# Patient Record
Sex: Male | Born: 1965
Health system: Southern US, Community
[De-identification: ages and names within clinical notes are randomized; demographics above are authoritative.]

## PROBLEM LIST (undated history)

## (undated) DIAGNOSIS — IMO0001 Reserved for inherently not codable concepts without codable children: Secondary | ICD-10-CM

## (undated) DIAGNOSIS — I739 Peripheral vascular disease, unspecified: Secondary | ICD-10-CM

## (undated) DIAGNOSIS — M1990 Unspecified osteoarthritis, unspecified site: Secondary | ICD-10-CM

## (undated) DIAGNOSIS — K219 Gastro-esophageal reflux disease without esophagitis: Secondary | ICD-10-CM

## (undated) DIAGNOSIS — I10 Essential (primary) hypertension: Secondary | ICD-10-CM

## (undated) DIAGNOSIS — T82898A Other specified complication of vascular prosthetic devices, implants and grafts, initial encounter: Secondary | ICD-10-CM

## (undated) DIAGNOSIS — T7840XA Allergy, unspecified, initial encounter: Secondary | ICD-10-CM

## (undated) DIAGNOSIS — K226 Gastro-esophageal laceration-hemorrhage syndrome: Secondary | ICD-10-CM

## (undated) DIAGNOSIS — F1910 Other psychoactive substance abuse, uncomplicated: Secondary | ICD-10-CM

## (undated) DIAGNOSIS — S82202A Unspecified fracture of shaft of left tibia, initial encounter for closed fracture: Secondary | ICD-10-CM

## (undated) DIAGNOSIS — S81839A Puncture wound without foreign body, unspecified lower leg, initial encounter: Secondary | ICD-10-CM

## (undated) DIAGNOSIS — K222 Esophageal obstruction: Secondary | ICD-10-CM

## (undated) DIAGNOSIS — W3400XA Accidental discharge from unspecified firearms or gun, initial encounter: Secondary | ICD-10-CM

## (undated) DIAGNOSIS — J45909 Unspecified asthma, uncomplicated: Secondary | ICD-10-CM

## (undated) HISTORY — PX: FEMUR FRACTURE SURGERY: SHX633

## (undated) HISTORY — DX: Other psychoactive substance abuse, uncomplicated: F19.10

## (undated) HISTORY — DX: Unspecified fracture of shaft of left tibia, initial encounter for closed fracture: S82.202A

## (undated) HISTORY — DX: Allergy, unspecified, initial encounter: T78.40XA

## (undated) HISTORY — DX: Other specified complication of vascular prosthetic devices, implants and grafts, initial encounter: T82.898A

## (undated) HISTORY — DX: Esophageal obstruction: K22.2

## (undated) HISTORY — PX: MYRINGOTOMY: SUR874

## (undated) HISTORY — PX: APPENDECTOMY: SHX54

## (undated) HISTORY — PX: UPPER GASTROINTESTINAL ENDOSCOPY: SHX188

## (undated) HISTORY — PX: TONSILLECTOMY: SUR1361

## (undated) HISTORY — DX: Puncture wound without foreign body, unspecified lower leg, initial encounter: S81.839A

## (undated) HISTORY — DX: Accidental discharge from unspecified firearms or gun, initial encounter: W34.00XA

---
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
## 1998-09-01 ENCOUNTER — Emergency Department (HOSPITAL_COMMUNITY): Admission: EM | Admit: 1998-09-01 | Discharge: 1998-09-01 | Payer: Self-pay | Admitting: Emergency Medicine

## 1998-09-02 ENCOUNTER — Encounter: Payer: Self-pay | Admitting: Emergency Medicine

## 2001-02-07 ENCOUNTER — Emergency Department (HOSPITAL_COMMUNITY): Admission: EM | Admit: 2001-02-07 | Discharge: 2001-02-07 | Payer: Self-pay

## 2001-06-07 ENCOUNTER — Emergency Department (HOSPITAL_COMMUNITY): Admission: EM | Admit: 2001-06-07 | Discharge: 2001-06-07 | Payer: Self-pay | Admitting: Emergency Medicine

## 2001-06-07 ENCOUNTER — Encounter: Payer: Self-pay | Admitting: Emergency Medicine

## 2001-06-23 ENCOUNTER — Emergency Department (HOSPITAL_COMMUNITY): Admission: EM | Admit: 2001-06-23 | Discharge: 2001-06-23 | Payer: Self-pay | Admitting: Emergency Medicine

## 2001-06-23 ENCOUNTER — Encounter: Payer: Self-pay | Admitting: Emergency Medicine

## 2002-05-30 ENCOUNTER — Encounter: Payer: Self-pay | Admitting: Emergency Medicine

## 2002-05-30 ENCOUNTER — Emergency Department (HOSPITAL_COMMUNITY): Admission: EM | Admit: 2002-05-30 | Discharge: 2002-05-30 | Payer: Self-pay | Admitting: Emergency Medicine

## 2003-04-12 ENCOUNTER — Inpatient Hospital Stay (HOSPITAL_COMMUNITY): Admission: EM | Admit: 2003-04-12 | Discharge: 2003-04-14 | Payer: Self-pay | Admitting: Emergency Medicine

## 2003-04-15 ENCOUNTER — Emergency Department (HOSPITAL_COMMUNITY): Admission: EM | Admit: 2003-04-15 | Discharge: 2003-04-15 | Payer: Self-pay | Admitting: Emergency Medicine

## 2008-02-20 ENCOUNTER — Emergency Department (HOSPITAL_COMMUNITY): Admission: EM | Admit: 2008-02-20 | Discharge: 2008-02-20 | Payer: Self-pay | Admitting: Emergency Medicine

## 2009-03-15 ENCOUNTER — Emergency Department (HOSPITAL_COMMUNITY): Admission: EM | Admit: 2009-03-15 | Discharge: 2009-03-15 | Payer: Self-pay | Admitting: Emergency Medicine

## 2010-06-14 ENCOUNTER — Emergency Department (HOSPITAL_COMMUNITY)
Admission: EM | Admit: 2010-06-14 | Discharge: 2010-06-14 | Disposition: A | Discharge status: Home / Self Care | Payer: Self-pay | Attending: Emergency Medicine | Admitting: Emergency Medicine

## 2010-06-14 ENCOUNTER — Emergency Department (HOSPITAL_COMMUNITY): Payer: Self-pay

## 2010-06-14 DIAGNOSIS — Y92009 Unspecified place in unspecified non-institutional (private) residence as the place of occurrence of the external cause: Secondary | ICD-10-CM | POA: Insufficient documentation

## 2010-06-14 DIAGNOSIS — X500XXA Overexertion from strenuous movement or load, initial encounter: Secondary | ICD-10-CM | POA: Insufficient documentation

## 2010-06-14 DIAGNOSIS — M25569 Pain in unspecified knee: Secondary | ICD-10-CM | POA: Insufficient documentation

## 2010-06-14 DIAGNOSIS — S8990XA Unspecified injury of unspecified lower leg, initial encounter: Secondary | ICD-10-CM | POA: Insufficient documentation

## 2010-06-14 DIAGNOSIS — S99929A Unspecified injury of unspecified foot, initial encounter: Secondary | ICD-10-CM | POA: Insufficient documentation

## 2010-08-14 LAB — COMPREHENSIVE METABOLIC PANEL
ALT: 22 U/L (ref 0–53)
AST: 22 U/L (ref 0–37)
Albumin: 3.8 g/dL (ref 3.5–5.2)
Alkaline Phosphatase: 77 U/L (ref 39–117)
BUN: 6 mg/dL (ref 6–23)
CO2: 21 mEq/L (ref 19–32)
Calcium: 9.3 mg/dL (ref 8.4–10.5)
Chloride: 110 mEq/L (ref 96–112)
Creatinine, Ser: 0.91 mg/dL (ref 0.4–1.5)
GFR calc Af Amer: >60 mL/min (ref >60)
GFR calc non Af Amer: >60 mL/min (ref >60)
Glucose, Bld: 104 mg/dL — ABNORMAL HIGH (ref 70–99)
Potassium: 3.9 mEq/L (ref 3.5–5.1)
Sodium: 139 mEq/L (ref 135–145)
Total Bilirubin: 0.3 mg/dL (ref 0.3–1.2)
Total Protein: 7 g/dL (ref 6.0–8.3)

## 2010-08-14 LAB — CBC
HCT: 46.8 % (ref 39.0–52.0)
Hemoglobin: 16.3 g/dL (ref 13.0–17.0)
MCHC: 34.8 g/dL (ref 30.0–36.0)
MCV: 90.5 fL (ref 78.0–100.0)
Platelets: 321 10*3/uL (ref 150–400)
RBC: 5.18 MIL/uL (ref 4.22–5.81)
RDW: 13.5 % (ref 11.5–15.5)
WBC: 11.6 10*3/uL — ABNORMAL HIGH (ref 4.0–10.5)

## 2010-08-14 LAB — DIFFERENTIAL
Basophils Absolute: 0.1 10*3/uL (ref 0.0–0.1)
Basophils Relative: 1 % (ref 0–1)
Eosinophils Absolute: 0.9 10*3/uL — ABNORMAL HIGH (ref 0.0–0.7)
Eosinophils Relative: 8 % — ABNORMAL HIGH (ref 0–5)
Lymphocytes Relative: 30 % (ref 12–46)
Lymphs Abs: 3.4 10*3/uL (ref 0.7–4.0)
Monocytes Absolute: 1.1 10*3/uL — ABNORMAL HIGH (ref 0.1–1.0)
Monocytes Relative: 9 % (ref 3–12)
Neutro Abs: 6.1 10*3/uL (ref 1.7–7.7)
Neutrophils Relative %: 53 % (ref 43–77)

## 2010-08-14 LAB — LIPASE, BLOOD: Lipase: 18 U/L (ref 11–59)

## 2010-09-09 ENCOUNTER — Emergency Department (HOSPITAL_COMMUNITY)
Admission: EM | Admit: 2010-09-09 | Discharge: 2010-09-09 | Disposition: A | Discharge status: Home / Self Care | Payer: Self-pay | Attending: Emergency Medicine | Admitting: Emergency Medicine

## 2010-09-09 DIAGNOSIS — X500XXA Overexertion from strenuous movement or load, initial encounter: Secondary | ICD-10-CM | POA: Insufficient documentation

## 2010-09-09 DIAGNOSIS — M25469 Effusion, unspecified knee: Secondary | ICD-10-CM | POA: Insufficient documentation

## 2010-09-09 DIAGNOSIS — M25569 Pain in unspecified knee: Secondary | ICD-10-CM | POA: Insufficient documentation

## 2010-09-09 DIAGNOSIS — I10 Essential (primary) hypertension: Secondary | ICD-10-CM | POA: Insufficient documentation

## 2010-09-27 NOTE — H&P (Signed)
NAME:  WILBERN, PENNYPACKER                           ACCOUNT NO.:  [id number]   MEDICAL RECORD NO.:  [id number]                   PATIENT TYPE:  EMS   LOCATION:  [facility]                                 FACILITY:  [facility]   PHYSICIAN:  Sherin Quarry, MD                   DATE OF BIRTH:  08/17/1965   DATE OF ADMISSION:  04/12/2003  DATE OF DISCHARGE:                                HISTORY & PHYSICAL   HISTORY OF PRESENT ILLNESS:  Vincent Clarke is a 45 year old man who reports  that he has recently been under a lot of stress due to his marital situation  and also many bills.  At about 6 p.m. the evening prior to the morning of  admission, the patient became angry and took an overdose of his wife's  Dilantin as well as a half a bottle of Tylenol PM.  It is not clear how much  Dilantin he took.  A Dilantin level was obtained and was negligible, but a  Tylenol level was measured 10 hours after the time that he theoretically  took the overdose and was 126.  If this was in fact 10 hours after the  ingestion, this level is considered toxic and fulfills the criteria for  initiating Mucomyst therapy, therefore, the patient was given his first dose  of Mucomyst in the emergency room.  He is admitted to receive the subsequent  17 doses.   PAST MEDICAL HISTORY:   MEDICINES:  None.   ILLNESSES:  None.   OPERATIONS:  The patient had a fracture left leg as a teenager and was  treated with traction for six weeks.   FAMILY HISTORY:  The patient's father died of COPD in Sep 24, 2000.  The mother has  a history of rheumatoid arthritis.  He has a sister who is in good health.   SOCIAL HISTORY:  He smokes one and a half packs of cigarettes per day.  He  drinks two to three beers some weekends.  He denies alcohol abuse.   REVIEW OF SYSTEMS:  HEAD:  He denies headache or dizziness.  EYES:  He  denies visual blurring or diplopia.  EARS, NOSE AND THROAT:  Denies earache,  sinus pain or sore throat.  CHEST:  Denies  coughing, wheezing or chronic  constipation.  CARDIOVASCULAR:  Denies orthopnea, PND or ankle edema.  GI:  He has occasional complaints of indigestion.  He denies vomiting, melena or  hematochezia.  GU:  He denies dysuria or urinary frequency, hesitancy or  nocturia.  RHEUMATOLOGIC:  He denies back pain or joint pain.  HEMATOLOGIC:  He denies easy bleeding or bruising.  NEUROLOGIC:  He denies history of  seizure or stroke.   PHYSICAL EXAMINATION:  GENERAL:  On physical exam, he is alert and  cooperative and is in no acute distress.  HEENT:  Exam is within normal limits.  CHEST:  The chest  is clear to auscultation.  BACK:  Examination of the back reveals no CVA or point tenderness.  CARDIOVASCULAR:  Exam reveals normal S1 and S2 without rubs, murmurs or  gallops.  ABDOMEN:  The abdomen is benign, minimal bowel sounds, without masses,  tenderness or organomegaly.  NEUROLOGIC/EXTREMITIES:  Neurologic testing and examination of extremities  are normal.   IMPRESSION:  1. Overdose of Tylenol requiring treatment.  2. Presumed depression.  3. History of left leg fracture.   PLAN:  The patient will be given 140 mg/kg of N-acetyl cysteine, or  Mucomyst, x1.  He will then be given 17 doses of 70 mg/kg of Mucomyst every  4 hours.  Psychiatric consult will be needed from Dr. Antonietta Breach to  evaluate suicide risk.                                                Sherin Quarry, MD    SY/MEDQ  D:  04/12/2003  T:  04/12/2003  Job:  [id number]

## 2010-09-27 NOTE — Discharge Summary (Signed)
NAME:  Vincent Clarke, Vincent Clarke                           ACCOUNT NO.:  [id number]   MEDICAL RECORD NO.:  [id number]                   PATIENT TYPE:  INP   LOCATION:  5734                                 FACILITY:  [facility]   PHYSICIAN:  Hollice Espy, M.D.            DATE OF BIRTH:  12-May-1966   DATE OF ADMISSION:  04/12/2003  DATE OF DISCHARGE:  04/14/2003                                 DISCHARGE SUMMARY   HOSPITAL SUMMARY:  This is a 45 year old white male with no past medical  history who presents to the emergency room after he tried to as a suicide  attempt take his wife's Dilantin and a bottle of Tylenol.  Dilantin level  was negligible, but his Tylenol level was 126 which was considered toxic.  The exact date of his consumption was not known, but after discussion with  poison control, the level could be considered toxic. He was given his first  dose of Mucomyst and it was felt that he would need to be on the round-the-  clock Mucomyst for a total of 17 doses every four hours.  The patient  initially had some earlier abdominal pain, but that resolved and through his  hospital course he continued to receive Mucomyst doses.  He had a sitter  waiting with him and the patient remained stable.  However, after discussion  with poison control, they were able to stop his  Mucomyst early.  In waiting  for psychiatry clearance, the patient decided that he could not wait any  longer and decided to leave against medical advice or actually against what  was allowed.  The patient's wife was with him and did not encourage him to  stay and he waited until nurses were not looking and soon the patient was  gone from the room.  At this point, nursing call me and informed me at  approximately 3 p.m. on the date of April 14, 2003.  The patient was not  stable from a medical standpoint given that he could be at risk for harming  himself and the police department was notified.  At that time since the  patient  had not been voluntarily committed by psychiatry, emergency  treatment papers were signed and notarized, then the police department was  notified for patient to be picked up.   ADDENDUM:  The police were able to pick up this patient and since given that  he was medically cleared, I think they initially brought him to the  emergency room.  He was then transferred to a mental health facility for  further psychiatric therapy.                                                Hollice Espy, M.D.    SKK/MEDQ  D:  04/30/2003  T:  05/01/2003  Job:  [id number]

## 2010-10-21 ENCOUNTER — Emergency Department (HOSPITAL_COMMUNITY): Payer: Self-pay

## 2010-10-21 ENCOUNTER — Emergency Department (HOSPITAL_COMMUNITY)
Admission: EM | Admit: 2010-10-21 | Discharge: 2010-10-21 | Disposition: A | Discharge status: Home / Self Care | Payer: Self-pay | Attending: Emergency Medicine | Admitting: Emergency Medicine

## 2010-10-21 DIAGNOSIS — M545 Low back pain, unspecified: Secondary | ICD-10-CM | POA: Insufficient documentation

## 2010-10-21 DIAGNOSIS — R209 Unspecified disturbances of skin sensation: Secondary | ICD-10-CM | POA: Insufficient documentation

## 2010-10-21 DIAGNOSIS — R109 Unspecified abdominal pain: Secondary | ICD-10-CM | POA: Insufficient documentation

## 2010-10-21 DIAGNOSIS — M79609 Pain in unspecified limb: Secondary | ICD-10-CM | POA: Insufficient documentation

## 2010-10-21 DIAGNOSIS — I10 Essential (primary) hypertension: Secondary | ICD-10-CM | POA: Insufficient documentation

## 2010-10-21 LAB — URINALYSIS, ROUTINE W REFLEX MICROSCOPIC
Bilirubin Urine: NEGATIVE
Glucose, UA: NEGATIVE mg/dL
Hgb urine dipstick: NEGATIVE
Ketones, ur: NEGATIVE mg/dL
Leukocytes, UA: NEGATIVE
Nitrite: NEGATIVE
Protein, ur: NEGATIVE mg/dL
Specific Gravity, Urine: 1.009 (ref 1.005–1.030)
Urobilinogen, UA: 0.2 mg/dL (ref 0.0–1.0)
pH: 7.5 (ref 5.0–8.0)

## 2010-12-15 ENCOUNTER — Emergency Department (HOSPITAL_COMMUNITY)
Admission: EM | Admit: 2010-12-15 | Discharge: 2010-12-15 | Disposition: A | Discharge status: Home / Self Care | Payer: Self-pay | Attending: Emergency Medicine | Admitting: Emergency Medicine

## 2010-12-15 ENCOUNTER — Emergency Department (HOSPITAL_COMMUNITY): Payer: Self-pay

## 2010-12-15 DIAGNOSIS — R071 Chest pain on breathing: Secondary | ICD-10-CM | POA: Insufficient documentation

## 2010-12-15 DIAGNOSIS — M545 Low back pain, unspecified: Secondary | ICD-10-CM | POA: Insufficient documentation

## 2010-12-15 DIAGNOSIS — R05 Cough: Secondary | ICD-10-CM | POA: Insufficient documentation

## 2010-12-15 DIAGNOSIS — R059 Cough, unspecified: Secondary | ICD-10-CM | POA: Insufficient documentation

## 2010-12-15 DIAGNOSIS — R51 Headache: Secondary | ICD-10-CM | POA: Insufficient documentation

## 2010-12-15 DIAGNOSIS — R062 Wheezing: Secondary | ICD-10-CM | POA: Insufficient documentation

## 2010-12-15 DIAGNOSIS — J9801 Acute bronchospasm: Secondary | ICD-10-CM | POA: Insufficient documentation

## 2010-12-15 DIAGNOSIS — J40 Bronchitis, not specified as acute or chronic: Secondary | ICD-10-CM | POA: Insufficient documentation

## 2010-12-15 DIAGNOSIS — I10 Essential (primary) hypertension: Secondary | ICD-10-CM | POA: Insufficient documentation

## 2011-01-27 ENCOUNTER — Emergency Department (HOSPITAL_COMMUNITY)
Admission: EM | Admit: 2011-01-27 | Discharge: 2011-01-27 | Disposition: A | Discharge status: Home / Self Care | Payer: Self-pay | Attending: Emergency Medicine | Admitting: Emergency Medicine

## 2011-01-27 DIAGNOSIS — M549 Dorsalgia, unspecified: Secondary | ICD-10-CM | POA: Insufficient documentation

## 2011-01-27 DIAGNOSIS — M5430 Sciatica, unspecified side: Secondary | ICD-10-CM | POA: Insufficient documentation

## 2011-01-27 DIAGNOSIS — I10 Essential (primary) hypertension: Secondary | ICD-10-CM | POA: Insufficient documentation

## 2011-02-10 LAB — DIFFERENTIAL
Basophils Absolute: 0.2 — ABNORMAL HIGH
Basophils Relative: 1
Eosinophils Absolute: 0.6
Eosinophils Relative: 3
Lymphocytes Relative: 19
Lymphs Abs: 3.7
Monocytes Absolute: 1.6 — ABNORMAL HIGH
Monocytes Relative: 8
Neutro Abs: 13.5 — ABNORMAL HIGH
Neutrophils Relative %: 69

## 2011-02-10 LAB — CBC
HCT: 49.2
Hemoglobin: 17
MCHC: 34.5
MCV: 89.3
Platelets: 347
RBC: 5.51
RDW: 13.2
WBC: 19.6 — ABNORMAL HIGH

## 2011-02-10 LAB — D-DIMER, QUANTITATIVE: D-Dimer, Quant: 0.29

## 2011-02-10 LAB — POCT I-STAT, CHEM 8
BUN: 3 — ABNORMAL LOW
Calcium, Ion: 1.14
Chloride: 109
Creatinine, Ser: 1
Glucose, Bld: 85
HCT: 50
Hemoglobin: 17
Potassium: 3.6
Sodium: 139
TCO2: 22

## 2011-02-10 LAB — POCT CARDIAC MARKERS
CKMB, poc: <1 — ABNORMAL LOW
CKMB, poc: <1 — ABNORMAL LOW
Myoglobin, poc: 56.2
Myoglobin, poc: 61
Troponin i, poc: <0.05
Troponin i, poc: <0.05

## 2011-03-02 ENCOUNTER — Emergency Department (HOSPITAL_COMMUNITY)
Admission: EM | Admit: 2011-03-02 | Discharge: 2011-03-02 | Disposition: A | Discharge status: Home / Self Care | Payer: Self-pay | Attending: Emergency Medicine | Admitting: Emergency Medicine

## 2011-03-02 ENCOUNTER — Emergency Department (HOSPITAL_COMMUNITY): Payer: Self-pay

## 2011-03-02 DIAGNOSIS — M25569 Pain in unspecified knee: Secondary | ICD-10-CM | POA: Insufficient documentation

## 2011-03-02 DIAGNOSIS — I10 Essential (primary) hypertension: Secondary | ICD-10-CM | POA: Insufficient documentation

## 2011-03-02 DIAGNOSIS — M25469 Effusion, unspecified knee: Secondary | ICD-10-CM | POA: Insufficient documentation

## 2011-03-08 ENCOUNTER — Emergency Department (HOSPITAL_COMMUNITY): Payer: Self-pay

## 2011-03-08 ENCOUNTER — Emergency Department (HOSPITAL_COMMUNITY)
Admission: EM | Admit: 2011-03-08 | Discharge: 2011-03-08 | Disposition: A | Discharge status: Home / Self Care | Payer: Self-pay | Attending: Emergency Medicine | Admitting: Emergency Medicine

## 2011-03-08 DIAGNOSIS — I10 Essential (primary) hypertension: Secondary | ICD-10-CM | POA: Insufficient documentation

## 2011-03-08 DIAGNOSIS — S0003XA Contusion of scalp, initial encounter: Secondary | ICD-10-CM | POA: Insufficient documentation

## 2011-03-08 DIAGNOSIS — S0280XA Fracture of other specified skull and facial bones, unspecified side, initial encounter for closed fracture: Secondary | ICD-10-CM | POA: Insufficient documentation

## 2011-03-08 DIAGNOSIS — M25469 Effusion, unspecified knee: Secondary | ICD-10-CM | POA: Insufficient documentation

## 2011-03-08 DIAGNOSIS — IMO0002 Reserved for concepts with insufficient information to code with codable children: Secondary | ICD-10-CM | POA: Insufficient documentation

## 2011-03-08 DIAGNOSIS — S02401A Maxillary fracture, unspecified, initial encounter for closed fracture: Secondary | ICD-10-CM | POA: Insufficient documentation

## 2011-03-08 DIAGNOSIS — S02400A Malar fracture unspecified, initial encounter for closed fracture: Secondary | ICD-10-CM | POA: Insufficient documentation

## 2011-03-08 DIAGNOSIS — Y92009 Unspecified place in unspecified non-institutional (private) residence as the place of occurrence of the external cause: Secondary | ICD-10-CM | POA: Insufficient documentation

## 2011-03-08 DIAGNOSIS — S1093XA Contusion of unspecified part of neck, initial encounter: Secondary | ICD-10-CM | POA: Insufficient documentation

## 2011-03-08 LAB — DIFFERENTIAL
Basophils Absolute: 0 10*3/uL (ref 0.0–0.1)
Basophils Relative: 0 % (ref 0–1)
Eosinophils Absolute: 0 10*3/uL (ref 0.0–0.7)
Eosinophils Relative: 0 % (ref 0–5)
Lymphocytes Relative: 16 % (ref 12–46)
Lymphs Abs: 3.4 10*3/uL (ref 0.7–4.0)
Monocytes Absolute: 2.3 10*3/uL — ABNORMAL HIGH (ref 0.1–1.0)
Monocytes Relative: 11 % (ref 3–12)
Neutro Abs: 15.6 10*3/uL — ABNORMAL HIGH (ref 1.7–7.7)
Neutrophils Relative %: 73 % (ref 43–77)

## 2011-03-08 LAB — CBC
HCT: 40.8 % (ref 39.0–52.0)
Hemoglobin: 14.8 g/dL (ref 13.0–17.0)
MCH: 31.5 pg (ref 26.0–34.0)
MCHC: 36.3 g/dL — ABNORMAL HIGH (ref 30.0–36.0)
MCV: 86.8 fL (ref 78.0–100.0)
Platelets: 353 10*3/uL (ref 150–400)
RBC: 4.7 MIL/uL (ref 4.22–5.81)
RDW: 13.7 % (ref 11.5–15.5)
WBC: 21.5 10*3/uL — ABNORMAL HIGH (ref 4.0–10.5)

## 2011-03-08 LAB — POCT I-STAT, CHEM 8
BUN: 5 mg/dL — ABNORMAL LOW (ref 6–23)
Calcium, Ion: 1.1 mmol/L — ABNORMAL LOW (ref 1.12–1.32)
Chloride: 105 mEq/L (ref 96–112)
Creatinine, Ser: 0.8 mg/dL (ref 0.50–1.35)
Glucose, Bld: 102 mg/dL — ABNORMAL HIGH (ref 70–99)
HCT: 45 % (ref 39.0–52.0)
Hemoglobin: 15.3 g/dL (ref 13.0–17.0)
Potassium: 3 mEq/L — ABNORMAL LOW (ref 3.5–5.1)
Sodium: 142 mEq/L (ref 135–145)
TCO2: 21 mmol/L (ref 0–100)

## 2011-03-08 LAB — RAPID URINE DRUG SCREEN, HOSP PERFORMED
Amphetamines: NOT DETECTED
Barbiturates: NOT DETECTED
Benzodiazepines: NOT DETECTED
Cocaine: POSITIVE — AB
Opiates: POSITIVE — AB
Tetrahydrocannabinol: POSITIVE — AB

## 2011-03-08 MED ORDER — IOHEXOL 300 MG/ML  SOLN: 100.0000 mL | Freq: Once | INTRAMUSCULAR | Status: DC | PRN

## 2011-03-09 LAB — SAMPLE TO BLOOD BANK
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
## 2011-07-17 ENCOUNTER — Emergency Department (HOSPITAL_COMMUNITY)
Admission: EM | Admit: 2011-07-17 | Discharge: 2011-07-17 | Disposition: A | Discharge status: Home Health | Payer: Self-pay | Attending: Emergency Medicine | Admitting: Emergency Medicine

## 2011-07-17 ENCOUNTER — Emergency Department (HOSPITAL_COMMUNITY): Payer: Self-pay

## 2011-07-17 ENCOUNTER — Encounter (HOSPITAL_COMMUNITY): Payer: Self-pay

## 2011-07-17 DIAGNOSIS — Z79899 Other long term (current) drug therapy: Secondary | ICD-10-CM | POA: Insufficient documentation

## 2011-07-17 DIAGNOSIS — S199XXA Unspecified injury of neck, initial encounter: Secondary | ICD-10-CM | POA: Insufficient documentation

## 2011-07-17 DIAGNOSIS — H5710 Ocular pain, unspecified eye: Secondary | ICD-10-CM | POA: Insufficient documentation

## 2011-07-17 DIAGNOSIS — H538 Other visual disturbances: Secondary | ICD-10-CM | POA: Insufficient documentation

## 2011-07-17 DIAGNOSIS — IMO0002 Reserved for concepts with insufficient information to code with codable children: Secondary | ICD-10-CM | POA: Insufficient documentation

## 2011-07-17 DIAGNOSIS — I10 Essential (primary) hypertension: Secondary | ICD-10-CM | POA: Insufficient documentation

## 2011-07-17 DIAGNOSIS — S0510XA Contusion of eyeball and orbital tissues, unspecified eye, initial encounter: Secondary | ICD-10-CM | POA: Insufficient documentation

## 2011-07-17 DIAGNOSIS — S0993XA Unspecified injury of face, initial encounter: Secondary | ICD-10-CM | POA: Insufficient documentation

## 2011-07-17 HISTORY — DX: Essential (primary) hypertension: I10

## 2011-07-17 MED ORDER — OXYCODONE-ACETAMINOPHEN 5-325 MG PO TABS: 1.0000 | ORAL_TABLET | Freq: Three times a day (TID) | ORAL | Status: AC | PRN

## 2011-07-17 MED ORDER — OXYCODONE-ACETAMINOPHEN 5-325 MG PO TABS
1.0000 | ORAL_TABLET | Freq: Once | ORAL | Status: AC
  Administered 2011-07-17: 1 via ORAL
  Filled 2011-07-17: qty 1

## 2011-07-17 NOTE — ED Notes (Signed)
Pt states that he was helping his friend hang some shutters and it ended up slipping and hitting him in the right side of his head. No loc but pt states that he has a headache and his vision in the right eye feels blurry. Pt states that when walking backwards he also tripped and feels like he also injured his back. Alert and oriented. Aleve pta with no relief.
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
## 2011-09-08 ENCOUNTER — Emergency Department (HOSPITAL_COMMUNITY)
Admission: EM | Admit: 2011-09-08 | Discharge: 2011-09-08 | Disposition: A | Discharge status: Home / Self Care | Payer: Self-pay | Attending: Emergency Medicine | Admitting: Emergency Medicine

## 2011-09-08 ENCOUNTER — Emergency Department (HOSPITAL_COMMUNITY): Payer: Self-pay

## 2011-09-08 ENCOUNTER — Encounter (HOSPITAL_COMMUNITY): Payer: Self-pay | Admitting: Emergency Medicine

## 2011-09-08 DIAGNOSIS — W2209XA Striking against other stationary object, initial encounter: Secondary | ICD-10-CM | POA: Insufficient documentation

## 2011-09-08 DIAGNOSIS — T148XXA Other injury of unspecified body region, initial encounter: Secondary | ICD-10-CM | POA: Insufficient documentation

## 2011-09-08 DIAGNOSIS — M7989 Other specified soft tissue disorders: Secondary | ICD-10-CM | POA: Insufficient documentation

## 2011-09-08 DIAGNOSIS — Z79899 Other long term (current) drug therapy: Secondary | ICD-10-CM | POA: Insufficient documentation

## 2011-09-08 DIAGNOSIS — IMO0001 Reserved for inherently not codable concepts without codable children: Secondary | ICD-10-CM | POA: Insufficient documentation

## 2011-09-08 DIAGNOSIS — M549 Dorsalgia, unspecified: Secondary | ICD-10-CM | POA: Insufficient documentation

## 2011-09-08 DIAGNOSIS — M79609 Pain in unspecified limb: Secondary | ICD-10-CM | POA: Insufficient documentation

## 2011-09-08 DIAGNOSIS — I10 Essential (primary) hypertension: Secondary | ICD-10-CM | POA: Insufficient documentation

## 2011-09-08 DIAGNOSIS — R079 Chest pain, unspecified: Secondary | ICD-10-CM | POA: Insufficient documentation

## 2011-09-08 DIAGNOSIS — S9030XA Contusion of unspecified foot, initial encounter: Secondary | ICD-10-CM | POA: Insufficient documentation

## 2011-09-08 LAB — DIFFERENTIAL
Basophils Absolute: 0.1 10*3/uL (ref 0.0–0.1)
Basophils Relative: 0 % (ref 0–1)
Eosinophils Absolute: 0.2 10*3/uL (ref 0.0–0.7)
Eosinophils Relative: 2 % (ref 0–5)
Lymphocytes Relative: 27 % (ref 12–46)
Lymphs Abs: 3.1 10*3/uL (ref 0.7–4.0)
Monocytes Absolute: 0.7 10*3/uL (ref 0.1–1.0)
Monocytes Relative: 6 % (ref 3–12)
Neutro Abs: 7.4 10*3/uL (ref 1.7–7.7)
Neutrophils Relative %: 64 % (ref 43–77)

## 2011-09-08 LAB — CBC
HCT: 47.7 % (ref 39.0–52.0)
Hemoglobin: 16.7 g/dL (ref 13.0–17.0)
MCH: 30.3 pg (ref 26.0–34.0)
MCHC: 35 g/dL (ref 30.0–36.0)
MCV: 86.6 fL (ref 78.0–100.0)
Platelets: 397 10*3/uL (ref 150–400)
RBC: 5.51 MIL/uL (ref 4.22–5.81)
RDW: 13 % (ref 11.5–15.5)
WBC: 11.5 10*3/uL — ABNORMAL HIGH (ref 4.0–10.5)

## 2011-09-08 LAB — TROPONIN I: Troponin I: <0.3 ng/mL (ref <0.30)

## 2011-09-08 MED ORDER — IBUPROFEN 800 MG PO TABS
800.0000 mg | ORAL_TABLET | Freq: Once | ORAL | Status: AC
  Administered 2011-09-08: 800 mg via ORAL
  Filled 2011-09-08: qty 1

## 2011-09-08 MED ORDER — TRAMADOL HCL 50 MG PO TABS
50.0000 mg | ORAL_TABLET | Freq: Once | ORAL | Status: AC
  Administered 2011-09-08: 50 mg via ORAL
  Filled 2011-09-08: qty 1

## 2011-09-08 MED ORDER — METHOCARBAMOL 500 MG PO TABS: 500.0000 mg | ORAL_TABLET | Freq: Two times a day (BID) | ORAL | Status: AC

## 2011-09-08 MED ORDER — METHOCARBAMOL 500 MG PO TABS
1000.0000 mg | ORAL_TABLET | Freq: Once | ORAL | Status: AC
  Administered 2011-09-08: 1000 mg via ORAL
  Filled 2011-09-08: qty 2

## 2011-09-08 MED ORDER — TRAMADOL HCL 50 MG PO TABS: 50.0000 mg | ORAL_TABLET | Freq: Four times a day (QID) | ORAL | Status: AC | PRN

## 2011-09-08 MED ORDER — IBUPROFEN 600 MG PO TABS: 600.0000 mg | ORAL_TABLET | Freq: Four times a day (QID) | ORAL | Status: AC | PRN

## 2011-09-08 NOTE — Discharge Instructions (Signed)
Muscle Strain A muscle strain (pulled muscle) happens when a muscle is over-stretched. Recovery usually takes 5 to 6 weeks.  HOME CARE   Put ice on the injured area.   Put ice in a plastic bag.   Place a towel between your skin and the bag.   Leave the ice on for 15 to 20 minutes at a time, every hour for the first 2 days.   Do not use the muscle for several days or until your doctor says you can. Do not use the muscle if you have pain.   Wrap the injured area with an elastic bandage for comfort. Do not put it on too tightly.   Only take medicine as told by your doctor.   Warm up before exercise. This helps prevent muscle strains.  GET HELP RIGHT AWAY IF:  There is increased pain or puffiness (swelling) in the affected area. MAKE SURE YOU:   Understand these instructions.   Will watch your condition.   Will get help right away if you are not doing well or get worse.  Document Released: 02/05/2008 Document Revised: 04/17/2011 Document Reviewed: 02/05/2008 [facility] Patient Information 2012 [place], [place].  Foot Contusion A contusion is a deep bruise. Bruises happen when an injury causes bleeding under the skin. Signs of bruising include pain, puffiness (swelling), and discolored skin. The bruise may turn blue, purple, or yellow. A bruised foot may be painful to walk on. Puffiness may make it hard to move the foot. HOME CARE  Put ice on the injured area.   Put ice in a plastic bag.   Place a towel between your skin and the bag.   Leave the ice on for 15 to 20 minutes, 3 to 4 times a day.   Use an elastic bandage to keep the puffiness down.   Keep the foot raised (elevated) above the level of the heart.   Avoid standing or walking on the foot.   Only take medicine as told by your doctor.   Eat healthy.   See your doctor for a follow-up visit as told.  GET HELP RIGHT AWAY IF:   The pain and puffiness get worse.   You have a headache, muscle ache, or you feel  dizzy and ill.   The pain is not controlled with medicine.   You have a temperature by mouth above 102 F (38.9 C), not controlled by medicine.   The foot feels warm and it is painful to move your toes.   The bruise is not getting better.   There is yellowish white fluid (pus) coming from the wound.   You lose feeling (numbness) in the foot.   The foot feels cold.   There are new problems.  MAKE SURE YOU:   Understand these instructions.   Will watch this condition.   Will get help right away if you or your child is not doing well or gets worse.  Document Released: 02/05/2008 Document Revised: 04/17/2011 Document Reviewed: 04/01/2011 [facility] Patient Information 2012 [place], [place].

## 2011-09-08 NOTE — ED Notes (Signed)
Kicked door  Last sat night and hurt rt foot and  Has a cough x 3-4 days  Makes his chest hurt to cough

## 2011-09-08 NOTE — ED Notes (Signed)
Patient transported to X-ray.   EE

## 2011-09-08 NOTE — ED Notes (Signed)
Patient presents with right foot pain after an injury today. No major swelling and redness noted, patient ambulatory in department without difficulty, no distress noted.

## 2011-09-08 NOTE — ED Provider Notes (Signed)
History     CSN: [id number]  Arrival date & time 09/08/11  1156   First MD Initiated Contact with Patient 09/08/11 1356      Chief Complaint  Patient presents with  . Foot Pain    (Consider location/radiation/quality/duration/timing/severity/associated sxs/prior treatment) Patient is a 46 y.o. male presenting with lower extremity pain.  Foot Pain Associated symptoms include chest pain. Pertinent negatives include no abdominal pain, no headaches and no shortness of breath.   Pt with R chest and R back pain worse with movement, deep breathing and reproduced completely with palpation. Pain has been present for several days and admits to previous strenuous lifting. No fever chills, SOB, weakness. No N/V. Mild cough without sputum production  Pt also with R foot pain after kicking door. Contusion and swelling present Past Medical History  Diagnosis Date  . Back pain   . Hypertension     History reviewed. No pertinent past surgical history.  No family history on file.  History  Substance Use Topics  . Smoking status: Current Everyday Smoker -- 0.5 packs/day  . Smokeless tobacco: Not on file  . Alcohol Use: No      Review of Systems  Constitutional: Negative for fever and chills.  HENT: Negative for neck pain.   Respiratory: Negative for shortness of breath and wheezing.   Cardiovascular: Positive for chest pain. Negative for palpitations and leg swelling.  Gastrointestinal: Negative for nausea, vomiting and abdominal pain.  Musculoskeletal: Positive for myalgias and back pain.  Skin: Negative for pallor, rash and wound.  Neurological: Negative for dizziness, weakness, numbness and headaches.    Allergies  Review of patient's allergies indicates no known allergies.  Home Medications   Current Outpatient Rx  Name Route Sig Dispense Refill  . ALBUTEROL SULFATE HFA 108 (90 BASE) MCG/ACT IN AERS Inhalation Inhale 2 puffs into the lungs every 6 (six) hours as needed.  Shortness of breath    . LISINOPRIL 20 MG PO TABS Oral Take 20 mg by mouth daily.    Marland Kitchen OMEPRAZOLE MAGNESIUM 20 MG PO TBEC Oral Take 20 mg by mouth daily.    . IBUPROFEN 600 MG PO TABS Oral Take 1 tablet (600 mg total) by mouth every 6 (six) hours as needed for pain. 30 tablet 0  . METHOCARBAMOL 500 MG PO TABS Oral Take 1 tablet (500 mg total) by mouth 2 (two) times daily. 20 tablet 0  . TRAMADOL HCL 50 MG PO TABS Oral Take 1 tablet (50 mg total) by mouth every 6 (six) hours as needed for pain. 15 tablet 0    BP 130/86  Pulse 98  Temp(Src) 98.6 F (37 C) (Oral)  Resp 16  SpO2 98%  Physical Exam  Nursing note and vitals reviewed. Constitutional: He is oriented to person, place, and time. He appears well-developed and well-nourished. No distress.  HENT:  Head: Normocephalic and atraumatic.  Mouth/Throat: Oropharynx is clear and moist.  Eyes: EOM are normal. Pupils are equal, round, and reactive to light.  Neck: Normal range of motion. Neck supple.  Cardiovascular: Normal rate and regular rhythm.  Exam reveals no gallop and no friction rub.   No murmur heard. Pulmonary/Chest: Effort normal and breath sounds normal. No respiratory distress. He has no wheezes. He has no rales. He exhibits tenderness (Mild R pectoralis TTP).  Abdominal: Soft. Bowel sounds are normal. There is no tenderness. There is no rebound and no guarding.  Musculoskeletal: Normal range of motion. He exhibits tenderness (Pain reproduced completely  with palpation of R rhomboid muscles). He exhibits no edema.       Obvious R great toe and 2nd toe contusion. No evidence of deformity   Neurological: He is alert and oriented to person, place, and time.       5/5 motor, sensation intact  Skin: Skin is warm and dry. No rash noted. No erythema.  Psychiatric: He has a normal mood and affect. His behavior is normal.    ED Course  Procedures (including critical care time)  Labs Reviewed  CBC - Abnormal; Notable for the  following:    WBC 11.5 (*)    All other components within normal limits  DIFFERENTIAL  TROPONIN I   Dg Chest 2 View  09/08/2011  *RADIOLOGY REPORT*  Clinical Data: Chest pain.  CHEST - 2 VIEW  Comparison: 03/08/2011  Findings: Heart and mediastinal contours are within normal limits. No focal opacities or effusions.  No acute bony abnormality.  IMPRESSION: No active cardiopulmonary disease.  Original Report Authenticated By: Cyndie Chime, M.D.   Dg Foot Complete Right  09/08/2011  *RADIOLOGY REPORT*  Clinical Data: Pain at top of foot at the second toe metatarsal region  RIGHT FOOT COMPLETE - 3+ VIEW  Comparison: None  Findings: Joint spaces preserved. Osseous mineralization grossly normal for technique. No definite fracture, dislocation, or bone destruction.  IMPRESSION: No acute osseous abnormalities.  Original Report Authenticated By: Lollie Marrow, M.D.     1. Muscle strain   2. Foot contusion      Date: 09/08/2011  Rate: 99  Rhythm: normal sinus rhythm  QRS Axis: normal  Intervals: normal  ST/T Wave abnormalities: normal  Conduction Disutrbances:none  Narrative Interpretation:   Old EKG Reviewed: unchanged    MDM          Loren Racer, MD 09/08/11 1534

## 2011-09-09 LAB — POCT I-STAT, CHEM 8
BUN: 5 mg/dL — ABNORMAL LOW (ref 6–23)
Calcium, Ion: 1.28 mmol/L (ref 1.12–1.32)
Chloride: 110 mEq/L (ref 96–112)
Creatinine, Ser: 0.7 mg/dL (ref 0.50–1.35)
Glucose, Bld: 94 mg/dL (ref 70–99)
HCT: 50 % (ref 39.0–52.0)
Hemoglobin: 17 g/dL (ref 13.0–17.0)
Potassium: 3.5 mEq/L (ref 3.5–5.1)
Sodium: 140 mEq/L (ref 135–145)
TCO2: 21 mmol/L (ref 0–100)

## 2011-12-18 ENCOUNTER — Emergency Department (HOSPITAL_COMMUNITY): Payer: Self-pay

## 2011-12-18 ENCOUNTER — Encounter (HOSPITAL_COMMUNITY): Payer: Self-pay

## 2011-12-18 ENCOUNTER — Emergency Department (HOSPITAL_COMMUNITY)
Admission: EM | Admit: 2011-12-18 | Discharge: 2011-12-18 | Disposition: A | Discharge status: Home / Self Care | Payer: Self-pay | Attending: Emergency Medicine | Admitting: Emergency Medicine

## 2011-12-18 DIAGNOSIS — I10 Essential (primary) hypertension: Secondary | ICD-10-CM | POA: Insufficient documentation

## 2011-12-18 DIAGNOSIS — X500XXA Overexertion from strenuous movement or load, initial encounter: Secondary | ICD-10-CM | POA: Insufficient documentation

## 2011-12-18 DIAGNOSIS — M6580 Other synovitis and tenosynovitis, unspecified site: Secondary | ICD-10-CM | POA: Insufficient documentation

## 2011-12-18 DIAGNOSIS — F17200 Nicotine dependence, unspecified, uncomplicated: Secondary | ICD-10-CM | POA: Insufficient documentation

## 2011-12-18 DIAGNOSIS — Z79899 Other long term (current) drug therapy: Secondary | ICD-10-CM | POA: Insufficient documentation

## 2011-12-18 DIAGNOSIS — M779 Enthesopathy, unspecified: Secondary | ICD-10-CM

## 2011-12-18 MED ORDER — OXYCODONE-ACETAMINOPHEN 5-325 MG PO TABS
1.0000 | ORAL_TABLET | Freq: Once | ORAL | Status: AC
  Administered 2011-12-18: 1 via ORAL
  Filled 2011-12-18: qty 1

## 2011-12-18 MED ORDER — ETODOLAC 500 MG PO TABS: 500.0000 mg | ORAL_TABLET | Freq: Two times a day (BID) | ORAL | Status: DC

## 2011-12-18 MED ORDER — OXYCODONE-ACETAMINOPHEN 5-325 MG PO TABS: 1.0000 | ORAL_TABLET | Freq: Four times a day (QID) | ORAL | Status: AC | PRN

## 2011-12-18 NOTE — ED Notes (Signed)
Pt reports (R) arm pain x1 week while at work, pt reports increase pain w/movement.

## 2011-12-18 NOTE — ED Provider Notes (Signed)
History  Scribed for Celene Kras, MD, the patient was seen in room TR04C/TR04C. This chart was scribed by Candelaria Stagers. The patient's care started at 11:51 AM   CSN: [id number]  Arrival date & time 12/18/11  1010   First MD Initiated Contact with Patient 12/18/11 1144      Chief Complaint  Patient presents with  . Arm Pain     The history is provided by the patient.   SQUARE JOWETT is a 46 y.o. male who presents to the Emergency Department complaining of right forearm pain that started about one week ago after an injury at work and has gotten worse today after lifting a heavy object earlier this morning.  Pain is increased with movement.  He denies neck pain or any other sx.  Nothing seems to make the pain better or worse.   Past Medical History  Diagnosis Date  . Back pain   . Hypertension     History reviewed. No pertinent past surgical history.  History reviewed. No pertinent family history.  History  Substance Use Topics  . Smoking status: Current Everyday Smoker -- 0.5 packs/day  . Smokeless tobacco: Not on file  . Alcohol Use: No      Review of Systems  Constitutional: Negative for fever.       10 Systems reviewed and are negative for acute change except as noted in the HPI.  HENT: Negative for congestion.   Eyes: Negative for discharge and redness.  Respiratory: Negative for cough and shortness of breath.   Cardiovascular: Negative for chest pain.  Gastrointestinal: Negative for vomiting and abdominal pain.  Musculoskeletal: Positive for arthralgias (right forearm pain ). Negative for back pain.  Skin: Negative for rash.  Neurological: Negative for syncope, numbness and headaches.  Psychiatric/Behavioral:       No behavior change.    Allergies  Review of patient's allergies indicates no known allergies.  Home Medications   Current Outpatient Rx  Name Route Sig Dispense Refill  . ALBUTEROL SULFATE HFA 108 (90 BASE) MCG/ACT IN AERS Inhalation Inhale 2  puffs into the lungs every 6 (six) hours as needed. Shortness of breath    . IBUPROFEN 200 MG PO TABS Oral Take 200 mg by mouth every 6 (six) hours as needed. For pain.    Marland Kitchen LISINOPRIL 20 MG PO TABS Oral Take 20 mg by mouth daily.    Marland Kitchen OMEPRAZOLE MAGNESIUM 20 MG PO TBEC Oral Take 20 mg by mouth daily.      BP 137/99  Pulse 65  Temp 98.2 F (36.8 C) (Oral)  Resp 16  SpO2 98%  Physical Exam  Nursing note and vitals reviewed. Constitutional: He appears well-developed and well-nourished. No distress.  HENT:  Head: Normocephalic and atraumatic.  Right Ear: External ear normal.  Left Ear: External ear normal.  Eyes: Conjunctivae are normal. Right eye exhibits no discharge. Left eye exhibits no discharge. No scleral icterus.  Neck: Neck supple. No tracheal deviation present.  Cardiovascular: Normal rate.   Pulmonary/Chest: Effort normal. No stridor. No respiratory distress.  Musculoskeletal: He exhibits no edema.       Right shoulder: He exhibits no bony tenderness and no swelling.       Tenderness to the mid right forearm.  Pain with supination and pronation.  Pain with flexion.    Neurological: He is alert. Cranial nerve deficit: no gross deficits.  Skin: Skin is warm and dry. No rash noted.  Psychiatric: He has a normal  mood and affect.    ED Course  Procedures   DIAGNOSTIC STUDIES: Oxygen Saturation is 98% on room air, normal by my interpretation.    COORDINATION OF CARE:     Labs Reviewed - No data to display Dg Forearm Right  12/18/2011  *RADIOLOGY REPORT*  Clinical Data: Lifting injury, pain.  RIGHT FOREARM - 2 VIEW  Comparison: None  Findings: No acute bony abnormality.  Specifically, no fracture, subluxation, or dislocation.  Soft tissues are intact.  No joint effusion within the right elbow.  IMPRESSION: No acute bony abnormality.  Original Report Authenticated By: Cyndie Chime, M.D.     1. Tendonitis       MDM  Patient does not show any evidence of  neurovascular compromise. I suspect he has a tendinitis associated with his job activity. The patient will be prescribed medications for pain. I recommend followup with an orthopedic Dr. or primary care Dr. I personally performed the services described in this documentation, which was scribed in my presence.  The recorded information has been reviewed and considered.         Celene Kras, MD 12/18/11 [phone]

## 2011-12-18 NOTE — ED Notes (Signed)
Pt ambulated from x-ray to room. Pt sitting up in chair, respiration even and unlabored. Pt reports he has been experiencing right arm pain X 1 week, reports sometimes he feels a numbness and tingling feeling. 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

## 2012-01-01 ENCOUNTER — Emergency Department (HOSPITAL_COMMUNITY)
Admission: EM | Admit: 2012-01-01 | Discharge: 2012-01-01 | Disposition: A | Discharge status: Home / Self Care | Payer: Self-pay | Attending: Emergency Medicine | Admitting: Emergency Medicine

## 2012-01-01 ENCOUNTER — Encounter (HOSPITAL_COMMUNITY): Payer: Self-pay | Admitting: Emergency Medicine

## 2012-01-01 DIAGNOSIS — I10 Essential (primary) hypertension: Secondary | ICD-10-CM | POA: Insufficient documentation

## 2012-01-01 DIAGNOSIS — M545 Low back pain, unspecified: Secondary | ICD-10-CM | POA: Insufficient documentation

## 2012-01-01 DIAGNOSIS — F17200 Nicotine dependence, unspecified, uncomplicated: Secondary | ICD-10-CM | POA: Insufficient documentation

## 2012-01-01 DIAGNOSIS — Z79899 Other long term (current) drug therapy: Secondary | ICD-10-CM | POA: Insufficient documentation

## 2012-01-01 MED ORDER — OXYCODONE-ACETAMINOPHEN 5-325 MG PO TABS: ORAL_TABLET | ORAL | Status: AC

## 2012-01-01 NOTE — ED Notes (Signed)
Seen 14 days ago for lower back pain at [facility] ED after hurting back putting widows into a house. Received meds. Pt did improve. 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 Pt denies radiation to either leg. Points to left lower back for pain site at 10/10.

## 2012-01-01 NOTE — ED Provider Notes (Signed)
History     CSN: [id number]  Arrival date & time 01/01/12  1550   First MD Initiated Contact with Patient 01/01/12 1712      No chief complaint on file.   (Consider location/radiation/quality/duration/timing/severity/associated sxs/prior treatment) HPI  46 year old male in no acute distress complaining of pain to lower back after lifting a very heavy piece of appointment today. Patient took Motrin with no relief. Pain is located on the central lower back. Exacerbated by movement. It is 10 out of 10 sharp nonradiating. Patient denies any fever, change in bowel or bladder habits, history of IV drug use or cancer.  Past Medical History  Diagnosis Date  . Back pain   . Hypertension     No past surgical history on file.  No family history on file.  History  Substance Use Topics  . Smoking status: Passive Smoker -- 0.5 packs/day  . Smokeless tobacco: Not on file  . Alcohol Use: No      Review of Systems  Musculoskeletal: Positive for back pain.  All other systems reviewed and are negative.    Allergies  Review of patient's allergies indicates no known allergies.  Home Medications   Current Outpatient Rx  Name Route Sig Dispense Refill  . IBUPROFEN 200 MG PO TABS Oral Take 800 mg by mouth every 6 (six) hours as needed. For pain    . LISINOPRIL 20 MG PO TABS Oral Take 20 mg by mouth daily.    Marland Kitchen OMEPRAZOLE MAGNESIUM 20 MG PO TBEC Oral Take 20 mg by mouth daily.    . OXYCODONE-ACETAMINOPHEN 5-325 MG PO TABS  1 to 2 tabs PO q6hrs  PRN for pain 10 tablet 0    BP 132/86  Pulse 83  Temp 98.2 F (36.8 C) (Oral)  SpO2 96%  Physical Exam  Nursing note and vitals reviewed. Constitutional: He is oriented to person, place, and time. He appears well-developed and well-nourished. No distress.  HENT:  Head: Normocephalic.  Eyes: Conjunctivae and EOM are normal. Pupils are equal, round, and reactive to light.  Cardiovascular: Normal rate.   Pulmonary/Chest: Effort normal.    Musculoskeletal: Normal range of motion.       Reduced range of motion to spinal flexion. Straight leg raise negative bilaterally. Patient has diffuse tenderness to lumbar paraspinal musculature with no spasm. Strength is 5 out of 5x4 extremities and sensation is intact x4  Neurological: He is alert and oriented to person, place, and time.  Psychiatric: He has a normal mood and affect.    ED Course  Procedures (including critical care time)  Labs Reviewed - No data to display No results found.   1. Lumbago       MDM  Uncomplicated low-back pain I will advise the patient to learn proper lifting techniques and follow with primary care will also give him a referral to the orthopedist Dr. August Saucer for followup.  Pt verbalized understanding and agrees with care plan. Outpatient follow-up and return precautions given.  ]         Wynetta Emery, PA-C 01/01/12 1753

## 2012-01-01 NOTE — ED Provider Notes (Signed)
Medical screening examination/treatment/procedure(s) were performed by non-physician practitioner and as supervising physician I was immediately available for consultation/collaboration.    Nelia Shi, MD 01/01/12 1800

## 2012-08-20 ENCOUNTER — Encounter (HOSPITAL_COMMUNITY): Payer: Self-pay | Admitting: Cardiology

## 2012-08-20 ENCOUNTER — Emergency Department (HOSPITAL_COMMUNITY)
Admission: EM | Admit: 2012-08-20 | Discharge: 2012-08-20 | Disposition: A | Discharge status: Home / Self Care | Payer: Self-pay | Attending: Emergency Medicine | Admitting: Emergency Medicine

## 2012-08-20 DIAGNOSIS — Z79899 Other long term (current) drug therapy: Secondary | ICD-10-CM | POA: Insufficient documentation

## 2012-08-20 DIAGNOSIS — M545 Low back pain, unspecified: Secondary | ICD-10-CM | POA: Insufficient documentation

## 2012-08-20 DIAGNOSIS — I10 Essential (primary) hypertension: Secondary | ICD-10-CM | POA: Insufficient documentation

## 2012-08-20 MED ORDER — HYDROCODONE-ACETAMINOPHEN 5-325 MG PO TABS: 1.0000 | ORAL_TABLET | Freq: Four times a day (QID) | ORAL | Status: DC | PRN

## 2012-08-20 MED ORDER — OXYCODONE-ACETAMINOPHEN 5-325 MG PO TABS
1.0000 | ORAL_TABLET | Freq: Once | ORAL | Status: AC
  Administered 2012-08-20: 1 via ORAL
  Filled 2012-08-20: qty 1

## 2012-08-20 MED ORDER — NAPROXEN 500 MG PO TABS: 500.0000 mg | ORAL_TABLET | Freq: Two times a day (BID) | ORAL | Status: DC

## 2012-08-20 MED ORDER — METHOCARBAMOL 500 MG PO TABS: 500.0000 mg | ORAL_TABLET | Freq: Two times a day (BID) | ORAL | Status: DC

## 2012-08-20 MED ORDER — METHOCARBAMOL 500 MG PO TABS
500.0000 mg | ORAL_TABLET | Freq: Once | ORAL | Status: AC
  Administered 2012-08-20: 500 mg via ORAL
  Filled 2012-08-20: qty 1

## 2012-08-20 NOTE — ED Provider Notes (Signed)
Medical screening examination/treatment/procedure(s) were performed by non-physician practitioner and as supervising physician I was immediately available for consultation/collaboration.  Flint Melter, MD 08/20/12 [phone]

## 2012-08-20 NOTE — ED Provider Notes (Signed)
History     CSN: [id number]  Arrival date & time 08/20/12  0940   First MD Initiated Contact with Patient 08/20/12 1005      Chief Complaint  Patient presents with  . Back Pain    (Consider location/radiation/quality/duration/timing/severity/associated sxs/prior treatment) HPI Comments: Patient presents with a chief complaint of lower back pain.  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  He did not have any pain initially, but began having pain yesterday.  He reports that the pain has been coming in spasms.  Pain primarily located on the left side of the lower back and does not radiate.  He has been taking Tylenol without relief.  Patient is a 47 y.o. male presenting with back pain. The history is provided by the patient.  Back Pain Pain is:  Worse during the night Progression:  Worsening Relieved by:  Nothing Worsened by:  Twisting and bending Associated symptoms: no bladder incontinence, no bowel incontinence, no fever, no leg pain, no numbness, no paresthesias, no perianal numbness, no tingling and no weakness   Risk factors: no hx of cancer     Past Medical History  Diagnosis Date  . Back pain   . Hypertension     History reviewed. No pertinent past surgical history.  History reviewed. No pertinent family history.  History  Substance Use Topics  . Smoking status: Passive Smoke Exposure - Never Smoker -- 0.50 packs/day  . Smokeless tobacco: Not on file  . Alcohol Use: No      Review of Systems  Constitutional: Negative for fever.  Gastrointestinal: Negative for bowel incontinence.  Genitourinary: Negative for bladder incontinence.  Musculoskeletal: Positive for back pain.  Neurological: Negative for tingling, weakness, numbness and paresthesias.  All other systems reviewed and are negative.    Allergies  Review of patient's allergies indicates no known allergies.  Home Medications   Current Outpatient Rx   Name  Route  Sig  Dispense  Refill  . ibuprofen (ADVIL,MOTRIN) 200 MG tablet   Oral   Take 600 mg by mouth every 6 (six) hours as needed. For pain         . lisinopril (PRINIVIL,ZESTRIL) 40 MG tablet   Oral   Take 40 mg by mouth daily.         . naproxen sodium (ANAPROX) 220 MG tablet   Oral   Take 220 mg by mouth 2 (two) times daily with a meal.         . omeprazole (PRILOSEC OTC) 20 MG tablet   Oral   Take 20 mg by mouth daily.           BP 103/48  Pulse 101  Temp(Src) 98.1 F (36.7 C) (Oral)  Resp 20  SpO2 98%  Physical Exam  Nursing note and vitals reviewed. Constitutional: He is oriented to person, place, and time. He appears well-developed and well-nourished. No distress.  HENT:  Head: Normocephalic and atraumatic.  Eyes: No scleral icterus.  Neck: Normal range of motion and full passive range of motion without pain. Neck supple. No spinous process tenderness and no muscular tenderness present. No rigidity. Normal range of motion present.  Cardiovascular: Normal rate, regular rhythm and intact distal pulses.   Intact distal pulses.  Pulmonary/Chest: Effort normal and breath sounds normal. No respiratory distress. He has no wheezes. He has no rales. He exhibits no tenderness.  Musculoskeletal:  Bilateral lower extremities nontender without color change,  baseline range of motion of extremities with Pt has increased pain w ROM of lumbar spine. Pain w ambulation. Negative straight leg test bilaterally.   Neurological: He is alert and oriented to person, place, and time. He has normal strength and normal reflexes. No sensory deficit. Gait normal.  Sensation at baseline for light touch in all 4 distal extremities, motor symmetric & bilateral 5/5 (hips: abduction, adduction, flexion; knee: flexion & extension; foot: dorsiflexion, plantar flexion, toes: dorsi flexion)  Skin: Skin is warm and dry. No rash noted. He is not diaphoretic. No erythema. No pallor.   Psychiatric: He has a normal mood and affect.    ED Course  Procedures (including critical care time)  Labs Reviewed - No data to display No results found.   No diagnosis found.    MDM  Patient with back pain.  No neurological deficits and normal neuro exam.  Patient can walk but states is painful.  No loss of bowel or bladder control.  No concern for cauda equina.  No fever, night sweats, weight loss, h/o cancer, IVDU.  RICE protocol and pain medicine indicated and discussed with patient.          Pascal Lux Herrin, PA-C 08/20/12 1536

## 2012-08-20 NOTE — ED Notes (Signed)
Pt reports chronic lower back pain that has increased over the past couple of days. States he has nerve damage in his back. Denies any recent injury. Pt ambulatory at triage.

## 2012-08-20 NOTE — ED Notes (Addendum)
Pt reports hx of low back pain. States was told he has nerve damage in his back previously. 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 States difficulty sleeping overnight due to pain and spasm. Denies bowel, bladder incontinence. Pt ambulatory with limp.

## 2012-09-22 ENCOUNTER — Encounter (HOSPITAL_COMMUNITY): Payer: Self-pay | Admitting: Emergency Medicine

## 2012-09-22 ENCOUNTER — Emergency Department (HOSPITAL_COMMUNITY): Payer: Self-pay

## 2012-09-22 ENCOUNTER — Emergency Department (HOSPITAL_COMMUNITY)
Admission: EM | Admit: 2012-09-22 | Discharge: 2012-09-22 | Disposition: A | Discharge status: Home / Self Care | Payer: Self-pay | Attending: Emergency Medicine | Admitting: Emergency Medicine

## 2012-09-22 DIAGNOSIS — M545 Low back pain, unspecified: Secondary | ICD-10-CM

## 2012-09-22 DIAGNOSIS — F17200 Nicotine dependence, unspecified, uncomplicated: Secondary | ICD-10-CM | POA: Insufficient documentation

## 2012-09-22 DIAGNOSIS — IMO0002 Reserved for concepts with insufficient information to code with codable children: Secondary | ICD-10-CM | POA: Insufficient documentation

## 2012-09-22 DIAGNOSIS — Y939 Activity, unspecified: Secondary | ICD-10-CM | POA: Insufficient documentation

## 2012-09-22 DIAGNOSIS — R079 Chest pain, unspecified: Secondary | ICD-10-CM | POA: Insufficient documentation

## 2012-09-22 DIAGNOSIS — Y929 Unspecified place or not applicable: Secondary | ICD-10-CM | POA: Insufficient documentation

## 2012-09-22 DIAGNOSIS — W11XXXA Fall on and from ladder, initial encounter: Secondary | ICD-10-CM | POA: Insufficient documentation

## 2012-09-22 DIAGNOSIS — Z79899 Other long term (current) drug therapy: Secondary | ICD-10-CM | POA: Insufficient documentation

## 2012-09-22 DIAGNOSIS — I10 Essential (primary) hypertension: Secondary | ICD-10-CM | POA: Insufficient documentation

## 2012-09-22 DIAGNOSIS — R55 Syncope and collapse: Secondary | ICD-10-CM | POA: Insufficient documentation

## 2012-09-22 LAB — BASIC METABOLIC PANEL
BUN: 6 mg/dL (ref 6–23)
CO2: 26 mEq/L (ref 19–32)
Calcium: 9.1 mg/dL (ref 8.4–10.5)
Chloride: 109 mEq/L (ref 96–112)
Creatinine, Ser: 0.82 mg/dL (ref 0.50–1.35)
GFR calc Af Amer: >90 mL/min (ref >90)
GFR calc non Af Amer: >90 mL/min (ref >90)
Glucose, Bld: 98 mg/dL (ref 70–99)
Potassium: 3.8 mEq/L (ref 3.5–5.1)
Sodium: 144 mEq/L (ref 135–145)

## 2012-09-22 LAB — CBC
HCT: 41.5 % (ref 39.0–52.0)
Hemoglobin: 14.6 g/dL (ref 13.0–17.0)
MCH: 30.2 pg (ref 26.0–34.0)
MCHC: 35.2 g/dL (ref 30.0–36.0)
MCV: 85.9 fL (ref 78.0–100.0)
Platelets: 362 10*3/uL (ref 150–400)
RBC: 4.83 MIL/uL (ref 4.22–5.81)
RDW: 13.1 % (ref 11.5–15.5)
WBC: 10.9 10*3/uL — ABNORMAL HIGH (ref 4.0–10.5)

## 2012-09-22 LAB — POCT I-STAT TROPONIN I: Troponin i, poc: 0 ng/mL (ref 0.00–0.08)

## 2012-09-22 MED ORDER — HYDROMORPHONE HCL PF 1 MG/ML IJ SOLN
1.0000 mg | Freq: Once | INTRAMUSCULAR | Status: AC
  Administered 2012-09-22: 1 mg via INTRAVENOUS
  Filled 2012-09-22: qty 1

## 2012-09-22 MED ORDER — ONDANSETRON HCL 4 MG/2ML IJ SOLN
4.0000 mg | Freq: Once | INTRAMUSCULAR | Status: AC
  Administered 2012-09-22: 4 mg via INTRAVENOUS
  Filled 2012-09-22: qty 2

## 2012-09-22 MED ORDER — ASPIRIN 81 MG PO CHEW
324.0000 mg | CHEWABLE_TABLET | Freq: Once | ORAL | Status: AC
  Administered 2012-09-22: 324 mg via ORAL
  Filled 2012-09-22: qty 4

## 2012-09-22 MED ORDER — TRAMADOL HCL 50 MG PO TABS: 50.0000 mg | ORAL_TABLET | Freq: Four times a day (QID) | ORAL | Status: DC | PRN

## 2012-09-22 MED ORDER — NAPROXEN 500 MG PO TABS: 500.0000 mg | ORAL_TABLET | Freq: Two times a day (BID) | ORAL | Status: DC

## 2012-09-22 NOTE — ED Provider Notes (Signed)
History     CSN: [id number]  Arrival date & time 09/22/12  1234   First MD Initiated Contact with Patient 09/22/12 1326      Chief Complaint  Patient presents with  . Chest Pain  . Fall    (Consider location/radiation/quality/duration/timing/severity/associated sxs/prior treatment) HPI Comments: Patient is a 47 year old male with a past medical history of hypertension who presents with chest pain that started around 11:30am this morning. Chest pain started suddenly and progressively worsened since the onset. The pain is located in his left chest and does not radiate. The pain is described as a "pressure" and is severe. Patient reports pre-syncopal episode when chest pain started. The pain lasted about 1 hour before spontaneously resolving without intervention. No aggravating/alleviating factors. Patient smokes 1 pack of cigarettes per day and denies family history of cardiac disease.   Patient is a 47 y.o. male presenting with chest pain and fall.  Chest Pain Fall    Past Medical History  Diagnosis Date  . Back pain   . Hypertension     History reviewed. No pertinent past surgical history.  History reviewed. No pertinent family history.  History  Substance Use Topics  . Smoking status: Current Every Day Smoker -- 0.50 packs/day  . Smokeless tobacco: Not on file  . Alcohol Use: Yes     Comment: occ      Review of Systems  Cardiovascular: Positive for chest pain.  Neurological: Positive for syncope.  All other systems reviewed and are negative.    Allergies  Review of patient's allergies indicates no known allergies.  Home Medications   Current Outpatient Rx  Name  Route  Sig  Dispense  Refill  . ibuprofen (ADVIL,MOTRIN) 200 MG tablet   Oral   Take 600 mg by mouth every 6 (six) hours as needed. For pain         . lisinopril (PRINIVIL,ZESTRIL) 40 MG tablet   Oral   Take 40 mg by mouth daily.         . naproxen sodium (ANAPROX) 220 MG tablet   Oral   Take 220 mg by mouth 2 (two) times daily with a meal.         . omeprazole (PRILOSEC OTC) 20 MG tablet   Oral   Take 20 mg by mouth daily.           BP 118/84  Pulse 66  Temp(Src) 98.2 F (36.8 C) (Oral)  Resp 14  SpO2 99%  Physical Exam  Nursing note and vitals reviewed. Constitutional: He is oriented to person, place, and time. He appears well-developed and well-nourished. No distress.  HENT:  Head: Normocephalic and atraumatic.  Eyes: Conjunctivae and EOM are normal.  Neck: Normal range of motion.  Cardiovascular: Normal rate and regular rhythm.  Exam reveals no gallop and no friction rub.   No murmur heard. Pulmonary/Chest: Effort normal and breath sounds normal. He has no wheezes. He has no rales. He exhibits no tenderness.  Abdominal: Soft. He exhibits no distension. There is no tenderness. There is no rebound and no guarding.  Musculoskeletal: Normal range of motion.  Neurological: He is alert and oriented to person, place, and time.  Speech is goal-oriented. Moves limbs without ataxia.   Skin: Skin is warm and dry.  Psychiatric: He has a normal mood and affect. His behavior is normal.    ED Course  Procedures (including critical care time)   Date: 09/22/2012  Rate: 71  Rhythm: normal sinus  rhythm  QRS Axis: left  Intervals: normal  ST/T Wave abnormalities: normal  Conduction Disutrbances:none  Narrative Interpretation: NSR without changes from previous   Old EKG Reviewed: none available and unchanged    Labs Reviewed  CBC - Abnormal; Notable for the following:    WBC 10.9 (*)    All other components within normal limits  BASIC METABOLIC PANEL  POCT I-STAT TROPONIN I   Dg Chest 2 View  09/22/2012   *RADIOLOGY REPORT*  Clinical Data: Chest pain, fall  CHEST - 2 VIEW  Comparison: 09/08/2011  Findings: Cardiomediastinal silhouette is stable.  No acute infiltrate or pleural effusion.  No pulmonary edema.  Mild thoracic dextroscoliosis. Mild degenerative  changes thoracic spine.  IMPRESSION: No active disease.  Mild thoracic dextroscoliosis.   Original Report Authenticated By: Natasha Mead, M.D.     1. Nonspecific chest pain   2. Low back pain       MDM  2:00 PM Patient is chest pain free now. He will have ASA 321mg . Chest xray and labs unremarkable for acute changes. Troponin negative. I will consult hospitalist    3:01 PM Patient decided he would like to leave without further evaluation. Patient understands the risks of not staying. Patient says he will return with worsening or concerning symptoms. Patient will be discharged with medication for his back pain. Vitals stable and patient afebrile.      Emilia Beck, PA-C 09/22/12 1504

## 2012-09-22 NOTE — ED Provider Notes (Signed)
Medical screening examination/treatment/procedure(s) were conducted as a shared visit with non-physician practitioner(s) and myself.  I personally evaluated the patient during the encounter Osvaldo Human, MD Physician Signed Emergency Medicine Progress Notes Service date: 09/22/2012 2:54 PM   47 yo man was at work, putting up a window in [place], [place]. He felt hot, had chest pain, sick, and fell from the ladder onto his buttock, hurting his back. He therefore sought evaluation. There is no history of coronary artery disease. He does have a history of low back pain. Exam now shows him to be complaining only of back pain. Heart and lungs WNL. Back shows no deformity or point tenderness. He is neurologicallly intact. I advised him that we generally admitted people with chest pain for overnight observation, but he demurred, saying that he lived close by on [facility] and if his chest pain recurred he would come right back. Rx for low back pain. Released.     Carleene Cooper III, MD 09/22/12 [phone]

## 2012-09-22 NOTE — ED Notes (Signed)
Pt sts having left sided CP today; pt sts had near syncopal episode and fell off ladder; pt c/o left lower back pain

## 2012-09-22 NOTE — ED Provider Notes (Signed)
2:51 PM  Date: 09/22/2012  Rate: 71  Rhythm: normal sinus rhythm  QRS Axis: normal  Intervals: normal  ST/T Wave abnormalities: normal  Conduction Disutrbances:none  Narrative Interpretation: Normal EKG  Old EKG Reviewed: unchanged    Carleene Cooper III, MD 09/22/12 1452

## 2012-09-22 NOTE — Progress Notes (Signed)
47 yo man was at work, putting up a window in [place], [place].  He felt hot, had chest pain, sick, and fell from the ladder onto his buttock, hurting his back. He therefore sought evaluation.  There is no history of coronary artery disease.  He does have a history of low back pain.  Exam now shows him to be complaining only of back pain.  Heart and lungs WNL.  Back shows no deformity or point tenderness.  He is neurologicallly intact.  I advised him that we generally admitted people with chest pain for overnight observation, but he demurred, saying that he lived close by on [facility] and if his chest pain recurred he would come right back.  Rx for low back pain.  Released.

## 2013-03-31 ENCOUNTER — Encounter (HOSPITAL_COMMUNITY): Payer: Self-pay | Admitting: Emergency Medicine

## 2013-03-31 ENCOUNTER — Emergency Department (HOSPITAL_COMMUNITY): Payer: Self-pay

## 2013-03-31 ENCOUNTER — Emergency Department (HOSPITAL_COMMUNITY)
Admission: EM | Admit: 2013-03-31 | Discharge: 2013-03-31 | Disposition: A | Discharge status: Home / Self Care | Payer: Self-pay | Attending: Emergency Medicine | Admitting: Emergency Medicine

## 2013-03-31 DIAGNOSIS — S6990XA Unspecified injury of unspecified wrist, hand and finger(s), initial encounter: Secondary | ICD-10-CM | POA: Insufficient documentation

## 2013-03-31 DIAGNOSIS — Z79899 Other long term (current) drug therapy: Secondary | ICD-10-CM | POA: Insufficient documentation

## 2013-03-31 DIAGNOSIS — R209 Unspecified disturbances of skin sensation: Secondary | ICD-10-CM | POA: Insufficient documentation

## 2013-03-31 DIAGNOSIS — F17200 Nicotine dependence, unspecified, uncomplicated: Secondary | ICD-10-CM | POA: Insufficient documentation

## 2013-03-31 DIAGNOSIS — Y9289 Other specified places as the place of occurrence of the external cause: Secondary | ICD-10-CM | POA: Insufficient documentation

## 2013-03-31 DIAGNOSIS — M25522 Pain in left elbow: Secondary | ICD-10-CM

## 2013-03-31 DIAGNOSIS — W138XXA Fall from, out of or through other building or structure, initial encounter: Secondary | ICD-10-CM | POA: Insufficient documentation

## 2013-03-31 DIAGNOSIS — Y9389 Activity, other specified: Secondary | ICD-10-CM | POA: Insufficient documentation

## 2013-03-31 DIAGNOSIS — I10 Essential (primary) hypertension: Secondary | ICD-10-CM | POA: Insufficient documentation

## 2013-03-31 DIAGNOSIS — S59919A Unspecified injury of unspecified forearm, initial encounter: Secondary | ICD-10-CM | POA: Insufficient documentation

## 2013-03-31 DIAGNOSIS — S59909A Unspecified injury of unspecified elbow, initial encounter: Secondary | ICD-10-CM | POA: Insufficient documentation

## 2013-03-31 MED ORDER — IBUPROFEN 400 MG PO TABS
800.0000 mg | ORAL_TABLET | Freq: Once | ORAL | Status: AC
Start: 2013-03-31 — End: 2013-03-31
  Administered 2013-03-31: 800 mg via ORAL
  Filled 2013-03-31: qty 2

## 2013-03-31 MED ORDER — OXYCODONE-ACETAMINOPHEN 5-325 MG PO TABS
1.0000 | ORAL_TABLET | Freq: Once | ORAL | Status: AC
  Administered 2013-03-31: 1 via ORAL
  Filled 2013-03-31: qty 1

## 2013-03-31 MED ORDER — OXYCODONE-ACETAMINOPHEN 5-325 MG PO TABS: 1.0000 | ORAL_TABLET | Freq: Four times a day (QID) | ORAL | Status: DC | PRN

## 2013-03-31 MED ORDER — NAPROXEN 500 MG PO TABS: 500.0000 mg | ORAL_TABLET | Freq: Two times a day (BID) | ORAL | Status: DC

## 2013-03-31 NOTE — ED Provider Notes (Signed)
Medical screening examination/treatment/procedure(s) were performed by non-physician practitioner and as supervising physician I was immediately available for consultation/collaboration.   Celene Kras, MD 03/31/13 [phone]

## 2013-03-31 NOTE — ED Notes (Signed)
I gave the patient a large ice pack for his elbow.

## 2013-03-31 NOTE — ED Notes (Signed)
Per pt sts he fell off a roof this am. sts fell on his bottom and left elbow. Pt having left elbow pain. Denies hitting head or LOC.

## 2013-03-31 NOTE — ED Provider Notes (Signed)
CSN: [id number]     Arrival date & time 03/31/13  1341 History  This chart was scribed for non-physician practitioner Rhea Bleacher PA-C working with Celene Kras, MD by Leone Payor, ED Scribe. This patient was seen in room TR06C/TR06C and the patient's care was started at 1341.     Chief Complaint  Patient presents with  . Arm Injury    The history is provided by the patient. No language interpreter was used.    HPI Comments: Vincent Clarke is a 47 y.o. male who presents to the Emergency Department complaining of a fall that occurred this morning. 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 He slipped and grabbed the rain gutter before falling -- this helped slow his fall. Pt states he landed on his buttocks and left elbow on the border of the bare ground and a patio. Pt states his main concern is the constant, gradually left elbow pain. Pain was much less right after the fall. He reports having associated paresthesias to the left thumb and the tips of the fingers. His pain is worsened with flexion and and extension of the elbow. He denies numbness.   Past Medical History  Diagnosis Date  . Back pain   . Hypertension    History reviewed. No pertinent past surgical history. History reviewed. No pertinent family history. History  Substance Use Topics  . Smoking status: Current Every Day Smoker -- 0.50 packs/day  . Smokeless tobacco: Not on file  . Alcohol Use: Yes     Comment: occ    Review of Systems  Constitutional: Negative for activity change.  Musculoskeletal: Positive for arthralgias (left elbow pain). Negative for back pain, gait problem, joint swelling and neck pain.  Skin: Negative for wound.  Neurological: Negative for weakness and numbness (tingling).       Paresthesia to left thumb and tips of left fingers    Allergies  Review of patient's allergies indicates no known allergies.  Home Medications   Current Outpatient Rx   Name  Route  Sig  Dispense  Refill  . GuaiFENesin (CHEST CONGESTION RELIEF PO)   Oral   Take by mouth.         Marland Kitchen lisinopril (PRINIVIL,ZESTRIL) 40 MG tablet   Oral   Take 40 mg by mouth daily.         . naproxen sodium (ALEVE) 220 MG tablet   Oral   Take 440 mg by mouth daily as needed.         Marland Kitchen omeprazole (PRILOSEC OTC) 20 MG tablet   Oral   Take 20 mg by mouth daily.          BP 143/90  Pulse 88  Temp(Src) 98.1 F (36.7 C) (Oral)  Resp 16  Wt 168 lb 6.4 oz (76.386 kg)  SpO2 97% Physical Exam  Nursing note and vitals reviewed. Constitutional: He appears well-developed and well-nourished.  HENT:  Head: Normocephalic and atraumatic.  Eyes: Conjunctivae are normal.  Neck: Normal range of motion. Neck supple.  Cardiovascular: Normal rate and normal pulses.   Pulses:      Radial pulses are 2+ on the right side, and 2+ on the left side.  Pulmonary/Chest: Effort normal.  Abdominal: He exhibits no distension.  Musculoskeletal: He exhibits tenderness. He exhibits no edema.       Left shoulder: Normal.       Left elbow: He exhibits decreased range of motion.  He exhibits no swelling and no effusion. Tenderness found. Radial head, medial epicondyle, lateral epicondyle and olecranon process tenderness noted.       Left wrist: Normal.       Left upper arm: Normal.       Left forearm: Normal.  Tenderness all over the left elbow. Decreased ROM with flexion.   Neurological: He is alert. No sensory deficit.  LUE: Tingling of all finger tips and thumb.    Skin: Skin is warm and dry.  Psychiatric: He has a normal mood and affect.    ED Course  Procedures   DIAGNOSTIC STUDIES: Oxygen Saturation is 97% on RA, adequate by my interpretation.    COORDINATION OF CARE: 2:20 PM Will XRAY left elbow. Discussed treatment plan with pt at bedside and pt agreed to plan.   Labs Review Labs Reviewed - No data to display Imaging Review Dg Elbow Complete Left  03/31/2013    CLINICAL DATA:  Larey Seat off roof this morning landing on left elbow, severe elbow pain medially radiating up arm  EXAM: LEFT ELBOW - COMPLETE 3+ VIEW  COMPARISON:  None  FINDINGS: Dorsal soft tissue swelling.  Osseous mineralization normal.  Joint spaces preserved.  No acute fracture, dislocation or bone destruction.  No definite elbow joint effusion.  IMPRESSION: No acute osseous abnormalities.   Electronically Signed   By: Ulyses Southward M.D.   On: 03/31/2013 15:31    EKG Interpretation   None      Vital signs reviewed and are as follows: Filed Vitals:   03/31/13 1600  BP: 145/102  Pulse: 71  Temp:   Resp: 16   Patient received ibuprofen and percocet for pain control.  X-ray reviewed by myself. Patient informed of results. Sling provided by nurse.   4:03 PM Patient was counseled on RICE protocol and told to rest injury, use ice for no longer than 15 minutes every hour, compress the area, and elevate above the level of their heart as much as possible to reduce swelling.  Questions answered.  Patient verbalized understanding.    Orthopedic followup given if no improvement over the next week.  Patient counseled on use of narcotic pain medications. Counseled not to combine these medications with others containing tylenol. Urged not to drink alcohol, drive, or perform any other activities that requires focus while taking these medications. The patient verbalizes understanding and agrees with the plan.   MDM   1. Elbow pain, left    Patient with elbow pain after a fall. X-rays negative for fracture. I suspect that the patient is developing an elbow effusion causing gradually worsening pain. This is consistent with the fact that the patient had only minimal pain right after the fall. Mild tingling to fingertips, no distinct nerve distribution. No motor deficit of the hand or wrist. Extremity is neurovascularly intact. Conservative management with orthopedic followup if not improving  indicated.  I personally performed the services described in this documentation, which was scribed in my presence. The recorded information has been reviewed and is accurate.   Renne Crigler, PA-C 03/31/13 [phone]

## 2013-04-21 ENCOUNTER — Emergency Department (HOSPITAL_COMMUNITY): Payer: Self-pay

## 2013-04-21 ENCOUNTER — Emergency Department (HOSPITAL_COMMUNITY)
Admission: EM | Admit: 2013-04-21 | Discharge: 2013-04-21 | Disposition: A | Discharge status: Home / Self Care | Payer: Self-pay | Attending: Emergency Medicine | Admitting: Emergency Medicine

## 2013-04-21 ENCOUNTER — Encounter (HOSPITAL_COMMUNITY): Payer: Self-pay | Admitting: Emergency Medicine

## 2013-04-21 DIAGNOSIS — I10 Essential (primary) hypertension: Secondary | ICD-10-CM | POA: Insufficient documentation

## 2013-04-21 DIAGNOSIS — Z79899 Other long term (current) drug therapy: Secondary | ICD-10-CM | POA: Insufficient documentation

## 2013-04-21 DIAGNOSIS — K219 Gastro-esophageal reflux disease without esophagitis: Secondary | ICD-10-CM | POA: Insufficient documentation

## 2013-04-21 DIAGNOSIS — F17200 Nicotine dependence, unspecified, uncomplicated: Secondary | ICD-10-CM | POA: Insufficient documentation

## 2013-04-21 DIAGNOSIS — R209 Unspecified disturbances of skin sensation: Secondary | ICD-10-CM | POA: Insufficient documentation

## 2013-04-21 DIAGNOSIS — G8911 Acute pain due to trauma: Secondary | ICD-10-CM | POA: Insufficient documentation

## 2013-04-21 DIAGNOSIS — Z791 Long term (current) use of non-steroidal anti-inflammatories (NSAID): Secondary | ICD-10-CM | POA: Insufficient documentation

## 2013-04-21 DIAGNOSIS — M545 Low back pain, unspecified: Secondary | ICD-10-CM | POA: Insufficient documentation

## 2013-04-21 HISTORY — DX: Gastro-esophageal reflux disease without esophagitis: K21.9

## 2013-04-21 MED ORDER — HYDROCODONE-ACETAMINOPHEN 5-325 MG PO TABS: 1.0000 | ORAL_TABLET | Freq: Every evening | ORAL | Status: DC | PRN

## 2013-04-21 MED ORDER — METHOCARBAMOL 500 MG PO TABS: 500.0000 mg | ORAL_TABLET | Freq: Four times a day (QID) | ORAL | Status: DC | PRN

## 2013-04-21 MED ORDER — METHOCARBAMOL 500 MG PO TABS
1000.0000 mg | ORAL_TABLET | Freq: Once | ORAL | Status: AC
  Administered 2013-04-21: 1000 mg via ORAL
  Filled 2013-04-21: qty 2

## 2013-04-21 NOTE — ED Provider Notes (Signed)
CSN: [id number]     Arrival date & time 04/21/13  2595 History   First MD Initiated Contact with Patient 04/21/13 [phone]     No chief complaint on file.  (Consider location/radiation/quality/duration/timing/severity/associated sxs/prior Treatment) HPI Patient seen in the emergency department on November 20 for fall from roof presents today for worsening low back pain that began after the fall. States that he was having pain only in his elbow at that time he was seen in the elbow pain has since improved somewhat. The pain in his low back has gotten worse over the past 3-4 days and he now has numbness in the dorsal left foot it is constant.  Pain is worse with movement and walking and palpation. Denies fevers, abdominal pain, urinary symptoms, bowel or bladder incontinence, weakness of his legs. Patient has no known history of cancer and denies hx IV drug use.  Pt has hx back pain in the past, states it is in the same place and feels like it has flared up.    Past Medical History  Diagnosis Date  . Back pain   . Hypertension   . GERD (gastroesophageal reflux disease)    History reviewed. No pertinent past surgical history. History reviewed. No pertinent family history. History  Substance Use Topics  . Smoking status: Current Every Day Smoker -- 0.50 packs/day  . Smokeless tobacco: Not on file  . Alcohol Use: Yes     Comment: occ    Review of Systems  Constitutional: Negative for fever.  Respiratory: Negative for cough and shortness of breath.   Cardiovascular: Negative for chest pain and leg swelling.  Genitourinary: Negative for dysuria, urgency and frequency.  Musculoskeletal: Positive for back pain.  Neurological: Positive for numbness. Negative for weakness.    Allergies  Review of patient's allergies indicates no known allergies.  Home Medications   Current Outpatient Rx  Name  Route  Sig  Dispense  Refill  . GuaiFENesin (CHEST CONGESTION RELIEF PO)   Oral   Take by  mouth.         Marland Kitchen lisinopril (PRINIVIL,ZESTRIL) 40 MG tablet   Oral   Take 40 mg by mouth daily.         . naproxen (NAPROSYN) 500 MG tablet   Oral   Take 1 tablet (500 mg total) by mouth 2 (two) times daily.   20 tablet   0   . naproxen sodium (ALEVE) 220 MG tablet   Oral   Take 440 mg by mouth daily as needed.         Marland Kitchen omeprazole (PRILOSEC OTC) 20 MG tablet   Oral   Take 20 mg by mouth daily.         Marland Kitchen oxyCODONE-acetaminophen (PERCOCET/ROXICET) 5-325 MG per tablet   Oral   Take 1-2 tablets by mouth every 6 (six) hours as needed for severe pain.   15 tablet   0    BP 157/97  Pulse 86  Temp(Src) 98.1 F (36.7 C) (Oral)  Resp 18  Wt 175 lb (79.379 kg)  SpO2 99% Physical Exam  Nursing note and vitals reviewed. Constitutional: He appears well-developed and well-nourished. No distress.  HENT:  Head: Normocephalic and atraumatic.  Neck: Neck supple.  Pulmonary/Chest: Effort normal.  Abdominal: Soft. He exhibits no distension and no mass. There is no tenderness. There is no rebound and no guarding.  Musculoskeletal: He exhibits no edema.       Cervical back: He exhibits no bony tenderness.  Thoracic back: He exhibits no bony tenderness.       Back:  Lower extremities:  Strength 5/5, sensation intact, distal pulses intact.    Spine no crepitus, or stepoffs.  Straight leg raise negative.   Neurological: He is alert.  Skin: He is not diaphoretic.    ED Course  Procedures (including critical care time) Labs Review Labs Reviewed - No data to display Imaging Review Dg Lumbar Spine Complete  04/21/2013   CLINICAL DATA:  Fall from roof.  Low back and left leg pain.  EXAM: LUMBAR SPINE - COMPLETE 4+ VIEW  COMPARISON:  None.  FINDINGS: There is no evidence of lumbar spine fracture. Alignment is normal. Intervertebral disc spaces are maintained. Mild vertebral osteophytosis noted. No evidence of facet arthropathy. No other bone lesions identified. Mild  atherosclerotic calcification of abdominal aorta noted.  IMPRESSION: No acute findings.   Electronically Signed   By: Myles Rosenthal M.D.   On: 04/21/2013 09:26    EKG Interpretation   None       MDM   1. Low back pain     Pt with pain in left and central low back gradually worsening since fall from ladder weeks ago, significantly worse over past 3-4 days without new injury.  Small area of numbness identified over dorsal foot/anterior ankle.  No weakness. Xray unremarkable, no acute changes.  No red flags for back pain.  D/C home with robaxin, #6 norco qHS, PCP follow up.  Discussed all results with patient.  Pt given return precautions.  Pt verbalizes understanding and agrees with plan.     I doubt any other EMC precluding discharge at this time including, but not necessarily limited to the following: cauda equina, acute cord compression or significant nerve root compression, ruptured disk, AAA, fracture, tumor, or infection.      [place], PA-C 04/21/13 [phone]

## 2013-04-21 NOTE — ED Notes (Signed)
Pt states he fell off a roof about two weeks ago. He fell on his elbow and his back. After the injury, pt went to the ED to be evaluated. Since then, his left foot and lower leg has felt numb and he has started walking with a limp.

## 2013-04-21 NOTE — ED Provider Notes (Signed)
Medical screening examination/treatment/procedure(s) were conducted as a shared visit with non-physician practitioner(s) or resident and myself. I personally evaluated the patient during the encounter and agree with the findings and plan unless otherwise indicated.  I have personally reviewed any xrays and/ or EKG's with the provider and I agree with interpretation.  Pt fell off a roof 2 wks prior, gradually worsening left lower flank pain with numbness in L foot. No back surgeries in the past. No weakness. No b/b changes. No fevers. Pain with rom. Exam nl strength and sensation in major LE n bilateral except decr sensation in dorsum in foot. Nl reflexes. Left paraspinal muscle tightness/ tenderness.  Xray okay. Strict reasons to return given.  Lumbar strain, Radiculopathy   Enid Skeens, MD 04/21/13 [phone]

## 2013-12-04 ENCOUNTER — Encounter (HOSPITAL_COMMUNITY): Payer: Self-pay | Admitting: Emergency Medicine

## 2013-12-04 ENCOUNTER — Emergency Department (HOSPITAL_COMMUNITY)
Admission: EM | Admit: 2013-12-04 | Discharge: 2013-12-04 | Disposition: A | Discharge status: Home / Self Care | Payer: Self-pay | Attending: Emergency Medicine | Admitting: Emergency Medicine

## 2013-12-04 ENCOUNTER — Emergency Department (HOSPITAL_COMMUNITY): Payer: Self-pay

## 2013-12-04 DIAGNOSIS — R059 Cough, unspecified: Secondary | ICD-10-CM | POA: Insufficient documentation

## 2013-12-04 DIAGNOSIS — Z720 Tobacco use: Secondary | ICD-10-CM

## 2013-12-04 DIAGNOSIS — Z79899 Other long term (current) drug therapy: Secondary | ICD-10-CM | POA: Insufficient documentation

## 2013-12-04 DIAGNOSIS — I10 Essential (primary) hypertension: Secondary | ICD-10-CM | POA: Insufficient documentation

## 2013-12-04 DIAGNOSIS — F17200 Nicotine dependence, unspecified, uncomplicated: Secondary | ICD-10-CM | POA: Insufficient documentation

## 2013-12-04 DIAGNOSIS — R05 Cough: Secondary | ICD-10-CM | POA: Insufficient documentation

## 2013-12-04 DIAGNOSIS — J45901 Unspecified asthma with (acute) exacerbation: Secondary | ICD-10-CM | POA: Insufficient documentation

## 2013-12-04 DIAGNOSIS — R109 Unspecified abdominal pain: Secondary | ICD-10-CM | POA: Insufficient documentation

## 2013-12-04 DIAGNOSIS — M549 Dorsalgia, unspecified: Secondary | ICD-10-CM | POA: Insufficient documentation

## 2013-12-04 DIAGNOSIS — J069 Acute upper respiratory infection, unspecified: Secondary | ICD-10-CM | POA: Insufficient documentation

## 2013-12-04 DIAGNOSIS — K219 Gastro-esophageal reflux disease without esophagitis: Secondary | ICD-10-CM | POA: Insufficient documentation

## 2013-12-04 MED ORDER — PREDNISONE 20 MG PO TABS: 40.0000 mg | ORAL_TABLET | Freq: Every day | ORAL | Status: DC

## 2013-12-04 MED ORDER — ALBUTEROL SULFATE (2.5 MG/3ML) 0.083% IN NEBU
5.0000 mg | INHALATION_SOLUTION | Freq: Once | RESPIRATORY_TRACT | Status: AC
  Administered 2013-12-04: 5 mg via RESPIRATORY_TRACT
  Filled 2013-12-04: qty 6

## 2013-12-04 MED ORDER — ALBUTEROL SULFATE HFA 108 (90 BASE) MCG/ACT IN AERS
2.0000 | INHALATION_SPRAY | Freq: Once | RESPIRATORY_TRACT | Status: AC
  Administered 2013-12-04: 2 via RESPIRATORY_TRACT
  Filled 2013-12-04: qty 6.7

## 2013-12-04 MED ORDER — PREDNISONE 20 MG PO TABS
60.0000 mg | ORAL_TABLET | Freq: Once | ORAL | Status: AC
  Administered 2013-12-04: 60 mg via ORAL
  Filled 2013-12-04: qty 3

## 2013-12-04 MED ORDER — OXYCODONE-ACETAMINOPHEN 5-325 MG PO TABS: 2.0000 | ORAL_TABLET | Freq: Once | ORAL | Status: DC

## 2013-12-04 MED ORDER — HYDROCODONE-HOMATROPINE 5-1.5 MG/5ML PO SYRP: 5.0000 mL | ORAL_SOLUTION | Freq: Four times a day (QID) | ORAL | Status: DC | PRN

## 2013-12-04 MED ORDER — OXYCODONE-ACETAMINOPHEN 5-325 MG PO TABS
1.0000 | ORAL_TABLET | Freq: Once | ORAL | Status: AC
  Administered 2013-12-04: 1 via ORAL
  Filled 2013-12-04: qty 1

## 2013-12-04 MED ORDER — IPRATROPIUM BROMIDE 0.02 % IN SOLN
0.5000 mg | Freq: Once | RESPIRATORY_TRACT | Status: AC
  Administered 2013-12-04: 0.5 mg via RESPIRATORY_TRACT
  Filled 2013-12-04: qty 2.5

## 2013-12-04 NOTE — Discharge Instructions (Signed)
Read the instructions below on reasons to return to the emergency department and to learn more about your diagnosis.  Use over the counter medications for symptomatic relief as we discussed (musinex as a decongestant, Tylenol for fever/pain, Motrin/Ibuprofen for muscle aches). If prescribed a cough suppressant during your visit, do not operate heavy machinery with in 5 hours of taking this medication. Followup with your primary care doctor in 4 days if your symptoms persist.  Your more than welcome to return to the emergency department if symptoms worsen or become concerning.    Upper Respiratory Infection, Adult  An upper respiratory infection (URI) is also sometimes known as the common cold. Most people improve within 1 week, but symptoms can last up to 2 weeks. A residual cough may last even longer.   URI is most commonly caused by a virus. Viruses are NOT treated with antibiotics. You can easily spread the virus to others by oral contact. This includes kissing, sharing a glass, coughing, or sneezing. Touching your mouth or nose and then touching a surface, which is then touched by another person, can also spread the virus.   TREATMENT  Treatment is directed at relieving symptoms. There is no cure. Antibiotics are not effective, because the infection is caused by a virus, not by bacteria. Treatment may include:  Increased fluid intake. Sports drinks offer valuable electrolytes, sugars, and fluids.  Breathing heated mist or steam (vaporizer or shower).  Eating chicken soup or other clear broths, and maintaining good nutrition.  Getting plenty of rest.  Using gargles or lozenges for comfort.  Controlling fevers with ibuprofen or acetaminophen as directed by your caregiver.  Increasing usage of your inhaler if you have asthma.  Return to work when your temperature has returned to normal.   SEEK MEDICAL CARE IF:  After the first few days, you feel you are getting worse rather than better.  You  develop worsening shortness of breath, or brown or red sputum. These may be signs of pneumonia.  You develop yellow or brown nasal discharge or pain in the face, especially when you bend forward. These may be signs of sinusitis.  You develop a fever, swollen neck glands, pain with swallowing, or white areas in the back of your throat. These may be signs of strep throat.   Metered Dose Inhaler (No Spacer Used) Inhaled medicines are the basis of treatment for asthma and other breathing problems. Inhaled medicine can only be effective if used properly. Good technique assures that the medicine reaches the lungs. Metered dose inhalers (MDIs) are used to deliver a variety of inhaled medicines. These include quick relief or rescue medicines (such as bronchodilators) and controller medicines (such as corticosteroids). The medicine is delivered by pushing down on a metal canister to release a set amount of spray. If you are using different kinds of inhalers, use your quick relief medicine to open the airways 10-15 minutes before using a steroid, if instructed to do so by your health care provider. If you are unsure which inhalers to use and the order of using them, ask your health care provider, nurse, or respiratory therapist. HOW TO USE THE INHALER 1. Remove the cap from the inhaler. 2. If you are using the inhaler for the first time, you will need to prime it. Shake the inhaler for 5 seconds and release four puffs into the air, away from your face. Ask your health care provider or pharmacist if you have questions about priming your inhaler. 3. Shake the  inhaler for 5 seconds before each breath in (inhalation). 4. Position the inhaler so that the top of the canister faces up. 5. Put your index finger on the top of the medicine canister. Your thumb supports the bottom of the inhaler. 6. Open your mouth. 7. Either place the inhaler between your teeth and place your lips tightly around the mouthpiece, or hold  the inhaler 1-2 inches away from your open mouth. If you are unsure of which technique to use, ask your health care provider. 8. Breathe out (exhale) normally and as completely as possible. 9. Press the canister down with the index finger to release the medicine. 10. At the same time as the canister is pressed, inhale deeply and slowly until your lungs are completely filled. This should take 4-6 seconds. Keep your tongue down. 11. Hold the medicine in your lungs for 5-10 seconds (10 seconds is best). This helps the medicine get into the small airways of your lungs. 12. Breathe out slowly, through pursed lips. Whistling is an example of pursed lips. 13. Wait at least 1 minute between puffs. Continue with the above steps until you have taken the number of puffs your health care provider has ordered. Do not use the inhaler more than your health care provider directs you to. 14. Replace the cap on the inhaler. 15. Follow the directions from your health care provider or the inhaler insert for cleaning the inhaler. If you are using a steroid inhaler, after your last puff, rinse your mouth with water, gargle, and spit out the water. Do not swallow the water. AVOID:  Inhaling before or after starting the spray of medicine. It takes practice to coordinate your breathing with triggering the spray.  Inhaling through the nose (rather than the mouth) when triggering the spray. HOW TO DETERMINE IF YOUR INHALER IS FULL OR NEARLY EMPTY You cannot know when an inhaler is empty by shaking it. Some inhalers are now being made with dose counters. Ask your health care provider for a prescription that has a dose counter if you feel you need that extra help. If your inhaler does not have a counter, ask your health care provider to help you determine the date you need to refill your inhaler. Write the refill date on a calendar or your inhaler canister. Refill your inhaler 7-10 days before it runs out. Be sure to keep an  adequate supply of medicine. This includes making sure it has not expired, and making sure you have a spare inhaler. SEEK MEDICAL CARE IF:  Symptoms are only partially relieved with your inhaler.  You are having trouble using your inhaler.  You experience an increase in phlegm. SEEK IMMEDIATE MEDICAL CARE IF:  You feel little or no relief with your inhalers. You are still wheezing and feeling shortness of breath, tightness in your chest, or both.  You have dizziness, headaches, or a fast heart rate.  You have chills, fever, or night sweats.  There is a noticeable increase in phlegm production, or there is blood in the phlegm. MAKE SURE YOU:  Understand these instructions.  Will watch your condition.  Will get help right away if you are not doing well or get worse. Document Released: 02/23/2007 Document Revised: 09/12/2013 Document Reviewed: 10/14/2012 [facility] Patient Information 2015 [place], [place]. This information is not intended to replace advice given to you by your health care provider. Make sure you discuss any questions you have with your health care provider.  Smoking Cessation, Tips for Success If  you are ready to quit smoking, congratulations! You have chosen to help yourself be healthier. Cigarettes bring nicotine, tar, carbon monoxide, and other irritants into your body. Your lungs, heart, and blood vessels will be able to work better without these poisons. There are many different ways to quit smoking. Nicotine gum, nicotine patches, a nicotine inhaler, or nicotine nasal spray can help with physical craving. Hypnosis, support groups, and medicines help break the habit of smoking. WHAT THINGS CAN I DO TO MAKE QUITTING EASIER?  Here are some tips to help you quit for good:  Pick a date when you will quit smoking completely. Tell all of your friends and family about your plan to quit on that date.  Do not try to slowly cut down on the number of cigarettes you are  smoking. Pick a quit date and quit smoking completely starting on that day.  Throw away all cigarettes.   Clean and remove all ashtrays from your home, work, and car.  On a card, write down your reasons for quitting. Carry the card with you and read it when you get the urge to smoke.  Cleanse your body of nicotine. Drink enough water and fluids to keep your urine clear or pale yellow. Do this after quitting to flush the nicotine from your body.  Learn to predict your moods. Do not let a bad situation be your excuse to have a cigarette. Some situations in your life might tempt you into wanting a cigarette.  Never have "just one" cigarette. It leads to wanting another and another. Remind yourself of your decision to quit.  Change habits associated with smoking. If you smoked while driving or when feeling stressed, try other activities to replace smoking. Stand up when drinking your coffee. Brush your teeth after eating. Sit in a different chair when you read the paper. Avoid alcohol while trying to quit, and try to drink fewer caffeinated beverages. Alcohol and caffeine may urge you to smoke.  Avoid foods and drinks that can trigger a desire to smoke, such as sugary or spicy foods and alcohol.  Ask people who smoke not to smoke around you.  Have something planned to do right after eating or having a cup of coffee. For example, plan to take a walk or exercise.  Try a relaxation exercise to calm you down and decrease your stress. Remember, you may be tense and nervous for the first 2 weeks after you quit, but this will pass.  Find new activities to keep your hands busy. Play with a pen, coin, or rubber band. Doodle or draw things on paper.  Brush your teeth right after eating. This will help cut down on the craving for the taste of tobacco after meals. You can also try mouthwash.   Use oral substitutes in place of cigarettes. Try using lemon drops, carrots, cinnamon sticks, or chewing gum.  Keep them handy so they are available when you have the urge to smoke.  When you have the urge to smoke, try deep breathing.  Designate your home as a nonsmoking area.  If you are a heavy smoker, ask your health care provider about a prescription for nicotine chewing gum. It can ease your withdrawal from nicotine.  Reward yourself. Set aside the cigarette money you save and buy yourself something nice.  Look for support from others. Join a support group or smoking cessation program. Ask someone at home or at work to help you with your plan to quit smoking.  Always  ask yourself, "Do I need this cigarette or is this just a reflex?" Tell yourself, "Today, I choose not to smoke," or "I do not want to smoke." You are reminding yourself of your decision to quit.  Do not replace cigarette smoking with electronic cigarettes (commonly called e-cigarettes). The safety of e-cigarettes is unknown, and some may contain harmful chemicals.  If you relapse, do not give up! Plan ahead and think about what you will do the next time you get the urge to smoke. HOW WILL I FEEL WHEN I QUIT SMOKING? You may have symptoms of withdrawal because your body is used to nicotine (the addictive substance in cigarettes). You may crave cigarettes, be irritable, feel very hungry, cough often, get headaches, or have difficulty concentrating. The withdrawal symptoms are only temporary. They are strongest when you first quit but will go away within 10-14 days. When withdrawal symptoms occur, stay in control. Think about your reasons for quitting. Remind yourself that these are signs that your body is healing and getting used to being without cigarettes. Remember that withdrawal symptoms are easier to treat than the major diseases that smoking can cause.  Even after the withdrawal is over, expect periodic urges to smoke. However, these cravings are generally short lived and will go away whether you smoke or not. Do not smoke! WHAT  RESOURCES ARE AVAILABLE TO HELP ME QUIT SMOKING? Your health care provider can direct you to community resources or hospitals for support, which may include:  Group support.  Education.  Hypnosis.  Therapy. Document Released: 01/25/2004 Document Revised: 09/12/2013 Document Reviewed: 10/14/2012 [facility] Patient Information 2015 [place], [place]. This information is not intended to replace advice given to you by your health care provider. Make sure you discuss any questions you have with your health care provider.  Upper Respiratory Infection, Adult An upper respiratory infection (URI) is also sometimes known as the common cold. The upper respiratory tract includes the nose, sinuses, throat, trachea, and bronchi. Bronchi are the airways leading to the lungs. Most people improve within 1 week, but symptoms can last up to 2 weeks. A residual cough may last even longer.  CAUSES Many different viruses can infect the tissues lining the upper respiratory tract. The tissues become irritated and inflamed and often become very moist. Mucus production is also common. A cold is contagious. You can easily spread the virus to others by oral contact. This includes kissing, sharing a glass, coughing, or sneezing. Touching your mouth or nose and then touching a surface, which is then touched by another person, can also spread the virus. SYMPTOMS  Symptoms typically develop 1 to 3 days after you come in contact with a cold virus. Symptoms vary from person to person. They may include:  Runny nose.  Sneezing.  Nasal congestion.  Sinus irritation.  Sore throat.  Loss of voice (laryngitis).  Cough.  Fatigue.  Muscle aches.  Loss of appetite.  Headache.  Low-grade fever. DIAGNOSIS  You might diagnose your own cold based on familiar symptoms, since most people get a cold 2 to 3 times a year. Your caregiver can confirm this based on your exam. Most importantly, your caregiver can check that your  symptoms are not due to another disease such as strep throat, sinusitis, pneumonia, asthma, or epiglottitis. Blood tests, throat tests, and X-rays are not necessary to diagnose a common cold, but they may sometimes be helpful in excluding other more serious diseases. Your caregiver will decide if any further tests are required. RISKS  AND COMPLICATIONS  You may be at risk for a more severe case of the common cold if you smoke cigarettes, have chronic heart disease (such as heart failure) or lung disease (such as asthma), or if you have a weakened immune system. The very young and very old are also at risk for more serious infections. Bacterial sinusitis, middle ear infections, and bacterial pneumonia can complicate the common cold. The common cold can worsen asthma and chronic obstructive pulmonary disease (COPD). Sometimes, these complications can require emergency medical care and may be life-threatening. PREVENTION  The best way to protect against getting a cold is to practice good hygiene. Avoid oral or hand contact with people with cold symptoms. Wash your hands often if contact occurs. There is no clear evidence that vitamin C, vitamin E, echinacea, or exercise reduces the chance of developing a cold. However, it is always recommended to get plenty of rest and practice good nutrition. TREATMENT  Treatment is directed at relieving symptoms. There is no cure. Antibiotics are not effective, because the infection is caused by a virus, not by bacteria. Treatment may include:  Increased fluid intake. Sports drinks offer valuable electrolytes, sugars, and fluids.  Breathing heated mist or steam (vaporizer or shower).  Eating chicken soup or other clear broths, and maintaining good nutrition.  Getting plenty of rest.  Using gargles or lozenges for comfort.  Controlling fevers with ibuprofen or acetaminophen as directed by your caregiver.  Increasing usage of your inhaler if you have asthma. Zinc  gel and zinc lozenges, taken in the first 24 hours of the common cold, can shorten the duration and lessen the severity of symptoms. Pain medicines may help with fever, muscle aches, and throat pain. A variety of non-prescription medicines are available to treat congestion and runny nose. Your caregiver can make recommendations and may suggest nasal or lung inhalers for other symptoms.  HOME CARE INSTRUCTIONS   Only take over-the-counter or prescription medicines for pain, discomfort, or fever as directed by your caregiver.  Use a warm mist humidifier or inhale steam from a shower to increase air moisture. This may keep secretions moist and make it easier to breathe.  Drink enough water and fluids to keep your urine clear or pale yellow.  Rest as needed.  Return to work when your temperature has returned to normal or as your caregiver advises. You may need to stay home longer to avoid infecting others. You can also use a face mask and careful hand washing to prevent spread of the virus. SEEK MEDICAL CARE IF:   After the first few days, you feel you are getting worse rather than better.  You need your caregiver's advice about medicines to control symptoms.  You develop chills, worsening shortness of breath, or brown or red sputum. These may be signs of pneumonia.  You develop yellow or brown nasal discharge or pain in the face, especially when you bend forward. These may be signs of sinusitis.  You develop a fever, swollen neck glands, pain with swallowing, or white areas in the back of your throat. These may be signs of strep throat. SEEK IMMEDIATE MEDICAL CARE IF:   You have a fever.  You develop severe or persistent headache, ear pain, sinus pain, or chest pain.  You develop wheezing, a prolonged cough, cough up blood, or have a change in your usual mucus (if you have chronic lung disease).  You develop sore muscles or a stiff neck. Document Released: 10/22/2000 Document Revised:  07/21/2011  Document Reviewed: 08/03/2013 [facility] Patient Information 2015 [place], [place]. This information is not intended to replace advice given to you by your health care provider. Make sure you discuss any questions you have with your health care provider.  Bronchospasm A bronchospasm is a spasm or tightening of the airways going into the lungs. During a bronchospasm breathing becomes more difficult because the airways get smaller. When this happens there can be coughing, a whistling sound when breathing (wheezing), and difficulty breathing. Bronchospasm is often associated with asthma, but not all patients who experience a bronchospasm have asthma. CAUSES  A bronchospasm is caused by inflammation or irritation of the airways. The inflammation or irritation may be triggered by:   Allergies (such as to animals, pollen, food, or mold). Allergens that cause bronchospasm may cause wheezing immediately after exposure or many hours later.   Infection. Viral infections are believed to be the most common cause of bronchospasm.   Exercise.   Irritants (such as pollution, cigarette smoke, strong odors, aerosol sprays, and paint fumes).   Weather changes. Winds increase molds and pollens in the air. Rain refreshes the air by washing irritants out. Cold air may cause inflammation.   Stress and emotional upset.  SIGNS AND SYMPTOMS   Wheezing.   Excessive nighttime coughing.   Frequent or severe coughing with a simple cold.   Chest tightness.   Shortness of breath.  DIAGNOSIS  Bronchospasm is usually diagnosed through a history and physical exam. Tests, such as chest X-rays, are sometimes done to look for other conditions. TREATMENT   Inhaled medicines can be given to open up your airways and help you breathe. The medicines can be given using either an inhaler or a nebulizer machine.  Corticosteroid medicines may be given for severe bronchospasm, usually when it is associated  with asthma. HOME CARE INSTRUCTIONS   Always have a plan prepared for seeking medical care. Know when to call your health care provider and local emergency services (911 in the [place] Know where you can access local emergency care.  Only take medicines as directed by your health care provider.  If you were prescribed an inhaler or nebulizer machine, ask your health care provider to explain how to use it correctly. Always use a spacer with your inhaler if you were given one.  It is necessary to remain calm during an attack. Try to relax and breathe more slowly.  Control your home environment in the following ways:   Change your heating and air conditioning filter at least once a month.   Limit your use of fireplaces and wood stoves.  Do not smoke and do not allow smoking in your home.   Avoid exposure to perfumes and fragrances.   Get rid of pests (such as roaches and mice) and their droppings.   Throw away plants if you see mold on them.   Keep your house clean and dust free.   Replace carpet with wood, tile, or vinyl flooring. Carpet can trap dander and dust.   Use allergy-proof pillows, mattress covers, and box spring covers.   Wash bed sheets and blankets every week in hot water and dry them in a dryer.   Use blankets that are made of polyester or cotton.   Wash hands frequently. SEEK MEDICAL CARE IF:   You have muscle aches.   You have chest pain.   The sputum changes from clear or white to yellow, green, gray, or bloody.   The sputum you cough up gets thicker.  There are problems that may be related to the medicine you are given, such as a rash, itching, swelling, or trouble breathing.  SEEK IMMEDIATE MEDICAL CARE IF:   You have worsening wheezing and coughing even after taking your prescribed medicines.   You have increased difficulty breathing.   You develop severe chest pain. MAKE SURE YOU:   Understand these instructions.  Will  watch your condition.  Will get help right away if you are not doing well or get worse. Document Released: 05/01/2003 Document Revised: 05/03/2013 Document Reviewed: 10/18/2012 [facility] Patient Information 2015 [place], [place]. This information is not intended to replace advice given to you by your health care provider. Make sure you discuss any questions you have with your health care provider.

## 2013-12-04 NOTE — ED Provider Notes (Signed)
CSN: [id number]     Arrival date & time 12/04/13  1335 History   First MD Initiated Contact with Patient 12/04/13 1651     Chief Complaint  Patient presents with  . Cough   HPI HPI Comments: Vincent Clarke is a 48 y.o. male who presents to the Emergency Department complaining of a cough onset 4 days ago. He reports that it started Thursday and worsened the next day and was unable to go to work. He reports associated exudate, hoarseness of the voice, back and abdominal pain and has been unable to sleep. He reports that he has been taking Advil, Nyquil and Aleve to no relief. He reports that he has a smoker but is trying to quit. He reports rhinorrhea and fever on Friday and Saturday.  Past Medical History  Diagnosis Date  . Back pain   . Hypertension   . GERD (gastroesophageal reflux disease)    History reviewed. No pertinent past surgical history. No family history on file. History  Substance Use Topics  . Smoking status: Current Every Day Smoker -- 0.50 packs/day    Types: Cigarettes  . Smokeless tobacco: Not on file  . Alcohol Use: Yes     Comment: occ    Review of Systems  Constitutional: Positive for fever.  HENT: Positive for rhinorrhea.   Respiratory: Positive for cough.   Gastrointestinal: Positive for abdominal pain.  Musculoskeletal: Positive for back pain.   Allergies  Tramadol  Home Medications   Prior to Admission medications   Medication Sig Start Date End Date Taking? Authorizing Provider  GuaiFENesin (CHEST CONGESTION RELIEF PO) Take by mouth.    Historical Provider, MD  HYDROcodone-acetaminophen (NORCO/VICODIN) 5-325 MG per tablet Take 1 tablet by mouth at bedtime as needed. 04/21/13   Trixie Dredge, PA-C  lisinopril (PRINIVIL,ZESTRIL) 40 MG tablet Take 40 mg by mouth daily.    Historical Provider, MD  methocarbamol (ROBAXIN) 500 MG tablet Take 1 tablet (500 mg total) by mouth every 6 (six) hours as needed for muscle spasms. 04/21/13   Trixie Dredge, PA-C   naproxen sodium (ALEVE) 220 MG tablet Take 440 mg by mouth daily as needed (for pain).     Historical Provider, MD  omeprazole (PRILOSEC OTC) 20 MG tablet Take 20 mg by mouth daily.    Historical Provider, MD   BP 152/88  Pulse 98  Temp(Src) 98.2 F (36.8 C) (Oral)  Resp 20  SpO2 97% Physical Exam  Nursing note and vitals reviewed. Constitutional: He is oriented to person, place, and time. He appears well-developed and well-nourished.  HENT:  Head: Normocephalic and atraumatic.  Eyes: Conjunctivae and EOM are normal.  Neck: Normal range of motion. Neck supple.  Cardiovascular: Normal rate.   Pulmonary/Chest: Effort normal. He has wheezes.  Diffuse inspiratory and expiratory wheeze with coarse rhonci.   Abdominal: Soft. There is no tenderness.  Musculoskeletal: Normal range of motion.  Neurological: He is alert and oriented to person, place, and time.  Skin: Skin is warm and dry.  Psychiatric: He has a normal mood and affect. His behavior is normal.    ED Course  Procedures (including critical care time) DIAGNOSTIC STUDIES: Oxygen Saturation is 97% on room air, normal by my interpretation.    COORDINATION OF CARE: 5:09 PM-Discussed treatment plan which includes breathing treatment and pain medication with pt at bedside and pt agreed to plan.  Patient counseled on tobacco cessation for 5 minutes.   Labs Review Labs Reviewed - No data to display  Imaging Review Dg Chest 2 View  12/04/2013   CLINICAL DATA:  COUGH  EXAM: CHEST - 2 VIEW  COMPARISON:  09/22/2012  FINDINGS: Atheromatous aorta. Lungs are clear. Heart size normal. No effusion. Regional bones unremarkable.  IMPRESSION: No acute disease   Electronically Signed   By: Oley Balmaniel  Hassell M.D.   On: 12/04/2013 14:35    EKG Interpretation None      MDM   Final diagnoses:  URI (upper respiratory infection)  RAD (reactive airway disease), unspecified asthma severity, with acute exacerbation  Tobacco abuse    6:48  PM BP 159/104  Pulse 69  Temp(Src) 97.7 F (36.5 C) (Oral)  Resp 20  SpO2 97% Patient with htn, reactive airway. Patient given nebulizer treatment with 5 mg albuterol and 0.5 mg Atrovent with significant improvement in wheezing. To clear her rhonchi from the chest. Patient given prednisone and pain medication here. He has pain in his abdomen and back from his heavy coughing. Patient will be discharged with prednisone, albuterol MDI, and Hycodan C. workup. He is to follow up with his primary care physician as needed. Return precautions discussed   I personally performed the services described in this documentation, which was scribed in my presence. The recorded information has been reviewed and is accurate.      Arthor Captainbigail Corbitt Cloke, PA-C 12/04/13 1850

## 2013-12-04 NOTE — ED Notes (Signed)
Declined W/C at D/C and was escorted to lobby by RN. 

## 2013-12-04 NOTE — ED Notes (Addendum)
Pt reports productive cough x Friday with yellow phlegm and nasal congestion. Pt also reports yesterday he coughed so much he threw up. Pt denies N/V/D today. Pt reports intermittent chills. Rhonchi noted bilaterally. Speaks in complete sentences. Denies chest pain, denies abdominal pain "unless I cough. My muscles in my stomach are sore." Pt in NAD.

## 2013-12-04 NOTE — ED Notes (Signed)
RT at bedside for treatment

## 2013-12-12 NOTE — ED Provider Notes (Signed)
Medical screening examination/treatment/procedure(s) were performed by non-physician practitioner and as supervising physician I was immediately available for consultation/collaboration.   EKG Interpretation None        Dagmar HaitWilliam Charlita Brian, MD 12/12/13 1538

## 2014-03-20 ENCOUNTER — Encounter (HOSPITAL_COMMUNITY)
Admission: EM | Disposition: A | Discharge status: Home / Self Care | Payer: Self-pay | Source: Home / Self Care | Attending: Emergency Medicine

## 2014-03-20 ENCOUNTER — Observation Stay (HOSPITAL_COMMUNITY): Payer: Self-pay | Admitting: Anesthesiology

## 2014-03-20 ENCOUNTER — Emergency Department (HOSPITAL_COMMUNITY): Payer: Self-pay

## 2014-03-20 ENCOUNTER — Encounter (HOSPITAL_COMMUNITY): Payer: Self-pay | Admitting: Emergency Medicine

## 2014-03-20 ENCOUNTER — Observation Stay (HOSPITAL_COMMUNITY)
Admission: EM | Admit: 2014-03-20 | Discharge: 2014-03-21 | Disposition: A | Discharge status: Home / Self Care | Payer: Self-pay | Attending: General Surgery | Admitting: General Surgery

## 2014-03-20 DIAGNOSIS — R109 Unspecified abdominal pain: Secondary | ICD-10-CM

## 2014-03-20 DIAGNOSIS — K8000 Calculus of gallbladder with acute cholecystitis without obstruction: Principal | ICD-10-CM | POA: Insufficient documentation

## 2014-03-20 DIAGNOSIS — J45909 Unspecified asthma, uncomplicated: Secondary | ICD-10-CM | POA: Insufficient documentation

## 2014-03-20 DIAGNOSIS — Z7952 Long term (current) use of systemic steroids: Secondary | ICD-10-CM | POA: Insufficient documentation

## 2014-03-20 DIAGNOSIS — K219 Gastro-esophageal reflux disease without esophagitis: Secondary | ICD-10-CM | POA: Insufficient documentation

## 2014-03-20 DIAGNOSIS — I10 Essential (primary) hypertension: Secondary | ICD-10-CM | POA: Insufficient documentation

## 2014-03-20 DIAGNOSIS — F17200 Nicotine dependence, unspecified, uncomplicated: Secondary | ICD-10-CM | POA: Diagnosis present

## 2014-03-20 DIAGNOSIS — F17210 Nicotine dependence, cigarettes, uncomplicated: Secondary | ICD-10-CM | POA: Insufficient documentation

## 2014-03-20 DIAGNOSIS — K810 Acute cholecystitis: Secondary | ICD-10-CM | POA: Diagnosis present

## 2014-03-20 DIAGNOSIS — Z79891 Long term (current) use of opiate analgesic: Secondary | ICD-10-CM | POA: Insufficient documentation

## 2014-03-20 DIAGNOSIS — R1011 Right upper quadrant pain: Secondary | ICD-10-CM

## 2014-03-20 HISTORY — PX: CHOLECYSTECTOMY: SHX55

## 2014-03-20 HISTORY — DX: Unspecified asthma, uncomplicated: J45.909

## 2014-03-20 HISTORY — PX: LAPAROSCOPIC CHOLECYSTECTOMY: SUR755

## 2014-03-20 LAB — CBC WITH DIFFERENTIAL/PLATELET
Basophils Absolute: 0 10*3/uL (ref 0.0–0.1)
Basophils Relative: 0 % (ref 0–1)
Eosinophils Absolute: 0.3 10*3/uL (ref 0.0–0.7)
Eosinophils Relative: 2 % (ref 0–5)
HCT: 45.3 % (ref 39.0–52.0)
Hemoglobin: 15.9 g/dL (ref 13.0–17.0)
Lymphocytes Relative: 20 % (ref 12–46)
Lymphs Abs: 2.5 10*3/uL (ref 0.7–4.0)
MCH: 30.7 pg (ref 26.0–34.0)
MCHC: 35.1 g/dL (ref 30.0–36.0)
MCV: 87.5 fL (ref 78.0–100.0)
Monocytes Absolute: 1 10*3/uL (ref 0.1–1.0)
Monocytes Relative: 8 % (ref 3–12)
Neutro Abs: 9.1 10*3/uL — ABNORMAL HIGH (ref 1.7–7.7)
Neutrophils Relative %: 70 % (ref 43–77)
Platelets: 374 10*3/uL (ref 150–400)
RBC: 5.18 MIL/uL (ref 4.22–5.81)
RDW: 12.6 % (ref 11.5–15.5)
WBC: 12.9 10*3/uL — ABNORMAL HIGH (ref 4.0–10.5)

## 2014-03-20 LAB — COMPREHENSIVE METABOLIC PANEL
ALT: 20 U/L (ref 0–53)
AST: 17 U/L (ref 0–37)
Albumin: 3.8 g/dL (ref 3.5–5.2)
Alkaline Phosphatase: 98 U/L (ref 39–117)
Anion gap: 14 (ref 5–15)
BUN: 8 mg/dL (ref 6–23)
CO2: 23 mEq/L (ref 19–32)
Calcium: 9.7 mg/dL (ref 8.4–10.5)
Chloride: 101 mEq/L (ref 96–112)
Creatinine, Ser: 0.86 mg/dL (ref 0.50–1.35)
GFR calc Af Amer: >90 mL/min (ref >90)
GFR calc non Af Amer: >90 mL/min (ref >90)
Glucose, Bld: 99 mg/dL (ref 70–99)
Potassium: 3.9 mEq/L (ref 3.7–5.3)
Sodium: 138 mEq/L (ref 137–147)
Total Bilirubin: 0.3 mg/dL (ref 0.3–1.2)
Total Protein: 7.1 g/dL (ref 6.0–8.3)

## 2014-03-20 LAB — LIPASE, BLOOD: Lipase: 25 U/L (ref 11–59)

## 2014-03-20 SURGERY — LAPAROSCOPIC CHOLECYSTECTOMY
Anesthesia: General | Site: Abdomen

## 2014-03-20 MED ORDER — GLYCOPYRROLATE 0.2 MG/ML IJ SOLN
INTRAMUSCULAR | Status: DC | PRN
  Administered 2014-03-20: 0.6 mg via INTRAVENOUS

## 2014-03-20 MED ORDER — ONDANSETRON HCL 4 MG PO TABS: 4.0000 mg | ORAL_TABLET | Freq: Four times a day (QID) | ORAL | Status: DC | PRN

## 2014-03-20 MED ORDER — ONDANSETRON HCL 4 MG/2ML IJ SOLN
INTRAMUSCULAR | Status: DC | PRN
  Administered 2014-03-20: 4 mg via INTRAVENOUS

## 2014-03-20 MED ORDER — OXYCODONE-ACETAMINOPHEN 5-325 MG PO TABS
1.0000 | ORAL_TABLET | ORAL | Status: DC | PRN
  Administered 2014-03-20 – 2014-03-21 (×3): 2 via ORAL
  Filled 2014-03-20 (×3): qty 2

## 2014-03-20 MED ORDER — PROPOFOL 10 MG/ML IV BOLUS
INTRAVENOUS | Status: AC
  Filled 2014-03-20: qty 20

## 2014-03-20 MED ORDER — HYDROMORPHONE HCL 1 MG/ML IJ SOLN
1.0000 mg | Freq: Once | INTRAMUSCULAR | Status: AC
Start: 2014-03-20 — End: 2014-03-20
  Administered 2014-03-20: 1 mg via INTRAVENOUS
  Filled 2014-03-20: qty 1

## 2014-03-20 MED ORDER — FENTANYL CITRATE 0.05 MG/ML IJ SOLN
INTRAMUSCULAR | Status: DC | PRN
  Administered 2014-03-20 (×3): 50 ug via INTRAVENOUS
  Administered 2014-03-20: 100 ug via INTRAVENOUS

## 2014-03-20 MED ORDER — GLYCOPYRROLATE 0.2 MG/ML IJ SOLN
INTRAMUSCULAR | Status: AC
  Filled 2014-03-20: qty 3

## 2014-03-20 MED ORDER — ONDANSETRON HCL 4 MG/2ML IJ SOLN: 4.0000 mg | Freq: Four times a day (QID) | INTRAMUSCULAR | Status: DC | PRN

## 2014-03-20 MED ORDER — BUPIVACAINE-EPINEPHRINE (PF) 0.25% -1:200000 IJ SOLN
INTRAMUSCULAR | Status: AC
  Filled 2014-03-20: qty 30

## 2014-03-20 MED ORDER — SODIUM CHLORIDE 0.9 % IV SOLN: INTRAVENOUS | Status: DC | PRN

## 2014-03-20 MED ORDER — LIDOCAINE HCL (CARDIAC) 20 MG/ML IV SOLN
INTRAVENOUS | Status: AC
  Filled 2014-03-20: qty 5

## 2014-03-20 MED ORDER — ENOXAPARIN SODIUM 40 MG/0.4ML ~~LOC~~ SOLN
40.0000 mg | SUBCUTANEOUS | Status: DC
  Administered 2014-03-21: 40 mg via SUBCUTANEOUS
  Filled 2014-03-20 (×2): qty 0.4

## 2014-03-20 MED ORDER — LACTATED RINGERS IV SOLN
INTRAVENOUS | Status: DC | PRN
  Administered 2014-03-20 (×2): via INTRAVENOUS

## 2014-03-20 MED ORDER — ONDANSETRON HCL 4 MG/2ML IJ SOLN
INTRAMUSCULAR | Status: AC
  Filled 2014-03-20: qty 2

## 2014-03-20 MED ORDER — KCL IN DEXTROSE-NACL 20-5-0.45 MEQ/L-%-% IV SOLN
INTRAVENOUS | Status: DC
  Administered 2014-03-20 – 2014-03-21 (×2): via INTRAVENOUS
  Filled 2014-03-20 (×3): qty 1000

## 2014-03-20 MED ORDER — HYDROMORPHONE HCL 1 MG/ML IJ SOLN: 0.2500 mg | INTRAMUSCULAR | Status: DC | PRN

## 2014-03-20 MED ORDER — HYDROMORPHONE HCL 1 MG/ML IJ SOLN
1.0000 mg | Freq: Once | INTRAMUSCULAR | Status: AC
  Administered 2014-03-20: 1 mg via INTRAVENOUS
  Filled 2014-03-20: qty 1

## 2014-03-20 MED ORDER — MIDAZOLAM HCL 5 MG/5ML IJ SOLN
INTRAMUSCULAR | Status: DC | PRN
  Administered 2014-03-20: 2 mg via INTRAVENOUS

## 2014-03-20 MED ORDER — MORPHINE SULFATE 2 MG/ML IJ SOLN
1.0000 mg | INTRAMUSCULAR | Status: DC | PRN
  Administered 2014-03-20: 1 mg via INTRAVENOUS
  Administered 2014-03-20: 2 mg via INTRAVENOUS
  Filled 2014-03-20 (×2): qty 1

## 2014-03-20 MED ORDER — OXYCODONE HCL 5 MG PO TABS: 5.0000 mg | ORAL_TABLET | Freq: Once | ORAL | Status: DC | PRN

## 2014-03-20 MED ORDER — ONDANSETRON HCL 4 MG/2ML IJ SOLN
4.0000 mg | Freq: Once | INTRAMUSCULAR | Status: AC
  Administered 2014-03-20: 4 mg via INTRAVENOUS
  Filled 2014-03-20: qty 2

## 2014-03-20 MED ORDER — NEOSTIGMINE METHYLSULFATE 10 MG/10ML IV SOLN
INTRAVENOUS | Status: AC
  Filled 2014-03-20: qty 1

## 2014-03-20 MED ORDER — SODIUM CHLORIDE 0.9 % IV BOLUS (SEPSIS)
1000.0000 mL | Freq: Once | INTRAVENOUS | Status: AC
  Administered 2014-03-20: 1000 mL via INTRAVENOUS

## 2014-03-20 MED ORDER — FENTANYL CITRATE 0.05 MG/ML IJ SOLN
INTRAMUSCULAR | Status: AC
  Filled 2014-03-20: qty 5

## 2014-03-20 MED ORDER — OXYCODONE HCL 5 MG/5ML PO SOLN: 5.0000 mg | Freq: Once | ORAL | Status: DC | PRN

## 2014-03-20 MED ORDER — LACTATED RINGERS IV SOLN
INTRAVENOUS | Status: DC
  Administered 2014-03-20: 15:00:00 via INTRAVENOUS

## 2014-03-20 MED ORDER — BUPIVACAINE HCL (PF) 0.25 % IJ SOLN
INTRAMUSCULAR | Status: AC
  Filled 2014-03-20: qty 30

## 2014-03-20 MED ORDER — NEOSTIGMINE METHYLSULFATE 10 MG/10ML IV SOLN
INTRAVENOUS | Status: DC | PRN
  Administered 2014-03-20: 5 mg via INTRAVENOUS

## 2014-03-20 MED ORDER — IOHEXOL 300 MG/ML  SOLN
25.0000 mL | INTRAMUSCULAR | Status: AC
  Administered 2014-03-20: 25 mL via ORAL

## 2014-03-20 MED ORDER — SUCCINYLCHOLINE CHLORIDE 20 MG/ML IJ SOLN
INTRAMUSCULAR | Status: DC | PRN
  Administered 2014-03-20: 120 mg via INTRAVENOUS

## 2014-03-20 MED ORDER — BUPIVACAINE-EPINEPHRINE 0.25% -1:200000 IJ SOLN
INTRAMUSCULAR | Status: DC | PRN
  Administered 2014-03-20: 30 mL

## 2014-03-20 MED ORDER — MIDAZOLAM HCL 2 MG/2ML IJ SOLN
INTRAMUSCULAR | Status: AC
  Filled 2014-03-20: qty 2

## 2014-03-20 MED ORDER — LISINOPRIL 40 MG PO TABS
40.0000 mg | ORAL_TABLET | Freq: Every day | ORAL | Status: DC
  Administered 2014-03-21: 40 mg via ORAL
  Filled 2014-03-20: qty 1

## 2014-03-20 MED ORDER — DEXAMETHASONE SODIUM PHOSPHATE 4 MG/ML IJ SOLN
INTRAMUSCULAR | Status: DC | PRN
  Administered 2014-03-20: 4 mg via INTRAVENOUS

## 2014-03-20 MED ORDER — PROPOFOL 10 MG/ML IV BOLUS
INTRAVENOUS | Status: DC | PRN
  Administered 2014-03-20: 200 mg via INTRAVENOUS
  Administered 2014-03-20: 30 mg via INTRAVENOUS

## 2014-03-20 MED ORDER — 0.9 % SODIUM CHLORIDE (POUR BTL) OPTIME
TOPICAL | Status: DC | PRN
  Administered 2014-03-20: 1000 mL

## 2014-03-20 MED ORDER — ROCURONIUM BROMIDE 100 MG/10ML IV SOLN
INTRAVENOUS | Status: DC | PRN
  Administered 2014-03-20: 30 mg via INTRAVENOUS

## 2014-03-20 MED ORDER — PIPERACILLIN-TAZOBACTAM 3.375 G IVPB 30 MIN
3.3750 g | Freq: Once | INTRAVENOUS | Status: AC
  Administered 2014-03-20: 3.375 g via INTRAVENOUS
  Filled 2014-03-20: qty 50

## 2014-03-20 MED ORDER — PANTOPRAZOLE SODIUM 40 MG IV SOLR
40.0000 mg | INTRAVENOUS | Status: DC
  Administered 2014-03-20: 40 mg via INTRAVENOUS
  Filled 2014-03-20 (×2): qty 40

## 2014-03-20 MED ORDER — LIDOCAINE HCL (CARDIAC) 20 MG/ML IV SOLN
INTRAVENOUS | Status: DC | PRN
  Administered 2014-03-20: 50 mg via INTRAVENOUS

## 2014-03-20 MED ORDER — IOHEXOL 300 MG/ML  SOLN
100.0000 mL | Freq: Once | INTRAMUSCULAR | Status: AC | PRN
  Administered 2014-03-20: 100 mL via INTRAVENOUS

## 2014-03-20 MED ORDER — SODIUM CHLORIDE 0.9 % IR SOLN
Status: DC | PRN
  Administered 2014-03-20: 1000 mL

## 2014-03-20 SURGICAL SUPPLY — 47 items
APPLIER CLIP 5 13 M/L LIGAMAX5 (MISCELLANEOUS) ×3
BANDAGE ADH SHEER 1  50/CT (GAUZE/BANDAGES/DRESSINGS) ×9 IMPLANT
BENZOIN TINCTURE PRP APPL 2/3 (GAUZE/BANDAGES/DRESSINGS) ×3 IMPLANT
BLADE SURG ROTATE 9660 (MISCELLANEOUS) ×3 IMPLANT
CANISTER SUCTION 2500CC (MISCELLANEOUS) ×3 IMPLANT
CHLORAPREP W/TINT 26ML (MISCELLANEOUS) ×3 IMPLANT
CLIP APPLIE 5 13 M/L LIGAMAX5 (MISCELLANEOUS) ×2 IMPLANT
COVER MAYO STAND STRL (DRAPES) IMPLANT
COVER SURGICAL LIGHT HANDLE (MISCELLANEOUS) ×3 IMPLANT
DRAPE C-ARM 42X72 X-RAY (DRAPES) IMPLANT
DRAPE LAPAROSCOPIC ABDOMINAL (DRAPES) ×3 IMPLANT
DRSG TEGADERM 4X4.75 (GAUZE/BANDAGES/DRESSINGS) ×3 IMPLANT
ELECT REM PT RETURN 9FT ADLT (ELECTROSURGICAL) ×3
ELECTRODE REM PT RTRN 9FT ADLT (ELECTROSURGICAL) ×2 IMPLANT
GAUZE SPONGE 2X2 8PLY STRL LF (GAUZE/BANDAGES/DRESSINGS) ×2 IMPLANT
GLOVE BIOGEL M STRL SZ7.5 (GLOVE) ×3 IMPLANT
GLOVE BIOGEL PI IND STRL 6.5 (GLOVE) ×4 IMPLANT
GLOVE BIOGEL PI IND STRL 7.0 (GLOVE) ×2 IMPLANT
GLOVE BIOGEL PI IND STRL 8 (GLOVE) ×2 IMPLANT
GLOVE BIOGEL PI INDICATOR 6.5 (GLOVE) ×2
GLOVE BIOGEL PI INDICATOR 7.0 (GLOVE) ×1
GLOVE BIOGEL PI INDICATOR 8 (GLOVE) ×1
GLOVE SURG SS PI 6.5 STRL IVOR (GLOVE) ×6 IMPLANT
GLOVE SURG SS PI 7.0 STRL IVOR (GLOVE) ×3 IMPLANT
GOWN STRL REUS W/ TWL LRG LVL3 (GOWN DISPOSABLE) ×6 IMPLANT
GOWN STRL REUS W/ TWL XL LVL3 (GOWN DISPOSABLE) ×2 IMPLANT
GOWN STRL REUS W/TWL LRG LVL3 (GOWN DISPOSABLE) ×3
GOWN STRL REUS W/TWL XL LVL3 (GOWN DISPOSABLE) ×1
KIT BASIN OR (CUSTOM PROCEDURE TRAY) ×3 IMPLANT
KIT ROOM TURNOVER OR (KITS) ×3 IMPLANT
NS IRRIG 1000ML POUR BTL (IV SOLUTION) ×3 IMPLANT
PAD ARMBOARD 7.5X6 YLW CONV (MISCELLANEOUS) ×3 IMPLANT
POUCH SPECIMEN RETRIEVAL 10MM (ENDOMECHANICALS) ×3 IMPLANT
SCISSORS LAP 5X35 DISP (ENDOMECHANICALS) ×3 IMPLANT
SET CHOLANGIOGRAPH 5 50 .035 (SET/KITS/TRAYS/PACK) IMPLANT
SET IRRIG TUBING LAPAROSCOPIC (IRRIGATION / IRRIGATOR) ×3 IMPLANT
SLEEVE ENDOPATH XCEL 5M (ENDOMECHANICALS) ×6 IMPLANT
SPECIMEN JAR SMALL (MISCELLANEOUS) ×3 IMPLANT
SPONGE GAUZE 2X2 STER 10/PKG (GAUZE/BANDAGES/DRESSINGS) ×1
STRIP CLOSURE SKIN 1/2X4 (GAUZE/BANDAGES/DRESSINGS) ×3 IMPLANT
SUT MNCRL AB 4-0 PS2 18 (SUTURE) ×3 IMPLANT
TOWEL OR 17X24 6PK STRL BLUE (TOWEL DISPOSABLE) IMPLANT
TOWEL OR 17X26 10 PK STRL BLUE (TOWEL DISPOSABLE) ×3 IMPLANT
TRAY LAPAROSCOPIC (CUSTOM PROCEDURE TRAY) ×3 IMPLANT
TROCAR XCEL BLUNT TIP 100MML (ENDOMECHANICALS) ×3 IMPLANT
TROCAR XCEL NON-BLD 5MMX100MML (ENDOMECHANICALS) ×3 IMPLANT
TUBING INSUFFLATION (TUBING) ×3 IMPLANT

## 2014-03-20 NOTE — Op Note (Signed)
Vincent CanFred B Salvetti [id number] 03/19/1966 03/20/2014  Laparoscopic Cholecystectomy Procedure Note  Indications: This patient presents with symptomatic gallbladder disease and will undergo laparoscopic cholecystectomy.  Pre-operative Diagnosis: Calculus of gallbladder with acute cholecystitis, without mention of obstruction  Post-operative Diagnosis: Same  Surgeon: Atilano InaWILSON,Lewanna Petrak M   Assistants: none  Anesthesia: General endotracheal anesthesia  ASA Class: 3  Procedure Details  The patient was seen again in the Holding Room. The risks, benefits, complications, treatment options, and expected outcomes were discussed with the patient. The possibilities of reaction to medication, pulmonary aspiration, perforation of viscus, bleeding, recurrent infection, finding a normal gallbladder, the need for additional procedures, failure to diagnose a condition, the possible need to convert to an open procedure, and creating a complication requiring transfusion or operation were discussed with the patient. The likelihood of improving the patient's symptoms with return to their baseline status is good.  The patient and/or family concurred with the proposed plan, giving informed consent. The site of surgery properly noted. The patient was taken to Operating Room, identified as Vincent Clarke and the procedure verified as Laparoscopic Cholecystectomy with Intraoperative Cholangiogram. A Time Out was held and the above information confirmed. Antibiotic prophylaxis was administered.   Prior to the induction of general anesthesia, antibiotic prophylaxis was administered. General endotracheal anesthesia was then administered and tolerated well. After the induction, the abdomen was prepped with Chloraprep and draped in the sterile fashion. The patient was positioned in the supine position.  Local anesthetic agent was injected into the skin near the umbilicus and an incision made. We dissected down to the abdominal fascia with  blunt dissection.  The fascia was incised vertically and we entered the peritoneal cavity bluntly.  A pursestring suture of 0-Vicryl was placed around the fascial opening.  The Hasson cannula was inserted and secured with the stay suture.  Pneumoperitoneum was then created with CO2 and tolerated well without any adverse changes in the patient's vital signs. An 5-mm port was placed in the subxiphoid position.  Two 5-mm ports were placed in the right upper quadrant. All skin incisions were infiltrated with a local anesthetic agent before making the incision and placing the trocars.   We positioned the patient in reverse Trendelenburg, tilted slightly to the patient's left. There were a few omental adhesions to the anterior abdominal wall near falciform which were taken down.  The gallbladder was identified, the fundus grasped and retracted cephalad. Adhesions were lysed bluntly and with the electrocautery where indicated, taking care not to injure any adjacent organs or viscus. The gallbladder wall was edematous.  The infundibulum was grasped and retracted laterally, exposing the peritoneum overlying the triangle of Calot. This was then divided and exposed in a blunt fashion. A critical view of the cystic duct and cystic artery (anterior and post branch) was obtained.  The cystic duct was clearly identified and bluntly dissected circumferentially. The cystic duct was ligated with a clip distally.   An incision was made in the cystic duct and the [facility] cholangiogram catheter couldnot be introduced - the duct was too diminutive.The catheter was then removed.   The cystic duct was then ligated with clips and divided. The cystic artery which had been identified & dissected free was ligated with 2 clips on each branch proximally and divided as well.   The gallbladder was dissected from the liver bed in retrograde fashion with the electrocautery. The gallbladder was removed and placed in an Endocatch sac.  The  gallbladder and Endocatch sac were then  removed through the umbilical port site. The liver bed was irrigated and inspected. Hemostasis was achieved with the electrocautery. Copious irrigation was utilized and was repeatedly aspirated until clear.  The pursestring suture was used to close the umbilical fascia.    We again inspected the right upper quadrant for hemostasis.  The umbilical closure was inspected and there was no air leak and nothing trapped within the closure. Pneumoperitoneum was released as we removed the trocars.  4-0 Monocryl was used to close the skin.   Benzoin, steri-strips, and clean dressings were applied. The patient was then extubated and brought to the recovery room in stable condition. Instrument, sponge, and needle counts were correct at closure and at the conclusion of the case.   Findings: Cholecystitis with Cholelithiasis  Estimated Blood Loss: Minimal         Drains: none         Specimens: Gallbladder           Complications: None; patient tolerated the procedure well.         Disposition: PACU - hemodynamically stable.         Condition: stable  Leighton Ruff. Redmond Pulling, MD, FACS General, Bariatric, & Minimally Invasive Surgery [facility], [place]

## 2014-03-20 NOTE — Anesthesia Preprocedure Evaluation (Addendum)
Anesthesia Evaluation  Patient identified by MRN, date of birth, ID band Patient awake    Reviewed: Allergy & Precautions, H&P , NPO status   History of Anesthesia Complications Negative for: history of anesthetic complications  Airway Mallampati: II  TM Distance: >3 FB Neck ROM: full    Dental  (+) Edentulous Upper, Edentulous Lower, Dental Advisory Given   Pulmonary asthma , Current Smoker,  + rhonchi         Cardiovascular hypertension, Pt. on medications Rhythm:Regular Rate:Normal     Neuro/Psych    GI/Hepatic Neg liver ROS, GERD-  ,  Endo/Other  negative endocrine ROS  Renal/GU negative Renal ROS     Musculoskeletal negative musculoskeletal ROS (+)   Abdominal   Peds  Hematology   Anesthesia Other Findings   Reproductive/Obstetrics negative OB ROS                            Anesthesia Physical Anesthesia Plan  ASA: II  Anesthesia Plan: General   Post-op Pain Management:    Induction: Intravenous  Airway Management Planned: Oral ETT  Additional Equipment:   Intra-op Plan:   Post-operative Plan: Extubation in OR  Informed Consent: I have reviewed the patients History and Physical, chart, labs and discussed the procedure including the risks, benefits and alternatives for the proposed anesthesia with the patient or authorized representative who has indicated his/her understanding and acceptance.     Plan Discussed with: CRNA, Anesthesiologist and Surgeon  Anesthesia Plan Comments:         Anesthesia Quick Evaluation

## 2014-03-20 NOTE — ED Notes (Signed)
Per EMS, pt began having RUQ pain 3 hours ago. Pt has nausea with vomiting, pt denies diarrhea. Pt received of fentanyl in route with no relief to the pain. Pt also received 4 mg of zofran in route which relieved the nausea. Pt alert x4. HR: 60, R: 18 BP: 160/90

## 2014-03-20 NOTE — Anesthesia Postprocedure Evaluation (Signed)
Anesthesia Post Note  Patient: Vincent CanFred B Clarke  Procedure(s) Performed: Procedure(s) (LRB): LAPAROSCOPIC CHOLECYSTECTOMY (N/A)  Anesthesia type: General  Patient location: PACU  Post pain: Pain level controlled and Adequate analgesia  Post assessment: Post-op Vital signs reviewed, Patient's Cardiovascular Status Stable, Respiratory Function Stable, Patent Airway and Pain level controlled  Last Vitals:  Filed Vitals:   03/20/14 1815  BP:   Pulse: 80  Temp: 36.6 C  Resp: 18    Post vital signs: Reviewed and stable  Level of consciousness: awake, alert  and oriented  Complications: No apparent anesthesia complications

## 2014-03-20 NOTE — Transfer of Care (Signed)
Immediate Anesthesia Transfer of Care Note  Patient: Vincent Clarke  Procedure(s) Performed: Procedure(s): LAPAROSCOPIC CHOLECYSTECTOMY (N/A)  Patient Location: PACU  Anesthesia Type:General  Level of Consciousness: oriented, sedated and patient cooperative  Airway & Oxygen Therapy: Patient Spontanous Breathing and Patient connected to nasal cannula oxygen  Post-op Assessment: Report given to PACU RN and Post -op Vital signs reviewed and stable  Post vital signs: Reviewed  Complications: No apparent anesthesia complications

## 2014-03-20 NOTE — ED Provider Notes (Signed)
CSN: [id number]     Arrival date & time 03/20/14  [id number] History   First MD Initiated Contact with Patient 03/20/14 [phone]     Chief Complaint  Patient presents with  . Abdominal Pain  . Chest Pain     (Consider location/radiation/quality/duration/timing/severity/associated sxs/prior Treatment) HPI   48 year old male with abdominal pain. Mild ache when he woke up this morning. Became severely worse while he was getting dressed. Constant since then. Pain medicine that right upper quadrant and epigastrium. Radiates through to his back. No appreciable exacerbating relieving factors. Associated with nausea. No vomiting. No history similar type symptoms. No fevers or chills. No diarrhea. She denies alcohol abuse. No history of any prior abdominal surgery. No shortness of breath. No cough. Reports that he is diffusely told he had "a stomach ulcer."  Past Medical History  Diagnosis Date  . Back pain   . Hypertension   . GERD (gastroesophageal reflux disease)    Past Surgical History  Procedure Laterality Date  . Appendectomy     No family history on file. History  Substance Use Topics  . Smoking status: Current Every Day Smoker -- 0.50 packs/day    Types: Cigarettes  . Smokeless tobacco: Not on file  . Alcohol Use: Yes     Comment: occ    Review of Systems  All systems reviewed and negative, other than as noted in HPI.   Allergies  Tramadol  Home Medications   Prior to Admission medications   Medication Sig Start Date End Date Taking? Authorizing Provider  lisinopril (PRINIVIL,ZESTRIL) 40 MG tablet Take 40 mg by mouth daily.   Yes Historical Provider, MD  omeprazole (PRILOSEC OTC) 20 MG tablet Take 20 mg by mouth daily.   Yes Historical Provider, MD  HYDROcodone-acetaminophen (NORCO/VICODIN) 5-325 MG per tablet Take 1 tablet by mouth at bedtime as needed. 04/21/13   Trixie DredgeEmily West, PA-C  HYDROcodone-homatropine [facility](HYCODAN) 5-1.5 MG/5ML syrup Take 5 mLs by mouth every 6 (six) hours as  needed for cough. 12/04/13   Arthor CaptainAbigail Harris, PA-C  methocarbamol (ROBAXIN) 500 MG tablet Take 1 tablet (500 mg total) by mouth every 6 (six) hours as needed for muscle spasms. 04/21/13   Trixie DredgeEmily West, PA-C  predniSONE (DELTASONE) 20 MG tablet Take 2 tablets (40 mg total) by mouth daily. 12/04/13   Abigail Harris, PA-C   BP 158/95 mmHg  Pulse 63  Temp(Src) 97.8 F (36.6 C) (Oral)  Resp 20  Ht 5\' 8"  (1.727 m)  Wt 175 lb (79.379 kg)  BMI 26.61 kg/m2  SpO2 99% Physical Exam  Constitutional: He appears well-developed and well-nourished. No distress.  HENT:  Head: Normocephalic and atraumatic.  Eyes: Conjunctivae are normal. Pupils are equal, round, and reactive to light. Right eye exhibits no discharge. Left eye exhibits no discharge.  Neck: Neck supple.  Cardiovascular: Normal rate, regular rhythm and normal heart sounds.  Exam reveals no gallop and no friction rub.   No murmur heard. Pulmonary/Chest: Effort normal and breath sounds normal. No respiratory distress.  Abdominal: Soft. He exhibits no distension. There is no tenderness.  Points to RUQ/epigastrium as area of pain, but diffusely tender and guarding with palpation diffusely. No distension. No surgical scars noted.   Musculoskeletal: He exhibits no edema or tenderness.  Neurological: He is alert.  Skin: Skin is warm and dry.  Psychiatric: He has a normal mood and affect. His behavior is normal. Thought content normal.  Nursing note and vitals reviewed.   ED Course  Procedures (including critical  care time) Labs Review Labs Reviewed  CBC WITH DIFFERENTIAL - Abnormal; Notable for the following:    WBC 12.9 (*)    Neutro Abs 9.1 (*)    All other components within normal limits  COMPREHENSIVE METABOLIC PANEL  LIPASE, BLOOD  URINALYSIS, ROUTINE W REFLEX MICROSCOPIC    Imaging Review Ct Abdomen Pelvis W Contrast  03/20/2014   CLINICAL DATA:  Right upper quadrant pain for 3 hours. Nausea and vomiting.  EXAM: CT ABDOMEN AND  PELVIS WITH CONTRAST  TECHNIQUE: Multidetector CT imaging of the abdomen and pelvis was performed using the standard protocol following bolus administration of intravenous contrast.  CONTRAST:  100mL OMNIPAQUE IOHEXOL 300 MG/ML  SOLN  COMPARISON:  March 08, 2011  FINDINGS: There is a 1.6 cm stone in the gallbladder neck. The gallbladder wall is thickened with pericholecystic fluid.  The liver, spleen, pancreas, adrenal glands and kidneys are normal. There is no hydronephrosis bilaterally. There is atherosclerosis of the abdominal aorta without aneurysmal dilatation. There is no abdominal lymphadenopathy. There is a minimal hiatal hernia. There is no small bowel obstruction or diverticulitis. The appendix has been surgically removed.  Fluid-filled bladder is normal. Prostate calcifications are noted. There is dependent atelectasis of the posterior lung bases. Degenerative joint changes of the spine are noted.  IMPRESSION: Gallbladder wall thickening with para cholecystic fluid and a 1.6 cm stone in the gallbladder neck. Findings are suspicious for cholecystitis.  These results will be called to the ordering clinician or representative by the Radiologist Assistant, and communication documented in the PACS or zVision Dashboard.   Electronically Signed   By: Sherian ReinWei-Chen  Lin M.D.   On: 03/20/2014 12:44   Dg Abd Acute W/chest  03/20/2014   CLINICAL DATA:  Passed out this am. Sweaty and dizzy/ unable to stand/ no chest pain or sob/ mid abd pain since 6:30 this morning/hx asthma/ smoker- down to 1/2 ppd 20+ yrs  EXAM: ACUTE ABDOMEN SERIES (ABDOMEN 2 VIEW & CHEST 1 VIEW)  COMPARISON:  Chest x-ray on 12/04/2013  FINDINGS: The heart is accentuated by the AP position but appears mildly enlarged. There are no focal consolidations or pleural effusions. No free intraperitoneal air beneath diaphragm.  There is mild dilatation of small bowel loops. Colonic loops are not dilated. No evidence for organomegaly. Scattered phleboliths  are identified in the pelvis. Visualized osseous structures have a normal appearance.  IMPRESSION: 1.  No evidence for acute cardiopulmonary abnormality. 2. Mild dilatation of small bowel loops consistent with early or partial small-bowel obstruction.   Electronically Signed   By: Rosalie GumsBeth  Brown M.D.   On: 03/20/2014 11:18     EKG Interpretation None      MDM   Final diagnoses:  Abdominal pain  Cholecystitis, acute    48yM with abdominal pain. Very uncomfortable. Some concern for possible perforated ulcer/viscus as he is guarding with palpation everywhere although reports pain focally worse in RUQ/epigastrium. Reports hx of "ulcer." Will obtain plain films and then CT if no evidence of free air. NPO. Symptom control. UA, labs.   CT with large stone in GB neck and signs of cholecystitis. Surgery consulted.     Raeford RazorStephen Marda Breidenbach, MD 03/20/14 1339

## 2014-03-20 NOTE — Plan of Care (Signed)
Problem: Phase I Progression Outcomes Goal: Pain controlled with appropriate interventions Outcome: Completed/Met Date Met:  03/20/14 Goal: OOB as tolerated unless otherwise ordered Outcome: Completed/Met Date Met:  03/20/14 Goal: Incision/dressings dry and intact Outcome: Completed/Met Date Met:  03/20/14 Goal: Tubes/drains patent Outcome: Not Applicable Date Met:  03/20/14 Goal: Voiding-avoid urinary catheter unless indicated Outcome: Completed/Met Date Met:  03/20/14 Goal: Vital signs/hemodynamically stable Outcome: Completed/Met Date Met:  03/20/14     

## 2014-03-20 NOTE — ED Notes (Signed)
Patient transported to CT 

## 2014-03-20 NOTE — Anesthesia Procedure Notes (Signed)
Procedure Name: Intubation Date/Time: 03/20/2014 3:22 PM Performed by: Lovie CholOCK, Ziyanna Tolin K Pre-anesthesia Checklist: Patient identified, Emergency Drugs available, Suction available, Patient being monitored and Timeout performed Patient Re-evaluated:Patient Re-evaluated prior to inductionOxygen Delivery Method: Circle system utilized Preoxygenation: Pre-oxygenation with 100% oxygen Intubation Type: IV induction Laryngoscope Size: Miller and 2 Grade View: Grade I Tube type: Oral Tube size: 7.5 mm Number of attempts: 1 Airway Equipment and Method: Stylet Placement Confirmation: ETT inserted through vocal cords under direct vision,  positive ETCO2,  CO2 detector and breath sounds checked- equal and bilateral Secured at: 21 cm Tube secured with: Tape Dental Injury: Teeth and Oropharynx as per pre-operative assessment

## 2014-03-20 NOTE — H&P (Signed)
Vincent Clarke 19-Jul-1965  [id number].   Chief Complaint/Reason for Consult: abdominal pain HPI: This is a 48 yo white male who has a h/o HTN and asthma who began having RUQ abdominal pain this morning.  It started acutely after he awoke this morning.  No fevers.  He did have one episode of nausea/vomiting.  He states he had a syncopal episode this am at home due to pain.  Because of this, he came to the [facility] where he was evaluate.  He had a CT scan that revealed a gallstone lodged in the neck of the gallbladder with wall thickening and pericholecystic fluid.  We have been asked to evaluate the patient for admission.  ROS: Please see HPI, otherwise all other systems are negative.  History reviewed. No pertinent family history.  Past Medical History  Diagnosis Date  . Back pain   . Hypertension   . GERD (gastroesophageal reflux disease)   . Asthma     Past Surgical History  Procedure Laterality Date  . Appendectomy      Social History:  reports that he has been smoking Cigarettes.  He has been smoking about 0.50 packs per day. He does not have any smokeless tobacco history on file. He reports that he drinks alcohol. He reports that he does not use illicit drugs.  Allergies:  Allergies  Allergen Reactions  . Tramadol      (Not in a hospital admission)  Blood pressure 157/89, pulse 66, temperature 97.8 F (36.6 C), temperature source Oral, resp. rate 25, height 5' 8"  (1.727 m), weight 175 lb (79.379 kg), SpO2 95 %. Physical Exam: General: pleasant, WD, WN white male who is laying in bed in NAD HEENT: head is normocephalic, atraumatic.  Sclera are noninjected.  PERRL.  Ears and nose without any masses or lesions.  Mouth is pink and moist Heart: regular, rate, and rhythm.  Normal s1,s2. No obvious murmurs, gallops, or rubs noted.  Palpable radial and pedal pulses bilaterally Lungs: CTAB, no wheezes, rhonchi, or rales noted.  Respiratory effort nonlabored Abd: soft, tender in RUQ  with + Murphy's sign, ND, +BS, no masses, hernias, or organomegaly MS: all 4 extremities are symmetrical with no cyanosis, clubbing, or edema. Skin: warm and dry with no masses, lesions, or rashes Psych: A&Ox3 with an appropriate affect.    Results for orders placed or performed during the hospital encounter of 03/20/14 (from the past 48 hour(s))  Comprehensive metabolic panel     Status: None   Collection Time: 03/20/14 10:30 AM  Result Value Ref Range   Sodium 138 137 - 147 mEq/L   Potassium 3.9 3.7 - 5.3 mEq/L   Chloride 101 96 - 112 mEq/L   CO2 23 19 - 32 mEq/L   Glucose, Bld 99 70 - 99 mg/dL   BUN 8 6 - 23 mg/dL   Creatinine, Ser 0.86 0.50 - 1.35 mg/dL   Calcium 9.7 8.4 - 10.5 mg/dL   Total Protein 7.1 6.0 - 8.3 g/dL   Albumin 3.8 3.5 - 5.2 g/dL   AST 17 0 - 37 U/L   ALT 20 0 - 53 U/L   Alkaline Phosphatase 98 39 - 117 U/L   Total Bilirubin 0.3 0.3 - 1.2 mg/dL   GFR calc non Af Amer >90 >90 mL/min   GFR calc Af Amer >90 >90 mL/min    Comment: (NOTE) The eGFR has been calculated using the CKD EPI equation. This calculation has not been validated in all clinical situations.  eGFR's persistently <90 mL/min signify possible Chronic Kidney Disease.    Anion gap 14 5 - 15  Lipase, blood     Status: None   Collection Time: 03/20/14 10:30 AM  Result Value Ref Range   Lipase 25 11 - 59 U/L  CBC with Differential     Status: Abnormal   Collection Time: 03/20/14 10:30 AM  Result Value Ref Range   WBC 12.9 (H) 4.0 - 10.5 K/uL   RBC 5.18 4.22 - 5.81 MIL/uL   Hemoglobin 15.9 13.0 - 17.0 g/dL   HCT 45.3 39.0 - 52.0 %   MCV 87.5 78.0 - 100.0 fL   MCH 30.7 26.0 - 34.0 pg   MCHC 35.1 30.0 - 36.0 g/dL   RDW 12.6 11.5 - 15.5 %   Platelets 374 150 - 400 K/uL   Neutrophils Relative % 70 43 - 77 %   Neutro Abs 9.1 (H) 1.7 - 7.7 K/uL   Lymphocytes Relative 20 12 - 46 %   Lymphs Abs 2.5 0.7 - 4.0 K/uL   Monocytes Relative 8 3 - 12 %   Monocytes Absolute 1.0 0.1 - 1.0 K/uL    Eosinophils Relative 2 0 - 5 %   Eosinophils Absolute 0.3 0.0 - 0.7 K/uL   Basophils Relative 0 0 - 1 %   Basophils Absolute 0.0 0.0 - 0.1 K/uL   Ct Abdomen Pelvis W Contrast  03/20/2014   CLINICAL DATA:  Right upper quadrant pain for 3 hours. Nausea and vomiting.  EXAM: CT ABDOMEN AND PELVIS WITH CONTRAST  TECHNIQUE: Multidetector CT imaging of the abdomen and pelvis was performed using the standard protocol following bolus administration of intravenous contrast.  CONTRAST:  18m OMNIPAQUE IOHEXOL 300 MG/ML  SOLN  COMPARISON:  March 08, 2011  FINDINGS: There is a 1.6 cm stone in the gallbladder neck. The gallbladder wall is thickened with pericholecystic fluid.  The liver, spleen, pancreas, adrenal glands and kidneys are normal. There is no hydronephrosis bilaterally. There is atherosclerosis of the abdominal aorta without aneurysmal dilatation. There is no abdominal lymphadenopathy. There is a minimal hiatal hernia. There is no small bowel obstruction or diverticulitis. The appendix has been surgically removed.  Fluid-filled bladder is normal. Prostate calcifications are noted. There is dependent atelectasis of the posterior lung bases. Degenerative joint changes of the spine are noted.  IMPRESSION: Gallbladder wall thickening with para cholecystic fluid and a 1.6 cm stone in the gallbladder neck. Findings are suspicious for cholecystitis.  These results will be called to the ordering clinician or representative by the Radiologist Assistant, and communication documented in the PACS or zVision Dashboard.   Electronically Signed   By: WAbelardo DieselM.D.   On: 03/20/2014 12:44   Dg Abd Acute W/chest  03/20/2014   CLINICAL DATA:  Passed out this am. Sweaty and dizzy/ unable to stand/ no chest pain or sob/ mid abd pain since 6:30 this morning/hx asthma/ smoker- down to 1/2 ppd 20+ yrs  EXAM: ACUTE ABDOMEN SERIES (ABDOMEN 2 VIEW & CHEST 1 VIEW)  COMPARISON:  Chest x-ray on 12/04/2013  FINDINGS: The heart is  accentuated by the AP position but appears mildly enlarged. There are no focal consolidations or pleural effusions. No free intraperitoneal air beneath diaphragm.  There is mild dilatation of small bowel loops. Colonic loops are not dilated. No evidence for organomegaly. Scattered phleboliths are identified in the pelvis. Visualized osseous structures have a normal appearance.  IMPRESSION: 1.  No evidence for acute cardiopulmonary abnormality. 2.  Mild dilatation of small bowel loops consistent with early or partial small-bowel obstruction.   Electronically Signed   By: Shon Hale M.D.   On: 03/20/2014 11:18       Assessment/Plan 1. Acute cholecystitis 2. HTN 3. Tobacco abuse 4. Asthma  Plan: 1. The patient will be admitted for cholecystectomy.  I have discussed this with him.  He is agreeable to proceed.  He has already received a dose of zosyn.  He will remain NPO.  I have discussed the expected outcome and recovery with him as well.  Jaleia Hanke E 03/20/2014, 1:54 PM Pager: [phone]

## 2014-03-21 ENCOUNTER — Encounter (HOSPITAL_COMMUNITY): Payer: Self-pay | Admitting: General Surgery

## 2014-03-21 DIAGNOSIS — I10 Essential (primary) hypertension: Secondary | ICD-10-CM | POA: Diagnosis present

## 2014-03-21 DIAGNOSIS — K219 Gastro-esophageal reflux disease without esophagitis: Secondary | ICD-10-CM | POA: Diagnosis present

## 2014-03-21 DIAGNOSIS — J45909 Unspecified asthma, uncomplicated: Secondary | ICD-10-CM | POA: Diagnosis present

## 2014-03-21 DIAGNOSIS — F17200 Nicotine dependence, unspecified, uncomplicated: Secondary | ICD-10-CM | POA: Diagnosis present

## 2014-03-21 MED ORDER — OXYCODONE-ACETAMINOPHEN 5-325 MG PO TABS: 1.0000 | ORAL_TABLET | ORAL | Status: DC | PRN

## 2014-03-21 NOTE — Plan of Care (Signed)
Problem: Phase I Progression Outcomes Goal: Sutures/staples intact Outcome: Not Applicable Date Met:  38/25/05 Goal: Initial discharge plan identified Outcome: Completed/Met Date Met:  03/21/14 Goal: Other Phase I Outcomes/Goals Outcome: Not Applicable Date Met:  39/76/73  Problem: Phase II Progression Outcomes Goal: Pain controlled Outcome: Completed/Met Date Met:  03/21/14 Goal: Progress activity as tolerated unless otherwise ordered Outcome: Not Applicable Date Met:  41/93/79 Goal: Progressing with IS, TCDB Outcome: Completed/Met Date Met:  03/21/14 Goal: Vital signs stable Outcome: Completed/Met Date Met:  03/21/14 Goal: Surgical site without signs of infection Outcome: Completed/Met Date Met:  03/21/14 Goal: Dressings dry/intact Outcome: Completed/Met Date Met:  03/21/14 Goal: Sutures/staples intact Outcome: Not Applicable Date Met:  02/40/97 Goal: Return of bowel function (flatus, BM) IF ABDOMINAL SURGERY:  Outcome: Completed/Met Date Met:  03/21/14 Goal: Foley discontinued Outcome: Not Applicable Date Met:  35/32/99 Goal: Discharge plan established Outcome: Completed/Met Date Met:  03/21/14 Goal: Tolerating diet Outcome: Completed/Met Date Met:  03/21/14 Goal: Other Phase II Outcomes/Goals Outcome: Not Applicable Date Met:  24/26/83  Problem: Phase III Progression Outcomes Goal: Pain controlled on oral analgesia Outcome: Completed/Met Date Met:  03/21/14 Goal: Activity at appropriate level-compared to baseline (UP IN CHAIR FOR HEMODIALYSIS)  Outcome: Completed/Met Date Met:  03/21/14 Goal: Voiding independently Outcome: Completed/Met Date Met:  03/21/14 Goal: IV changed to normal saline lock Outcome: Completed/Met Date Met:  03/21/14 Goal: Nasogastric tube discontinued Outcome: Not Applicable Date Met:  41/96/22 Goal: Discharge plan remains appropriate-arrangements made Outcome: Completed/Met Date Met:  03/21/14 Goal: Demonstrates TCDB, IS  independently Outcome: Completed/Met Date Met:  03/21/14 Goal: Other Phase III Outcomes/Goals Outcome: Not Applicable Date Met:  29/79/89  Problem: Discharge Progression Outcomes Goal: Barriers To Progression Addressed/Resolved Outcome: Completed/Met Date Met:  03/21/14 Goal: Discharge plan in place and appropriate Outcome: Completed/Met Date Met:  03/21/14 Goal: Pain controlled with appropriate interventions Outcome: Completed/Met Date Met:  03/21/14 Goal: Hemodynamically stable Outcome: Completed/Met Date Met:  21/19/41 Goal: Complications resolved/controlled Outcome: Completed/Met Date Met:  03/21/14 Goal: Tolerating diet Outcome: Completed/Met Date Met:  03/21/14 Goal: Activity appropriate for discharge plan Outcome: Completed/Met Date Met:  03/21/14 Goal: Tubes and drains discontinued if indicated Outcome: Not Applicable Date Met:  74/08/14 Goal: Staples/sutures removed Outcome: Not Applicable Date Met:  48/18/56 Goal: Steri-Strips applied Outcome: Completed/Met Date Met:  03/21/14 Goal: Other Discharge Outcomes/Goals Outcome: Not Applicable Date Met:  31/49/70

## 2014-03-21 NOTE — Progress Notes (Signed)
Pt. Received discharge instructions and prescription for percocet. Educated pt. On to remove band-aid tomorrow morning. Steri strips are underneath them. Explained to pt. That steri-strips will roll up and fall off on their own within 7-10 days. Pt. Is aware of lifting restrictions and appropriate diet. Educated pt. On follow-up appointments. Removed IV. No skin issues noted. All questions answered. No further needs noted at this time.

## 2014-03-21 NOTE — Discharge Summary (Signed)
Physician Discharge Summary  Vincent Clarke ZOX:[id number] DOB: 05/04/1966 DOA: 03/20/2014  PCP: Vincent Clarke,Vincent Glover OLIVER, MD  Admit date: 03/20/2014 Discharge date: 03/21/2014  Recommendations for Outpatient Follow-up:   Follow-up Information    [facility]   Why:  3:15pm, arrive no later than 2:45pm for paperwork   Contact information:   [address]   [place] [address] [phone]      Discharge Diagnoses:  Principal Problem:   Acute cholecystitis Active Problems:   HTN (hypertension)   Asthma, chronic   GERD (gastroesophageal reflux disease)   Tobacco use disorder  Surgical Procedure: laparoscopic cholecystectomy  Discharge Condition: good Disposition: home  Diet recommendation: regular  Filed Weights   03/20/14 0959 03/20/14 1828  Weight: 175 lb (79.379 kg) 178 lb 6.4 oz (80.922 kg)    History of present illness:  This is a 48 yo white male who has a h/o HTN and asthma who began having RUQ abdominal pain this morning. It started acutely after he awoke this morning. No fevers. He did have one episode of nausea/vomiting. He states he had a syncopal episode this am at home due to pain. [facility] of this[facility] He had a CT scan that revealed a gallstone lodged in the neck of the gallbladder with wall thickening and pericholecystic fluid. We have been asked to evaluate the patient for admission.  Hospital Course:  Pt admitted for observation and taken to OR on 11/9 for lap cholecystectomy. See op note. Post op he was kept for pain control and observation. On POD 1 he was doing well. He was ambulating without difficulty. His vitals were stable. He was tolerating a diet. He had voided. His pain was well controlled. We discussed d/c instructions.   BP 112/67 mmHg  Pulse 63  Temp(Src) 97.9 F (36.6 C) (Oral)  Resp 16  Ht 5\' 8"  (1.727 m)  Wt 178 lb 6.4 oz (80.922 kg)  BMI 27.13  kg/m2  SpO2 96%  Gen: alert, NAD, non-toxic appearing Pupils: equal, no scleral icterus Pulm: Lungs clear to auscultation, symmetric chest rise CV: regular rate and rhythm Abd: soft, nontender, nondistended. Dressing c/d/i  No cellulitis. No incisional hernia Ext: no edema, no calf tenderness Skin: no rash, no jaundice    Discharge Instructions      Discharge Instructions    Discharge instructions    Complete by:  As directed   See [facility] discharge instructions     Increase activity slowly    Complete by:  As directed             Medication List    STOP taking these medications        HYDROcodone-acetaminophen 5-325 MG per tablet  Commonly known as:  NORCO/VICODIN     HYDROcodone-homatropine 5-1.5 MG/5ML syrup  Commonly known as:  HYCODAN      TAKE these medications        lisinopril 40 MG tablet  Commonly known as:  PRINIVIL,ZESTRIL  Take 40 mg by mouth daily.     methocarbamol 500 MG tablet  Commonly known as:  ROBAXIN  Take 1 tablet (500 mg total) by mouth every 6 (six) hours as needed for muscle spasms.     omeprazole 20 MG tablet  Commonly known as:  PRILOSEC OTC  Take 20 mg by mouth daily.     oxyCODONE-acetaminophen 5-325 MG per tablet  Commonly known  as:  PERCOCET/ROXICET  Take 1-2 tablets by mouth every 4 (four) hours as needed for moderate pain.       Follow-up Information    [facility] [place] [facility]2014.   Why:  3:15pm, arrive no later than 2:45pm for paperwork   Contact information:   [address]   [place] [address] [phone]        The results of significant diagnostics from this hospitalization (including imaging, microbiology, ancillary and laboratory) are listed below for reference.    Significant Diagnostic Studies: Ct Abdomen Pelvis W Contrast  03/20/2014   CLINICAL DATA:  Right upper quadrant pain for 3 hours. Nausea and vomiting.  EXAM: CT ABDOMEN AND PELVIS WITH CONTRAST   TECHNIQUE: Multidetector CT imaging of the abdomen and pelvis was performed using the standard protocol following bolus administration of intravenous contrast.  CONTRAST:  100mL OMNIPAQUE IOHEXOL 300 MG/ML  SOLN  COMPARISON:  March 08, 2011  FINDINGS: There is a 1.6 cm stone in the gallbladder neck. The gallbladder wall is thickened with pericholecystic fluid.  The liver, spleen, pancreas, adrenal glands and kidneys are normal. There is no hydronephrosis bilaterally. There is atherosclerosis of the abdominal aorta without aneurysmal dilatation. There is no abdominal lymphadenopathy. There is a minimal hiatal hernia. There is no small bowel obstruction or diverticulitis. The appendix has been surgically removed.  Fluid-filled bladder is normal. Prostate calcifications are noted. There is dependent atelectasis of the posterior lung bases. Degenerative joint changes of the spine are noted.  IMPRESSION: Gallbladder wall thickening with para cholecystic fluid and a 1.6 cm stone in the gallbladder neck. Findings are suspicious for cholecystitis.  These results will be called to the ordering clinician or representative by the Radiologist Assistant, and communication documented in the PACS or zVision Dashboard.   Electronically Signed   By: Sherian ReinWei-Chen  Lin M.D.   On: 03/20/2014 12:44   Dg Abd Acute W/chest  03/20/2014   CLINICAL DATA:  Passed out this am. Sweaty and dizzy/ unable to stand/ no chest pain or sob/ mid abd pain since 6:30 this morning/hx asthma/ smoker- down to 1/2 ppd 20+ yrs  EXAM: ACUTE ABDOMEN SERIES (ABDOMEN 2 VIEW & CHEST 1 VIEW)  COMPARISON:  Chest x-ray on 12/04/2013  FINDINGS: The heart is accentuated by the AP position but appears mildly enlarged. There are no focal consolidations or pleural effusions. No free intraperitoneal air beneath diaphragm.  There is mild dilatation of small bowel loops. Colonic loops are not dilated. No evidence for organomegaly. Scattered phleboliths are identified in the  pelvis. Visualized osseous structures have a normal appearance.  IMPRESSION: 1.  No evidence for acute cardiopulmonary abnormality. 2. Mild dilatation of small bowel loops consistent with early or partial small-bowel obstruction.   Electronically Signed   By: Rosalie GumsBeth  Brown M.D.   On: 03/20/2014 11:18    Microbiology: No results found for this or any previous visit (from the past 240 hour(s)).   Labs: Basic Metabolic Panel:  Recent Labs Lab 03/20/14 1030  NA 138  K 3.9  CL 101  CO2 23  GLUCOSE 99  BUN 8  CREATININE 0.86  CALCIUM 9.7   Liver Function Tests:  Recent Labs Lab 03/20/14 1030  AST 17  ALT 20  ALKPHOS 98  BILITOT 0.3  PROT 7.1  ALBUMIN 3.8    Recent Labs Lab 03/20/14 1030  LIPASE 25   No results for input(s): AMMONIA in the last 168  hours. CBC:  Recent Labs Lab 03/20/14 1030  WBC 12.9*  NEUTROABS 9.1*  HGB 15.9  HCT 45.3  MCV 87.5  PLT 374   Cardiac Enzymes: No results for input(s): CKTOTAL, CKMB, CKMBINDEX, TROPONINI in the last 168 hours. BNP: BNP (last 3 results) No results for input(s): PROBNP in the last 8760 hours. CBG: No results for input(s): GLUCAP in the last 168 hours.  Principal Problem:   Acute cholecystitis Active Problems:   HTN (hypertension)   Asthma, chronic   GERD (gastroesophageal reflux disease)   Tobacco use disorder   Time coordinating discharge: 10 minutes  Signed:  Atilano Ina, MD [facility], [place] [phone] 03/21/2014, 8:11 AM

## 2014-03-21 NOTE — Care Management Note (Signed)
    Page 1 of 1   03/21/2014     12:06:49 PM CARE MANAGEMENT NOTE 03/21/2014  Patient:  Vincent Clarke,Vincent Clarke   Account Number:  [id number]  Date Initiated:  03/21/2014  Documentation initiated by:  Letha CapeAYLOR,Odaly Peri  Subjective/Objective Assessment:   dx acute cholecystitis  admit as observation     Action/Plan:   Anticipated DC Date:  03/21/2014   Anticipated DC Plan:  HOME/SELF CARE      DC Planning Services  CM consult      Choice offered to / List presented to:             Status of service:  Completed, signed off Medicare Important Message given?  NO (If response is "NO", the following Medicare IM given date fields will be blank) Date Medicare IM given:   Medicare IM given by:   Date Additional Medicare IM given:   Additional Medicare IM given by:    Discharge Disposition:  HOME/SELF CARE  Per UR Regulation:  Reviewed for med. necessity/level of care/duration of stay  If discussed at Long Length of Stay Meetings, dates discussed:    Comments:  03/21/14 1205 Letha Capeeborah Thressa Shiffer RN, BSN [phone] NCM spoke with Delcine RN, she states patient did not need help with medications.  No CM referral, patient dc before NCM saw patient.

## 2014-03-21 NOTE — Discharge Instructions (Signed)
[facility] °LAPAROSCOPIC SURGERY: POST OP INSTRUCTIONS °Always review your discharge instruction sheet given to you by the facility where your surgery was performed. °IF YOU HAVE DISABILITY OR FAMILY LEAVE FORMS, YOU MUST BRING THEM TO THE OFFICE FOR PROCESSING.   °DO NOT GIVE THEM TO YOUR DOCTOR. ° °1. A prescription for pain medication may be given to you upon discharge.  Take your pain medication as prescribed, if needed.  If narcotic pain medicine is not needed, then you may take acetaminophen (Tylenol) or ibuprofen (Advil) as needed. °2. Take your usually prescribed medications unless otherwise directed. °3. If you need a refill on your pain medication, please contact your pharmacy.  They will contact our office to request authorization. Prescriptions will not be filled after 5pm or on week-ends. °4. You should follow a light diet the first few days after arrival home, such as soup and crackers, etc.  Be sure to include lots of fluids daily. °5. Most patients will experience some swelling and bruising in the area of the incisions.  Ice packs will help.  Swelling and bruising can take several days to resolve.  °6. It is common to experience some constipation if taking pain medication after surgery.  Increasing fluid intake and taking a stool softener (such as Colace) will usually help or prevent this problem from occurring.  A mild laxative (Milk of Magnesia or Miralax) should be taken according to package instructions if there are no bowel movements after 48 hours. °7. Unless discharge instructions indicate otherwise, you may remove your bandages 24-48 hours after surgery, and you may shower at that time.  You may have steri-strips (small skin tapes) in place directly over the incision.  These strips should be left on the skin for 7-10 days.   °8. ACTIVITIES:  You may resume regular (light) daily activities beginning the next day--such as daily self-care, walking, climbing  stairs--gradually increasing activities as tolerated.  You may have sexual intercourse when it is comfortable.  Refrain from any heavy lifting or straining until approved by your doctor. °a. You may drive when you are no longer taking prescription pain medication, you can comfortably wear a seatbelt, and you can safely maneuver your car and apply brakes. °9. You should see your doctor in the office for a follow-up appointment approximately 2-3 weeks after your surgery.  Make sure that you call for this appointment within a day or two after you arrive home to insure a convenient appointment time. °10. OTHER INSTRUCTIONS:  °WHEN TO CALL YOUR DOCTOR: °1. Fever over 101.0 °2. Inability to urinate °3. Continued bleeding from incision. °4. Increased pain, redness, or drainage from the incision. °5. Increasing abdominal pain ° °The clinic staff is available to answer your questions during regular business hours.  Please don’t hesitate to call and ask to speak to one of the nurses for clinical concerns.  If you have a medical emergency, go to the nearest emergency room or call 911.  A surgeon from [facility] is always on call at the hospital. °[address], [address], [place], [place]  [address] ? [address] [address], , [place]   [address] °[phone] ? [phone] ? FAX [phone] °Web site: [email or web address] ° °

## 2014-03-30 NOTE — Progress Notes (Signed)
[facility]ist was not able to see the patient, [facility] orange card information will be sent to the address provided. °

## 2014-12-11 DIAGNOSIS — I739 Peripheral vascular disease, unspecified: Secondary | ICD-10-CM

## 2014-12-11 HISTORY — DX: Peripheral vascular disease, unspecified: I73.9

## 2014-12-18 ENCOUNTER — Emergency Department (HOSPITAL_COMMUNITY): Payer: No Typology Code available for payment source | Admitting: Certified Registered Nurse Anesthetist

## 2014-12-18 ENCOUNTER — Emergency Department (HOSPITAL_COMMUNITY): Payer: No Typology Code available for payment source

## 2014-12-18 ENCOUNTER — Encounter (HOSPITAL_COMMUNITY)
Admission: EM | Disposition: A | Discharge status: Home / Self Care | Payer: Self-pay | Source: Home / Self Care | Attending: Orthopedic Surgery

## 2014-12-18 ENCOUNTER — Encounter (HOSPITAL_COMMUNITY): Payer: Self-pay | Admitting: Physical Medicine and Rehabilitation

## 2014-12-18 ENCOUNTER — Inpatient Hospital Stay (HOSPITAL_COMMUNITY)
Admission: EM | Admit: 2014-12-18 | Discharge: 2014-12-27 | DRG: 907 | Disposition: A | Discharge status: Home / Self Care | Payer: No Typology Code available for payment source | Attending: Orthopedic Surgery | Admitting: Orthopedic Surgery

## 2014-12-18 DIAGNOSIS — S82252B Displaced comminuted fracture of shaft of left tibia, initial encounter for open fracture type I or II: Secondary | ICD-10-CM | POA: Diagnosis present

## 2014-12-18 DIAGNOSIS — W3400XA Accidental discharge from unspecified firearms or gun, initial encounter: Secondary | ICD-10-CM

## 2014-12-18 DIAGNOSIS — J45909 Unspecified asthma, uncomplicated: Secondary | ICD-10-CM | POA: Diagnosis present

## 2014-12-18 DIAGNOSIS — E876 Hypokalemia: Secondary | ICD-10-CM | POA: Diagnosis not present

## 2014-12-18 DIAGNOSIS — S81839A Puncture wound without foreign body, unspecified lower leg, initial encounter: Secondary | ICD-10-CM

## 2014-12-18 DIAGNOSIS — S82402B Unspecified fracture of shaft of left fibula, initial encounter for open fracture type I or II: Secondary | ICD-10-CM

## 2014-12-18 DIAGNOSIS — S85002A Unspecified injury of popliteal artery, left leg, initial encounter: Secondary | ICD-10-CM | POA: Diagnosis not present

## 2014-12-18 DIAGNOSIS — Z419 Encounter for procedure for purposes other than remedying health state, unspecified: Secondary | ICD-10-CM

## 2014-12-18 DIAGNOSIS — S82202B Unspecified fracture of shaft of left tibia, initial encounter for open fracture type I or II: Secondary | ICD-10-CM

## 2014-12-18 DIAGNOSIS — K219 Gastro-esophageal reflux disease without esophagitis: Secondary | ICD-10-CM | POA: Diagnosis present

## 2014-12-18 DIAGNOSIS — S82899A Other fracture of unspecified lower leg, initial encounter for closed fracture: Secondary | ICD-10-CM

## 2014-12-18 DIAGNOSIS — R739 Hyperglycemia, unspecified: Secondary | ICD-10-CM | POA: Diagnosis not present

## 2014-12-18 DIAGNOSIS — F17210 Nicotine dependence, cigarettes, uncomplicated: Secondary | ICD-10-CM | POA: Diagnosis present

## 2014-12-18 DIAGNOSIS — T82898A Other specified complication of vascular prosthetic devices, implants and grafts, initial encounter: Secondary | ICD-10-CM | POA: Diagnosis present

## 2014-12-18 DIAGNOSIS — I998 Other disorder of circulatory system: Secondary | ICD-10-CM | POA: Diagnosis present

## 2014-12-18 DIAGNOSIS — T148 Other injury of unspecified body region: Secondary | ICD-10-CM | POA: Diagnosis not present

## 2014-12-18 DIAGNOSIS — I10 Essential (primary) hypertension: Secondary | ICD-10-CM | POA: Diagnosis present

## 2014-12-18 DIAGNOSIS — S82452B Displaced comminuted fracture of shaft of left fibula, initial encounter for open fracture type I or II: Secondary | ICD-10-CM | POA: Diagnosis present

## 2014-12-18 DIAGNOSIS — S82202A Unspecified fracture of shaft of left tibia, initial encounter for closed fracture: Secondary | ICD-10-CM

## 2014-12-18 DIAGNOSIS — D62 Acute posthemorrhagic anemia: Secondary | ICD-10-CM | POA: Diagnosis not present

## 2014-12-18 HISTORY — PX: BYPASS GRAFT POPLITEAL TO TIBIAL: SHX5764

## 2014-12-18 HISTORY — PX: EXTERNAL FIXATION LEG: SHX1549

## 2014-12-18 LAB — CBC WITH DIFFERENTIAL/PLATELET
Basophils Absolute: 0.1 10*3/uL (ref 0.0–0.1)
Basophils Relative: 1 % (ref 0–1)
Eosinophils Absolute: 0.2 10*3/uL (ref 0.0–0.7)
Eosinophils Relative: 1 % (ref 0–5)
HCT: 38.4 % — ABNORMAL LOW (ref 39.0–52.0)
Hemoglobin: 13.3 g/dL (ref 13.0–17.0)
Lymphocytes Relative: 19 % (ref 12–46)
Lymphs Abs: 2.9 10*3/uL (ref 0.7–4.0)
MCH: 29.4 pg (ref 26.0–34.0)
MCHC: 34.6 g/dL (ref 30.0–36.0)
MCV: 84.8 fL (ref 78.0–100.0)
Monocytes Absolute: 1.3 10*3/uL — ABNORMAL HIGH (ref 0.1–1.0)
Monocytes Relative: 8 % (ref 3–12)
Neutro Abs: 10.9 10*3/uL — ABNORMAL HIGH (ref 1.7–7.7)
Neutrophils Relative %: 71 % (ref 43–77)
Platelets: 312 10*3/uL (ref 150–400)
RBC: 4.53 MIL/uL (ref 4.22–5.81)
RDW: 13.2 % (ref 11.5–15.5)
WBC: 15.3 10*3/uL — ABNORMAL HIGH (ref 4.0–10.5)

## 2014-12-18 LAB — COMPREHENSIVE METABOLIC PANEL
ALT: 17 U/L (ref 17–63)
AST: 32 U/L (ref 15–41)
Albumin: 3.2 g/dL — ABNORMAL LOW (ref 3.5–5.0)
Alkaline Phosphatase: 78 U/L (ref 38–126)
Anion gap: 7 (ref 5–15)
BUN: <5 mg/dL — ABNORMAL LOW (ref 6–20)
CO2: 24 mmol/L (ref 22–32)
Calcium: 8.2 mg/dL — ABNORMAL LOW (ref 8.9–10.3)
Chloride: 107 mmol/L (ref 101–111)
Creatinine, Ser: 0.88 mg/dL (ref 0.61–1.24)
GFR calc Af Amer: >60 mL/min (ref >60)
GFR calc non Af Amer: >60 mL/min (ref >60)
Glucose, Bld: 114 mg/dL — ABNORMAL HIGH (ref 65–99)
Potassium: 3.1 mmol/L — ABNORMAL LOW (ref 3.5–5.1)
Sodium: 138 mmol/L (ref 135–145)
Total Bilirubin: 0.4 mg/dL (ref 0.3–1.2)
Total Protein: 5.9 g/dL — ABNORMAL LOW (ref 6.5–8.1)

## 2014-12-18 SURGERY — EXTERNAL FIXATION, LOWER EXTREMITY
Anesthesia: General | Site: Leg Lower | Laterality: Left

## 2014-12-18 SURGERY — CREATION, BYPASS, ARTERIAL, POPLITEAL TO TIBIAL, USING GRAFT
Anesthesia: General | Site: Leg Lower | Laterality: Left

## 2014-12-18 MED ORDER — HYDROMORPHONE HCL 1 MG/ML IJ SOLN
1.0000 mg | Freq: Once | INTRAMUSCULAR | Status: DC
  Filled 2014-12-18 (×2): qty 1

## 2014-12-18 MED ORDER — PROPOFOL 10 MG/ML IV BOLUS
0.5000 mg/kg | Freq: Once | INTRAVENOUS | Status: AC
  Administered 2014-12-18: 200 mg via INTRAVENOUS
  Filled 2014-12-18: qty 20

## 2014-12-18 MED ORDER — SUCCINYLCHOLINE CHLORIDE 20 MG/ML IJ SOLN
INTRAMUSCULAR | Status: DC | PRN
  Administered 2014-12-18: 120 mg via INTRAVENOUS

## 2014-12-18 MED ORDER — FENTANYL CITRATE (PF) 250 MCG/5ML IJ SOLN
INTRAMUSCULAR | Status: AC
  Filled 2014-12-18: qty 5

## 2014-12-18 MED ORDER — KETAMINE HCL 10 MG/ML IJ SOLN
0.5000 mg/kg | Freq: Once | INTRAMUSCULAR | Status: DC
  Filled 2014-12-18: qty 4.1

## 2014-12-18 MED ORDER — HYDROMORPHONE HCL 1 MG/ML IJ SOLN
INTRAMUSCULAR | Status: AC
  Administered 2014-12-18: 1 mg via INTRAVENOUS
  Filled 2014-12-18: qty 1

## 2014-12-18 MED ORDER — CEFAZOLIN SODIUM-DEXTROSE 2-3 GM-% IV SOLR
INTRAVENOUS | Status: DC | PRN
  Administered 2014-12-18 – 2014-12-19 (×3): 2 g via INTRAVENOUS

## 2014-12-18 MED ORDER — MIDAZOLAM HCL 2 MG/2ML IJ SOLN
INTRAMUSCULAR | Status: AC
  Filled 2014-12-18: qty 4

## 2014-12-18 MED ORDER — ONDANSETRON HCL 4 MG/2ML IJ SOLN
INTRAMUSCULAR | Status: DC | PRN
  Administered 2014-12-18: 4 mg via INTRAVENOUS

## 2014-12-18 MED ORDER — LACTATED RINGERS IV SOLN
INTRAVENOUS | Status: DC | PRN
Start: 2014-12-18 — End: 2014-12-19
  Administered 2014-12-18 – 2014-12-19 (×4): via INTRAVENOUS

## 2014-12-18 MED ORDER — ROCURONIUM BROMIDE 100 MG/10ML IV SOLN
INTRAVENOUS | Status: DC | PRN
  Administered 2014-12-18 – 2014-12-19 (×2): 30 mg via INTRAVENOUS

## 2014-12-18 MED ORDER — PROPOFOL 10 MG/ML IV BOLUS
INTRAVENOUS | Status: AC | PRN
  Administered 2014-12-18: 5 mg via INTRAVENOUS

## 2014-12-18 MED ORDER — LIDOCAINE HCL (CARDIAC) 20 MG/ML IV SOLN
INTRAVENOUS | Status: DC | PRN
  Administered 2014-12-18: 80 mg via INTRAVENOUS

## 2014-12-18 MED ORDER — EPHEDRINE SULFATE 50 MG/ML IJ SOLN
INTRAMUSCULAR | Status: DC | PRN
Start: 2014-12-18 — End: 2014-12-19
  Administered 2014-12-18: 5 mg via INTRAVENOUS
  Administered 2014-12-19 (×3): 10 mg via INTRAVENOUS
  Administered 2014-12-19: 15 mg via INTRAVENOUS

## 2014-12-18 MED ORDER — HYDROMORPHONE HCL 1 MG/ML IJ SOLN
1.0000 mg | Freq: Once | INTRAMUSCULAR | Status: AC
  Administered 2014-12-18 (×2): 1 mg via INTRAVENOUS
  Filled 2014-12-18: qty 1

## 2014-12-18 MED ORDER — HYDROMORPHONE HCL 1 MG/ML IJ SOLN
1.0000 mg | Freq: Once | INTRAMUSCULAR | Status: AC
  Administered 2014-12-18: 1 mg via INTRAVENOUS

## 2014-12-18 MED ORDER — KETAMINE HCL 10 MG/ML IJ SOLN
INTRAMUSCULAR | Status: AC | PRN
  Administered 2014-12-18: 5 mg via INTRAVENOUS
  Administered 2014-12-18 (×2): 2.5 mg via INTRAVENOUS

## 2014-12-18 MED ORDER — PHENYLEPHRINE HCL 10 MG/ML IJ SOLN
INTRAMUSCULAR | Status: DC | PRN
  Administered 2014-12-18: 80 ug via INTRAVENOUS
  Administered 2014-12-18 (×2): 120 ug via INTRAVENOUS
  Administered 2014-12-18: 80 ug via INTRAVENOUS
  Administered 2014-12-18 (×3): 120 ug via INTRAVENOUS
  Administered 2014-12-18: 80 ug via INTRAVENOUS
  Administered 2014-12-18: 120 ug via INTRAVENOUS
  Administered 2014-12-18: 80 ug via INTRAVENOUS
  Administered 2014-12-18 (×2): 120 ug via INTRAVENOUS

## 2014-12-18 MED ORDER — FENTANYL CITRATE (PF) 100 MCG/2ML IJ SOLN
INTRAMUSCULAR | Status: AC
  Administered 2014-12-18: 50 ug via INTRAVENOUS
  Administered 2014-12-18 – 2014-12-19 (×4): 100 ug via INTRAVENOUS
  Administered 2014-12-19: 50 ug via INTRAVENOUS
  Filled 2014-12-18: qty 2

## 2014-12-18 MED ORDER — PROPOFOL 10 MG/ML IV BOLUS
INTRAVENOUS | Status: AC
  Filled 2014-12-18: qty 20

## 2014-12-18 SURGICAL SUPPLY — 77 items
BANDAGE ESMARK 6X9 LF (GAUZE/BANDAGES/DRESSINGS) IMPLANT
BNDG ESMARK 6X9 LF (GAUZE/BANDAGES/DRESSINGS)
BNDG GAUZE ELAST 4 BULKY (GAUZE/BANDAGES/DRESSINGS) ×3 IMPLANT
CANISTER SUCTION 2500CC (MISCELLANEOUS) ×3 IMPLANT
CANNULA VESSEL 3MM 2 BLNT TIP (CANNULA) ×3 IMPLANT
CATH ACCU-VU SIZ PIG 5F 70CM (CATHETERS) ×3 IMPLANT
CATH CROSS OVER TEMPO 5F (CATHETERS) ×3 IMPLANT
CATH STRAIGHT 5FR 65CM (CATHETERS) ×3 IMPLANT
CLIP TI MEDIUM 24 (CLIP) ×3 IMPLANT
CLIP TI WIDE RED SMALL 24 (CLIP) ×6 IMPLANT
COVER DOME SNAP 22 D (MISCELLANEOUS) ×3 IMPLANT
CUFF TOURNIQUET SINGLE 24IN (TOURNIQUET CUFF) IMPLANT
CUFF TOURNIQUET SINGLE 34IN LL (TOURNIQUET CUFF) IMPLANT
CUFF TOURNIQUET SINGLE 44IN (TOURNIQUET CUFF) IMPLANT
DEVICE TORQUE KENDALL .025-038 (MISCELLANEOUS) ×3 IMPLANT
DRAIN SNY WOU (WOUND CARE) IMPLANT
DRAPE PROXIMA HALF (DRAPES) IMPLANT
DRAPE X-RAY CASS 24X20 (DRAPES) IMPLANT
DRESSING OPSITE X SMALL 2X3 (GAUZE/BANDAGES/DRESSINGS) ×3 IMPLANT
DRSG COVADERM 4X10 (GAUZE/BANDAGES/DRESSINGS) ×3 IMPLANT
DRSG COVADERM 4X14 (GAUZE/BANDAGES/DRESSINGS) ×3 IMPLANT
DRSG COVADERM 4X6 (GAUZE/BANDAGES/DRESSINGS) ×3 IMPLANT
ELECT REM PT RETURN 9FT ADLT (ELECTROSURGICAL) ×3
ELECTRODE REM PT RTRN 9FT ADLT (ELECTROSURGICAL) ×2 IMPLANT
EVACUATOR SILICONE 100CC (DRAIN) IMPLANT
GAUZE XEROFORM 1X8 LF (GAUZE/BANDAGES/DRESSINGS) ×3 IMPLANT
GLOVE BIO SURGEON STRL SZ 6.5 (GLOVE) ×6 IMPLANT
GLOVE BIO SURGEON STRL SZ7.5 (GLOVE) ×9 IMPLANT
GLOVE BIOGEL M 7.0 STRL (GLOVE) ×3 IMPLANT
GLOVE BIOGEL PI IND STRL 6.5 (GLOVE) ×4 IMPLANT
GLOVE BIOGEL PI IND STRL 7.0 (GLOVE) ×2 IMPLANT
GLOVE BIOGEL PI IND STRL 7.5 (GLOVE) ×4 IMPLANT
GLOVE BIOGEL PI INDICATOR 6.5 (GLOVE) ×2
GLOVE BIOGEL PI INDICATOR 7.0 (GLOVE) ×1
GLOVE BIOGEL PI INDICATOR 7.5 (GLOVE) ×2
GOWN EXTRA PROTECTION XL (GOWNS) ×3 IMPLANT
GOWN STRL REUS W/ TWL LRG LVL3 (GOWN DISPOSABLE) ×6 IMPLANT
GOWN STRL REUS W/TWL LRG LVL3 (GOWN DISPOSABLE) ×3
GUIDEWIRE ANGLED .035X150CM (WIRE) ×3 IMPLANT
KIT BASIN OR (CUSTOM PROCEDURE TRAY) ×3 IMPLANT
KIT ROOM TURNOVER OR (KITS) ×3 IMPLANT
LIQUID BAND (GAUZE/BANDAGES/DRESSINGS) ×3 IMPLANT
NEEDLE PERC 18GX7CM (NEEDLE) ×3 IMPLANT
NS IRRIG 1000ML POUR BTL (IV SOLUTION) ×6 IMPLANT
PACK PERIPHERAL VASCULAR (CUSTOM PROCEDURE TRAY) ×3 IMPLANT
PAD ARMBOARD 7.5X6 YLW CONV (MISCELLANEOUS) ×6 IMPLANT
PADDING CAST COTTON 6X4 STRL (CAST SUPPLIES) IMPLANT
SET COLLECT BLD 21X3/4 12 (NEEDLE) IMPLANT
SHEATH AVANTI 11CM 5FR (MISCELLANEOUS) ×6 IMPLANT
SPONGE GAUZE 4X4 12PLY STER LF (GAUZE/BANDAGES/DRESSINGS) ×6 IMPLANT
SPONGE SURGIFOAM ABS GEL 100 (HEMOSTASIS) IMPLANT
STAPLER VISISTAT (STAPLE) ×6 IMPLANT
STAPLER VISISTAT 35W (STAPLE) IMPLANT
STOPCOCK 4 WAY LG BORE MALE ST (IV SETS) IMPLANT
STOPCOCK MORSE 400PSI 3WAY (MISCELLANEOUS) ×3 IMPLANT
SUT ETHILON 3 0 PS 1 (SUTURE) ×9 IMPLANT
SUT PROLENE 5 0 C 1 24 (SUTURE) ×3 IMPLANT
SUT PROLENE 6 0 CC (SUTURE) ×9 IMPLANT
SUT PROLENE 7 0 BV 1 (SUTURE) ×6 IMPLANT
SUT PROLENE 7 0 BV1 MDA (SUTURE) ×3 IMPLANT
SUT SILK 2 0 FS (SUTURE) ×6 IMPLANT
SUT SILK 2 0 SH (SUTURE) ×6 IMPLANT
SUT SILK 3 0 (SUTURE) ×1
SUT SILK 3-0 18XBRD TIE 12 (SUTURE) ×2 IMPLANT
SUT VIC AB 2-0 CTX 36 (SUTURE) ×9 IMPLANT
SUT VIC AB 3-0 SH 27 (SUTURE) ×3
SUT VIC AB 3-0 SH 27X BRD (SUTURE) ×6 IMPLANT
SUT VICRYL 4-0 PS2 18IN ABS (SUTURE) ×9 IMPLANT
SYR MEDRAD MARK V 150ML (SYRINGE) ×3 IMPLANT
TAPE UMBILICAL 1/8 X36 TWILL (MISCELLANEOUS) ×3 IMPLANT
TAPE UMBILICAL COTTON 1/8X30 (MISCELLANEOUS) IMPLANT
TRAY FOLEY W/METER SILVER 16FR (SET/KITS/TRAYS/PACK) ×3 IMPLANT
TUBING EXTENTION W/L.L. (IV SETS) IMPLANT
TUBING HIGH PRESSURE 120CM (CONNECTOR) ×6 IMPLANT
UNDERPAD 30X30 INCONTINENT (UNDERPADS AND DIAPERS) ×3 IMPLANT
WATER STERILE IRR 1000ML POUR (IV SOLUTION) ×3 IMPLANT
WIRE BENTSON .035X145CM (WIRE) ×3 IMPLANT

## 2014-12-18 SURGICAL SUPPLY — 68 items
BANDAGE ELASTIC 4 VELCRO ST LF (GAUZE/BANDAGES/DRESSINGS) ×2 IMPLANT
BANDAGE ELASTIC 6 VELCRO ST LF (GAUZE/BANDAGES/DRESSINGS) ×2 IMPLANT
BANDAGE ESMARK 6X9 LF (GAUZE/BANDAGES/DRESSINGS) ×1 IMPLANT
BAR GLASS FIBER EXFX 11X350 (EXFIX) ×4 IMPLANT
BNDG COHESIVE 6X5 TAN STRL LF (GAUZE/BANDAGES/DRESSINGS) ×2 IMPLANT
BNDG ESMARK 6X9 LF (GAUZE/BANDAGES/DRESSINGS) ×2
BNDG GAUZE ELAST 4 BULKY (GAUZE/BANDAGES/DRESSINGS) ×2 IMPLANT
BRUSH SCRUB DISP (MISCELLANEOUS) ×4 IMPLANT
CLAMP PIN 45MM 1 BAR (EXFIX) ×4 IMPLANT
CLEANER TIP ELECTROSURG 2X2 (MISCELLANEOUS) ×2 IMPLANT
COVER SURGICAL LIGHT HANDLE (MISCELLANEOUS) ×4 IMPLANT
CUFF TOURNIQUET SINGLE 18IN (TOURNIQUET CUFF) IMPLANT
CUFF TOURNIQUET SINGLE 24IN (TOURNIQUET CUFF) IMPLANT
CUFF TOURNIQUET SINGLE 34IN LL (TOURNIQUET CUFF) IMPLANT
DRAPE C-ARM 42X72 X-RAY (DRAPES) IMPLANT
DRAPE C-ARMOR (DRAPES) ×2 IMPLANT
DRAPE U-SHAPE 47X51 STRL (DRAPES) ×2 IMPLANT
DRSG ADAPTIC 3X8 NADH LF (GAUZE/BANDAGES/DRESSINGS) ×2 IMPLANT
DRSG PAD ABDOMINAL 8X10 ST (GAUZE/BANDAGES/DRESSINGS) ×2 IMPLANT
ELECT REM PT RETURN 9FT ADLT (ELECTROSURGICAL) ×2
ELECTRODE REM PT RTRN 9FT ADLT (ELECTROSURGICAL) ×1 IMPLANT
EVACUATOR 1/8 PVC DRAIN (DRAIN) IMPLANT
GAUZE SPONGE 4X4 12PLY STRL (GAUZE/BANDAGES/DRESSINGS) ×2 IMPLANT
GAUZE XEROFORM 1X8 LF (GAUZE/BANDAGES/DRESSINGS) ×2 IMPLANT
GLOVE BIO SURGEON STRL SZ7.5 (GLOVE) ×2 IMPLANT
GLOVE BIO SURGEON STRL SZ8 (GLOVE) ×4 IMPLANT
GLOVE BIOGEL PI IND STRL 7.5 (GLOVE) ×1 IMPLANT
GLOVE BIOGEL PI IND STRL 8 (GLOVE) ×1 IMPLANT
GLOVE BIOGEL PI INDICATOR 7.5 (GLOVE) ×1
GLOVE BIOGEL PI INDICATOR 8 (GLOVE) ×1
GOWN STRL REUS W/ TWL LRG LVL3 (GOWN DISPOSABLE) ×2 IMPLANT
GOWN STRL REUS W/ TWL XL LVL3 (GOWN DISPOSABLE) ×1 IMPLANT
GOWN STRL REUS W/TWL LRG LVL3 (GOWN DISPOSABLE) ×2
GOWN STRL REUS W/TWL XL LVL3 (GOWN DISPOSABLE) ×1
HANDPIECE INTERPULSE COAX TIP (DISPOSABLE)
KIT BASIN OR (CUSTOM PROCEDURE TRAY) ×2 IMPLANT
KIT ROOM TURNOVER OR (KITS) ×2 IMPLANT
MANIFOLD NEPTUNE II (INSTRUMENTS) ×2 IMPLANT
NEEDLE 22X1 1/2 (OR ONLY) (NEEDLE) IMPLANT
NS IRRIG 1000ML POUR BTL (IV SOLUTION) ×2 IMPLANT
PACK ORTHO EXTREMITY (CUSTOM PROCEDURE TRAY) ×2 IMPLANT
PAD ARMBOARD 7.5X6 YLW CONV (MISCELLANEOUS) ×4 IMPLANT
PADDING CAST COTTON 6X4 STRL (CAST SUPPLIES) ×6 IMPLANT
PIN CLAMP 2BAR 75MM BLUE (PIN) ×2 IMPLANT
PIN HALF YELLOW 5X160X35 (PIN) ×4 IMPLANT
PIN TRANSFIXING 5.0 (PIN) ×4 IMPLANT
SET HNDPC FAN SPRY TIP SCT (DISPOSABLE) IMPLANT
SPONGE GAUZE 4X4 12PLY STER LF (GAUZE/BANDAGES/DRESSINGS) ×2 IMPLANT
SPONGE LAP 18X18 X RAY DECT (DISPOSABLE) ×2 IMPLANT
SPONGE SCRUB IODOPHOR (GAUZE/BANDAGES/DRESSINGS) ×2 IMPLANT
STAPLER VISISTAT 35W (STAPLE) IMPLANT
STOCKINETTE IMPERVIOUS LG (DRAPES) ×2 IMPLANT
STRIP CLOSURE SKIN 1/2X4 (GAUZE/BANDAGES/DRESSINGS) IMPLANT
SUCTION FRAZIER TIP 10 FR DISP (SUCTIONS) IMPLANT
SUT ETHILON 3 0 PS 1 (SUTURE) IMPLANT
SUT VIC AB 0 CT1 27 (SUTURE) ×2
SUT VIC AB 0 CT1 27XBRD ANBCTR (SUTURE) ×2 IMPLANT
SUT VIC AB 2-0 CT1 27 (SUTURE) ×2
SUT VIC AB 2-0 CT1 TAPERPNT 27 (SUTURE) ×2 IMPLANT
SYR CONTROL 10ML LL (SYRINGE) IMPLANT
TOWEL OR 17X24 6PK STRL BLUE (TOWEL DISPOSABLE) ×4 IMPLANT
TOWEL OR 17X26 10 PK STRL BLUE (TOWEL DISPOSABLE) ×4 IMPLANT
TRAY FOLEY W/METER SILVER 16FR (SET/KITS/TRAYS/PACK) ×2 IMPLANT
TUBE CONNECTING 12X1/4 (SUCTIONS) ×2 IMPLANT
UNDERPAD 30X30 INCONTINENT (UNDERPADS AND DIAPERS) ×2 IMPLANT
WATER STERILE IRR 1000ML POUR (IV SOLUTION) ×4 IMPLANT
XTRAFIX 11MM BAR X 350 ×4 IMPLANT
YANKAUER SUCT BULB TIP NO VENT (SUCTIONS) ×2 IMPLANT
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
## 2014-12-18 NOTE — Anesthesia Procedure Notes (Signed)
Procedure Name: Intubation Date/Time: 12/18/2014 8:47 PM Performed by: Adonis Housekeeper Pre-anesthesia Checklist: Patient identified, Emergency Drugs available, Suction available, Patient being monitored and Timeout performed Patient Re-evaluated:Patient Re-evaluated prior to inductionOxygen Delivery Method: Circle system utilized Preoxygenation: Pre-oxygenation with 100% oxygen Intubation Type: IV induction, Rapid sequence and Cricoid Pressure applied Laryngoscope Size: Miller and 2 Grade View: Grade I Tube type: Oral Tube size: 7.0 mm Number of attempts: 1 Airway Equipment and Method: Stylet Placement Confirmation: ETT inserted through vocal cords under direct vision,  positive ETCO2 and breath sounds checked- equal and bilateral Secured at: 22 cm Tube secured with: Tape Dental Injury: Teeth and Oropharynx as per pre-operative assessment

## 2014-12-18 NOTE — Op Note (Signed)
12/18/2014  9:56 PM  PATIENT:  Vincent Clarke    PRE-OPERATIVE DIAGNOSIS:  Left Tib-Fib Fracture  POST-OPERATIVE DIAGNOSIS:  Same  PROCEDURE:  EXTERNAL FIXATION LEG  SURGEON:  Nike Southers, Jewel Baize, MD  ASSISTANT: Janalee Dane, PA-C, She was present and scrubbed throughout the case, critical for completion in a timely fashion, and for retraction, instrumentation, and closure.   ANESTHESIA:   gen  PREOPERATIVE INDICATIONS:  Vincent Clarke is a  49 y.o. male with a diagnosis of Left Tib-Fib Fracture who failed conservative measures and elected for surgical management.    The risks benefits and alternatives were discussed with the patient preoperatively including but not limited to the risks of infection, bleeding, nerve injury, cardiopulmonary complications, the need for revision surgery, among others, and the patient was willing to proceed.  OPERATIVE IMPLANTS: Zimmer Ex-fix  OPERATIVE FINDINGS: unstable Pilon fracture  BLOOD LOSS: min  COMPLICATIONS: loss of pulse at conslusion of the case  TOURNIQUET TIME: none  OPERATIVE PROCEDURE:  Patient was identified in the preoperative holding area and site was marked by me He was transported to the operating theater and placed on the table in supine position taking care to pad all bony prominences. After a preincinduction time out anesthesia was induced. The left lower extremity was prepped and draped in normal sterile fashion and a pre-incision timeout was performed. He received ancef for preoperative antibiotics.   In the active prepping and draping his foot was held still throughout I placed no circumferential dressings or drapes on his leg.  He was posted emergently and per staffing there was a we were able to get him up as quickly as possible.  After prepping and draping the left lower extremity I placed anterior pins in the proximal tibial diaphyseal region. I used fluoroscopic guidance.  Next I placed 2 transfix pins in the  calcaneus.  Next I assembled the delta frame around his ankle. I was careful not to pull too much traction given his tenuous vessel status.  I line him up with what I felt was appropriate anatomic length and did leave him just slightly short of this to allow some relaxation of his vessels.  I took multiple x-rays and was happy with his reduction. I tightened final tightened all of the ex-fix. I then placed a sterile dressing drapes were removed.  At this point we attempted a Doppler his pulses again and were unable to Doppler anterior tibia or sorry posterior tibial or dorsalis pedis vessels. I also had Dr. Janee Morn attempt this and he was also unable to Doppler vessels. At this point we spoke with Dr. Darrick Penna who is planning to perform an arteriogram in room 16 patient will be kept intubated and transported to 16 please see Dr. Darrick Penna note for the remainder of this case.  POST OPERATIVE PLAN: I will turn this patient over to the care of Dr. fields postoperatively he'll be admitted to trauma. I have no contraindication to whatever vascular and trauma decide for DVT prophylaxis. He will eventually need definitive fixation of his distal tibia.    This note was generated using a template and dragon dictation system. In light of that, I have reviewed the note and all aspects of it are applicable to this case. Any dictation errors are due to the computerized dictation system.

## 2014-12-18 NOTE — Op Note (Signed)
Dr. Janee Morn in to evaluate foot with Dr. Eulah Pont using doppler.
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
## 2014-12-18 NOTE — ED Notes (Signed)
Dr. Janee Morn and Dr. Effie Shy at bedside
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
## 2014-12-18 NOTE — Progress Notes (Signed)
Orthopedic Tech Progress Note Patient Details:  Vincent Clarke 12/05/1965 [id number]  Ortho Devices Type of Ortho Device: Stirrup splint, Post (short) splint Splint Material: Fiberglass Ortho Device/Splint Interventions: Application   Cammer, Mickie Bail 12/18/2014, 7:16 PM

## 2014-12-18 NOTE — Anesthesia Preprocedure Evaluation (Signed)
Anesthesia Evaluation  Patient identified by MRN, date of birth, ID band Patient awake    Reviewed: Allergy & Precautions, NPO status , Patient's Chart, lab work & pertinent test results  Airway Mallampati: II  TM Distance: >3 FB Neck ROM: Full    Dental  (+) Teeth Intact, Dental Advisory Given   Pulmonary asthma , Current Smoker,  breath sounds clear to auscultation        Cardiovascular hypertension, Pt. on medications Rhythm:Regular Rate:Normal     Neuro/Psych    GI/Hepatic GERD-  Medicated and Controlled,  Endo/Other    Renal/GU      Musculoskeletal   Abdominal   Peds  Hematology   Anesthesia Other Findings   Reproductive/Obstetrics                             Anesthesia Physical Anesthesia Plan  ASA: II and emergent  Anesthesia Plan: General   Post-op Pain Management:    Induction: Intravenous, Rapid sequence and Cricoid pressure planned  Airway Management Planned: Oral ETT  Additional Equipment:   Intra-op Plan:   Post-operative Plan: Extubation in OR  Informed Consent: I have reviewed the patients History and Physical, chart, labs and discussed the procedure including the risks, benefits and alternatives for the proposed anesthesia with the patient or authorized representative who has indicated his/her understanding and acceptance.   Dental advisory given  Plan Discussed with: CRNA, Anesthesiologist and Surgeon  Anesthesia Plan Comments:         Anesthesia Quick Evaluation
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
## 2014-12-18 NOTE — ED Notes (Signed)
Dr. Effie Shy and ortho tech at bedside to place splint to L ankle. Pt tolerating without difficulty.

## 2014-12-19 ENCOUNTER — Inpatient Hospital Stay (HOSPITAL_COMMUNITY): Payer: No Typology Code available for payment source

## 2014-12-19 ENCOUNTER — Encounter (HOSPITAL_COMMUNITY): Payer: Self-pay | Admitting: Vascular Surgery

## 2014-12-19 DIAGNOSIS — T82898A Other specified complication of vascular prosthetic devices, implants and grafts, initial encounter: Secondary | ICD-10-CM | POA: Diagnosis present

## 2014-12-19 DIAGNOSIS — S81839A Puncture wound without foreign body, unspecified lower leg, initial encounter: Secondary | ICD-10-CM

## 2014-12-19 DIAGNOSIS — S82252B Displaced comminuted fracture of shaft of left tibia, initial encounter for open fracture type I or II: Secondary | ICD-10-CM | POA: Diagnosis present

## 2014-12-19 DIAGNOSIS — D62 Acute posthemorrhagic anemia: Secondary | ICD-10-CM | POA: Diagnosis not present

## 2014-12-19 DIAGNOSIS — F17210 Nicotine dependence, cigarettes, uncomplicated: Secondary | ICD-10-CM | POA: Diagnosis present

## 2014-12-19 DIAGNOSIS — S85002A Unspecified injury of popliteal artery, left leg, initial encounter: Secondary | ICD-10-CM | POA: Diagnosis present

## 2014-12-19 DIAGNOSIS — S82452B Displaced comminuted fracture of shaft of left fibula, initial encounter for open fracture type I or II: Secondary | ICD-10-CM | POA: Diagnosis present

## 2014-12-19 DIAGNOSIS — R739 Hyperglycemia, unspecified: Secondary | ICD-10-CM | POA: Diagnosis not present

## 2014-12-19 DIAGNOSIS — E876 Hypokalemia: Secondary | ICD-10-CM | POA: Diagnosis not present

## 2014-12-19 DIAGNOSIS — S75892A Other specified injury of other blood vessels at hip and thigh level, left leg, initial encounter: Secondary | ICD-10-CM

## 2014-12-19 DIAGNOSIS — W3400XA Accidental discharge from unspecified firearms or gun, initial encounter: Secondary | ICD-10-CM

## 2014-12-19 DIAGNOSIS — I998 Other disorder of circulatory system: Secondary | ICD-10-CM | POA: Diagnosis present

## 2014-12-19 DIAGNOSIS — J45909 Unspecified asthma, uncomplicated: Secondary | ICD-10-CM | POA: Diagnosis present

## 2014-12-19 DIAGNOSIS — T148 Other injury of unspecified body region: Secondary | ICD-10-CM | POA: Diagnosis present

## 2014-12-19 DIAGNOSIS — K219 Gastro-esophageal reflux disease without esophagitis: Secondary | ICD-10-CM | POA: Diagnosis present

## 2014-12-19 DIAGNOSIS — I10 Essential (primary) hypertension: Secondary | ICD-10-CM | POA: Diagnosis present

## 2014-12-19 HISTORY — DX: Other specified complication of vascular prosthetic devices, implants and grafts, initial encounter: T82.898A

## 2014-12-19 HISTORY — DX: Puncture wound without foreign body, unspecified lower leg, initial encounter: S81.839A

## 2014-12-19 LAB — URINALYSIS, ROUTINE W REFLEX MICROSCOPIC
Bilirubin Urine: NEGATIVE
Glucose, UA: NEGATIVE mg/dL
Hgb urine dipstick: NEGATIVE
Ketones, ur: NEGATIVE mg/dL
Leukocytes, UA: NEGATIVE
Nitrite: NEGATIVE
Protein, ur: NEGATIVE mg/dL
Specific Gravity, Urine: 1.024 (ref 1.005–1.030)
Urobilinogen, UA: 1 mg/dL (ref 0.0–1.0)
pH: 8 (ref 5.0–8.0)

## 2014-12-19 LAB — CBC
HCT: 34.6 % — ABNORMAL LOW (ref 39.0–52.0)
Hemoglobin: 11.9 g/dL — ABNORMAL LOW (ref 13.0–17.0)
MCH: 29.8 pg (ref 26.0–34.0)
MCHC: 34.4 g/dL (ref 30.0–36.0)
MCV: 86.7 fL (ref 78.0–100.0)
Platelets: 265 10*3/uL (ref 150–400)
RBC: 3.99 MIL/uL — ABNORMAL LOW (ref 4.22–5.81)
RDW: 13.7 % (ref 11.5–15.5)
WBC: 9.5 10*3/uL (ref 4.0–10.5)

## 2014-12-19 LAB — BASIC METABOLIC PANEL
Anion gap: 10 (ref 5–15)
BUN: <5 mg/dL — ABNORMAL LOW (ref 6–20)
CO2: 24 mmol/L (ref 22–32)
Calcium: 8.2 mg/dL — ABNORMAL LOW (ref 8.9–10.3)
Chloride: 106 mmol/L (ref 101–111)
Creatinine, Ser: 0.9 mg/dL (ref 0.61–1.24)
GFR calc Af Amer: >60 mL/min (ref >60)
GFR calc non Af Amer: >60 mL/min (ref >60)
Glucose, Bld: 120 mg/dL — ABNORMAL HIGH (ref 65–99)
Potassium: 3.4 mmol/L — ABNORMAL LOW (ref 3.5–5.1)
Sodium: 140 mmol/L (ref 135–145)

## 2014-12-19 LAB — CREATININE, SERUM
Creatinine, Ser: 0.8 mg/dL (ref 0.61–1.24)
GFR calc Af Amer: >60 mL/min (ref >60)
GFR calc non Af Amer: >60 mL/min (ref >60)

## 2014-12-19 LAB — MRSA PCR SCREENING: MRSA by PCR: NEGATIVE

## 2014-12-19 MED ORDER — OXYCODONE HCL 5 MG/5ML PO SOLN: 5.0000 mg | Freq: Once | ORAL | Status: DC | PRN

## 2014-12-19 MED ORDER — METOCLOPRAMIDE HCL 5 MG/ML IJ SOLN: 5.0000 mg | Freq: Three times a day (TID) | INTRAMUSCULAR | Status: DC | PRN

## 2014-12-19 MED ORDER — IODIXANOL 320 MG/ML IV SOLN
INTRAVENOUS | Status: DC | PRN
  Administered 2014-12-19: 25 mL via INTRA_ARTERIAL
  Administered 2014-12-19: 228 mL via INTRA_ARTERIAL

## 2014-12-19 MED ORDER — PANTOPRAZOLE SODIUM 40 MG PO TBEC
40.0000 mg | DELAYED_RELEASE_TABLET | Freq: Every day | ORAL | Status: DC
  Administered 2014-12-19 – 2014-12-27 (×8): 40 mg via ORAL
  Filled 2014-12-19 (×9): qty 1

## 2014-12-19 MED ORDER — ALBUTEROL SULFATE (2.5 MG/3ML) 0.083% IN NEBU: 3.0000 mL | INHALATION_SOLUTION | Freq: Four times a day (QID) | RESPIRATORY_TRACT | Status: DC | PRN

## 2014-12-19 MED ORDER — OXYCODONE HCL 5 MG PO TABS: 5.0000 mg | ORAL_TABLET | Freq: Once | ORAL | Status: DC | PRN

## 2014-12-19 MED ORDER — OXYCODONE HCL 5 MG PO TABS
5.0000 mg | ORAL_TABLET | ORAL | Status: DC | PRN
  Administered 2014-12-19 – 2014-12-27 (×33): 10 mg via ORAL
  Filled 2014-12-19 (×32): qty 2

## 2014-12-19 MED ORDER — MEPERIDINE HCL 25 MG/ML IJ SOLN: 6.2500 mg | INTRAMUSCULAR | Status: DC | PRN

## 2014-12-19 MED ORDER — HYDROMORPHONE HCL 1 MG/ML IJ SOLN: 0.2500 mg | INTRAMUSCULAR | Status: DC | PRN

## 2014-12-19 MED ORDER — DOCUSATE SODIUM 100 MG PO CAPS: 100.0000 mg | ORAL_CAPSULE | Freq: Two times a day (BID) | ORAL | Status: DC

## 2014-12-19 MED ORDER — ALUM & MAG HYDROXIDE-SIMETH 200-200-20 MG/5ML PO SUSP
15.0000 mL | ORAL | Status: DC | PRN
  Administered 2014-12-27: 30 mL via ORAL
  Filled 2014-12-19: qty 30

## 2014-12-19 MED ORDER — ONDANSETRON HCL 4 MG/2ML IJ SOLN: 4.0000 mg | Freq: Four times a day (QID) | INTRAMUSCULAR | Status: DC | PRN

## 2014-12-19 MED ORDER — ONDANSETRON HCL 4 MG PO TABS: 4.0000 mg | ORAL_TABLET | Freq: Four times a day (QID) | ORAL | Status: DC | PRN

## 2014-12-19 MED ORDER — MAGNESIUM SULFATE 2 GM/50ML IV SOLN
2.0000 g | Freq: Every day | INTRAVENOUS | Status: DC | PRN
  Filled 2014-12-19: qty 50

## 2014-12-19 MED ORDER — DOCUSATE SODIUM 100 MG PO CAPS
100.0000 mg | ORAL_CAPSULE | Freq: Every day | ORAL | Status: DC
  Administered 2014-12-20 – 2014-12-27 (×7): 100 mg via ORAL
  Filled 2014-12-19 (×7): qty 1

## 2014-12-19 MED ORDER — SODIUM CHLORIDE 0.9 % IV SOLN: 500.0000 mL | Freq: Once | INTRAVENOUS | Status: DC | PRN

## 2014-12-19 MED ORDER — PHENOL 1.4 % MT LIQD: 1.0000 | OROMUCOSAL | Status: DC | PRN

## 2014-12-19 MED ORDER — ALBUMIN HUMAN 5 % IV SOLN
INTRAVENOUS | Status: DC | PRN
  Administered 2014-12-19 (×2): via INTRAVENOUS

## 2014-12-19 MED ORDER — ACETAMINOPHEN 325 MG PO TABS: 325.0000 mg | ORAL_TABLET | ORAL | Status: DC | PRN

## 2014-12-19 MED ORDER — LISINOPRIL 40 MG PO TABS
40.0000 mg | ORAL_TABLET | Freq: Every day | ORAL | Status: DC
  Administered 2014-12-19 – 2014-12-27 (×8): 40 mg via ORAL
  Filled 2014-12-19 (×8): qty 1

## 2014-12-19 MED ORDER — POTASSIUM CHLORIDE IN NACL 20-0.45 MEQ/L-% IV SOLN
INTRAVENOUS | Status: DC
  Administered 2014-12-19: 100 mL/h via INTRAVENOUS
  Administered 2014-12-19 – 2014-12-20 (×2): via INTRAVENOUS
  Filled 2014-12-19 (×6): qty 1000

## 2014-12-19 MED ORDER — HEPARIN SODIUM (PORCINE) 1000 UNIT/ML IJ SOLN
INTRAMUSCULAR | Status: DC | PRN
  Administered 2014-12-19: 8000 [IU] via INTRAVENOUS

## 2014-12-19 MED ORDER — HEPARIN SODIUM (PORCINE) 5000 UNIT/ML IJ SOLN
INTRAMUSCULAR | Status: DC | PRN
  Administered 2014-12-19: 500 mL

## 2014-12-19 MED ORDER — ENOXAPARIN SODIUM 40 MG/0.4ML ~~LOC~~ SOLN
40.0000 mg | SUBCUTANEOUS | Status: DC
  Administered 2014-12-20 – 2014-12-27 (×7): 40 mg via SUBCUTANEOUS
  Filled 2014-12-19 (×7): qty 0.4

## 2014-12-19 MED ORDER — METOCLOPRAMIDE HCL 10 MG PO TABS: 5.0000 mg | ORAL_TABLET | Freq: Three times a day (TID) | ORAL | Status: DC | PRN

## 2014-12-19 MED ORDER — CEFAZOLIN SODIUM-DEXTROSE 2-3 GM-% IV SOLR
INTRAVENOUS | Status: AC
  Filled 2014-12-19: qty 50

## 2014-12-19 MED ORDER — ACETAMINOPHEN 650 MG RE SUPP: 325.0000 mg | RECTAL | Status: DC | PRN

## 2014-12-19 MED ORDER — HEPARIN SODIUM (PORCINE) 1000 UNIT/ML IJ SOLN
INTRAMUSCULAR | Status: AC
  Filled 2014-12-19: qty 1

## 2014-12-19 MED ORDER — ACETAMINOPHEN 650 MG RE SUPP: 650.0000 mg | Freq: Four times a day (QID) | RECTAL | Status: DC | PRN

## 2014-12-19 MED ORDER — GUAIFENESIN-DM 100-10 MG/5ML PO SYRP: 15.0000 mL | ORAL_SOLUTION | ORAL | Status: DC | PRN

## 2014-12-19 MED ORDER — MORPHINE SULFATE 2 MG/ML IJ SOLN: 2.0000 mg | INTRAMUSCULAR | Status: DC | PRN

## 2014-12-19 MED ORDER — HYDRALAZINE HCL 20 MG/ML IJ SOLN
5.0000 mg | INTRAMUSCULAR | Status: DC | PRN
  Administered 2014-12-19: 5 mg via INTRAVENOUS
  Filled 2014-12-19: qty 1

## 2014-12-19 MED ORDER — LABETALOL HCL 5 MG/ML IV SOLN
10.0000 mg | INTRAVENOUS | Status: DC | PRN
  Administered 2014-12-19 – 2014-12-20 (×2): 10 mg via INTRAVENOUS
  Filled 2014-12-19 (×3): qty 4

## 2014-12-19 MED ORDER — OMEPRAZOLE MAGNESIUM 20 MG PO TBEC: 20.0000 mg | DELAYED_RELEASE_TABLET | Freq: Two times a day (BID) | ORAL | Status: DC

## 2014-12-19 MED ORDER — CEFAZOLIN SODIUM 1-5 GM-% IV SOLN
1.0000 g | Freq: Four times a day (QID) | INTRAVENOUS | Status: AC
  Administered 2014-12-19 (×3): 1 g via INTRAVENOUS
  Filled 2014-12-19 (×3): qty 50

## 2014-12-19 MED ORDER — ACETAMINOPHEN 325 MG PO TABS: 650.0000 mg | ORAL_TABLET | Freq: Four times a day (QID) | ORAL | Status: DC | PRN

## 2014-12-19 MED ORDER — ROCURONIUM BROMIDE 50 MG/5ML IV SOLN
INTRAVENOUS | Status: AC
  Filled 2014-12-19: qty 1

## 2014-12-19 MED ORDER — HYDROMORPHONE HCL 1 MG/ML IJ SOLN
1.0000 mg | INTRAMUSCULAR | Status: DC | PRN
  Administered 2014-12-19 – 2014-12-27 (×44): 1 mg via INTRAVENOUS
  Filled 2014-12-19 (×45): qty 1

## 2014-12-19 MED ORDER — POTASSIUM CHLORIDE CRYS ER 20 MEQ PO TBCR
20.0000 meq | EXTENDED_RELEASE_TABLET | Freq: Every day | ORAL | Status: AC | PRN
Start: 2014-12-19 — End: 2014-12-20
  Administered 2014-12-20: 40 meq via ORAL
  Filled 2014-12-19: qty 2

## 2014-12-19 MED ORDER — METOPROLOL TARTRATE 1 MG/ML IV SOLN
2.0000 mg | INTRAVENOUS | Status: DC | PRN
  Filled 2014-12-19: qty 5
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
## 2014-12-19 NOTE — Transfer of Care (Signed)
Immediate Anesthesia Transfer of Care Note  Patient: Vincent Clarke  Procedure(s) Performed: Procedure(s): EXTERNAL FIXATION LEG (Left)  Patient Location: PACU  Anesthesia Type:General  Level of Consciousness: sedated, patient cooperative and responds to stimulation  Airway & Oxygen Therapy: Patient Spontanous Breathing and Patient connected to nasal cannula oxygen  Post-op Assessment: Report given to RN, Post -op Vital signs reviewed and stable and Patient moving all extremities X 4  Post vital signs: Reviewed and stable  Last Vitals:  Filed Vitals:   12/18/14 1945  BP: 191/104  Pulse: 89  Temp:   Resp: 12    Complications: No apparent anesthesia complications

## 2014-12-19 NOTE — Anesthesia Postprocedure Evaluation (Signed)
  Anesthesia Post-op Note  Patient: Vincent Clarke  Procedure(s) Performed: Procedure(s): EXTERNAL FIXATION LEG (Left)  Patient Location: PACU  Anesthesia Type: General   Level of Consciousness: awake, alert  and oriented  Airway and Oxygen Therapy: Patient Spontanous Breathing  Post-op Pain: moderate  Post-op Assessment: Post-op Vital signs reviewed  Post-op Vital Signs: Reviewed  Last Vitals:  Filed Vitals:   12/19/14 1300  BP: 179/99  Pulse: 83  Temp:   Resp: 24    Complications: No apparent anesthesia complications
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
## 2014-12-19 NOTE — Progress Notes (Signed)
Utilization review complete. Asheton Scheffler RN CCM Case Mgmt phone [phone] 
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
## 2014-12-19 NOTE — Progress Notes (Signed)
Post op  Pt awake and extubated still sleepy 2+ graft pulse at ankle, toes pink brisk cap refill warm  Stable post op  Fabienne Bruns, MD [facility] [place] Office: [phone] Pager: [phone]
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
## 2014-12-20 ENCOUNTER — Encounter (HOSPITAL_COMMUNITY): Payer: Self-pay

## 2014-12-20 LAB — BASIC METABOLIC PANEL
Anion gap: 8 (ref 5–15)
BUN: <5 mg/dL — ABNORMAL LOW (ref 6–20)
CO2: 24 mmol/L (ref 22–32)
Calcium: 8 mg/dL — ABNORMAL LOW (ref 8.9–10.3)
Chloride: 102 mmol/L (ref 101–111)
Creatinine, Ser: 0.67 mg/dL (ref 0.61–1.24)
GFR calc Af Amer: >60 mL/min (ref >60)
GFR calc non Af Amer: >60 mL/min (ref >60)
Glucose, Bld: 99 mg/dL (ref 65–99)
Potassium: 3.3 mmol/L — ABNORMAL LOW (ref 3.5–5.1)
Sodium: 134 mmol/L — ABNORMAL LOW (ref 135–145)

## 2014-12-20 LAB — CBC
HCT: 33.2 % — ABNORMAL LOW (ref 39.0–52.0)
Hemoglobin: 11.2 g/dL — ABNORMAL LOW (ref 13.0–17.0)
MCH: 29.6 pg (ref 26.0–34.0)
MCHC: 33.7 g/dL (ref 30.0–36.0)
MCV: 87.6 fL (ref 78.0–100.0)
Platelets: 262 10*3/uL (ref 150–400)
RBC: 3.79 MIL/uL — ABNORMAL LOW (ref 4.22–5.81)
RDW: 13.9 % (ref 11.5–15.5)
WBC: 11.3 10*3/uL — ABNORMAL HIGH (ref 4.0–10.5)

## 2014-12-20 MED ORDER — PNEUMOCOCCAL VAC POLYVALENT 25 MCG/0.5ML IJ INJ
0.5000 mL | INJECTION | Freq: Once | INTRAMUSCULAR | Status: AC
  Administered 2014-12-20: 0.5 mL via INTRAMUSCULAR
  Filled 2014-12-20: qty 0.5

## 2014-12-20 MED ORDER — METHOCARBAMOL 500 MG PO TABS
500.0000 mg | ORAL_TABLET | Freq: Four times a day (QID) | ORAL | Status: DC | PRN
  Administered 2014-12-20 – 2014-12-27 (×11): 500 mg via ORAL
  Filled 2014-12-20 (×11): qty 1

## 2014-12-20 MED ORDER — SODIUM CHLORIDE 0.9 % IJ SOLN: 3.0000 mL | INTRAMUSCULAR | Status: DC | PRN
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
## 2014-12-20 NOTE — Progress Notes (Signed)
Received patient at shift change from [facility]. Notified central telemetry that the patient was laced on telemetry box#14. Will continue to monitor for safety.
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
## 2014-12-21 NOTE — Progress Notes (Signed)
  [facility] Progress Note  S/p #1 abdominal aortogram with left lower extremity arteriogram (second order cath left external iliac artery)  #2 left below-knee popliteal dorsalis pedis artery bypass using non-reversed ipsilateral greater saphenous vein POD 3  Bypass patent. 2+ DP pulse. Incisions intact.  Will follow intermittently.     Maris Berger, PA-C [facility] Office: [phone] Pager: [phone] 12/21/2014 11:15 AM
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
## 2014-12-22 NOTE — Progress Notes (Signed)
No complaints  New blister on either side of ankle not near graft.  ? Secondary to dressing or edema 2+ DP pulse at ankle, toes pink warm  Patent bypass For ORIF next week  Will recheck early next week  Fabienne Bruns, MD [facility] Office: [phone] Pager: [phone]
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
## 2014-12-23 NOTE — Progress Notes (Signed)
Subjective: 5 Days Post-Op Procedure(s) (LRB): Bypass Graft left popliteal to left Dorsalis-pedis. (Left)  Second ordered lower left legANGIOGRAM EXTREMITY LEFT (Left) Patient reports pain as moderate.  No nausea/vomiting, lightheadedness/dizziness, chest pain/sob.  Objective: Vital signs in last 24 hours: Temp:  [98 F (36.7 C)-98.3 F (36.8 C)] 98 F (36.7 C) (08/13 0511) Pulse Rate:  [76-97] 76 (08/13 0511) Resp:  [16-18] 16 (08/13 0511) BP: (117-135)/(70-83) 117/70 mmHg (08/13 0511) SpO2:  [96 %-98 %] 98 % (08/13 0511)  Intake/Output from previous day: 08/12 0701 - 08/13 0700 In: 840 [P.O.:840] Out: 1875 [Urine:1875] Intake/Output this shift:    No results for input(s): HGB in the last 72 hours. No results for input(s): WBC, RBC, HCT, PLT in the last 72 hours. No results for input(s): NA, K, CL, CO2, BUN, CREATININE, GLUCOSE, CALCIUM in the last 72 hours. No results for input(s): LABPT, INR in the last 72 hours.  Neurologically intact Neurovascular intact Sensation intact distally Intact pulses distally Compartment soft  Dressings were saturated.  All changed by me today Ex-fix in place  Assessment/Plan: 5 Days Post-Op Procedure(s) (LRB): Bypass Graft left popliteal to left Dorsalis-pedis. (Left)  Second ordered lower left legANGIOGRAM EXTREMITY LEFT (Left) Advance diet Up with therapy  NWB LLE Elevate as much as possible lovenox for dvt ppx Plan for ORIF L ankle Tuesday.  NPO after midnight Monday Dry dressing change prn  Otilio Saber 12/23/2014, 8:34 AM

## 2014-12-24 NOTE — Progress Notes (Signed)
Subjective: 6 Days Post-Op Procedure(s) (LRB): Bypass Graft left popliteal to left Dorsalis-pedis. (Left)  Second ordered lower left legANGIOGRAM EXTREMITY LEFT (Left) Patient reports pain as moderate.    Objective: Vital signs in last 24 hours: Temp:  [98 F (36.7 C)-98.5 F (36.9 C)] 98.1 F (36.7 C) (08/14 1610) Pulse Rate:  [79-80] 80 (08/14 0633) Resp:  [17-18] 17 (08/14 0633) BP: (122-141)/(64-84) 141/84 mmHg (08/14 0633) SpO2:  [96 %-100 %] 99 % (08/14 0633)  Intake/Output from previous day: 08/13 0701 - 08/14 0700 In: 360 [P.O.:360] Out: 1100 [Urine:1100] Intake/Output this shift:    No results for input(s): HGB in the last 72 hours. No results for input(s): WBC, RBC, HCT, PLT in the last 72 hours. No results for input(s): NA, K, CL, CO2, BUN, CREATININE, GLUCOSE, CALCIUM in the last 72 hours. No results for input(s): LABPT, INR in the last 72 hours.  Neurologically intact Neurovascular intact Sensation intact distally Intact pulses distally Compartment soft  Ex-fix in place Dressings saturated.  Changed by me today  Assessment/Plan: 6 Days Post-Op Procedure(s) (LRB): Bypass Graft left popliteal to left Dorsalis-pedis. (Left)  Second ordered lower left legANGIOGRAM EXTREMITY LEFT (Left) Advance diet Up with therapy  NWB LLE Continue lovenox for dvt ppx Elevate as much as possible Plan for orif left ankle Tuesday.  NPO after midnight monday  Otilio Saber 12/24/2014, 8:31 AM

## 2014-12-25 MED ORDER — DEXTROSE 5 % IV SOLN: 2.0000 g | INTRAVENOUS | Status: DC

## 2014-12-25 MED ORDER — DEXTROSE-NACL 5-0.45 % IV SOLN: 100.0000 mL/h | INTRAVENOUS | Status: DC

## 2014-12-25 MED ORDER — ACETAMINOPHEN 500 MG PO TABS: 1000.0000 mg | ORAL_TABLET | ORAL | Status: DC

## 2014-12-25 MED ORDER — CEFAZOLIN SODIUM-DEXTROSE 2-3 GM-% IV SOLR
2.0000 g | INTRAVENOUS | Status: AC
  Administered 2014-12-26: 2 g via INTRAVENOUS
  Filled 2014-12-25 (×2): qty 50
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
## 2014-12-25 NOTE — Progress Notes (Signed)
PT Cancellation Note  Patient Details Name: MIKEY MAFFETT MRN: [id number] DOB: 03-Feb-1966   Cancelled Treatment:    Reason Eval/Treat Not Completed: Fatigue/lethargy limiting ability to participate. Patient apologetic but stated, " I just don't feel like it today". Attempted to encourage patient just to sit up in the recliner to eat lunch but patient declined. Patient is scheduled for OR tomorrow.    Fredrich Birks 12/25/2014, 11:18 AM  12/25/2014 Fredrich Birks PTA [phone] pager [phone] office

## 2014-12-25 NOTE — Progress Notes (Signed)
[facility]  Subjective  - Ortho surgery tomorrow and possibly home by Thursday.   Objective 126/82 69 98.3 F (36.8 C) (Oral) 17 96%  Intake/Output Summary (Last 24 hours) at 12/25/14 0753 Last data filed at 12/25/14 0500  Gross per 24 hour  Intake    720 ml  Output   1700 ml  Net   -980 ml    Left foot warm, active range of motion of toes intact Incisions healing well   Assessment/Planning: #1 abdominal aortogram with left lower extremity arteriogram (second order cath left external iliac artery)  #2 left below-knee popliteal dorsalis pedis artery bypass using non-reversed ipsilateral greater saphenous vein  ORIF planned for tomorrow by Dr. Alena Bills, Danielle Mink [facility] 12/25/2014 7:53 AM --  Laboratory Lab Results: No results for input(s): WBC, HGB, HCT, PLT in the last 72 hours. BMET No results for input(s): NA, K, CL, CO2, GLUCOSE, BUN, CREATININE, CALCIUM in the last 72 hours.  COAG No results found for: INR, PROTIME No results found for: PTT

## 2014-12-26 ENCOUNTER — Encounter (HOSPITAL_COMMUNITY)
Admission: EM | Disposition: A | Discharge status: Home / Self Care | Payer: Self-pay | Source: Home / Self Care | Attending: Orthopedic Surgery

## 2014-12-26 ENCOUNTER — Inpatient Hospital Stay (HOSPITAL_COMMUNITY): Payer: No Typology Code available for payment source | Admitting: Certified Registered Nurse Anesthetist

## 2014-12-26 ENCOUNTER — Telehealth: Payer: Self-pay | Admitting: Vascular Surgery

## 2014-12-26 ENCOUNTER — Inpatient Hospital Stay (HOSPITAL_COMMUNITY): Payer: No Typology Code available for payment source

## 2014-12-26 ENCOUNTER — Encounter (HOSPITAL_COMMUNITY): Payer: Self-pay | Admitting: Certified Registered Nurse Anesthetist

## 2014-12-26 HISTORY — PX: TIBIA IM NAIL INSERTION: SHX2516

## 2014-12-26 HISTORY — PX: EXTERNAL FIXATION REMOVAL: SHX5040

## 2014-12-26 HISTORY — PX: ORIF FIBULA FRACTURE: SHX5114

## 2014-12-26 LAB — CREATININE, SERUM
Creatinine, Ser: 0.78 mg/dL (ref 0.61–1.24)
GFR calc Af Amer: >60 mL/min (ref >60)
GFR calc non Af Amer: >60 mL/min (ref >60)

## 2014-12-26 SURGERY — INSERTION, INTRAMEDULLARY ROD, TIBIA
Anesthesia: General | Site: Leg Lower | Laterality: Left

## 2014-12-26 MED ORDER — FENTANYL CITRATE (PF) 100 MCG/2ML IJ SOLN
INTRAMUSCULAR | Status: DC | PRN
  Administered 2014-12-26 (×3): 100 ug via INTRAVENOUS
  Administered 2014-12-26: 50 ug via INTRAVENOUS

## 2014-12-26 MED ORDER — LACTATED RINGERS IV SOLN
INTRAVENOUS | Status: DC
  Administered 2014-12-26: 13:00:00 via INTRAVENOUS

## 2014-12-26 MED ORDER — CEFAZOLIN SODIUM-DEXTROSE 2-3 GM-% IV SOLR
2.0000 g | Freq: Four times a day (QID) | INTRAVENOUS | Status: DC
  Administered 2014-12-26 – 2014-12-27 (×3): 2 g via INTRAVENOUS
  Filled 2014-12-26 (×7): qty 50

## 2014-12-26 MED ORDER — ONDANSETRON HCL 4 MG PO TABS: 4.0000 mg | ORAL_TABLET | Freq: Three times a day (TID) | ORAL | Status: DC | PRN

## 2014-12-26 MED ORDER — PHENYLEPHRINE HCL 10 MG/ML IJ SOLN
INTRAMUSCULAR | Status: DC | PRN
  Administered 2014-12-26 (×4): 80 ug via INTRAVENOUS

## 2014-12-26 MED ORDER — HYDROMORPHONE HCL 1 MG/ML IJ SOLN
0.2500 mg | INTRAMUSCULAR | Status: DC | PRN
  Administered 2014-12-26 (×3): 0.5 mg via INTRAVENOUS

## 2014-12-26 MED ORDER — ONDANSETRON HCL 4 MG/2ML IJ SOLN
INTRAMUSCULAR | Status: DC | PRN
  Administered 2014-12-26: 4 mg via INTRAVENOUS

## 2014-12-26 MED ORDER — SUCCINYLCHOLINE CHLORIDE 20 MG/ML IJ SOLN
INTRAMUSCULAR | Status: DC | PRN
  Administered 2014-12-26: 100 mg via INTRAVENOUS

## 2014-12-26 MED ORDER — LACTATED RINGERS IV SOLN
INTRAVENOUS | Status: DC
  Administered 2014-12-26 (×2): via INTRAVENOUS

## 2014-12-26 MED ORDER — DOCUSATE SODIUM 100 MG PO CAPS: 100.0000 mg | ORAL_CAPSULE | Freq: Two times a day (BID) | ORAL | Status: DC

## 2014-12-26 MED ORDER — OXYCODONE-ACETAMINOPHEN 5-325 MG PO TABS: 1.0000 | ORAL_TABLET | ORAL | Status: DC | PRN

## 2014-12-26 MED ORDER — MIDAZOLAM HCL 5 MG/5ML IJ SOLN
INTRAMUSCULAR | Status: DC | PRN
  Administered 2014-12-26: 2 mg via INTRAVENOUS

## 2014-12-26 MED ORDER — METOCLOPRAMIDE HCL 5 MG/ML IJ SOLN: 5.0000 mg | Freq: Three times a day (TID) | INTRAMUSCULAR | Status: DC | PRN

## 2014-12-26 MED ORDER — PROMETHAZINE HCL 25 MG/ML IJ SOLN: 6.2500 mg | INTRAMUSCULAR | Status: DC | PRN

## 2014-12-26 MED ORDER — PROPOFOL 10 MG/ML IV BOLUS
INTRAVENOUS | Status: DC | PRN
  Administered 2014-12-26: 160 mg via INTRAVENOUS

## 2014-12-26 MED ORDER — ENOXAPARIN SODIUM 40 MG/0.4ML ~~LOC~~ SOLN: 40.0000 mg | SUBCUTANEOUS | Status: DC

## 2014-12-26 MED ORDER — LIDOCAINE HCL (CARDIAC) 20 MG/ML IV SOLN
INTRAVENOUS | Status: DC | PRN
  Administered 2014-12-26: 50 mg via INTRAVENOUS

## 2014-12-26 MED ORDER — 0.9 % SODIUM CHLORIDE (POUR BTL) OPTIME
TOPICAL | Status: DC | PRN
  Administered 2014-12-26: 1000 mL

## 2014-12-26 MED ORDER — POTASSIUM CHLORIDE IN NACL 20-0.45 MEQ/L-% IV SOLN
INTRAVENOUS | Status: DC
  Administered 2014-12-26: 19:00:00 via INTRAVENOUS
  Filled 2014-12-26 (×5): qty 1000

## 2014-12-26 MED ORDER — METOCLOPRAMIDE HCL 5 MG PO TABS
5.0000 mg | ORAL_TABLET | Freq: Three times a day (TID) | ORAL | Status: DC | PRN
Start: 2014-12-26 — End: 2014-12-27

## 2014-12-26 SURGICAL SUPPLY — 79 items
BANDAGE ELASTIC 4 VELCRO ST LF (GAUZE/BANDAGES/DRESSINGS) ×2 IMPLANT
BANDAGE ELASTIC 6 VELCRO ST LF (GAUZE/BANDAGES/DRESSINGS) ×2 IMPLANT
BIT DRILL AO GAMMA 4.2X130 (BIT) ×2 IMPLANT
BIT DRILL AO GAMMA 4.2X340 (BIT) ×2 IMPLANT
BLADE SURG 10 STRL SS (BLADE) ×2 IMPLANT
BNDG COHESIVE 4X5 TAN STRL (GAUZE/BANDAGES/DRESSINGS) ×2 IMPLANT
BNDG GAUZE ELAST 4 BULKY (GAUZE/BANDAGES/DRESSINGS) ×4 IMPLANT
CHLORAPREP W/TINT 26ML (MISCELLANEOUS) ×2 IMPLANT
COVER MAYO STAND STRL (DRAPES) ×2 IMPLANT
COVER SURGICAL LIGHT HANDLE (MISCELLANEOUS) ×4 IMPLANT
CUFF TOURNIQUET SINGLE 34IN LL (TOURNIQUET CUFF) IMPLANT
CUFF TOURNIQUET SINGLE 44IN (TOURNIQUET CUFF) IMPLANT
DRAPE C-ARM 42X72 X-RAY (DRAPES) ×2 IMPLANT
DRAPE C-ARMOR (DRAPES) IMPLANT
DRAPE IMP U-DRAPE 54X76 (DRAPES) ×2 IMPLANT
DRAPE PROXIMA HALF (DRAPES) ×2 IMPLANT
DRESSING OPSITE X SMALL 2X3 (GAUZE/BANDAGES/DRESSINGS) ×2 IMPLANT
DRILL 2.6X122MM WL AO SHAFT (BIT) ×2 IMPLANT
DRSG ADAPTIC 3X8 NADH LF (GAUZE/BANDAGES/DRESSINGS) ×4 IMPLANT
DRSG PAD ABDOMINAL 8X10 ST (GAUZE/BANDAGES/DRESSINGS) ×6 IMPLANT
DRSG TEGADERM 4X4.75 (GAUZE/BANDAGES/DRESSINGS) ×4 IMPLANT
ELECT CAUTERY BLADE 6.4 (BLADE) ×2 IMPLANT
ELECT REM PT RETURN 9FT ADLT (ELECTROSURGICAL) ×2
ELECTRODE REM PT RTRN 9FT ADLT (ELECTROSURGICAL) ×1 IMPLANT
GAUZE SPONGE 4X4 12PLY STRL (GAUZE/BANDAGES/DRESSINGS) ×2 IMPLANT
GLOVE BIO SURGEON STRL SZ7 (GLOVE) ×4 IMPLANT
GLOVE BIO SURGEON STRL SZ7.5 (GLOVE) ×4 IMPLANT
GLOVE BIO SURGEON STRL SZ8.5 (GLOVE) ×2 IMPLANT
GLOVE BIOGEL PI IND STRL 7.0 (GLOVE) ×1 IMPLANT
GLOVE BIOGEL PI IND STRL 8 (GLOVE) ×2 IMPLANT
GLOVE BIOGEL PI INDICATOR 7.0 (GLOVE) ×1
GLOVE BIOGEL PI INDICATOR 8 (GLOVE) ×2
GLOVE SURG SS PI 6.0 STRL IVOR (GLOVE) ×2 IMPLANT
GLOVE SURG SS PI 7.0 STRL IVOR (GLOVE) ×4 IMPLANT
GOWN STRL REUS W/ TWL LRG LVL3 (GOWN DISPOSABLE) ×2 IMPLANT
GOWN STRL REUS W/TWL LRG LVL3 (GOWN DISPOSABLE) ×2
GUIDEWIRE GAMMA (WIRE) ×2 IMPLANT
GUIDEWIRE GAMMA 800 (WIRE) ×2 IMPLANT
KIT BASIN OR (CUSTOM PROCEDURE TRAY) ×2 IMPLANT
KIT ROOM TURNOVER OR (KITS) ×2 IMPLANT
MANIFOLD NEPTUNE II (INSTRUMENTS) ×2 IMPLANT
NAIL TIBIA STD 9X330MM (Nail) ×2 IMPLANT
NS IRRIG 1000ML POUR BTL (IV SOLUTION) ×2 IMPLANT
PACK ORTHO EXTREMITY (CUSTOM PROCEDURE TRAY) ×2 IMPLANT
PACK UNIVERSAL I (CUSTOM PROCEDURE TRAY) ×2 IMPLANT
PAD ARMBOARD 7.5X6 YLW CONV (MISCELLANEOUS) ×4 IMPLANT
PAD CAST 4YDX4 CTTN HI CHSV (CAST SUPPLIES) ×1 IMPLANT
PADDING CAST COTTON 4X4 STRL (CAST SUPPLIES) ×1
PLATE 10H 132MM (Plate) ×2 IMPLANT
PREP POVIDONE IODINE SPRAY 2OZ (MISCELLANEOUS) ×4 IMPLANT
REAMER SHAFT BIXCUT (INSTRUMENTS) ×2 IMPLANT
SCREW BONE 14MMX3.5MM (Screw) ×2 IMPLANT
SCREW BONE 18 (Screw) ×2 IMPLANT
SCREW BONE NON-LCKING 3.5X12MM (Screw) ×8 IMPLANT
SCREW GAMMA (Screw) ×2 IMPLANT
SCREW LOCK 3.5X14 (Screw) ×2 IMPLANT
SCREW LOCKING 3.5X16MM (Screw) ×2 IMPLANT
SCREW LOCKING T2 F/T  5MMX35MM (Screw) ×1 IMPLANT
SCREW LOCKING T2 F/T  5MMX40MM (Screw) ×1 IMPLANT
SCREW LOCKING T2 F/T 5MMX35MM (Screw) ×1 IMPLANT
SCREW LOCKING T2 F/T 5MMX40MM (Screw) ×1 IMPLANT
SCREW LOCKING THREADED 5X47.5 (Screw) ×2 IMPLANT
SCRUB POVIDONE IODINE 4 OZ (MISCELLANEOUS) ×2 IMPLANT
SOL PREP POV-IOD 4OZ 10% (MISCELLANEOUS) ×4 IMPLANT
SPONGE GAUZE 4X4 12PLY STER LF (GAUZE/BANDAGES/DRESSINGS) ×4 IMPLANT
SPONGE LAP 18X18 X RAY DECT (DISPOSABLE) ×2 IMPLANT
STAPLER VISISTAT 35W (STAPLE) ×2 IMPLANT
SUT ETHILON 3 0 PS 1 (SUTURE) ×2 IMPLANT
SUT MNCRL AB 4-0 PS2 18 (SUTURE) IMPLANT
SUT MON AB 2-0 CT1 27 (SUTURE) ×2 IMPLANT
SUT VIC AB 0 CT1 27 (SUTURE) ×2
SUT VIC AB 0 CT1 27XBRD ANBCTR (SUTURE) ×2 IMPLANT
SYR BULB IRRIGATION 50ML (SYRINGE) ×2 IMPLANT
TOWEL OR 17X24 6PK STRL BLUE (TOWEL DISPOSABLE) ×4 IMPLANT
TOWEL OR 17X26 10 PK STRL BLUE (TOWEL DISPOSABLE) ×2 IMPLANT
TOWEL OR NON WOVEN STRL DISP B (DISPOSABLE) ×2 IMPLANT
TUBE CONNECTING 12X1/4 (SUCTIONS) ×2 IMPLANT
WATER STERILE IRR 1000ML POUR (IV SOLUTION) ×2 IMPLANT
YANKAUER SUCT BULB TIP NO VENT (SUCTIONS) ×2 IMPLANT

## 2014-12-26 NOTE — Progress Notes (Signed)
Report given to angel britt rn as caregiver 

## 2014-12-26 NOTE — Progress Notes (Signed)
[facility]  Subjective  - Doing OK over all ready to go to the OR today.   Objective 146/79 69 98.3 F (36.8 C) (Oral) 18 97%  Intake/Output Summary (Last 24 hours) at 12/26/14 0818 Last data filed at 12/25/14 1700  Gross per 24 hour  Intake    240 ml  Output   1200 ml  Net   -960 ml    Active range of motion of toes left foot Palpable AT pulse Incisions healing well  Assessment/Planning: #1 abdominal aortogram with left lower extremity arteriogram (second order cath left external iliac artery)  #2 left below-knee popliteal dorsalis pedis artery bypass using non-reversed ipsilateral greater saphenous vein Left foot well perfused  OR today with Dr. Alena Bills, Clovis Warwick [facility] 12/26/2014 8:18 AM --  Laboratory Lab Results: No results for input(s): WBC, HGB, HCT, PLT in the last 72 hours. BMET  Recent Labs  12/26/14 0534  CREATININE 0.78    COAG No results found for: INR, PROTIME No results found for: PTT

## 2014-12-26 NOTE — Transfer of Care (Signed)
Immediate Anesthesia Transfer of Care Note  Patient: Vincent Clarke  Procedure(s) Performed: Procedure(s) with comments: INTRAMEDULLARY (IM) NAIL TIBIAL (Left) - biomet ex fix removal and stryker tibial nail REMOVAL EXTERNAL FIXATION LEG (Left) OPEN REDUCTION INTERNAL FIXATION (ORIF) FIBULA FRACTURE (Left)  Patient Location: PACU  Anesthesia Type:General  Level of Consciousness: awake, alert , oriented, patient cooperative and responds to stimulation  Airway & Oxygen Therapy: Patient Spontanous Breathing and Patient connected to nasal cannula oxygen  Post-op Assessment: Report given to RN, Post -op Vital signs reviewed and stable, Patient moving all extremities X 4 and Patient able to stick tongue midline  Post vital signs: stable  Last Vitals:  Filed Vitals:   12/26/14 0536  BP: 146/79  Pulse: 69  Temp: 36.8 C  Resp: 18    Complications: No apparent anesthesia complications

## 2014-12-26 NOTE — Interval H&P Note (Signed)
History and Physical Interval Note:  12/26/2014 7:21 AM  Vincent Clarke  has presented today for surgery, with the diagnosis of tibial fracture  The various methods of treatment have been discussed with the patient and family. After consideration of risks, benefits and other options for treatment, the patient has consented to  Procedure(s) with comments: INTRAMEDULLARY (IM) NAIL TIBIAL (Left) - biomet ex fix removal and stryker tibial nail as a surgical intervention .  The patient's history has been reviewed, patient examined, no change in status, stable for surgery.  I have reviewed the patient's chart and labs.  Questions were answered to the patient's satisfaction.     Jahnai Slingerland D

## 2014-12-26 NOTE — Anesthesia Preprocedure Evaluation (Signed)
Anesthesia Evaluation  Patient identified by MRN, date of birth, ID band Patient awake    Reviewed: Allergy & Precautions, NPO status , Patient's Chart, lab work & pertinent test results  Airway Mallampati: II  TM Distance: >3 FB Neck ROM: Full    Dental no notable dental hx.    Pulmonary Current Smoker,  breath sounds clear to auscultation  Pulmonary exam normal       Cardiovascular hypertension, Pt. on medications Normal cardiovascular examRhythm:Regular Rate:Normal     Neuro/Psych negative neurological ROS  negative psych ROS   GI/Hepatic Neg liver ROS, GERD-  Medicated,  Endo/Other  negative endocrine ROS  Renal/GU negative Renal ROS  negative genitourinary   Musculoskeletal negative musculoskeletal ROS (+)   Abdominal   Peds negative pediatric ROS (+)  Hematology negative hematology ROS (+)   Anesthesia Other Findings   Reproductive/Obstetrics negative OB ROS                             Anesthesia Physical Anesthesia Plan  ASA: II  Anesthesia Plan: General   Post-op Pain Management:    Induction: Intravenous  Airway Management Planned: Oral ETT  Additional Equipment:   Intra-op Plan:   Post-operative Plan: Extubation in OR  Informed Consent: I have reviewed the patients History and Physical, chart, labs and discussed the procedure including the risks, benefits and alternatives for the proposed anesthesia with the patient or authorized representative who has indicated his/her understanding and acceptance.   Dental advisory given  Plan Discussed with: CRNA and Surgeon  Anesthesia Plan Comments:         Anesthesia Quick Evaluation

## 2014-12-26 NOTE — Telephone Encounter (Signed)
LM for pt to call [phone]- did not leave any other message due to "unknown" status of account. dpm

## 2014-12-26 NOTE — Telephone Encounter (Signed)
-----   Message from Sharee Pimple, RN sent at 12/21/2014  1:52 PM EDT ----- Regarding: Schedule   ----- Message -----    From: Raymond Gurney, PA-C    Sent: 12/21/2014  11:17 AM      To: [facility] Charge Pool  S/p #1 abdominal aortogram with left lower extremity arteriogram (second order cath left external iliac artery)  #2 left below-knee popliteal dorsalis pedis artery bypass using non-reversed ipsilateral greater saphenous vein 12/18/14  F/u with Dr. Darrick Penna in 2 weeks  Thanks Selena Batten
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
## 2014-12-26 NOTE — Op Note (Signed)
12/18/2014 - 12/26/2014  2:30 PM  PATIENT:  Vincent Clarke    PRE-OPERATIVE DIAGNOSIS:  OPEN TIBIA AND FIBULAR FRACTURE   POST-OPERATIVE DIAGNOSIS:  Same  PROCEDURE:  INTRAMEDULLARY (IM) NAIL TIBIAL, REMOVAL EXTERNAL FIXATION LEG, OPEN REDUCTION INTERNAL FIXATION (ORIF) FIBULA FRACTURE  SURGEON:  Ahmaad Neidhardt, Jewel Baize, MD  ASSISTANT: Janalee Dane, PA-C, She was present and scrubbed throughout the case, critical for completion in a timely fashion, and for retraction, instrumentation, and closure.   ANESTHESIA:   General  PREOPERATIVE INDICATIONS:  Vincent Clarke is a  49 y.o. male with a diagnosis of OPEN TIBIA AND FIBULAR FRACTURE  who failed conservative measures and elected for surgical management.    The risks benefits and alternatives were discussed with the patient preoperatively including but not limited to the risks of infection, bleeding, nerve injury, cardiopulmonary complications, the need for revision surgery, among others, and the patient was willing to proceed.  OPERATIVE IMPLANTS: Stryker T2 Tibial nail and a lateral locking plate on the fibula   BLOOD LOSS: minimal  COMPLICATIONS: none  TOURNIQUET TIME: none  OPERATIVE PROCEDURE:  Patient was identified in the preoperative holding area and site was marked by me He was transported to the operating theater and placed on the table in supine position taking care to pad all bony prominences. After a preincinduction time out anesthesia was induced. The left lower extremity was prepped and draped in normal sterile fashion and a pre-incision timeout was performed. Vincent Clarke received ancef for preoperative antibiotics.   I wanted to set lateral column rotation and length so I bridge plated the fibula. I protected the SP nerve and dissected down to the fibula. I placed 3 distal screws, leaving a bridge segment I placed 3 proximal screws as well.   The leg was placed over the radiolucent triangle. I then made a 4 cm  incision over the patella tendon. I dissected down and incised the patella tendon taking care to not penetrate into the joint itself.   I placed a guidewire under fluoroscopic guidance just medial to lateral tibial spine. I was happy with this placement on all views. I used the entry reamer to create a path into the proximal tibia staying out of the joint itself.   I then passed the ball tip guidewire down the tibia and across the fracture site. I held appropriate reduction confirmed on multiple views of fluoroscopy and sequentially reamed up to an appropriate size with appropriate chatter. I selected the above-sized nail and passed it down the ball-tipped guidewire and seated it completely to a was sunk into the proximal tibia.  I then used the outrigger placed a static transverse and 2 oblique proximal locking screws. As happy with her length and placement on multiple oblique fluoroscopic views.  Next I confirmed appropriate rotation of the tibia with fracture tease and orientation of the patella and toes. I then used a perfect circles technique to place 2 distal static interlock screw.  The wounds were then all thoroughly irrigated and closed in layers. Sterile dressing was applied and he was taken to the PACU in stable condition.  POST OPERATIVE PLAN: NWB, DVT prophylaxis: Early ambulation, SCD's, and chemical px.   This note was generated using a template and dragon dictation system. In light of that, I have reviewed the note and all aspects of it are applicable to this case. Any dictation errors are due to the computerized dictation system.

## 2014-12-26 NOTE — Anesthesia Procedure Notes (Signed)
Procedure Name: Intubation Date/Time: 12/26/2014 12:30 PM Performed by: Marylyn Ishihara Pre-anesthesia Checklist: Patient identified, Timeout performed, Emergency Drugs available, Suction available and Patient being monitored Patient Re-evaluated:Patient Re-evaluated prior to inductionOxygen Delivery Method: Circle system utilized Preoxygenation: Pre-oxygenation with 100% oxygen Intubation Type: IV induction Ventilation: Mask ventilation without difficulty Laryngoscope Size: Mac and 3 Grade View: Grade I Tube type: Oral Tube size: 7.0 mm Number of attempts: 1 Airway Equipment and Method: Stylet Placement Confirmation: ETT inserted through vocal cords under direct vision,  breath sounds checked- equal and bilateral and positive ETCO2 Secured at: 21 cm Tube secured with: Tape Dental Injury: Teeth and Oropharynx as per pre-operative assessment

## 2014-12-27 ENCOUNTER — Encounter (HOSPITAL_COMMUNITY): Payer: Self-pay | Admitting: Orthopedic Surgery

## 2014-12-27 DIAGNOSIS — S82202A Unspecified fracture of shaft of left tibia, initial encounter for closed fracture: Secondary | ICD-10-CM

## 2014-12-27 HISTORY — DX: Unspecified fracture of shaft of left tibia, initial encounter for closed fracture: S82.202A

## 2014-12-27 NOTE — Anesthesia Postprocedure Evaluation (Signed)
  Anesthesia Post-op Note  Patient: Vincent Clarke  Procedure(s) Performed: Procedure(s) (LRB): INTRAMEDULLARY (IM) NAIL TIBIAL (Left) REMOVAL EXTERNAL FIXATION LEG (Left) OPEN REDUCTION INTERNAL FIXATION (ORIF) FIBULA FRACTURE (Left)  Patient Location: PACU  Anesthesia Type: General  Level of Consciousness: awake and alert   Airway and Oxygen Therapy: Patient Spontanous Breathing  Post-op Pain: mild  Post-op Assessment: Post-op Vital signs reviewed, Patient's Cardiovascular Status Stable, Respiratory Function Stable, Patent Airway and No signs of Nausea or vomiting  Last Vitals:  Filed Vitals:   12/27/14 1000  BP: 164/82  Pulse:   Temp:   Resp:     Post-op Vital Signs: stable   Complications: No apparent anesthesia complications
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
## 2014-12-27 NOTE — Progress Notes (Signed)
PT Cancellation Note  Patient Details Name: Vincent Clarke MRN: [id number] DOB: Oct 06, 1965   Cancelled Treatment:    Reason Eval/Treat Not Completed: Patient declined, no reason specified Pt declined participating in therapy reporting 9/10 pain and "it is not even 10 am yet." Will follow up at a later time per Patient request if time allows.   [name] 12/27/2014, 10:14 AM Mylo Red, PT, DPT [phone]

## 2014-12-27 NOTE — Progress Notes (Signed)
Orthopedic Tech Progress Note Patient Details:  Vincent Clarke 1966/01/11 [id number]  Ortho Devices Type of Ortho Device: CAM walker Splint Material: Fiberglass Ortho Device/Splint Interventions: Ordered As ordered by Dr. Elvera Lennox, Derenda Giddings 12/27/2014, 8:10 AM

## 2014-12-27 NOTE — Discharge Instructions (Signed)
KEEP wounds clean and dry till follow up.  Ok to change dressings as needed if draining.  No compression dressings.  NON-WEIGHT BEARING AND IN THE CAM BOOT AT ALL TIMES  CONTINUE THE LOVENOX INJECTIONS FOR 30 DAYS FOR DVT PROPHYLAXIS   Ankle Fracture A fracture is a break in a bone. The ankle joint is made up of three bones. These include the lower (distal)sections of your lower leg bones, called the tibia and fibula, along with a bone in your foot, called the talus. Depending on how bad the break is and if more than one ankle joint bone is broken, a cast or splint is used to protect and keep your injured bone from moving while it heals. Sometimes, surgery is required to help the fracture heal properly.  There are two general types of fractures:  Stable fracture. This includes a single fracture line through one bone, with no injury to ankle ligaments. A fracture of the talus that does not have any displacement (movement of the bone on either side of the fracture line) is also stable.  Unstable fracture. This includes more than one fracture line through one or more bones in the ankle joint. It also includes fractures that have displacement of the bone on either side of the fracture line. CAUSES  A direct blow to the ankle.   Quickly and severely twisting your ankle.  Trauma, such as a car accident or falling from a significant height. RISK FACTORS You may be at a higher risk of ankle fracture if:  You have certain medical conditions.  You are involved in high-impact sports.  You are involved in a high-impact car accident. SIGNS AND SYMPTOMS   Tender and swollen ankle.  Bruising around the injured ankle.  Pain on movement of the ankle.  Difficulty walking or putting weight on the ankle.  A cold foot below the site of the ankle injury. This can occur if the blood vessels passing through your injured ankle were also damaged.  Numbness in the foot below the site of the ankle  injury. DIAGNOSIS  An ankle fracture is usually diagnosed with a physical exam and X-rays. A CT scan may also be required for complex fractures. TREATMENT  Stable fractures are treated with a cast or splint and using crutches to avoid putting weight on your injured ankle. This is followed by an ankle strengthening program. Some patients require a special type of cast, depending on other medical problems they may have. Unstable fractures require surgery to ensure the bones heal properly. Your health care provider will tell you what type of fracture you have and the best treatment for your condition. HOME CARE INSTRUCTIONS   Review correct crutch use with your health care provider and use your crutches as directed. Safe use of crutches is extremely important. Misuse of crutches can cause you to fall or cause injury to nerves in your hands or armpits.  Do not put weight or pressure on the injured ankle until directed by your health care provider.  To lessen the swelling, keep the injured leg elevated while sitting or lying down.  Apply ice to the injured area:  Put ice in a plastic bag.  Place a towel between your cast and the bag.  Leave the ice on for 20 minutes, 2-3 times a day.  If you have a plaster or fiberglass cast:  Do not try to scratch the skin under the cast with any objects. This can increase your risk of skin infection.  Check the skin around the cast every day. You may put lotion on any red or sore areas.  Keep your cast dry and clean.  If you have a plaster splint:  Wear the splint as directed.  You may loosen the elastic around the splint if your toes become numb, tingle, or turn cold or blue.  Do not put pressure on any part of your cast or splint; it may break. Rest your cast only on a pillow the first 24 hours until it is fully hardened.  Your cast or splint can be protected during bathing with a plastic bag sealed to your skin with medical tape. Do not lower the  cast or splint into water.  Take medicines as directed by your health care provider. Only take over-the-counter or prescription medicines for pain, discomfort, or fever as directed by your health care provider.  Do not drive a vehicle until your health care provider specifically tells you it is safe to do so.  If your health care provider has given you a follow-up appointment, it is very important to keep that appointment. Not keeping the appointment could result in a chronic or permanent injury, pain, and disability. If you have any problem keeping the appointment, call the facility for assistance. SEEK MEDICAL CARE IF: You develop increased swelling or discomfort. SEEK IMMEDIATE MEDICAL CARE IF:   Your cast gets damaged or breaks.  You have continued severe pain.  You develop new pain or swelling after the cast was put on.  Your skin or toenails below the injury turn blue or gray.  Your skin or toenails below the injury feel cold, numb, or have loss of sensitivity to touch.  There is a bad smell or pus draining from under the cast. MAKE SURE YOU:   Understand these instructions.  Will watch your condition.  Will get help right away if you are not doing well or get worse. Document Released: 04/25/2000 Document Revised: 05/03/2013 Document Reviewed: 11/25/2012 [facility] Patient Information 2015 [place], [place]. This information is not intended to replace advice given to you by your health care provider. Make sure you discuss any questions you have with your health care provider.
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
## 2015-01-01 ENCOUNTER — Telehealth: Payer: Self-pay | Admitting: Vascular Surgery

## 2015-01-01 NOTE — Telephone Encounter (Signed)
Spoke with pt, dpm °
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
## 2015-01-10 ENCOUNTER — Encounter: Payer: Self-pay | Admitting: Vascular Surgery

## 2015-01-11 ENCOUNTER — Ambulatory Visit (INDEPENDENT_AMBULATORY_CARE_PROVIDER_SITE_OTHER): Payer: Self-pay | Admitting: Vascular Surgery

## 2015-01-11 ENCOUNTER — Encounter: Payer: Self-pay | Admitting: Vascular Surgery

## 2015-01-11 VITALS — BP 142/85 | HR 95 | Temp 98.4°F | Ht 65.0 in | Wt 165.3 lb

## 2015-01-11 DIAGNOSIS — S91001D Unspecified open wound, right ankle, subsequent encounter: Secondary | ICD-10-CM

## 2015-01-11 DIAGNOSIS — S91031D Puncture wound without foreign body, right ankle, subsequent encounter: Secondary | ICD-10-CM

## 2015-01-11 DIAGNOSIS — Z716 Tobacco abuse counseling: Secondary | ICD-10-CM

## 2015-01-11 DIAGNOSIS — W3400XD Accidental discharge from unspecified firearms or gun, subsequent encounter: Secondary | ICD-10-CM
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
## 2015-01-23 ENCOUNTER — Encounter: Payer: Self-pay | Admitting: Vascular Surgery

## 2015-01-25 ENCOUNTER — Ambulatory Visit: Payer: Self-pay | Admitting: Vascular Surgery

## 2015-02-13 ENCOUNTER — Encounter: Payer: Self-pay | Admitting: Vascular Surgery

## 2015-02-15 ENCOUNTER — Ambulatory Visit: Payer: Self-pay | Admitting: Vascular Surgery

## 2015-02-27 ENCOUNTER — Encounter: Payer: Self-pay | Admitting: Vascular Surgery

## 2015-02-28 ENCOUNTER — Ambulatory Visit: Payer: Self-pay | Admitting: Vascular Surgery

## 2015-03-27 ENCOUNTER — Encounter: Payer: Self-pay | Admitting: Vascular Surgery

## 2015-03-29 ENCOUNTER — Ambulatory Visit: Admitting: Vascular Surgery

## 2015-06-01 ENCOUNTER — Emergency Department (HOSPITAL_COMMUNITY)
Admission: EM | Admit: 2015-06-01 | Discharge: 2015-06-01 | Discharge status: Against Medical Advice | Attending: Emergency Medicine | Admitting: Emergency Medicine

## 2015-06-01 ENCOUNTER — Encounter (HOSPITAL_COMMUNITY): Payer: Self-pay | Admitting: *Deleted

## 2015-06-01 DIAGNOSIS — F17210 Nicotine dependence, cigarettes, uncomplicated: Secondary | ICD-10-CM | POA: Insufficient documentation

## 2015-06-01 DIAGNOSIS — I10 Essential (primary) hypertension: Secondary | ICD-10-CM | POA: Insufficient documentation

## 2015-06-01 DIAGNOSIS — L089 Local infection of the skin and subcutaneous tissue, unspecified: Secondary | ICD-10-CM | POA: Insufficient documentation

## 2015-06-01 DIAGNOSIS — J45909 Unspecified asthma, uncomplicated: Secondary | ICD-10-CM | POA: Insufficient documentation
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
## 2015-06-03 ENCOUNTER — Encounter (HOSPITAL_COMMUNITY): Payer: Self-pay | Admitting: Emergency Medicine

## 2015-06-03 ENCOUNTER — Emergency Department (HOSPITAL_COMMUNITY)
Admission: EM | Admit: 2015-06-03 | Discharge: 2015-06-03 | Disposition: A | Discharge status: Home / Self Care | Attending: Emergency Medicine | Admitting: Emergency Medicine

## 2015-06-03 ENCOUNTER — Emergency Department (HOSPITAL_COMMUNITY): Payer: Self-pay

## 2015-06-03 ENCOUNTER — Encounter (HOSPITAL_COMMUNITY): Payer: Self-pay | Admitting: *Deleted

## 2015-06-03 ENCOUNTER — Emergency Department (HOSPITAL_COMMUNITY)

## 2015-06-03 DIAGNOSIS — K219 Gastro-esophageal reflux disease without esophagitis: Secondary | ICD-10-CM | POA: Insufficient documentation

## 2015-06-03 DIAGNOSIS — J45909 Unspecified asthma, uncomplicated: Secondary | ICD-10-CM | POA: Insufficient documentation

## 2015-06-03 DIAGNOSIS — M96830 Postprocedural hemorrhage and hematoma of a musculoskeletal structure following a musculoskeletal system procedure: Secondary | ICD-10-CM | POA: Insufficient documentation

## 2015-06-03 DIAGNOSIS — F17210 Nicotine dependence, cigarettes, uncomplicated: Secondary | ICD-10-CM | POA: Insufficient documentation

## 2015-06-03 DIAGNOSIS — Y658 Other specified misadventures during surgical and medical care: Secondary | ICD-10-CM | POA: Insufficient documentation

## 2015-06-03 DIAGNOSIS — T814XXA Infection following a procedure, initial encounter: Secondary | ICD-10-CM | POA: Insufficient documentation

## 2015-06-03 DIAGNOSIS — I10 Essential (primary) hypertension: Secondary | ICD-10-CM | POA: Insufficient documentation

## 2015-06-03 DIAGNOSIS — Z79899 Other long term (current) drug therapy: Secondary | ICD-10-CM | POA: Insufficient documentation

## 2015-06-03 DIAGNOSIS — T847XXA Infection and inflammatory reaction due to other internal orthopedic prosthetic devices, implants and grafts, initial encounter: Secondary | ICD-10-CM

## 2015-06-03 DIAGNOSIS — M869 Osteomyelitis, unspecified: Secondary | ICD-10-CM

## 2015-06-03 LAB — CBC WITH DIFFERENTIAL/PLATELET
Basophils Absolute: 0.1 10*3/uL (ref 0.0–0.1)
Basophils Relative: 1 %
Eosinophils Absolute: 0.8 10*3/uL — ABNORMAL HIGH (ref 0.0–0.7)
Eosinophils Relative: 9 %
HCT: 38.7 % — ABNORMAL LOW (ref 39.0–52.0)
Hemoglobin: 12.9 g/dL — ABNORMAL LOW (ref 13.0–17.0)
Lymphocytes Relative: 32 %
Lymphs Abs: 3 10*3/uL (ref 0.7–4.0)
MCH: 27.8 pg (ref 26.0–34.0)
MCHC: 33.3 g/dL (ref 30.0–36.0)
MCV: 83.4 fL (ref 78.0–100.0)
Monocytes Absolute: 0.7 10*3/uL (ref 0.1–1.0)
Monocytes Relative: 8 %
Neutro Abs: 4.7 10*3/uL (ref 1.7–7.7)
Neutrophils Relative %: 50 %
Platelets: 449 10*3/uL — ABNORMAL HIGH (ref 150–400)
RBC: 4.64 MIL/uL (ref 4.22–5.81)
RDW: 15.1 % (ref 11.5–15.5)
WBC: 9.3 10*3/uL (ref 4.0–10.5)

## 2015-06-03 LAB — BASIC METABOLIC PANEL
Anion gap: 11 (ref 5–15)
BUN: <5 mg/dL — ABNORMAL LOW (ref 6–20)
CO2: 24 mmol/L (ref 22–32)
Calcium: 9.1 mg/dL (ref 8.9–10.3)
Chloride: 105 mmol/L (ref 101–111)
Creatinine, Ser: 0.63 mg/dL (ref 0.61–1.24)
GFR calc Af Amer: >60 mL/min (ref >60)
GFR calc non Af Amer: >60 mL/min (ref >60)
Glucose, Bld: 96 mg/dL (ref 65–99)
Potassium: 3.7 mmol/L (ref 3.5–5.1)
Sodium: 140 mmol/L (ref 135–145)

## 2015-06-03 LAB — SEDIMENTATION RATE: Sed Rate: 55 mm/hr — ABNORMAL HIGH (ref 0–16)

## 2015-06-03 LAB — C-REACTIVE PROTEIN: CRP: 2 mg/dL — ABNORMAL HIGH (ref <1.0)

## 2015-06-03 MED ORDER — LISINOPRIL 20 MG PO TABS
40.0000 mg | ORAL_TABLET | Freq: Once | ORAL | Status: AC
  Administered 2015-06-03: 40 mg via ORAL
  Filled 2015-06-03: qty 2

## 2015-06-03 MED ORDER — OXYCODONE HCL 5 MG PO TABS
5.0000 mg | ORAL_TABLET | Freq: Once | ORAL | Status: AC
Start: 2015-06-03 — End: 2015-06-03
  Administered 2015-06-03: 5 mg via ORAL
  Filled 2015-06-03: qty 1

## 2015-06-03 MED ORDER — ONDANSETRON HCL 4 MG/2ML IJ SOLN
4.0000 mg | Freq: Once | INTRAMUSCULAR | Status: DC
  Filled 2015-06-03: qty 2

## 2015-06-03 MED ORDER — MORPHINE SULFATE (PF) 4 MG/ML IV SOLN
4.0000 mg | Freq: Once | INTRAVENOUS | Status: DC
  Filled 2015-06-03: qty 1

## 2015-06-03 MED ORDER — OXYCODONE HCL 5 MG PO TABS
5.0000 mg | ORAL_TABLET | ORAL | Status: DC | PRN
Start: 2015-06-03 — End: 2015-06-06

## 2015-06-03 MED ORDER — ACETAMINOPHEN 500 MG PO TABS
1000.0000 mg | ORAL_TABLET | Freq: Once | ORAL | Status: AC
  Administered 2015-06-03: 1000 mg via ORAL
  Filled 2015-06-03: qty 2

## 2015-06-03 MED ORDER — IBUPROFEN 800 MG PO TABS
800.0000 mg | ORAL_TABLET | Freq: Once | ORAL | Status: AC
  Administered 2015-06-03: 800 mg via ORAL
  Filled 2015-06-03: qty 1

## 2015-06-03 NOTE — ED Notes (Signed)
Called for pt in waiting room with no answer. 

## 2015-06-03 NOTE — Discharge Instructions (Signed)
Bone and Joint Infections, Adult °Bone infections (osteomyelitis) and joint infections (septic arthritis) occur when bacteria or other germs get inside a bone or a joint. This can happen if you have an infection in another part of your body that spreads through your blood. Germs from your skin or from outside of your body can also cause this type of infection if you have a wound or a broken bone (fracture) that breaks the skin. °Anyone can get a bone infection or joint infection. You may be more likely to get this type of infection if you have a condition, such as diabetes, that lowers your ability to fight infection or increases your chances of getting an infection. Bone and joint infections can cause damage, and they can spread to other areas of your body. They need to be treated quickly. °CAUSES °Most bone and joint infections are caused by bacteria. They can also be caused by other germs, such as viruses and funguses. °RISK FACTORS °This condition is more likely to develop in: °· People who recently had surgery, especially bone or joint surgery. °· People who have a long-term (chronic) disease, such as: °¨ HIV (human immunodeficiency virus). °¨ Diabetes. °¨ Rheumatoid arthritis. °¨ Sickle cell anemia. °· Elderly people. °· People who take medicines that block or weaken the body's defense system (immune system). °· People who have a condition that reduces their blood flow. °· People who are on kidney dialysis. °· People who have an artificial joint. °· People who have had a joint or bone repaired with plates or screws (surgical hardware). °· People who use or abuse IV drugs. °· People who have had trauma, such as stepping on a nail. °SYMPTOMS °Symptoms vary depending on the type and location of your infection. Common symptoms of bone and joint infections include: °· Fever and chills. °· Redness and warmth. °· Swelling. °· Pain and stiffness. °· Drainage of fluid or pus near the infection. °· Weight loss and  fatigue. °· Decreased ability to use a hand or foot. °DIAGNOSIS °This condition may be diagnosed based on symptoms, medical history, a physical exam, and diagnostic tests. Tests can help to identify the cause of the infection. You may have various tests, such as: °· A sample of tissue, fluid, or blood taken to be examined under a microscope. °· A procedure to remove fluid from the infected joint with a needle (joint aspiration) for testing in a lab. °· Pus or discharge swabbed from a wound for testing to identify germs and to determine what type of medicine will kill them (culture and sensitivity). °· Blood tests to look for evidence of infection and inflammation (biomarkers). °· Imaging studies to determine how severe the bone or joint infection is. These may include: °¨ X-rays. °¨ CT scan. °¨ MRI. °¨ Bone scan. °TREATMENT °Treatment depends on the cause and type of infection. Antibiotic medicines are usually the first treatment for a bone or joint infection. Treatment with antibiotics may include: °· Getting IV antibiotics. This may be done in a hospital at first. You may have to continue IV antibiotics at home for several weeks. You may also have to take antibiotics by mouth for several weeks after that. °· Taking more than one kind of antibiotic. Treatment may start with a type of antibiotic that works against many different bacteria (broad spectrum antibiotics). IV antibiotics may be changed if tests show that another type may work better. °Other treatments may include: °· Draining fluid from the joint by placing a needle into   it (aspiration). °· Surgery to remove: °¨ Dead or dying tissue from a bone or joint. °¨ An infected artificial joint. °¨ Infected plates or screws that were used to repair a broken bone. °HOME CARE INSTRUCTIONS °· Take medicines only as directed by your health care provider. °· Take your antibiotic medicine as directed by your health care provider. Finish the antibiotic even if you start  to feel better. °· Follow instructions from your health care provider about how to take IV antibiotics at home. °· Ask your health care provider if you have any restrictions on your activities. °· Keep all follow-up visits as directed by your health care provider. This is important. °SEEK MEDICAL CARE IF: °· You have a fever or chills. °· You have redness, warmth, pain, or swelling that returns after treatment. °SEEK IMMEDIATE MEDICAL CARE IF: °· You have rapid breathing or you have trouble breathing. °· You have chest pain. °· You cannot drink fluids or make urine. °· The affected arm or leg swells, changes color, or turns blue. °  °This information is not intended to replace advice given to you by your health care provider. Make sure you discuss any questions you have with your health care provider. °  °Document Released: 04/28/2005 Document Revised: 09/12/2014 Document Reviewed: 04/26/2014 °Elsevier Interactive Patient Education ©2016 [employer] ° °

## 2015-06-03 NOTE — ED Notes (Signed)
Called OR, talked with scheduler, who advised time of surgery not known at this time.  Advised pt will be called with surgery time.  Advised pt same.

## 2015-06-03 NOTE — ED Notes (Signed)
Dr. Costella Hatcher called to advise pt can be d/c today after CT ankle.  ED MD to write pain med prescription.  Pt will f/u Tuesday for surgery with Dr. Eulah Pont.  D/C instructions elevate leg, limit weight bearing.  Mark erythema on skin with skin marker.  Dr. Adela Lank advised.
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
## 2015-06-03 NOTE — ED Notes (Signed)
Patient presents with left outer ankle where a screw head is coming out of the skin.  Spoke with Dt Renaye Rakers and was told to come to the ed

## 2015-06-03 NOTE — ED Notes (Signed)
Paged Dr Murphy

## 2015-06-03 NOTE — ED Notes (Signed)
Red area noted to the inside of the left ankle.  Not warm to touch

## 2015-06-04 ENCOUNTER — Encounter (HOSPITAL_COMMUNITY): Payer: Self-pay | Admitting: *Deleted
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
## 2015-06-05 ENCOUNTER — Encounter (HOSPITAL_COMMUNITY)
Admission: RE | Disposition: A | Discharge status: Home Health | Payer: Self-pay | Source: Ambulatory Visit | Attending: Orthopedic Surgery

## 2015-06-05 ENCOUNTER — Inpatient Hospital Stay (HOSPITAL_COMMUNITY)
Admission: RE | Admit: 2015-06-05 | Discharge: 2015-06-06 | DRG: 857 | Disposition: A | Discharge status: Home Health | Payer: Self-pay | Source: Ambulatory Visit | Attending: Orthopedic Surgery | Admitting: Orthopedic Surgery

## 2015-06-05 ENCOUNTER — Ambulatory Visit (HOSPITAL_COMMUNITY)

## 2015-06-05 ENCOUNTER — Ambulatory Visit (HOSPITAL_COMMUNITY): Payer: Self-pay | Admitting: Certified Registered"

## 2015-06-05 DIAGNOSIS — T84038A Mechanical loosening of other internal prosthetic joint, initial encounter: Secondary | ICD-10-CM | POA: Diagnosis present

## 2015-06-05 DIAGNOSIS — M869 Osteomyelitis, unspecified: Secondary | ICD-10-CM | POA: Diagnosis present

## 2015-06-05 DIAGNOSIS — J45909 Unspecified asthma, uncomplicated: Secondary | ICD-10-CM | POA: Diagnosis present

## 2015-06-05 DIAGNOSIS — L02416 Cutaneous abscess of left lower limb: Secondary | ICD-10-CM | POA: Diagnosis present

## 2015-06-05 DIAGNOSIS — Z885 Allergy status to narcotic agent status: Secondary | ICD-10-CM

## 2015-06-05 DIAGNOSIS — Z959 Presence of cardiac and vascular implant and graft, unspecified: Secondary | ICD-10-CM

## 2015-06-05 DIAGNOSIS — S82202H Unspecified fracture of shaft of left tibia, subsequent encounter for open fracture type I or II with delayed healing: Secondary | ICD-10-CM

## 2015-06-05 DIAGNOSIS — S82202A Unspecified fracture of shaft of left tibia, initial encounter for closed fracture: Secondary | ICD-10-CM | POA: Diagnosis present

## 2015-06-05 DIAGNOSIS — I10 Essential (primary) hypertension: Secondary | ICD-10-CM | POA: Diagnosis present

## 2015-06-05 DIAGNOSIS — K219 Gastro-esophageal reflux disease without esophagitis: Secondary | ICD-10-CM | POA: Diagnosis present

## 2015-06-05 DIAGNOSIS — W3400XA Accidental discharge from unspecified firearms or gun, initial encounter: Secondary | ICD-10-CM

## 2015-06-05 DIAGNOSIS — T814XXA Infection following a procedure, initial encounter: Principal | ICD-10-CM | POA: Diagnosis present

## 2015-06-05 DIAGNOSIS — F17210 Nicotine dependence, cigarettes, uncomplicated: Secondary | ICD-10-CM | POA: Diagnosis present

## 2015-06-05 DIAGNOSIS — Z888 Allergy status to other drugs, medicaments and biological substances status: Secondary | ICD-10-CM

## 2015-06-05 DIAGNOSIS — S81839A Puncture wound without foreign body, unspecified lower leg, initial encounter: Secondary | ICD-10-CM

## 2015-06-05 DIAGNOSIS — B9561 Methicillin susceptible Staphylococcus aureus infection as the cause of diseases classified elsewhere: Secondary | ICD-10-CM | POA: Diagnosis present

## 2015-06-05 HISTORY — PX: HARDWARE REMOVAL: SHX979

## 2015-06-05 HISTORY — DX: Unspecified osteoarthritis, unspecified site: M19.90

## 2015-06-05 HISTORY — DX: Peripheral vascular disease, unspecified: I73.9

## 2015-06-05 HISTORY — PX: I & D EXTREMITY: SHX5045

## 2015-06-05 SURGERY — REMOVAL, HARDWARE
Anesthesia: General | Site: Leg Lower | Laterality: Left

## 2015-06-05 MED ORDER — OMEPRAZOLE MAGNESIUM 20 MG PO TBEC
20.0000 mg | DELAYED_RELEASE_TABLET | Freq: Two times a day (BID) | ORAL | Status: DC
Start: 2015-06-05 — End: 2015-06-05

## 2015-06-05 MED ORDER — OXYCODONE HCL 5 MG PO TABS: 5.0000 mg | ORAL_TABLET | ORAL | Status: DC | PRN

## 2015-06-05 MED ORDER — HYDROMORPHONE HCL 1 MG/ML IJ SOLN: 0.2500 mg | INTRAMUSCULAR | Status: DC | PRN

## 2015-06-05 MED ORDER — PROMETHAZINE HCL 25 MG/ML IJ SOLN: 6.2500 mg | INTRAMUSCULAR | Status: DC | PRN

## 2015-06-05 MED ORDER — OXYCODONE HCL 5 MG PO TABS
5.0000 mg | ORAL_TABLET | ORAL | Status: DC | PRN
  Administered 2015-06-05 – 2015-06-06 (×7): 10 mg via ORAL
  Filled 2015-06-05 (×8): qty 2

## 2015-06-05 MED ORDER — PHENYLEPHRINE HCL 10 MG/ML IJ SOLN
INTRAMUSCULAR | Status: DC | PRN
  Administered 2015-06-05: 80 ug via INTRAVENOUS

## 2015-06-05 MED ORDER — METOCLOPRAMIDE HCL 5 MG PO TABS: 5.0000 mg | ORAL_TABLET | Freq: Three times a day (TID) | ORAL | Status: DC | PRN

## 2015-06-05 MED ORDER — CEFAZOLIN SODIUM 1-5 GM-% IV SOLN
1.0000 g | Freq: Four times a day (QID) | INTRAVENOUS | Status: AC
  Administered 2015-06-05 – 2015-06-06 (×3): 1 g via INTRAVENOUS
  Filled 2015-06-05 (×3): qty 50

## 2015-06-05 MED ORDER — ALBUTEROL SULFATE (2.5 MG/3ML) 0.083% IN NEBU: 2.5000 mg | INHALATION_SOLUTION | Freq: Four times a day (QID) | RESPIRATORY_TRACT | Status: DC | PRN

## 2015-06-05 MED ORDER — POTASSIUM CHLORIDE IN NACL 20-0.45 MEQ/L-% IV SOLN
INTRAVENOUS | Status: DC
  Filled 2015-06-05: qty 1000

## 2015-06-05 MED ORDER — FENTANYL CITRATE (PF) 250 MCG/5ML IJ SOLN
INTRAMUSCULAR | Status: AC
  Filled 2015-06-05: qty 5

## 2015-06-05 MED ORDER — ACETAMINOPHEN 500 MG PO TABS: 1000.0000 mg | ORAL_TABLET | Freq: Once | ORAL | Status: DC

## 2015-06-05 MED ORDER — SUCCINYLCHOLINE CHLORIDE 20 MG/ML IJ SOLN
INTRAMUSCULAR | Status: AC
  Filled 2015-06-05: qty 1

## 2015-06-05 MED ORDER — FENTANYL CITRATE (PF) 100 MCG/2ML IJ SOLN
INTRAMUSCULAR | Status: DC | PRN
  Administered 2015-06-05 (×2): 50 ug via INTRAVENOUS
  Administered 2015-06-05: 25 ug via INTRAVENOUS
  Administered 2015-06-05: 50 ug via INTRAVENOUS
  Administered 2015-06-05: 25 ug via INTRAVENOUS
  Administered 2015-06-05: 100 ug via INTRAVENOUS

## 2015-06-05 MED ORDER — MIDAZOLAM HCL 5 MG/5ML IJ SOLN
INTRAMUSCULAR | Status: DC | PRN
  Administered 2015-06-05: 2 mg via INTRAVENOUS

## 2015-06-05 MED ORDER — ROCURONIUM BROMIDE 50 MG/5ML IV SOLN
INTRAVENOUS | Status: AC
  Filled 2015-06-05: qty 1

## 2015-06-05 MED ORDER — VITAMIN D 1000 UNITS PO TABS
2000.0000 [IU] | ORAL_TABLET | Freq: Every day | ORAL | Status: DC
  Administered 2015-06-05 – 2015-06-06 (×2): 2000 [IU] via ORAL
  Filled 2015-06-05 (×2): qty 2

## 2015-06-05 MED ORDER — CEFAZOLIN SODIUM-DEXTROSE 2-3 GM-% IV SOLR
2.0000 g | INTRAVENOUS | Status: AC
  Administered 2015-06-05: 2 g via INTRAVENOUS
  Filled 2015-06-05: qty 50

## 2015-06-05 MED ORDER — PROPOFOL 10 MG/ML IV BOLUS
INTRAVENOUS | Status: AC
  Filled 2015-06-05: qty 20

## 2015-06-05 MED ORDER — HYDROMORPHONE HCL 1 MG/ML IJ SOLN
1.0000 mg | INTRAMUSCULAR | Status: DC | PRN
  Administered 2015-06-05 – 2015-06-06 (×5): 1 mg via INTRAVENOUS
  Filled 2015-06-05 (×6): qty 1

## 2015-06-05 MED ORDER — ASPIRIN 325 MG PO TABS
325.0000 mg | ORAL_TABLET | Freq: Every day | ORAL | Status: DC
  Administered 2015-06-05 – 2015-06-06 (×2): 325 mg via ORAL
  Filled 2015-06-05 (×2): qty 1

## 2015-06-05 MED ORDER — POTASSIUM CHLORIDE IN NACL 20-0.45 MEQ/L-% IV SOLN
INTRAVENOUS | Status: DC
  Administered 2015-06-05: 18:00:00 via INTRAVENOUS
  Filled 2015-06-05 (×4): qty 1000

## 2015-06-05 MED ORDER — HYDROMORPHONE HCL 1 MG/ML IJ SOLN
0.2500 mg | INTRAMUSCULAR | Status: DC | PRN
  Administered 2015-06-05 (×4): 0.5 mg via INTRAVENOUS

## 2015-06-05 MED ORDER — HYDROMORPHONE HCL 1 MG/ML IJ SOLN
INTRAMUSCULAR | Status: AC
  Filled 2015-06-05: qty 1

## 2015-06-05 MED ORDER — METHOCARBAMOL 1000 MG/10ML IJ SOLN
500.0000 mg | Freq: Four times a day (QID) | INTRAMUSCULAR | Status: DC | PRN
  Filled 2015-06-05: qty 5

## 2015-06-05 MED ORDER — METHOCARBAMOL 500 MG PO TABS
500.0000 mg | ORAL_TABLET | Freq: Four times a day (QID) | ORAL | Status: DC | PRN
  Administered 2015-06-05 – 2015-06-06 (×2): 500 mg via ORAL
  Filled 2015-06-05 (×2): qty 1

## 2015-06-05 MED ORDER — SODIUM CHLORIDE 0.9 % IR SOLN
Status: DC | PRN
  Administered 2015-06-05 (×3): 3000 mL

## 2015-06-05 MED ORDER — VANCOMYCIN HCL 10 G IV SOLR
1500.0000 mg | INTRAVENOUS | Status: DC
  Filled 2015-06-05: qty 1500

## 2015-06-05 MED ORDER — ONDANSETRON HCL 4 MG/2ML IJ SOLN: 4.0000 mg | Freq: Four times a day (QID) | INTRAMUSCULAR | Status: DC | PRN

## 2015-06-05 MED ORDER — CHLORHEXIDINE GLUCONATE 4 % EX LIQD: 60.0000 mL | Freq: Once | CUTANEOUS | Status: DC

## 2015-06-05 MED ORDER — ACETAMINOPHEN 650 MG RE SUPP: 650.0000 mg | Freq: Four times a day (QID) | RECTAL | Status: DC | PRN

## 2015-06-05 MED ORDER — LACTATED RINGERS IV SOLN
INTRAVENOUS | Status: DC
  Administered 2015-06-05 (×3): via INTRAVENOUS

## 2015-06-05 MED ORDER — PROPOFOL 10 MG/ML IV BOLUS
INTRAVENOUS | Status: DC | PRN
  Administered 2015-06-05: 200 mg via INTRAVENOUS

## 2015-06-05 MED ORDER — PANTOPRAZOLE SODIUM 40 MG PO TBEC
40.0000 mg | DELAYED_RELEASE_TABLET | Freq: Two times a day (BID) | ORAL | Status: DC
  Administered 2015-06-05 – 2015-06-06 (×2): 40 mg via ORAL
  Filled 2015-06-05 (×2): qty 1

## 2015-06-05 MED ORDER — ACETAMINOPHEN 325 MG PO TABS: 650.0000 mg | ORAL_TABLET | Freq: Four times a day (QID) | ORAL | Status: DC | PRN

## 2015-06-05 MED ORDER — PHENYLEPHRINE 40 MCG/ML (10ML) SYRINGE FOR IV PUSH (FOR BLOOD PRESSURE SUPPORT)
PREFILLED_SYRINGE | INTRAVENOUS | Status: AC
  Filled 2015-06-05: qty 20

## 2015-06-05 MED ORDER — ALBUTEROL SULFATE HFA 108 (90 BASE) MCG/ACT IN AERS: 1.0000 | INHALATION_SPRAY | Freq: Four times a day (QID) | RESPIRATORY_TRACT | Status: DC | PRN

## 2015-06-05 MED ORDER — DOCUSATE SODIUM 100 MG PO CAPS: 100.0000 mg | ORAL_CAPSULE | Freq: Two times a day (BID) | ORAL | Status: DC

## 2015-06-05 MED ORDER — ONDANSETRON HCL 4 MG PO TABS: 4.0000 mg | ORAL_TABLET | Freq: Three times a day (TID) | ORAL | Status: DC | PRN

## 2015-06-05 MED ORDER — ONDANSETRON HCL 4 MG/2ML IJ SOLN
INTRAMUSCULAR | Status: DC | PRN
  Administered 2015-06-05: 4 mg via INTRAVENOUS

## 2015-06-05 MED ORDER — ASPIRIN 325 MG PO TABS: 325.0000 mg | ORAL_TABLET | Freq: Every day | ORAL | Status: DC

## 2015-06-05 MED ORDER — METHOCARBAMOL 500 MG PO TABS: 500.0000 mg | ORAL_TABLET | Freq: Four times a day (QID) | ORAL | Status: DC

## 2015-06-05 MED ORDER — ONDANSETRON HCL 4 MG PO TABS: 4.0000 mg | ORAL_TABLET | Freq: Four times a day (QID) | ORAL | Status: DC | PRN

## 2015-06-05 MED ORDER — LIDOCAINE HCL (CARDIAC) 20 MG/ML IV SOLN
INTRAVENOUS | Status: DC | PRN
  Administered 2015-06-05: 60 mg via INTRAVENOUS

## 2015-06-05 MED ORDER — VANCOMYCIN HCL IN DEXTROSE 1-5 GM/200ML-% IV SOLN
1000.0000 mg | Freq: Three times a day (TID) | INTRAVENOUS | Status: DC
  Administered 2015-06-05 – 2015-06-06 (×4): 1000 mg via INTRAVENOUS
  Filled 2015-06-05 (×5): qty 200

## 2015-06-05 MED ORDER — METOCLOPRAMIDE HCL 5 MG/ML IJ SOLN: 5.0000 mg | Freq: Three times a day (TID) | INTRAMUSCULAR | Status: DC | PRN

## 2015-06-05 MED ORDER — DOCUSATE SODIUM 100 MG PO CAPS
100.0000 mg | ORAL_CAPSULE | Freq: Two times a day (BID) | ORAL | Status: DC
  Administered 2015-06-05 – 2015-06-06 (×2): 100 mg via ORAL
  Filled 2015-06-05 (×2): qty 1

## 2015-06-05 MED ORDER — DEXTROSE 5 % IV SOLN
500.0000 mg | INTRAVENOUS | Status: AC
  Administered 2015-06-05: 500 mg via INTRAVENOUS
  Filled 2015-06-05: qty 5

## 2015-06-05 MED ORDER — LISINOPRIL 40 MG PO TABS
40.0000 mg | ORAL_TABLET | Freq: Every day | ORAL | Status: DC
  Administered 2015-06-05 – 2015-06-06 (×2): 40 mg via ORAL
  Filled 2015-06-05 (×2): qty 1

## 2015-06-05 SURGICAL SUPPLY — 73 items
BANDAGE ELASTIC 4 VELCRO ST LF (GAUZE/BANDAGES/DRESSINGS) ×8 IMPLANT
BANDAGE ESMARK 6X9 LF (GAUZE/BANDAGES/DRESSINGS) ×3 IMPLANT
BNDG COHESIVE 4X5 TAN STRL (GAUZE/BANDAGES/DRESSINGS) ×4 IMPLANT
BNDG ELASTIC 6X10 VLCR STRL LF (GAUZE/BANDAGES/DRESSINGS) ×4 IMPLANT
BNDG ESMARK 6X9 LF (GAUZE/BANDAGES/DRESSINGS) ×4
BNDG GAUZE ELAST 4 BULKY (GAUZE/BANDAGES/DRESSINGS) ×4 IMPLANT
CANISTER SUCTION 2500CC (MISCELLANEOUS) ×4 IMPLANT
CANISTER WOUND CARE 500ML ATS (WOUND CARE) ×4 IMPLANT
CONNECTOR Y ATS VAC SYSTEM (MISCELLANEOUS) ×4 IMPLANT
CONT SPEC 4OZ CLIKSEAL STRL BL (MISCELLANEOUS) ×4 IMPLANT
COVER SURGICAL LIGHT HANDLE (MISCELLANEOUS) ×4 IMPLANT
CUFF TOURNIQUET SINGLE 34IN LL (TOURNIQUET CUFF) IMPLANT
DRAPE C-ARM 42X72 X-RAY (DRAPES) IMPLANT
DRAPE C-ARMOR (DRAPES) IMPLANT
DRAPE INCISE IOBAN 66X45 STRL (DRAPES) ×4 IMPLANT
DRAPE OEC MINIVIEW 54X84 (DRAPES) ×4 IMPLANT
DRAPE ORTHO SPLIT 77X108 STRL (DRAPES)
DRAPE SURG ORHT 6 SPLT 77X108 (DRAPES) IMPLANT
DRAPE U-SHAPE 47X51 STRL (DRAPES) IMPLANT
DRSG MEPILEX BORDER 4X4 (GAUZE/BANDAGES/DRESSINGS) ×4 IMPLANT
DRSG MEPITEL 4X7.2 (GAUZE/BANDAGES/DRESSINGS) ×4 IMPLANT
DRSG PAD ABDOMINAL 8X10 ST (GAUZE/BANDAGES/DRESSINGS) ×8 IMPLANT
DRSG VAC ATS SM SENSATRAC (GAUZE/BANDAGES/DRESSINGS) ×4 IMPLANT
DURAPREP 26ML APPLICATOR (WOUND CARE) ×4 IMPLANT
ELECT REM PT RETURN 9FT ADLT (ELECTROSURGICAL) ×4
ELECTRODE REM PT RTRN 9FT ADLT (ELECTROSURGICAL) ×3 IMPLANT
GAUZE SPONGE 4X4 12PLY STRL (GAUZE/BANDAGES/DRESSINGS) ×4 IMPLANT
GLOVE BIO SURGEON STRL SZ7 (GLOVE) ×4 IMPLANT
GLOVE BIO SURGEON STRL SZ7.5 (GLOVE) ×8 IMPLANT
GLOVE BIO SURGEON STRL SZ8 (GLOVE) IMPLANT
GLOVE BIOGEL PI IND STRL 7.0 (GLOVE) ×3 IMPLANT
GLOVE BIOGEL PI IND STRL 8 (GLOVE) ×3 IMPLANT
GLOVE BIOGEL PI INDICATOR 7.0 (GLOVE) ×1
GLOVE BIOGEL PI INDICATOR 8 (GLOVE) ×1
GLOVE BIOGEL PI ORTHO PRO SZ8 (GLOVE)
GLOVE PI ORTHO PRO STRL SZ8 (GLOVE) IMPLANT
GOWN STRL REUS W/ TWL LRG LVL3 (GOWN DISPOSABLE) ×6 IMPLANT
GOWN STRL REUS W/ TWL XL LVL3 (GOWN DISPOSABLE) ×3 IMPLANT
GOWN STRL REUS W/TWL 2XL LVL3 (GOWN DISPOSABLE) IMPLANT
GOWN STRL REUS W/TWL LRG LVL3 (GOWN DISPOSABLE) ×2
GOWN STRL REUS W/TWL XL LVL3 (GOWN DISPOSABLE) ×1
GUIDEWIRE GAMMA 800 (WIRE) ×4 IMPLANT
KIT BASIN OR (CUSTOM PROCEDURE TRAY) ×4 IMPLANT
KIT ROOM TURNOVER OR (KITS) ×4 IMPLANT
MANIFOLD NEPTUNE II (INSTRUMENTS) ×4 IMPLANT
NEEDLE 22X1 1/2 (OR ONLY) (NEEDLE) ×4 IMPLANT
NS IRRIG 1000ML POUR BTL (IV SOLUTION) ×4 IMPLANT
PACK ORTHO EXTREMITY (CUSTOM PROCEDURE TRAY) ×4 IMPLANT
PAD ARMBOARD 7.5X6 YLW CONV (MISCELLANEOUS) ×8 IMPLANT
PAD CAST 4YDX4 CTTN HI CHSV (CAST SUPPLIES) ×6 IMPLANT
PAD NEG PRESSURE SENSATRAC (MISCELLANEOUS) ×4 IMPLANT
PADDING CAST COTTON 4X4 STRL (CAST SUPPLIES) ×2
REAMER SHAFT BIXCUT (INSTRUMENTS) ×4 IMPLANT
SET CYSTO W/LG BORE CLAMP LF (SET/KITS/TRAYS/PACK) ×4 IMPLANT
SPONGE GAUZE 4X4 12PLY STER LF (GAUZE/BANDAGES/DRESSINGS) ×4 IMPLANT
SPONGE LAP 18X18 X RAY DECT (DISPOSABLE) ×4 IMPLANT
STRIP CLOSURE SKIN 1/2X4 (GAUZE/BANDAGES/DRESSINGS) ×4 IMPLANT
SUCTION FRAZIER HANDLE 10FR (MISCELLANEOUS) ×1
SUCTION TUBE FRAZIER 10FR DISP (MISCELLANEOUS) ×3 IMPLANT
SUT ETHILON 3 0 PS 1 (SUTURE) ×16 IMPLANT
SUT MNCRL AB 4-0 PS2 18 (SUTURE) ×4 IMPLANT
SUT MON AB 2-0 CT1 27 (SUTURE) IMPLANT
SUT VIC AB 0 CT1 27 (SUTURE)
SUT VIC AB 0 CT1 27XBRD ANBCTR (SUTURE) IMPLANT
SUT VICRYL 0 UR6 27IN ABS (SUTURE) ×4 IMPLANT
SYR BULB IRRIGATION 50ML (SYRINGE) ×4 IMPLANT
SYR CONTROL 10ML LL (SYRINGE) IMPLANT
TOWEL OR 17X24 6PK STRL BLUE (TOWEL DISPOSABLE) ×4 IMPLANT
TOWEL OR 17X26 10 PK STRL BLUE (TOWEL DISPOSABLE) ×4 IMPLANT
TUBE CONNECTING 12X1/4 (SUCTIONS) ×8 IMPLANT
UNDERPAD 30X30 INCONTINENT (UNDERPADS AND DIAPERS) ×8 IMPLANT
WATER STERILE IRR 1000ML POUR (IV SOLUTION) ×4 IMPLANT
YANKAUER SUCT BULB TIP NO VENT (SUCTIONS) ×4 IMPLANT

## 2015-06-05 NOTE — Transfer of Care (Signed)
Immediate Anesthesia Transfer of Care Note  Patient: Vincent Clarke  Procedure(s) Performed: Procedure(s): REMOVAL Left Ankle Hardware and Tibial Nail (Left) 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 (Left)  Patient Location: PACU  Anesthesia Type:General  Level of Consciousness: awake, alert  and oriented  Airway & Oxygen Therapy: Patient connected to face mask oxygen  Post-op Assessment: Report given to RN  Post vital signs: stable  Last Vitals:  Filed Vitals:   06/05/15 0930 06/05/15 0931  BP: 149/89   Pulse: 91   Temp:  36.7 C  Resp: 18     Complications: No apparent anesthesia complications

## 2015-06-05 NOTE — Anesthesia Procedure Notes (Signed)
Procedure Name: LMA Insertion Date/Time: 06/05/2015 12:45 PM Performed by: Dorie Rank Pre-anesthesia Checklist: Patient identified, Emergency Drugs available, Suction available, Patient being monitored and Timeout performed Patient Re-evaluated:Patient Re-evaluated prior to inductionOxygen Delivery Method: Circle system utilized Preoxygenation: Pre-oxygenation with 100% oxygen Intubation Type: IV induction Ventilation: Mask ventilation without difficulty LMA: LMA inserted LMA Size: 4.0 Tube type: Oral Number of attempts: 1 Tube secured with: Tape Dental Injury: Teeth and Oropharynx as per pre-operative assessment

## 2015-06-05 NOTE — Discharge Instructions (Signed)
Keep dressings clean and dry till follow, wound vac dressings will be changed by the home nurse M/W/F.  Non-weight bearing in the L leg  ASA  daily for DVT prophylaxis for 30 days

## 2015-06-05 NOTE — Anesthesia Postprocedure Evaluation (Signed)
Anesthesia Post Note  Patient: Vincent Clarke  Procedure(s) Performed: Procedure(s) (LRB): REMOVAL Left Ankle Hardware and Tibial Nail (Left) 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 (Left)  Patient location during evaluation: PACU Anesthesia Type: General Level of consciousness: awake and alert Pain management: pain level controlled Vital Signs Assessment: post-procedure vital signs reviewed and stable Respiratory status: spontaneous breathing, nonlabored ventilation, respiratory function stable and patient connected to nasal cannula oxygen Cardiovascular status: stable (Blood pressure is elevated with systolics at 170, patient chronically hypertensive in 140s and takes lisinoprile, daily dose given and will be patient to see effect) Postop Assessment: no signs of nausea or vomiting Anesthetic complications: no    Last Vitals:  Filed Vitals:   06/05/15 1620 06/05/15 1632  BP: 181/104 182/102  Pulse: 77   Temp:    Resp: 18     Last Pain:  Filed Vitals:   06/05/15 1644  PainSc: 4                  Reino Kent

## 2015-06-05 NOTE — Anesthesia Preprocedure Evaluation (Addendum)
Anesthesia Evaluation  Patient identified by MRN, date of birth, ID band Patient awake    Reviewed: Allergy & Precautions, NPO status , Patient's Chart, lab work & pertinent test results  Airway Mallampati: II  TM Distance: >3 FB Neck ROM: Full    Dental no notable dental hx. (+) Edentulous Upper, Edentulous Lower, Dental Advisory Given   Pulmonary asthma , Current Smoker,    Pulmonary exam normal breath sounds clear to auscultation       Cardiovascular hypertension, Pt. on medications + Peripheral Vascular Disease  Normal cardiovascular exam Rhythm:Regular Rate:Normal     Neuro/Psych negative neurological ROS  negative psych ROS   GI/Hepatic Neg liver ROS, GERD  Medicated and Controlled,  Endo/Other  negative endocrine ROS  Renal/GU negative Renal ROS  negative genitourinary   Musculoskeletal negative musculoskeletal ROS (+) Arthritis ,   Abdominal (+)  Abdomen: soft. Bowel sounds: normal.  Peds negative pediatric ROS (+)  Hematology negative hematology ROS (+)   Anesthesia Other Findings   Reproductive/Obstetrics negative OB ROS                          Anesthesia Physical Anesthesia Plan  ASA: II  Anesthesia Plan: General   Post-op Pain Management:    Induction: Intravenous  Airway Management Planned: LMA  Additional Equipment:   Intra-op Plan:   Post-operative Plan: Extubation in OR  Informed Consent:   Dental advisory given  Plan Discussed with: CRNA and Surgeon  Anesthesia Plan Comments:         Anesthesia Quick Evaluation

## 2015-06-05 NOTE — Op Note (Signed)
06/05/2015  2:24 PM  PATIENT:  Vincent Clarke    PRE-OPERATIVE DIAGNOSIS:  Left Ankle Abscess  POST-OPERATIVE DIAGNOSIS:  Same  PROCEDURE:  REMOVAL Left Ankle Hardware and Tibial Nail, 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  SURGEON:  Chirsty Armistead, Jewel Baize, MD  ASSISTANT: Janalee Dane, PA-C, She was present and scrubbed throughout the case, critical for completion in a timely fashion, and for retraction, instrumentation, and closure.   ANESTHESIA:   gen  PREOPERATIVE INDICATIONS:  Vincent Clarke is a  50 y.o. male with a diagnosis of Left Ankle Abscess who failed conservative measures and elected for surgical management.    The risks benefits and alternatives were discussed with the patient preoperatively including but not limited to the risks of infection, bleeding, nerve injury, cardiopulmonary complications, the need for revision surgery, among others, and the patient was willing to proceed.  OPERATIVE IMPLANTS: none  OPERATIVE FINDINGS: loosened hardware. Stable fracture  BLOOD LOSS: min  COMPLICATIONS: none  TOURNIQUET TIME:  OPERATIVE PROCEDURE:  Patient was identified in the preoperative holding area and site was marked by me He was transported to the operating theater and placed on the table in supine position taking care to pad all bony prominences. After a preincinduction time out anesthesia was induced. The left lower extremity was prepped and draped in normal sterile fashion and a pre-incision timeout was performed. He received ancef for preoperative antibiotics.   He had a small open wound over the fibular incision of roughly 1/2 cm. A sterile fibular side and incised the tissue in this region. I made a 7 cm incision through his previous incision. I protected his superficial peroneal nerve and I debrided down to the plate. I removed the remaining screws which came out hole I also removed the plate and also came out hole. I rather a tissue culture  from the site and sent it to the lab.  I then thoroughly debrided this region and his fracture site. I irrigated this with 3 L of saline. It did communicate with a soft spot on the anterior aspect of his tibia I probed and the tear there is no field fluid collection in this region.  I then incised the previous incisions for distal interlock placement I removed his 2 distal interlock screws.  Next I made the and his previous incision at his proximal tibia I split his patella tendon in line with the fibers and gained control of the nail proximally.  On his medial proximal interlock there is a scabbed over portion I ellipsed out this scab portion. I removed both his proximal interlock the nail then came out hole I sent contents of the intramedullary canal of the nail to the lab as well.  I then reamed the canal with a 10 mm reamer. I then irrigated the canal of the tibia with 6 L of saline. I irrigated all incisions I performed a complex closure at both the fibula and the interlock site ellipsing out of tissue. All wounds were able to be closed and placed a wound VAC over the fibular incision. These were all closed loosely with nylon stitches to allow for drainage as needed. Sterile dressings were placed over the remainder the incisions he was placed in a short leg boot and taken to the PACU in stable condition.  POST OPERATIVE PLAN: He'll mobilize and take aspirin for DVT prophylaxis. He'll be nonweightbearing of this left lower extremity prior to discharge will transition to a splint.  This note was generated using a template and dragon dictation system. In light of that, I have reviewed the note and all aspects of it are applicable to this case. Any dictation errors are due to the computerized dictation system.

## 2015-06-05 NOTE — Interval H&P Note (Signed)
History and Physical Interval Note:  06/05/2015 6:30 AM  [name]  has presented today for surgery, with the diagnosis of abscess  The various methods of treatment have been discussed with the patient and family. After consideration of risks, benefits and other options for treatment, the patient has consented to  Procedure(s): I AND D ABSCESS LEG HARDWARE REMOVAL (Left) as a surgical intervention .  The patient's history has been reviewed, patient examined, no change in status, stable for surgery.  I have reviewed the patient's chart and labs.  Questions were answered to the patient's satisfaction.     Dakwan Pridgen D

## 2015-06-05 NOTE — Progress Notes (Signed)
ANTIBIOTIC CONSULT NOTE - INITIAL  Pharmacy Consult for Vancomycin Indication: wound infection  Allergies  Allergen Reactions  . Flexeril [Cyclobenzaprine] Swelling    Facial swelling  . Tramadol Other (See Comments)    unknown    Patient Measurements: Height:  (167.6 cm) Weight: 175 lb (79.379 kg) IBW/kg (Calculated) : 63.8 Adjusted Body Weight:   Vital Signs: Temp: 97.7 F (36.5 C) (01/24 1450) Temp Source: Oral (01/24 0930) BP: 187/108 mmHg (01/24 1649) Pulse Rate: 77 (01/24 1649) Intake/Output from previous day:   Intake/Output from this shift: Total I/O In: 1000 [I.V.:1000] Out: -   Labs:  Recent Labs  06/03/15 1041  WBC 9.3  HGB 12.9*  PLT 449*  CREATININE 0.63   Estimated Creatinine Clearance: 109.4 mL/min (by C-G formula based on Cr of 0.63). No results for input(s): VANCOTROUGH, VANCOPEAK, VANCORANDOM, GENTTROUGH, GENTPEAK, GENTRANDOM, TOBRATROUGH, TOBRAPEAK, TOBRARND, AMIKACINPEAK, AMIKACINTROU, AMIKACIN in the last 72 hours.   Microbiology: No results found for this or any previous visit (from the past 720 hour(s)).  Medical History: Past Medical History  Diagnosis Date  . Hypertension   . GERD (gastroesophageal reflux disease)   . Asthma   . Peripheral vascular disease (HCC) 12/2014    PV Bypass  . Arthritis     hand.  left leg    Medications:  Prescriptions prior to admission  Medication Sig Dispense Refill Last Dose  . albuterol (PROVENTIL HFA;VENTOLIN HFA) 108 (90 BASE) MCG/ACT inhaler Inhale 1-2 puffs into the lungs every 6 (six) hours as needed for wheezing or shortness of breath.   Past Week at Unknown time  . Cholecalciferol (VITAMIN D) 2000 units tablet Take 2,000 Units by mouth daily.   06/04/2015 at Unknown time  . lisinopril (PRINIVIL,ZESTRIL) 40 MG tablet Take 40 mg by mouth daily.   06/04/2015 at Unknown time  . omeprazole (PRILOSEC OTC) 20 MG tablet Take 20 mg by mouth 2 (two) times daily.    06/05/2015 at Unknown time  .  oxyCODONE (ROXICODONE) 5 MG immediate release tablet Take 1 tablet (5 mg total) by mouth every 4 (four) hours as needed for severe pain. 20 tablet 0 06/04/2015 at Unknown time   Scheduled:  . aspirin  325 mg Oral Daily  .  ceFAZolin (ANCEF) IV  1 g Intravenous Q6H  . cholecalciferol  2,000 Units Oral Daily  . docusate sodium  100 mg Oral BID  . HYDROmorphone      . HYDROmorphone      . lisinopril  40 mg Oral Daily  . omeprazole  20 mg Oral BID  . vancomycin  1,500 mg Intravenous Q24H   Infusions:  . 0.45 % NaCl with KCl 20 mEq / L     Assessment: 50yo male presents with left ankle abscess. Pharmacy is consulted to dose vancomycin for wound infection. Pt is afebrile, WBC 9.3, sCr 0.63. Will plan for higher trough due to location around previous open L tibial fx with prominent hardware.  Goal of Therapy:  Vancomycin trough level 15-20 mcg/ml  Plan:  Vancomycin 1g IV q8h Measure antibiotic drug levels at steady state Follow up culture results, renal function and clinical course  Arlean Hopping. Newman Pies, PharmD, BCPS Clinical Pharmacist Pager [phone] 06/05/2015,5:15 PM

## 2015-06-05 NOTE — Progress Notes (Signed)
Orthopedic Tech Progress Note Patient Details:  DOCTOR SHEAHAN 05-27-1965 [id number]  Ortho Devices Type of Ortho Device: CAM walker Ortho Device/Splint Interventions: Application   Saul Fordyce 06/05/2015, 2:23 PM

## 2015-06-05 NOTE — Interval H&P Note (Signed)
History and Physical Interval Note:  06/05/2015 12:34 PM  [name]  has presented today for surgery, with the diagnosis of abscess  The various methods of treatment have been discussed with the patient and family. After consideration of risks, benefits and other options for treatment, the patient has consented to  Procedure(s): I AND D ABSCESS LEG HARDWARE REMOVAL (Left) as a surgical intervention .  The patient's history has been reviewed, patient examined, no change in status, stable for surgery.  I have reviewed the patient's chart and labs.  Questions were answered to the patient's satisfaction.     Shantavia Jha D

## 2015-06-06 ENCOUNTER — Encounter (HOSPITAL_COMMUNITY): Payer: Self-pay | Admitting: Orthopedic Surgery

## 2015-06-06 DIAGNOSIS — T84623D Infection and inflammatory reaction due to internal fixation device of left tibia, subsequent encounter: Secondary | ICD-10-CM

## 2015-06-06 DIAGNOSIS — Y793 Surgical instruments, materials and orthopedic devices (including sutures) associated with adverse incidents: Secondary | ICD-10-CM

## 2015-06-06 DIAGNOSIS — L02416 Cutaneous abscess of left lower limb: Secondary | ICD-10-CM

## 2015-06-06 DIAGNOSIS — B9689 Other specified bacterial agents as the cause of diseases classified elsewhere: Secondary | ICD-10-CM

## 2015-06-06 LAB — VANCOMYCIN, TROUGH: Vancomycin Tr: 9 ug/mL — ABNORMAL LOW (ref 10.0–20.0)

## 2015-06-06 MED ORDER — SODIUM CHLORIDE 0.9% FLUSH: 10.0000 mL | INTRAVENOUS | Status: DC | PRN

## 2015-06-06 MED ORDER — VANCOMYCIN HCL IN DEXTROSE 1-5 GM/200ML-% IV SOLN: 1000.0000 mg | Freq: Three times a day (TID) | INTRAVENOUS | Status: DC

## 2015-06-06 MED ORDER — VANCOMYCIN HCL 10 G IV SOLR
1500.0000 mg | Freq: Three times a day (TID) | INTRAVENOUS | Status: DC
  Filled 2015-06-06: qty 1500

## 2015-06-06 NOTE — Progress Notes (Addendum)
Patient for discharge. Waiting for antibiotics(vancomycin) to complete which is currently infusing then the patient can go home. Medications and discharge instructions explained to the patient. He verbalized understanding. Copies given to him including original prescriptions.  Spoke to [employer] Scientist, research (physical sciences)) and asked her if ok to hang IV Vancomycin eventhough the results of Vanc trough still not out but already in process in the lab. She said ok to hang IV now.

## 2015-06-06 NOTE — Progress Notes (Signed)
Orthopedic Tech Progress Note Patient Details:  Vincent Clarke [birth year] [id number]  Patient ID: [name], male   DOB: 06-20-1965, 50 y.o.   MRN: [id number] Viewed order from doctor's order list  Nikki Dom 06/06/2015, 12:24 PM

## 2015-06-06 NOTE — Progress Notes (Signed)
Pharmacy Antibiotic Follow-up Note  Vincent Clarke is a 50 y.o. year-old male admitted on 06/05/2015.  The patient is currently on day #2 of vancomycin for L ankle abscess.  Assessment/Plan: Day #2 of abx for L ankle abscess. Hardware removed. ID seeing and recommends 6 weeks of IV abx. Afebrile. WBC wnl. SCr stable, CrCl ~169ml/min. VT was low at 9 today. Patient planning on going home soon, would be difficult to adjust for Q12 for ease of administration because of the high dose.  Plan: Increase vancomycin to 1,500mg  IV Q8 Monitor clinical picture, renal function, VT at new Css F/U C&S, abx deescalation / LOT   Temp (24hrs), Avg:98.5 F (36.9 C), Min:98 F (36.7 C), Max:98.9 F (37.2 C)   Recent Labs Lab 06/03/15 1041  WBC 9.3    Recent Labs Lab 06/03/15 1041  CREATININE 0.63   Estimated Creatinine Clearance: 109.4 mL/min (by C-G formula based on Cr of 0.63).    Allergies  Allergen Reactions  . Flexeril [Cyclobenzaprine] Swelling    Facial swelling  . Tramadol Other (See Comments)    unknown    Antimicrobials this admission: 1/24 vancomycin >> 3/6  Levels/dose changes this admission: 1/25 VT = 9 (Drawn a little late but still subtherapeutic on 1g IV Q8)  Microbiology results: 1/24 op cx from tibia: GPC in chains and pairs on gram stain  Thank you for allowing pharmacy to be a part of this patient's care.  Enzo Bi, PharmD, BCPS Clinical Pharmacist Pager [phone] 06/06/2015 7:23 PM

## 2015-06-06 NOTE — Care Management Note (Addendum)
Case Management Note  Patient Details  Name: Vincent Clarke MRN: [id number] Date of Birth: 01/08/1966  Subjective/Objective:            S/p I and D left ankle, osteomyelitis left ankle       Action/Plan: Spoke with patient and his wife about discharge plan. They stated that they are willing to learn to give IV antibiotics, they selected [facility]. Contacted Pam with [facility] and set up [facility] for IV antibiotics and wound VAC care and HHPT. Contacted Rickie with [employer] and faxed required information and received confirmation. Awaiting approval for home VAC. Will continue to follow.  Received call from Alexia at [facility], home wound vac approved and should be delivered to patient's room between 4:15pm and 5:15 pm. Updated patient and his nurse.     Expected Discharge Date:                  Expected Discharge Plan:  Home w Home Health Services  In-House Referral:  Financial Counselor  Discharge planning Services  CM Consult  Post Acute Care Choice:  Home Health Choice offered to:  Patient  DME Arranged:    DME Agency:     HH Arranged:  RN, PT, IV Antibiotics HH Agency:  [facility]  Status of Service:  In process, will continue to follow  Medicare Important Message Given:    Date Medicare IM Given:    Medicare IM give by:    Date Additional Medicare IM Given:    Additional Medicare Important Message give by:     If discussed at Long Length of Stay Meetings, dates discussed:    Additional Comments:  Monica Becton, RN 06/06/2015, 10:26 AM

## 2015-06-06 NOTE — Progress Notes (Signed)
Peripherally Inserted Central Catheter/Midline Placement  The IV Nurse has discussed with the patient and/or persons authorized to consent for the patient, the purpose of this procedure and the potential benefits and risks involved with this procedure.  The benefits include less needle sticks, lab draws from the catheter and patient may be discharged home with the catheter.  Risks include, but not limited to, infection, bleeding, blood clot (thrombus formation), and puncture of an artery; nerve damage and irregular heat beat.  Alternatives to this procedure were also discussed.  PICC/Midline Placement Documentation        Lisabeth Devoid 06/06/2015, 10:09 AM Consent obtained by Lazarus Gowda, RN

## 2015-06-06 NOTE — Progress Notes (Signed)
     Subjective:  POD#1 I/D of L leg with hardware removal due to infection. Patient reports pain as moderate.  Resting comfortably in bed this morning.  CAM boot was not in place.  Explained the importance of boot remaining on AT ALL TIMES!!!  Scheduled for picc line placement today.  Coordinating with care managers to set up home health for IV abx and wound vac maintenance.    Objective:   VITALS:   Filed Vitals:   06/05/15 2007 06/06/15 0046 06/06/15 0503 06/06/15 0514  BP: 199/101 169/93 147/78 141/79  Pulse: 88  82 89  Temp: 98 F (36.7 C) 98.9 F (37.2 C) 98.1 F (36.7 C) 98.8 F (37.1 C)  TempSrc: Oral Oral Oral Oral  Resp: Height:      Weight:      SpO2: 98%  98% 98%    Neurologically intact ABD soft Neurovascular intact Sensation intact distally Intact pulses distally Incision: dressing C/D/I Wound vac to the LLE  Lab Results  Component Value Date   WBC 9.3 06/03/2015   HGB 12.9* 06/03/2015   HCT 38.7* 06/03/2015   MCV 83.4 06/03/2015   PLT 449* 06/03/2015   BMET    Component Value Date/Time   NA 140 06/03/2015 1041   K 3.7 06/03/2015 1041   CL 105 06/03/2015 1041   CO2 24 06/03/2015 1041   GLUCOSE 96 06/03/2015 1041   BUN <5* 06/03/2015 1041   CREATININE 0.63 06/03/2015 1041   CALCIUM 9.1 06/03/2015 1041   GFRNONAA >60 06/03/2015 1041   GFRAA >60 06/03/2015 1041     Assessment/Plan: 1 Day Post-Op   Active Problems:   Abscess of left leg   Up with therapy NWB in the LLE CAM boot at all times Wound vac will be necessary for 10-14 days.  Picc line will be placed today.  Coordinating with ID for abx selection.  Will have home health arranged.  Possible discharge today if everything can be coordinated.   Vincent Clarke Vincent Clarke 06/06/2015, 8:39 AM Cell [phone]

## 2015-06-06 NOTE — Progress Notes (Signed)
[facility]  Patient Status:  New pt for [facility] this admission  [facility] is providing the following services: HHRN and Home Infusion Pharmacy team.  [facility] Infusion Coordinator has provided in hospital IV ABX teaching including set up and administration to support independence at home.  [facility] is planned for [facility] visit for admission/first home dose the A.M. Of 06-07-15.  Pt and wife aware of DC plan.   If patient discharges after hours, please call [phone].   Sedalia Muta 06/06/2015, 2:07 PM

## 2015-06-06 NOTE — Progress Notes (Signed)
OT Cancellation Note  Patient Details Name: Vincent Clarke MRN: [id number] DOB: 02-27-1966   Cancelled Treatment:    Reason Eval/Treat Not Completed: OT screened, no needs identified, will sign off. PT reported pt ambulating well and maintaining NWB status of LLE at all times. Pt reports he has necessary DME for bathroom from last time he had this injury and has 24/7 assistance from wife for all other ADLs and IADLs. Please re-consult if needs or sitaution changes. Thank you for this referral.  Nils Pyle, OTR/L Pager: [phone] 06/06/2015, 12:14 PM
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
## 2015-06-06 NOTE — Progress Notes (Signed)
Orthopedic Tech Progress Note Patient Details:  Vincent Clarke [birth year] [id number]  Ortho Devices Type of Ortho Device: Crutches Ortho Device/Splint Interventions: Application   Nikki Dom 06/06/2015, 12:23 PM

## 2015-06-07 LAB — HIV ANTIBODY (ROUTINE TESTING W REFLEX): HIV Screen 4th Generation wRfx: NONREACTIVE

## 2015-06-07 LAB — HCV COMMENT:

## 2015-06-07 LAB — HEPATITIS C ANTIBODY (REFLEX): HCV Ab: <0.1 s/co ratio (ref 0.0–0.9)

## 2015-06-07 NOTE — Progress Notes (Signed)
Utilization review completed.  

## 2015-06-08 LAB — TISSUE CULTURE

## 2015-06-10 LAB — ANAEROBIC CULTURE
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
## 2015-06-26 ENCOUNTER — Telehealth: Payer: Self-pay | Admitting: *Deleted

## 2015-06-26 ENCOUNTER — Other Ambulatory Visit: Payer: Self-pay | Admitting: Infectious Diseases

## 2015-06-26 ENCOUNTER — Inpatient Hospital Stay: Admitting: Infectious Diseases

## 2015-06-26 MED ORDER — CEFAZOLIN SODIUM-DEXTROSE 2-3 GM-% IV SOLR: 2.0000 g | Freq: Three times a day (TID) | INTRAVENOUS | Status: DC

## 2015-06-26 NOTE — Telephone Encounter (Signed)
Confirming current IV Antibiotic is Ancef 2 GM q8h.  Per [facility] Pharmacist the pt is currently on this drug.

## 2015-06-26 NOTE — Progress Notes (Signed)
Pt with MSSA in Cx Will change anbx to Ancef from Vanco He missed his hosp f/u appt today

## 2015-06-27 NOTE — Telephone Encounter (Signed)
Yes thanks 

## 2015-06-28 NOTE — Progress Notes (Signed)
thanks

## 2015-07-05 ENCOUNTER — Telehealth: Payer: Self-pay | Admitting: *Deleted

## 2015-07-05 NOTE — Telephone Encounter (Signed)
When is "stop date" for Ancef?  July 16, 2015 per [facility].  Pt "No Showed" 06/25/25 for [facility] appt.  RN will notify K. Marley to contact pt to schedule [facility] appt.  [facility] had two contact numbers for the pt.   1)  [phone] and 2)  Shelia [employer] at [phone]

## 2015-07-06 NOTE — Telephone Encounter (Signed)
Yes, March 6th.  Can then pull the picc line.

## 2015-07-11 NOTE — Telephone Encounter (Signed)
Per Dr. Luciana Axe confirmed stop date 07/16/15 and pull PICC.  Order read back by Eunice Blase [facility].

## 2015-07-26 ENCOUNTER — Encounter: Payer: Self-pay | Admitting: Internal Medicine

## 2015-08-01 ENCOUNTER — Inpatient Hospital Stay: Admitting: Infectious Diseases

## 2015-09-10 ENCOUNTER — Encounter: Payer: Self-pay | Admitting: Internal Medicine

## 2018-05-10 ENCOUNTER — Emergency Department (HOSPITAL_COMMUNITY): Payer: Medicaid Other

## 2018-05-10 ENCOUNTER — Inpatient Hospital Stay (HOSPITAL_COMMUNITY)
Admission: EM | Admit: 2018-05-10 | Discharge: 2018-05-15 | DRG: 896 | Disposition: A | Discharge status: Home / Self Care | Payer: Medicaid Other | Attending: Internal Medicine | Admitting: Internal Medicine

## 2018-05-10 ENCOUNTER — Encounter (HOSPITAL_COMMUNITY): Payer: Self-pay

## 2018-05-10 DIAGNOSIS — I10 Essential (primary) hypertension: Secondary | ICD-10-CM | POA: Diagnosis present

## 2018-05-10 DIAGNOSIS — E876 Hypokalemia: Secondary | ICD-10-CM | POA: Diagnosis present

## 2018-05-10 DIAGNOSIS — R509 Fever, unspecified: Secondary | ICD-10-CM

## 2018-05-10 DIAGNOSIS — J45909 Unspecified asthma, uncomplicated: Secondary | ICD-10-CM | POA: Diagnosis present

## 2018-05-10 DIAGNOSIS — R4182 Altered mental status, unspecified: Secondary | ICD-10-CM | POA: Diagnosis not present

## 2018-05-10 DIAGNOSIS — F1121 Opioid dependence, in remission: Secondary | ICD-10-CM

## 2018-05-10 DIAGNOSIS — E874 Mixed disorder of acid-base balance: Secondary | ICD-10-CM | POA: Diagnosis present

## 2018-05-10 DIAGNOSIS — Z888 Allergy status to other drugs, medicaments and biological substances status: Secondary | ICD-10-CM

## 2018-05-10 DIAGNOSIS — F1120 Opioid dependence, uncomplicated: Secondary | ICD-10-CM

## 2018-05-10 DIAGNOSIS — F1123 Opioid dependence with withdrawal: Secondary | ICD-10-CM | POA: Diagnosis present

## 2018-05-10 DIAGNOSIS — F17210 Nicotine dependence, cigarettes, uncomplicated: Secondary | ICD-10-CM | POA: Diagnosis present

## 2018-05-10 DIAGNOSIS — R112 Nausea with vomiting, unspecified: Secondary | ICD-10-CM

## 2018-05-10 DIAGNOSIS — R1013 Epigastric pain: Secondary | ICD-10-CM

## 2018-05-10 DIAGNOSIS — I739 Peripheral vascular disease, unspecified: Secondary | ICD-10-CM | POA: Diagnosis present

## 2018-05-10 DIAGNOSIS — Z7982 Long term (current) use of aspirin: Secondary | ICD-10-CM

## 2018-05-10 DIAGNOSIS — F1410 Cocaine abuse, uncomplicated: Secondary | ICD-10-CM | POA: Diagnosis present

## 2018-05-10 DIAGNOSIS — Z79899 Other long term (current) drug therapy: Secondary | ICD-10-CM

## 2018-05-10 DIAGNOSIS — G92 Toxic encephalopathy: Secondary | ICD-10-CM | POA: Diagnosis present

## 2018-05-10 DIAGNOSIS — R000 Tachycardia, unspecified: Secondary | ICD-10-CM

## 2018-05-10 DIAGNOSIS — K219 Gastro-esophageal reflux disease without esophagitis: Secondary | ICD-10-CM | POA: Diagnosis present

## 2018-05-10 DIAGNOSIS — F1114 Opioid abuse with opioid-induced mood disorder: Secondary | ICD-10-CM

## 2018-05-10 DIAGNOSIS — R739 Hyperglycemia, unspecified: Secondary | ICD-10-CM | POA: Diagnosis present

## 2018-05-10 DIAGNOSIS — D72820 Lymphocytosis (symptomatic): Secondary | ICD-10-CM

## 2018-05-10 DIAGNOSIS — G8929 Other chronic pain: Secondary | ICD-10-CM

## 2018-05-10 DIAGNOSIS — G9340 Encephalopathy, unspecified: Secondary | ICD-10-CM | POA: Diagnosis present

## 2018-05-10 DIAGNOSIS — E873 Alkalosis: Secondary | ICD-10-CM

## 2018-05-10 LAB — DIFFERENTIAL
Abs Immature Granulocytes: 0.04 10*3/uL (ref 0.00–0.07)
Basophils Absolute: 0.1 10*3/uL (ref 0.0–0.1)
Basophils Relative: 0 %
Eosinophils Absolute: 0 10*3/uL (ref 0.0–0.5)
Eosinophils Relative: 0 %
Immature Granulocytes: 0 %
Lymphocytes Relative: 12 %
Lymphs Abs: 1.8 10*3/uL (ref 0.7–4.0)
Monocytes Absolute: 0.9 10*3/uL (ref 0.1–1.0)
Monocytes Relative: 6 %
Neutro Abs: 12.7 10*3/uL — ABNORMAL HIGH (ref 1.7–7.7)
Neutrophils Relative %: 82 %

## 2018-05-10 LAB — URINALYSIS, ROUTINE W REFLEX MICROSCOPIC
Bacteria, UA: NONE SEEN
Bilirubin Urine: NEGATIVE
Glucose, UA: NEGATIVE mg/dL
Hgb urine dipstick: NEGATIVE
Ketones, ur: 80 mg/dL — AB
Leukocytes, UA: NEGATIVE
Nitrite: NEGATIVE
Protein, ur: 100 mg/dL — AB
Specific Gravity, Urine: 1.021 (ref 1.005–1.030)
pH: 9 — ABNORMAL HIGH (ref 5.0–8.0)

## 2018-05-10 LAB — CBC
HCT: 51.2 % (ref 39.0–52.0)
Hemoglobin: 17.8 g/dL — ABNORMAL HIGH (ref 13.0–17.0)
MCH: 29.3 pg (ref 26.0–34.0)
MCHC: 34.8 g/dL (ref 30.0–36.0)
MCV: 84.3 fL (ref 80.0–100.0)
Platelets: 483 10*3/uL — ABNORMAL HIGH (ref 150–400)
RBC: 6.07 MIL/uL — ABNORMAL HIGH (ref 4.22–5.81)
RDW: 12.5 % (ref 11.5–15.5)
WBC: 15.9 10*3/uL — ABNORMAL HIGH (ref 4.0–10.5)
nRBC: 0 % (ref 0.0–0.2)

## 2018-05-10 LAB — CSF CELL COUNT WITH DIFFERENTIAL
RBC Count, CSF: 11 /mm3 — ABNORMAL HIGH
RBC Count, CSF: 7 /mm3 — ABNORMAL HIGH
Tube #: 1
Tube #: 4
WBC, CSF: 3 /mm3 (ref 0–5)
WBC, CSF: 3 /mm3 (ref 0–5)

## 2018-05-10 LAB — RESPIRATORY PANEL BY PCR

## 2018-05-10 LAB — COMPREHENSIVE METABOLIC PANEL
ALT: 29 U/L (ref 0–44)
AST: 29 U/L (ref 15–41)
Albumin: 4.3 g/dL (ref 3.5–5.0)
Alkaline Phosphatase: 102 U/L (ref 38–126)
Anion gap: 19 — ABNORMAL HIGH (ref 5–15)
BUN: 10 mg/dL (ref 6–20)
CO2: 22 mmol/L (ref 22–32)
Calcium: 10.3 mg/dL (ref 8.9–10.3)
Chloride: 98 mmol/L (ref 98–111)
Creatinine, Ser: 1.09 mg/dL (ref 0.61–1.24)
GFR calc Af Amer: >60 mL/min (ref >60)
GFR calc non Af Amer: >60 mL/min (ref >60)
Glucose, Bld: 161 mg/dL — ABNORMAL HIGH (ref 70–99)
Potassium: 3.7 mmol/L (ref 3.5–5.1)
Sodium: 139 mmol/L (ref 135–145)
Total Bilirubin: 1.8 mg/dL — ABNORMAL HIGH (ref 0.3–1.2)
Total Protein: 8.1 g/dL (ref 6.5–8.1)

## 2018-05-10 LAB — RAPID URINE DRUG SCREEN, HOSP PERFORMED
Amphetamines: NOT DETECTED
Barbiturates: NOT DETECTED
Benzodiazepines: NOT DETECTED
Cocaine: POSITIVE — AB
Opiates: POSITIVE — AB
Tetrahydrocannabinol: NOT DETECTED

## 2018-05-10 LAB — I-STAT ARTERIAL BLOOD GAS, ED
Acid-Base Excess: 8 mmol/L — ABNORMAL HIGH (ref 0.0–2.0)
Bicarbonate: 27.1 mmol/L (ref 20.0–28.0)
O2 Saturation: 96 %
Patient temperature: 100.5
TCO2: 28 mmol/L (ref 22–32)
pCO2 arterial: 24.4 mmHg — ABNORMAL LOW (ref 32.0–48.0)
pH, Arterial: 7.655 (ref 7.350–7.450)
pO2, Arterial: 66 mmHg — ABNORMAL LOW (ref 83.0–108.0)

## 2018-05-10 LAB — MAGNESIUM: Magnesium: 1.6 mg/dL — ABNORMAL LOW (ref 1.7–2.4)

## 2018-05-10 LAB — LACTIC ACID, PLASMA
Lactic Acid, Venous: 3.9 mmol/L (ref 0.5–1.9)
Lactic Acid, Venous: 1.7 mmol/L (ref 0.5–1.9)

## 2018-05-10 LAB — CRYPTOCOCCAL ANTIGEN, CSF: Crypto Ag: NEGATIVE

## 2018-05-10 LAB — ETHANOL: Alcohol, Ethyl (B): <10 mg/dL (ref <10)

## 2018-05-10 LAB — I-STAT VENOUS BLOOD GAS, ED
Acid-Base Excess: 8 mmol/L — ABNORMAL HIGH (ref 0.0–2.0)
Bicarbonate: 23.9 mmol/L (ref 20.0–28.0)
O2 Saturation: 76 %
TCO2: 24 mmol/L (ref 22–32)
pCO2, Ven: 16.5 mmHg — CL (ref 44.0–60.0)
pH, Ven: 7.768 (ref 7.250–7.430)
pO2, Ven: 27 mmHg — CL (ref 32.0–45.0)

## 2018-05-10 LAB — TSH: TSH: 0.283 u[IU]/mL — ABNORMAL LOW (ref 0.350–4.500)

## 2018-05-10 LAB — GLUCOSE, CAPILLARY: Glucose-Capillary: 143 mg/dL — ABNORMAL HIGH (ref 70–99)

## 2018-05-10 LAB — TROPONIN I: Troponin I: <0.03 ng/mL (ref <0.03)

## 2018-05-10 LAB — ACETAMINOPHEN LEVEL: Acetaminophen (Tylenol), Serum: <10 ug/mL — ABNORMAL LOW (ref 10–30)

## 2018-05-10 LAB — T4, FREE: Free T4: 1.18 ng/dL (ref 0.82–1.77)

## 2018-05-10 LAB — AMMONIA: Ammonia: 37 umol/L — ABNORMAL HIGH (ref 9–35)

## 2018-05-10 LAB — SALICYLATE LEVEL: Salicylate Lvl: <7 mg/dL (ref 2.8–30.0)

## 2018-05-10 LAB — PROTEIN, CSF: Total  Protein, CSF: 35 mg/dL (ref 15–45)

## 2018-05-10 LAB — GLUCOSE, CSF: Glucose, CSF: 78 mg/dL — ABNORMAL HIGH (ref 40–70)

## 2018-05-10 LAB — LIPASE, BLOOD: Lipase: 25 U/L (ref 11–51)

## 2018-05-10 MED ORDER — HEPARIN SODIUM (PORCINE) 5000 UNIT/ML IJ SOLN
5000.0000 [IU] | Freq: Three times a day (TID) | INTRAMUSCULAR | Status: DC
  Administered 2018-05-10 – 2018-05-14 (×11): 5000 [IU] via SUBCUTANEOUS
  Filled 2018-05-10 (×9): qty 1

## 2018-05-10 MED ORDER — ORAL CARE MOUTH RINSE
15.0000 mL | Freq: Two times a day (BID) | OROMUCOSAL | Status: DC
  Administered 2018-05-11 – 2018-05-12 (×3): 15 mL via OROMUCOSAL

## 2018-05-10 MED ORDER — VANCOMYCIN HCL 10 G IV SOLR
1500.0000 mg | Freq: Once | INTRAVENOUS | Status: AC
  Administered 2018-05-10: 1500 mg via INTRAVENOUS
  Filled 2018-05-10: qty 1500

## 2018-05-10 MED ORDER — LACTATED RINGERS IV BOLUS
1000.0000 mL | Freq: Once | INTRAVENOUS | Status: AC
  Administered 2018-05-10: 1000 mL via INTRAVENOUS

## 2018-05-10 MED ORDER — DEXTROSE 5 % IV SOLN
10.0000 mg/kg | Freq: Once | INTRAVENOUS | Status: AC
  Administered 2018-05-11: 815 mg via INTRAVENOUS
  Filled 2018-05-10 (×2): qty 16.3

## 2018-05-10 MED ORDER — MAGNESIUM SULFATE 2 GM/50ML IV SOLN
2.0000 g | Freq: Once | INTRAVENOUS | Status: AC
  Administered 2018-05-10: 2 g via INTRAVENOUS
  Filled 2018-05-10: qty 50

## 2018-05-10 MED ORDER — IOHEXOL 300 MG/ML  SOLN
100.0000 mL | Freq: Once | INTRAMUSCULAR | Status: AC | PRN
  Administered 2018-05-10: 100 mL via INTRAVENOUS

## 2018-05-10 MED ORDER — CLONIDINE HCL 0.1 MG PO TABS: 0.1000 mg | ORAL_TABLET | Freq: Once | ORAL | Status: DC

## 2018-05-10 MED ORDER — CHLORHEXIDINE GLUCONATE 0.12 % MT SOLN: 15.0000 mL | Freq: Two times a day (BID) | OROMUCOSAL | Status: DC

## 2018-05-10 MED ORDER — SODIUM CHLORIDE 0.9 % IV SOLN
2.0000 g | Freq: Two times a day (BID) | INTRAVENOUS | Status: DC
  Administered 2018-05-10: 2 g via INTRAVENOUS
  Filled 2018-05-10 (×2): qty 20

## 2018-05-10 MED ORDER — LORAZEPAM 2 MG/ML IJ SOLN
1.0000 mg | Freq: Once | INTRAMUSCULAR | Status: AC
  Administered 2018-05-10: 1 mg via INTRAVENOUS
  Filled 2018-05-10: qty 1

## 2018-05-10 MED ORDER — ACETAMINOPHEN 500 MG PO TABS
1000.0000 mg | ORAL_TABLET | Freq: Once | ORAL | Status: AC
  Administered 2018-05-10: 1000 mg via ORAL
  Filled 2018-05-10: qty 2

## 2018-05-10 MED ORDER — VANCOMYCIN HCL IN DEXTROSE 1-5 GM/200ML-% IV SOLN
1000.0000 mg | Freq: Two times a day (BID) | INTRAVENOUS | Status: DC
  Filled 2018-05-10: qty 200

## 2018-05-10 MED ORDER — DEXTROSE 5 % IV SOLN
10.0000 mg/kg | Freq: Three times a day (TID) | INTRAVENOUS | Status: DC
  Filled 2018-05-10: qty 16.3

## 2018-05-10 MED ORDER — THIAMINE HCL 100 MG/ML IJ SOLN
100.0000 mg | Freq: Every day | INTRAMUSCULAR | Status: DC
  Administered 2018-05-11 – 2018-05-12 (×2): 100 mg via INTRAVENOUS
  Filled 2018-05-10 (×2): qty 2

## 2018-05-10 MED ORDER — DEXAMETHASONE SODIUM PHOSPHATE 10 MG/ML IJ SOLN
10.0000 mg | Freq: Once | INTRAMUSCULAR | Status: AC
  Administered 2018-05-10: 10 mg via INTRAVENOUS
  Filled 2018-05-10: qty 1

## 2018-05-10 MED ORDER — LIDOCAINE-EPINEPHRINE (PF) 2 %-1:200000 IJ SOLN
20.0000 mL | Freq: Once | INTRAMUSCULAR | Status: AC
  Administered 2018-05-10: 20 mL via INTRADERMAL
  Filled 2018-05-10: qty 20

## 2018-05-10 MED ORDER — VANCOMYCIN HCL IN DEXTROSE 1-5 GM/200ML-% IV SOLN: 1000.0000 mg | Freq: Once | INTRAVENOUS | Status: DC

## 2018-05-10 MED ORDER — FOLIC ACID 5 MG/ML IJ SOLN
1.0000 mg | Freq: Every day | INTRAMUSCULAR | Status: DC
  Administered 2018-05-11 – 2018-05-12 (×2): 1 mg via INTRAVENOUS
  Filled 2018-05-10 (×2): qty 0.2

## 2018-05-10 MED ORDER — DEXTROSE 5 % IV BOLUS
1000.0000 mL | Freq: Once | INTRAVENOUS | Status: AC
  Administered 2018-05-10: 1000 mL via INTRAVENOUS

## 2018-05-10 MED ORDER — LORAZEPAM 2 MG/ML IJ SOLN
1.0000 mg | Freq: Once | INTRAMUSCULAR | Status: AC
  Administered 2018-05-10: 1 mg via INTRAVENOUS

## 2018-05-10 MED ORDER — ONDANSETRON HCL 4 MG/2ML IJ SOLN
4.0000 mg | Freq: Once | INTRAMUSCULAR | Status: AC
  Administered 2018-05-10: 4 mg via INTRAVENOUS
  Filled 2018-05-10: qty 2

## 2018-05-10 MED ORDER — SODIUM CHLORIDE 0.9 % IV SOLN
2.0000 g | INTRAVENOUS | Status: DC
  Administered 2018-05-10: 2 g via INTRAVENOUS
  Filled 2018-05-10 (×4): qty 2000

## 2018-05-10 MED ORDER — SODIUM CHLORIDE 0.9 % IV SOLN
INTRAVENOUS | Status: DC
  Administered 2018-05-10: 23:00:00 via INTRAVENOUS

## 2018-05-10 MED ORDER — CLONIDINE HCL 0.1 MG PO TABS
0.1000 mg | ORAL_TABLET | Freq: Once | ORAL | Status: AC
  Administered 2018-05-10: 0.1 mg via ORAL
  Filled 2018-05-10: qty 1

## 2018-05-10 MED ORDER — FAMOTIDINE IN NACL 20-0.9 MG/50ML-% IV SOLN
20.0000 mg | Freq: Two times a day (BID) | INTRAVENOUS | Status: DC
  Administered 2018-05-11: 20 mg via INTRAVENOUS
  Filled 2018-05-10 (×2): qty 50

## 2018-05-10 NOTE — Progress Notes (Signed)
ABG results given to RN who is transporting patient to admitting floor. RN paged CCM.

## 2018-05-10 NOTE — ED Triage Notes (Addendum)
Pt from home via ems; per pt's wife, pt has not had opiates in 2 days; multiple episodes of v/d that started at 0600 this am; hx heroine use per wife; pt c/o heaviness to chest, sob, and abdominal pain; slow to respond to questions; pt states that he snorts fentanyl; last used 2 days ago  1710/100 Hr 104 rr 28 99% ra cbg 145

## 2018-05-10 NOTE — ED Notes (Signed)
Patient transported to CT 
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
## 2018-05-10 NOTE — Consult Note (Signed)
NEURO HOSPITALIST CONSULT NOTE   Requesting physician: Dr. Rosalia Hammers  Reason for Consult: AMS in the setting of fentanyl withdrawal  History obtained from:    Chart    HPI:                                                                                                                                          Vincent Clarke is an 52 y.o. male who presented to the ED from home via EMS for assessment of multiple episodes of N/V starting at 0600 on Monday, in the context of having stopped using fentanyl 2 days prior. During one of his states of semiconscious arousal in the ED, the patient mumbles that he has been "trying to get some" when asked whether he quit fentanyl intentionally or had run out. On arrival, the patient had complained of chest heaviness, SOB and abdominal pain. Later endorsed to the EDP that he was having diffuse myalgias and tremors as well. He denied focal weakness, numbness, vision changes or paresthesias. He was slow to respond to questions but was able to state that he snorts fentanyl and last use was 2 days previously. He was tachypneic, tachycardic and hypertensive on arrival to the ED. He has apparently been abusing fentanyl for approximately the last year and a half. Also endorsed heroin use. He denied other drugs of abuse but was cocaine-positive. He also denied daily EtOH intake. He was started on vancomycin and rocephin in the ED. LP was performed with normal WBC and protein, colorless and clear, with slightly elevated glucose. CT head showed no acute intracranial abnormality.   Past Medical History:  Diagnosis Date  . Arthritis    hand.  left leg  . Asthma   . GERD (gastroesophageal reflux disease)   . Hypertension   . Peripheral vascular disease (HCC) 12/2014   PV Bypass    Past Surgical History:  Procedure Laterality Date  . APPENDECTOMY  1970's  . BYPASS GRAFT POPLITEAL TO TIBIAL Left 12/18/2014   Procedure: Bypass Graft left popliteal to left  Dorsalis-pedis.;  Surgeon: Sherren Kerns, MD;  Location: [facility] OR;  Service: Vascular;  Laterality: Left;  . CHOLECYSTECTOMY N/A 03/20/2014   Procedure: LAPAROSCOPIC CHOLECYSTECTOMY;  Surgeon: Atilano Ina, MD;  Location[facility] OR;  Service: General;  Laterality: N/A;  . EXTERNAL FIXATION LEG Left 12/18/2014   Procedure: EXTERNAL FIXATION LEG;  Surgeon: Sheral Apley, MD;  Location: [facility] OR;  Service: Orthopedics;  Laterality: Left;  . EXTERNAL FIXATION REMOVAL Left 12/26/2014   Procedure: REMOVAL EXTERNAL FIXATION LEG;  Surgeon: Sheral Apley, MD;  Location: [facility] OR;  Service: Orthopedics;  Laterality: Left;  . FEMUR FRACTURE SURGERY Left ~ 1980   "had pin in it; was in traction"  . HARDWARE REMOVAL  Left 06/05/2015   Procedure: REMOVAL Left Ankle Hardware and Tibial Nail;  Surgeon: Sheral Apley, MD;  Location: [facility] OR;  Service: Orthopedics;  Laterality: Left;  . I&D EXTREMITY Left 06/05/2015   Procedure: 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;  Surgeon: Sheral Apley, MD;  [facility];  Service: Orthopedics;  Laterality: Left;  . LAPAROSCOPIC CHOLECYSTECTOMY  03/20/2014  . MYRINGOTOMY Bilateral   . ORIF FIBULA FRACTURE Left 12/26/2014   Procedure: OPEN REDUCTION INTERNAL FIXATION (ORIF) FIBULA FRACTURE;  Surgeon: Sheral Apley, MD;  Location: [facility] OR;  Service: Orthopedics;  Laterality: Left;  . TIBIA IM NAIL INSERTION Left 12/26/2014   Procedure: INTRAMEDULLARY (IM) NAIL TIBIAL;  Surgeon: Sheral Apley, MD;  Location: [facility] OR;  Service: Orthopedics;  Laterality: Left;  biomet ex fix removal and stryker tibial nail  . TONSILLECTOMY  1970's   "?adenoids"    No family history on file.            Social History:  reports that he has been smoking cigarettes. He has a 33.00 pack-year smoking history. He has never used smokeless tobacco. He reports current drug use. He reports that he does not drink alcohol.  Allergies  Allergen Reactions  . Flexeril [Cyclobenzaprine]  Swelling    Facial swelling  . Tramadol Other (See Comments)    Unknown reaction    MEDICATIONS:                                                                                                                     Prior to Admission:  Medications Prior to Admission  Medication Sig Dispense Refill Last Dose  . famotidine (PEPCID) 20 MG tablet Take 20 mg by mouth See admin instructions. Take one tablet (20 mg) by mouth every morning, take one tablet (20 mg) at night as needed for acid reflux   05/07/2018  . ibuprofen (ADVIL,MOTRIN) 200 MG tablet Take 600 mg by mouth every 6 (six) hours as needed (pain).   12/26 or 12/27  . polyvinyl alcohol (ARTIFICIAL TEARS) 1.4 % ophthalmic solution Place 1 drop into the left eye daily as needed for dry eyes (irritation).   few days ago  . aspirin 325 MG tablet Take 1 tablet (325 mg total) by mouth daily. (Patient not taking: Reported on 05/10/2018) 30 tablet 0 Not Taking at Unknown time  . docusate sodium (COLACE) 100 MG capsule Take 1 capsule (100 mg total) by mouth 2 (two) times daily. (Patient not taking: Reported on 05/10/2018) 10 capsule 0 Not Taking at Unknown time  . methocarbamol (ROBAXIN) 500 MG tablet Take 1 tablet (500 mg total) by mouth 4 (four) times daily. (Patient not taking: Reported on 05/10/2018) 60 tablet 0 Not Taking at Unknown time  . ondansetron (ZOFRAN) 4 MG tablet Take 1 tablet (4 mg total) by mouth every 8 (eight) hours as needed for nausea or vomiting. (Patient not taking: Reported on 05/10/2018) 20 tablet 0 Not Taking at Unknown time  . oxyCODONE (  ROXICODONE) 5 MG immediate release tablet Take 1-2 tablets (5-10 mg total) by mouth every 4 (four) hours as needed for severe pain. (Patient not taking: Reported on 05/10/2018) 90 tablet 0 Not Taking at Unknown time   Scheduled: . cloNIDine  0.1 mg Oral Once  . folic acid  1 mg Intravenous Daily  . heparin  5,000 Units Subcutaneous Q8H  . LORazepam  1 mg Intravenous Once  . mouth rinse  15  mL Mouth Rinse q12n4p  . thiamine injection  100 mg Intravenous Daily   Continuous: . sodium chloride 75 mL/hr at 05/11/18 0200  . sodium chloride    . famotidine (PEPCID) IV Stopped (05/11/18 0032)  . potassium chloride 10 mEq (05/11/18 0309)  . potassium PHOSPHATE IVPB (in mmol) 30 mmol (05/11/18 0209)     ROS:                                                                                                                                       As per HPI. Patient unable to provide ROS at time of Neurology evaluation due to fluctuating mental status/delirium.    Blood pressure (!) 164/116, pulse (!) 107, temperature (!) 101 F (38.3 C), temperature source Rectal, resp. rate (!) 23, height 5\' 5"  (1.651 m), weight 81.6 kg, SpO2 94 %.   General Examination:                                                                                                       Physical Exam  HEENT-  [place]/AT. No nuchal rigidity.  Lungs: Respirations unlabored Ext: No edema.   Neurological Examination Mental Status: Initially on entering the room, patient was OOB wandering the room with his gown off. RN placed patient back in bed, after which he exhibited somnolence and was difficult to arouse. When arouse he mumbled two short phrases pertinent to the situation (trying to get more fentanyl and feeling cold). He did not follow commands. He was curled up in the fetal position with the blanket pulled over him, shivering slightly.  Cranial Nerves: II: Pupils 4 mm and reactive to 2 mm on brief assessment (clamps eyes shut with stimulation). Unable to test visual fields.   III,IV, VI: Closes eyes tightly. Non-cooperative to testing of EOM. No forced eye deviation seen when eyes briefly open.  V,VII: Face grossly symmetric. Clamps eyelids shut when examiner attempts to open.  VIII: hearing intact to voice IX,X: Unable to assess XI: No asymmetry of head  position when patient was briefly seen standing.  XII:  Noncooperative Motor: Moves all 4 extremities equally to stimulation. Positions himself in the bed in a fetal position with covers drawn up over him. Sensory: Reacts to gross tactile and pressure stimuli x 4. Unable to test temp and FT.  Deep Tendon Reflexes: No pathological reflexes noted on limited exam Plantars: Right: downgoing   Left: downgoing Cerebellar: Non-cooperative.  Gait: Observed to have an unsteady, wide based, shuffling and ataxic gait pattern when he was seen standing and walking near his bed prior to Neurological exam.    Lab Results: Basic Metabolic Panel: Recent Labs  Lab 05/10/18 1420 05/10/18 1511  NA 139  --   K 3.7  --   CL 98  --   CO2 22  --   GLUCOSE 161*  --   BUN 10  --   CREATININE 1.09  --   CALCIUM 10.3  --   MG  --  1.6*    CBC: Recent Labs  Lab 05/10/18 1420 05/10/18 1614  WBC 15.9*  --   NEUTROABS  --  12.7*  HGB 17.8*  --   HCT 51.2  --   MCV 84.3  --   PLT 483*  --     Cardiac Enzymes: Recent Labs  Lab 05/10/18 1614  TROPONINI <0.03    Lipid Panel: No results for input(s): CHOL, TRIG, HDL, CHOLHDL, VLDL, LDLCALC in the last 168 hours.  Imaging: Dg Chest 1 View  Result Date: 05/10/2018 CLINICAL DATA:  Tachycardia and shortness of breath. EXAM: CHEST  1 VIEW COMPARISON:  Chest x-ray dated December 04, 2013. FINDINGS: The heart size and mediastinal contours are within normal limits. Normal pulmonary vascularity. No focal consolidation, pleural effusion, or pneumothorax. No acute osseous abnormality. IMPRESSION: No active disease. Electronically Signed   By: Obie Dredge M.D.   On: 05/10/2018 16:19   Ct Head Wo Contrast  Result Date: 05/10/2018 CLINICAL DATA:  Altered level of consciousness. EXAM: CT HEAD WITHOUT CONTRAST TECHNIQUE: Contiguous axial images were obtained from the base of the skull through the vertex without intravenous contrast. COMPARISON:  07/17/2011 FINDINGS: Brain: No acute intracranial abnormality.  Specifically, no hemorrhage, hydrocephalus, mass lesion, acute infarction, or significant intracranial injury. Vascular: No hyperdense vessel or unexpected calcification. Skull: No acute calvarial abnormality. Sinuses/Orbits: Mucosal thickening in the ethmoid air cells. No acute finding. Other: None IMPRESSION: No acute intracranial abnormality. Chronic ethmoid sinusitis. Electronically Signed   By: Charlett Nose M.D.   On: 05/10/2018 18:44   Ct Abdomen Pelvis W Contrast  Result Date: 05/10/2018 CLINICAL DATA:  Abdominal pain EXAM: CT ABDOMEN AND PELVIS WITH CONTRAST TECHNIQUE: Multidetector CT imaging of the abdomen and pelvis was performed using the standard protocol following bolus administration of intravenous contrast. CONTRAST:  OMNIPAQUE IOHEXOL 300 MG/ML  SOLN COMPARISON:  None. FINDINGS: Lower chest: Lung bases are clear. No effusions. Heart is normal size. Hepatobiliary: No focal liver abnormality is seen. Status post cholecystectomy. No biliary dilatation. Pancreas: No focal abnormality or ductal dilatation. Spleen: No focal abnormality.  Normal size. Adrenals/Urinary Tract: No adrenal abnormality. No focal renal abnormality. No stones or hydronephrosis. Urinary bladder is unremarkable. Stomach/Bowel: Stomach, large and small bowel grossly unremarkable. Vascular/Lymphatic: Aorta and iliac vessels heavily calcified. No evidence of aneurysm or adenopathy. Reproductive: Prominent prostate with calcifications. Other: No free fluid or free air. Musculoskeletal: No acute bony abnormality. IMPRESSION: No acute findings in the abdomen or pelvis. Heavily calcified aorta. Mildly prominent prostate. Electronically  Signed   By: Charlett NoseKevin  Dover M.D.   On: 05/10/2018 18:48    Assessment: 52 year old male with opiate withdrawal symptoms following a 2 day hiatus in what is most likely chronic fentanyl and heroin abuse 1. Exam is consistent with a delirium. Also with ataxia when seen ambulating. Delirium and  ataxia raise the question of possible other substance intoxication (e.g. other intoxicants such as Kratom and dextromethorphan are documented in the literature as having been used as low-cost and "legal" attempts at substituting for opiates) versus EtOH or benzodiazepine withdrawal 2. CT head negative for acute abnormality 3. No seizure activity reported  4. LP was not consistent with a meningitis.   Recommendations: 1. MRI brain with and without contrast 2. Close observation and supportive care for opiate withdrawal 3. CIWA protocol  Electronically signed: Dr. Caryl PinaEric Denna Fryberger 05/10/2018, 9:19 PM
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
## 2018-05-10 NOTE — Progress Notes (Signed)
RT at bedside to obtain ABG.  Patient out of bed using urinal. RN to change sheets and clean floor. RT will follow up.

## 2018-05-10 NOTE — Progress Notes (Addendum)
Pharmacy Antibiotic Note  Vincent Clarke is a 52 y.o. male admitted on 05/10/2018 with myalgias, vomiting, diarrhea, and tremors.  He has a history of Fentanyl and heroine use.  Pharmacy has been consulted for vancomycin dosing for rule out meningitis.  He is also started on Rocephin  SCr 1.09, CrCL 77 ml/min, Tmax 101, WBC 15.9.   Plan: Vanc 1500mg  IV x 1, then 1gm IV Q12H for trough 15-20 mcg/mL CTX 2gm IV Q12H per MD Monitor renal fxn, clinical progress, vanc trough at C[facility] F/U with adding ampicillin   Height: 5\' 5"  (165.1 cm) Weight: 180 lb (81.6 kg) IBW/kg (Calculated) : 61.5  Temp (24hrs), Avg:99.8 F (37.7 C), Min:98.5 F (36.9 C), Max:101 F (38.3 C)  Recent Labs  Lab 05/10/18 1420  WBC 15.9*  CREATININE 1.09    Estimated Creatinine Clearance: 77 mL/min (by C-G formula based on SCr of 1.09 mg/dL).    Allergies  Allergen Reactions  . Flexeril [Cyclobenzaprine] Swelling    Facial swelling  . Tramadol Other (See Comments)    unknown    Vanc 12/30 >> CTX 12/30 >>  12/30 resp panel PCR -  12/30 CSF -  12/30 BCx -  12/30 UCx -    Whitlee Sluder D. Laney Potashang, PharmD, BCPS, BCCCP 05/10/2018, 4:13 PM    ======================  Addendum: Ampicillin added Pharmacy consulted to start acyclovir  Plan: Acyclovir 810mg  IV Q8H (~10 mg/kg using TBW for BMI < 30) F/U HSV, LOT vs switching agents vs change to PO given drug shortage   Quentez Lober D. Laney Potashang, PharmD, BCPS, BCCCP 05/10/2018, 9:47 PM

## 2018-05-10 NOTE — ED Notes (Signed)
Spoke w/ lab regarding magnesium add on

## 2018-05-10 NOTE — ED Notes (Signed)
Pt projectile vomiting; EDP notified

## 2018-05-11 LAB — COMPREHENSIVE METABOLIC PANEL
ALT: 25 U/L (ref 0–44)
AST: 38 U/L (ref 15–41)
Albumin: 3.5 g/dL (ref 3.5–5.0)
Alkaline Phosphatase: 76 U/L (ref 38–126)
Anion gap: 12 (ref 5–15)
BUN: 9 mg/dL (ref 6–20)
CO2: 24 mmol/L (ref 22–32)
Calcium: 8.5 mg/dL — ABNORMAL LOW (ref 8.9–10.3)
Chloride: 102 mmol/L (ref 98–111)
Creatinine, Ser: 0.86 mg/dL (ref 0.61–1.24)
GFR calc Af Amer: >60 mL/min (ref >60)
GFR calc non Af Amer: >60 mL/min (ref >60)
Glucose, Bld: 125 mg/dL — ABNORMAL HIGH (ref 70–99)
Potassium: 2.8 mmol/L — ABNORMAL LOW (ref 3.5–5.1)
Sodium: 138 mmol/L (ref 135–145)
Total Bilirubin: 0.9 mg/dL (ref 0.3–1.2)
Total Protein: 6.4 g/dL — ABNORMAL LOW (ref 6.5–8.1)

## 2018-05-11 LAB — CBC
HCT: 47.4 % (ref 39.0–52.0)
Hemoglobin: 17.2 g/dL — ABNORMAL HIGH (ref 13.0–17.0)
MCH: 30 pg (ref 26.0–34.0)
MCHC: 36.3 g/dL — ABNORMAL HIGH (ref 30.0–36.0)
MCV: 82.6 fL (ref 80.0–100.0)
Platelets: 489 10*3/uL — ABNORMAL HIGH (ref 150–400)
RBC: 5.74 MIL/uL (ref 4.22–5.81)
RDW: 12.6 % (ref 11.5–15.5)
WBC: 19.9 10*3/uL — ABNORMAL HIGH (ref 4.0–10.5)
nRBC: 0 % (ref 0.0–0.2)

## 2018-05-11 LAB — PROCALCITONIN: Procalcitonin: <0.1 ng/mL

## 2018-05-11 LAB — BASIC METABOLIC PANEL
Anion gap: 16 — ABNORMAL HIGH (ref 5–15)
BUN: 7 mg/dL (ref 6–20)
CO2: 25 mmol/L (ref 22–32)
Calcium: 9.4 mg/dL (ref 8.9–10.3)
Chloride: 96 mmol/L — ABNORMAL LOW (ref 98–111)
Creatinine, Ser: 1.05 mg/dL (ref 0.61–1.24)
GFR calc Af Amer: >60 mL/min (ref >60)
GFR calc non Af Amer: >60 mL/min (ref >60)
Glucose, Bld: 176 mg/dL — ABNORMAL HIGH (ref 70–99)
Potassium: 2.4 mmol/L — CL (ref 3.5–5.1)
Sodium: 137 mmol/L (ref 135–145)

## 2018-05-11 LAB — MAGNESIUM: Magnesium: 2 mg/dL (ref 1.7–2.4)

## 2018-05-11 LAB — CBC WITH DIFFERENTIAL/PLATELET
Abs Immature Granulocytes: 0.2 10*3/uL — ABNORMAL HIGH (ref 0.00–0.07)
Basophils Absolute: 0 10*3/uL (ref 0.0–0.1)
Basophils Relative: 0 %
Eosinophils Absolute: 0.1 10*3/uL (ref 0.0–0.5)
Eosinophils Relative: 0 %
HCT: 44.6 % (ref 39.0–52.0)
Hemoglobin: 15.7 g/dL (ref 13.0–17.0)
Immature Granulocytes: 1 %
Lymphocytes Relative: 7 %
Lymphs Abs: 1.8 10*3/uL (ref 0.7–4.0)
MCH: 29.6 pg (ref 26.0–34.0)
MCHC: 35.2 g/dL (ref 30.0–36.0)
MCV: 84.2 fL (ref 80.0–100.0)
Monocytes Absolute: 2.8 10*3/uL — ABNORMAL HIGH (ref 0.1–1.0)
Monocytes Relative: 11 %
Neutro Abs: 19.9 10*3/uL — ABNORMAL HIGH (ref 1.7–7.7)
Neutrophils Relative %: 81 %
Platelets: 351 10*3/uL (ref 150–400)
RBC: 5.3 MIL/uL (ref 4.22–5.81)
RDW: 12.8 % (ref 11.5–15.5)
WBC: 24.8 10*3/uL — ABNORMAL HIGH (ref 4.0–10.5)
nRBC: 0 % (ref 0.0–0.2)

## 2018-05-11 LAB — URINE CULTURE: Culture: NO GROWTH

## 2018-05-11 LAB — MRSA PCR SCREENING: MRSA by PCR: NEGATIVE

## 2018-05-11 LAB — PHOSPHORUS: Phosphorus: 1.5 mg/dL — ABNORMAL LOW (ref 2.5–4.6)

## 2018-05-11 LAB — HIV ANTIBODY (ROUTINE TESTING W REFLEX): HIV Screen 4th Generation wRfx: NONREACTIVE

## 2018-05-11 MED ORDER — ONDANSETRON HCL 4 MG/2ML IJ SOLN
4.0000 mg | Freq: Three times a day (TID) | INTRAMUSCULAR | Status: DC | PRN
  Administered 2018-05-11: 4 mg via INTRAVENOUS
  Filled 2018-05-11: qty 2

## 2018-05-11 MED ORDER — LABETALOL HCL 5 MG/ML IV SOLN
10.0000 mg | INTRAVENOUS | Status: DC | PRN
  Administered 2018-05-11 – 2018-05-12 (×7): 10 mg via INTRAVENOUS
  Filled 2018-05-11 (×6): qty 4

## 2018-05-11 MED ORDER — SODIUM CHLORIDE 0.9 % IV SOLN: INTRAVENOUS | Status: DC | PRN

## 2018-05-11 MED ORDER — POTASSIUM PHOSPHATES 15 MMOLE/5ML IV SOLN
30.0000 mmol | Freq: Once | INTRAVENOUS | Status: AC
  Administered 2018-05-11: 30 mmol via INTRAVENOUS
  Filled 2018-05-11 (×2): qty 10

## 2018-05-11 MED ORDER — BUPRENORPHINE HCL-NALOXONE HCL 2-0.5 MG SL SUBL
2.0000 | SUBLINGUAL_TABLET | Freq: Two times a day (BID) | SUBLINGUAL | Status: AC
  Administered 2018-05-11 – 2018-05-14 (×7): 2 via SUBLINGUAL
  Filled 2018-05-11 (×7): qty 2

## 2018-05-11 MED ORDER — LORAZEPAM 2 MG/ML IJ SOLN
1.0000 mg | Freq: Once | INTRAMUSCULAR | Status: AC
  Administered 2018-05-11: 1 mg via INTRAVENOUS
  Filled 2018-05-11: qty 1

## 2018-05-11 MED ORDER — POTASSIUM CHLORIDE 10 MEQ/100ML IV SOLN
10.0000 meq | INTRAVENOUS | Status: AC
  Administered 2018-05-11 (×6): 10 meq via INTRAVENOUS
  Filled 2018-05-11 (×5): qty 100

## 2018-05-11 MED ORDER — POTASSIUM CHLORIDE 10 MEQ/100ML IV SOLN
10.0000 meq | INTRAVENOUS | Status: AC
  Administered 2018-05-11 (×4): 10 meq via INTRAVENOUS
  Filled 2018-05-11 (×4): qty 100

## 2018-05-11 MED ORDER — LORAZEPAM 2 MG/ML IJ SOLN
2.0000 mg | Freq: Once | INTRAMUSCULAR | Status: AC
  Administered 2018-05-11: 2 mg via INTRAVENOUS
  Filled 2018-05-11: qty 1

## 2018-05-11 NOTE — Progress Notes (Signed)
NAME:  Vincent Clarke, MRN:  [id number], DOB:  01/04/1966, LOS: 1 ADMISSION DATE:  05/10/2018, CONSULTATION DATE:  12/30 REFERRING MD:  Dr. Rosalia Hammersay, CHIEF COMPLAINT:  confusion   Brief History   52 y/o male admitted with confusion and concern for opiate withdrawal.    Past Medical History  Fentanyl/opiate abuse, asthma, hypertension, osteomyelitis  Significant Hospital Events   12/30 admission    Consults:  12/30 Neurology >   Procedures:    Significant Diagnostic Tests:  12/30 LP > fluid analysis with 3 WBC, RBC 7, glucose 78, protein normal, crypto antigen normal 12/30 CT head > NAICP  Micro Data:  12/30 CSF > no growth to date  Antimicrobials:  12/30 amp x1 12/30 ceftriaxone x1 12/30 vanc x1 12/30 acyclovir x1   Interim history/subjective:  Some agitation overnight Diarrhea Itching Intermittent confusion Mostly sleeping  Objective   Blood pressure (!) 167/96, pulse 90, temperature 99.1 F (37.3 C), temperature source Oral, resp. rate (!) 24, height 5\' 5"  (1.651 m), weight 72.2 kg, SpO2 95 %.        Intake/Output Summary (Last 24 hours) at 05/11/2018 0948 Last data filed at 05/11/2018 0900 Gross per 24 hour  Intake 4805.89 ml  Output 0 ml  Net 4805.89 ml   Filed Weights   05/10/18 1403 05/10/18 2250 05/11/18 0440  Weight: 81.6 kg 71.5 kg 72.2 kg    Examination:  General:  Resting in bed HENT: NCAT OP clear, mydriasis noted PULM: CTA B, slight tachypnea, normal effort CV: RRR, no mgr GI: BS+, soft, nontender MSK: normal bulk and tone Neuro: drowsy, will nod head to questions appropriately but otherwise not following commands or conversant, moves all four extremities well   Resolved Hospital Problem list     Assessment & Plan:  Acute encephalopathy > considering his diarrhea, mydriasis, itching and reported history of opiate use I strongly suspect this is just opiate withdrawal but whether or not there is another cause (from chronic fentanyl use,  other underlying organic brain problem) or acute withdrawal of another substance remains unclear.  Family says he does not drink alcohol.  > monitor carefully in ICU > plan to start suboxone (4mg /1mg ) if more awake and no clear evidence of another underlying cause of agitation > if severely agitated consider prn haldol > f/u MRI later in week, I don't think that adding more sedation to fascilitate MRI is necessary right now > f/u LP culture  Fever> presume narcotic withdrawal > monitor cultures  Hypertension > monitor here, if escalates consider getting MRI sooner to eval for PRES (doubt)    Best practice:  Diet: NPO Pain/Anxiety/Delirium protocol (if indicated): n/a VAP protocol (if indicated): n/a DVT prophylaxis: sub q heparin GI prophylaxis: n/a Glucose control: monitor Mobility: bed rest for now Code Status: full Family Communication: none bedside Disposition: remain in ICU  Labs   CBC: Recent Labs  Lab 05/10/18 1420 05/10/18 1614 05/10/18 2320  WBC 15.9*  --  19.9*  NEUTROABS  --  12.7*  --   HGB 17.8*  --  17.2*  HCT 51.2  --  47.4  MCV 84.3  --  82.6  PLT 483*  --  489*    Basic Metabolic Panel: Recent Labs  Lab 05/10/18 1420 05/10/18 1511 05/10/18 2320  NA 139  --  137  K 3.7  --  2.4*  CL 98  --  96*  CO2 22  --  25  GLUCOSE 161*  --  176*  BUN 10  --  7  CREATININE 1.09  --  1.05  CALCIUM 10.3  --  9.4  MG  --  1.6* 2.0  PHOS  --   --  1.5*   GFR: Estimated Creatinine Clearance: 70.8 mL/min (by C-G formula based on SCr of 1.05 mg/dL). Recent Labs  Lab 05/10/18 1420 05/10/18 1614 05/10/18 2112 05/10/18 2320  PROCALCITON  --   --   --  <0.10  WBC 15.9*  --   --  19.9*  LATICACIDVEN  --  3.9* 1.7  --     Liver Function Tests: Recent Labs  Lab 05/10/18 1420  AST 29  ALT 29  ALKPHOS 102  BILITOT 1.8*  PROT 8.1  ALBUMIN 4.3   Recent Labs  Lab 05/10/18 1420  LIPASE 25   Recent Labs  Lab 05/10/18 1614  AMMONIA 37*     ABG    Component Value Date/Time   PHART 7.655 (HH) 05/10/2018 2229   PCO2ART 24.4 (L) 05/10/2018 2229   PO2ART 66.0 (L) 05/10/2018 2229   HCO3 27.1 05/10/2018 2229   TCO2 28 05/10/2018 2229   O2SAT 96.0 05/10/2018 2229     Coagulation Profile: No results for input(s): INR, PROTIME in the last 168 hours.  Cardiac Enzymes: Recent Labs  Lab 05/10/18 1614  TROPONINI <0.03    HbA1C: No results found for: HGBA1C  CBG: Recent Labs  Lab 05/10/18 2300  GLUCAP 143*     Critical care time: 45 minutes managing severe life threatening encephalopathy requiring careful monitoring in ICU with management of multiple complex databases.    Heber CarolinaBrent Abdur Hoglund, MD [place] [facility] Pager: [phone] Cell: [phone] If no response, call [phone]

## 2018-05-11 NOTE — Progress Notes (Signed)
Pt is continually moving and fidgeting. Pt has had to have several IV's placed because this and unable to get an accurate BP measurement do to this. Elink made aware. Will continue to assess.

## 2018-05-11 NOTE — Progress Notes (Signed)
eLink Physician-Brief Progress Note Patient Name: Vincent CanFred B Umbaugh DOB: 02/06/1966 MRN: [id number]   Date of Service  05/11/2018  HPI/Events of Note  Hypokalemia, hypophos  eICU Interventions  Potassium and phos replaced     Intervention Category Intermediate Interventions: Electrolyte abnormality - evaluation and management  [name] 05/11/2018, 12:27 AM

## 2018-05-11 NOTE — Progress Notes (Signed)
CRITICAL VALUE ALERT  Critical Value:  K 2.4  Date & Time Notied:  12-31/19 @ 0015  Provider Notified: Elink  Orders Received/Actions taken:
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
## 2018-05-12 LAB — COMPREHENSIVE METABOLIC PANEL
ALT: 26 U/L (ref 0–44)
AST: 29 U/L (ref 15–41)
Albumin: 3.3 g/dL — ABNORMAL LOW (ref 3.5–5.0)
Alkaline Phosphatase: 66 U/L (ref 38–126)
Anion gap: 11 (ref 5–15)
BUN: 15 mg/dL (ref 6–20)
CO2: 21 mmol/L — ABNORMAL LOW (ref 22–32)
Calcium: 8.3 mg/dL — ABNORMAL LOW (ref 8.9–10.3)
Chloride: 105 mmol/L (ref 98–111)
Creatinine, Ser: 0.86 mg/dL (ref 0.61–1.24)
GFR calc Af Amer: >60 mL/min (ref >60)
GFR calc non Af Amer: >60 mL/min (ref >60)
Glucose, Bld: 130 mg/dL — ABNORMAL HIGH (ref 70–99)
Potassium: 2.9 mmol/L — ABNORMAL LOW (ref 3.5–5.1)
Sodium: 137 mmol/L (ref 135–145)
Total Bilirubin: 0.9 mg/dL (ref 0.3–1.2)
Total Protein: 6.5 g/dL (ref 6.5–8.1)

## 2018-05-12 LAB — T3, FREE: T3, Free: 2.7 pg/mL (ref 2.0–4.4)

## 2018-05-12 MED ORDER — POTASSIUM CHLORIDE CRYS ER 20 MEQ PO TBCR
40.0000 meq | EXTENDED_RELEASE_TABLET | ORAL | Status: AC
  Administered 2018-05-12 (×2): 40 meq via ORAL
  Filled 2018-05-12 (×2): qty 2

## 2018-05-12 MED ORDER — VITAMIN B-1 100 MG PO TABS
100.0000 mg | ORAL_TABLET | Freq: Every day | ORAL | Status: DC
  Administered 2018-05-13 – 2018-05-15 (×3): 100 mg via ORAL
  Filled 2018-05-12 (×3): qty 1

## 2018-05-12 MED ORDER — SODIUM CHLORIDE 0.9 % IV SOLN: INTRAVENOUS | Status: DC

## 2018-05-12 MED ORDER — ALUM & MAG HYDROXIDE-SIMETH 200-200-20 MG/5ML PO SUSP
30.0000 mL | Freq: Once | ORAL | Status: AC
  Administered 2018-05-12: 30 mL via ORAL
  Filled 2018-05-12: qty 30

## 2018-05-12 MED ORDER — FAMOTIDINE 20 MG PO TABS
20.0000 mg | ORAL_TABLET | Freq: Two times a day (BID) | ORAL | Status: DC
  Administered 2018-05-12 – 2018-05-13 (×4): 20 mg via ORAL
  Filled 2018-05-12 (×4): qty 1

## 2018-05-12 MED ORDER — AMLODIPINE BESYLATE 5 MG PO TABS
5.0000 mg | ORAL_TABLET | Freq: Every day | ORAL | Status: DC
  Administered 2018-05-12 – 2018-05-13 (×2): 5 mg via ORAL
  Filled 2018-05-12 (×2): qty 1

## 2018-05-12 MED ORDER — FOLIC ACID 1 MG PO TABS
1.0000 mg | ORAL_TABLET | Freq: Every day | ORAL | Status: DC
  Administered 2018-05-13 – 2018-05-15 (×3): 1 mg via ORAL
  Filled 2018-05-12 (×3): qty 1

## 2018-05-12 NOTE — Progress Notes (Signed)
NAME:  Vincent Clarke, MRN:  [id number], DOB:  07/28/1965, LOS: 2 ADMISSION DATE:  05/10/2018, CONSULTATION DATE:  12/30 REFERRING MD:  Dr. Rosalia Hammersay, CHIEF COMPLAINT:  confusion   Brief History   53 y/o male admitted with confusion and concern for opiate withdrawal.    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 rate (!) 26, height 5\' 5"  (1.651 m), weight 73.9 kg, SpO2 92 %.        Intake/Output Summary (Last 24 hours) at 05/12/2018 1202 Last data filed at 05/12/2018 1200 Gross per 24 hour  Intake 1535.57 ml  Output 1575 ml  Net -39.43 ml   Filed Weights   05/10/18 2250 05/11/18 0440 05/12/18 0500  Weight: 71.5 kg 72.2 kg 73.9 kg    Examination:  General:  Resting comfortably in bed HENT: NCAT OP clear PULM: CTA B, normal effort CV: RRR, no mgr GI: BS+, soft, nontender MSK: normal bulk and tone Neuro: awake, alert,  MAEW   Resolved Hospital Problem list     Assessment & Plan:  Acute encephalopathy due to opiate withdrawal Heavy opiate addiction, high risk of death from this > continue suboxone 4/1 bid > will need outpatient suboxone management  Fever> presume narcotic withdrawal > monitor cultures  Hypertension > add amlodipine > continue prn labetalol  Hypokalemia: > replace  Best practice:  Diet: regular diet Pain/Anxiety/Delirium protocol (if  indicated): n/a VAP protocol (if indicated): n/a DVT prophylaxis: sub q heparin GI prophylaxis: n/a Glucose control: monitor Mobility: bed rest for now Code Status: full Family Communication: none bedside Disposition: move to med-surg/floor, TRH  Labs   CBC: Recent Labs  Lab 05/10/18 1420 05/10/18 1614 05/10/18 2320 05/11/18 1028  WBC 15.9*  --  19.9* 24.8*  NEUTROABS  --  12.7*  --  19.9*  HGB 17.8*  --  17.2* 15.7  HCT 51.2  --  47.4 44.6  MCV 84.3  --  82.6 84.2  PLT 483*  --  489* 351    Basic Metabolic Panel: Recent Labs  Lab 05/10/18 1420 05/10/18 1511 05/10/18 2320 05/11/18 1028 05/12/18 0301  NA 139  --  137 138 137  K 3.7  --  2.4* 2.8* 2.9*  CL 98  --  96* 102 105  CO2 22  --  25 24 21*  GLUCOSE 161*  --  176* 125* 130*  BUN 10  --  7 9 15   CREATININE 1.09  --  1.05 0.86 0.86  CALCIUM 10.3  --  9.4 8.5* 8.3*  MG  --  1.6* 2.0  --   --   PHOS  --   --  1.5*  --   --    GFR: Estimated Creatinine Clearance: 93.4 mL/min (by C-G formula based on SCr of 0.86 mg/dL). Recent Labs  Lab 05/10/18 1420 05/10/18 1614 05/10/18 2112 05/10/18 2320 05/11/18 1028  PROCALCITON  --   --   --  <  0.10  --   WBC 15.9*  --   --  19.9* 24.8*  LATICACIDVEN  --  3.9* 1.7  --   --     Liver Function Tests: Recent Labs  Lab 05/10/18 1420 05/11/18 1028 05/12/18 0301  AST 29 38 29  ALT 29 25 26   ALKPHOS 102 76 66  BILITOT 1.8* 0.9 0.9  PROT 8.1 6.4* 6.5  ALBUMIN 4.3 3.5 3.3*   Recent Labs  Lab 05/10/18 1420  LIPASE 25   Recent Labs  Lab 05/10/18 1614  AMMONIA 37*    ABG    Component Value Date/Time   PHART 7.655 (HH) 05/10/2018 2229   PCO2ART 24.4 (L) 05/10/2018 2229   PO2ART 66.0 (L) 05/10/2018 2229   HCO3 27.1 05/10/2018 2229   TCO2 28 05/10/2018 2229   O2SAT 96.0 05/10/2018 2229     Coagulation Profile: No results for input(s): INR, PROTIME in the last 168 hours.  Cardiac Enzymes: Recent Labs  Lab 05/10/18 1614  TROPONINI <0.03     HbA1C: No results found for: HGBA1C  CBG: Recent Labs  Lab 05/10/18 2300  GLUCAP 143*     Critical care time: 45 minutes managing severe life threatening encephalopathy requiring careful monitoring in ICU with management of multiple complex databases.    Heber , MD [place] [facility] Pager: [phone] Cell: [phone] If no response, call [phone]

## 2018-05-12 NOTE — Progress Notes (Signed)
Patient c/o mid sternal chest pain. No change in vital signs or EKG rhythmn. CCM notified. GI cocktail ordered and given. Pain subsided. Pt resting asleep at 1400.

## 2018-05-12 NOTE — Progress Notes (Signed)
Pt transferred to via wheelchair.  VSS.  BP  Slightly elevated.  Will monitor.  Pt remains calm, sleeping at intervals, oriented and cooperative.  Report called to RN.

## 2018-05-13 DIAGNOSIS — R4182 Altered mental status, unspecified: Secondary | ICD-10-CM

## 2018-05-13 LAB — CBC
HCT: 39.8 % (ref 39.0–52.0)
Hemoglobin: 13.9 g/dL (ref 13.0–17.0)
MCH: 30.2 pg (ref 26.0–34.0)
MCHC: 34.9 g/dL (ref 30.0–36.0)
MCV: 86.5 fL (ref 80.0–100.0)
Platelets: 295 10*3/uL (ref 150–400)
RBC: 4.6 MIL/uL (ref 4.22–5.81)
RDW: 12.7 % (ref 11.5–15.5)
WBC: 12.9 10*3/uL — ABNORMAL HIGH (ref 4.0–10.5)
nRBC: 0 % (ref 0.0–0.2)

## 2018-05-13 LAB — BASIC METABOLIC PANEL
Anion gap: 9 (ref 5–15)
BUN: 9 mg/dL (ref 6–20)
CO2: 19 mmol/L — ABNORMAL LOW (ref 22–32)
Calcium: 8.6 mg/dL — ABNORMAL LOW (ref 8.9–10.3)
Chloride: 108 mmol/L (ref 98–111)
Creatinine, Ser: 0.78 mg/dL (ref 0.61–1.24)
Glucose, Bld: 101 mg/dL — ABNORMAL HIGH (ref 70–99)
Potassium: 3.6 mmol/L (ref 3.5–5.1)
Sodium: 136 mmol/L (ref 135–145)

## 2018-05-13 MED ORDER — MENTHOL 3 MG MT LOZG
1.0000 | LOZENGE | OROMUCOSAL | Status: DC | PRN
  Administered 2018-05-13: 3 mg via ORAL
  Filled 2018-05-13: qty 9

## 2018-05-13 MED ORDER — AMLODIPINE BESYLATE 10 MG PO TABS
10.0000 mg | ORAL_TABLET | Freq: Every day | ORAL | Status: DC
  Administered 2018-05-14 – 2018-05-15 (×2): 10 mg via ORAL
  Filled 2018-05-13 (×2): qty 1
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
## 2018-05-13 NOTE — Care Management Note (Signed)
Case Management Note [name], RN MSN CCM Transitions of Care [facility] [place] [phone]  Patient Details  Name: Vincent Clarke MRN: [id number] Date of Birth: 15-Dec-1965  Subjective/Objective:               Acute encephaolopathy     Action/Plan: PTA home with wife. Independent. No HH or DME needs. Patient states that he has GoodRx card for prescritptions. Pt does not have insurance, and per [facility] note does not qualify for Medicaid. Pt asked about suboxone. Provided pt list of [facility] providers. Pt reports PCP is Dr. Jeannetta Nap. Pt reports that he has transportation to get to follow up appoints. Will continue to follow for transition of care needs.   Expected Discharge Date:                  Expected Discharge Plan:  Home/Self Care  In-House Referral:  Financial Counselor  Discharge planning Services  CM Consult, Other - See comment(Suboxone provider list for [place], [place])  Post Acute Care Choice:    Choice offered to:  NA  DME Arranged:  N/A DME Agency:  NA  HH Arranged:  NA HH Agency:  NA  Status of Service:  In process, will continue to follow  If discussed at Long Length of Stay Meetings, dates discussed:    Additional Comments:  Bess Kinds, RN 05/13/2018, 4:23 PM

## 2018-05-13 NOTE — Progress Notes (Signed)
PROGRESS NOTE    Vincent Clarke  CWC:[id number] DOB: [birth year] DOA: 05/10/2018 PCP: Kaleen Mask, MD     Brief Narrative:  Vincent Clarke is a 53 year old male with past medical history of opioid abuse (snorts fentanyl), who presented with altered mental status, vomiting, diarrhea.  In the emergency department, he became increasingly lethargic.  Due to worsening mental status, critical care as well as neurology were consulted.  He underwent LP on 12/30.  He was empirically started on ampicillin, ceftriaxone, vancomycin due to concern for CNS infection.  Neurology believed his presentation was consistent with delirium secondary to opiate withdrawal.  Patient was managed with supportive care, started on Suboxone by critical care team on 12/31.  New events last 24 hours / Subjective: No new acute events overnight.  Assessment & Plan:   Principal Problem:   Acute encephalopathy Active Problems:   HTN (hypertension)   Opioid withdrawal (HCC)   Acute encephalopathy secondary to opioid withdrawal Resolved, seems to be back at his baseline, is alert and oriented x3  Opiate abuse Started on Suboxone on 12/31 Will need outpatient Suboxone management.  Patient tells me that he does not have insurance.  Will touch base with Dr. Oswaldo Done from IM teaching service for recommendation on outpatient management  Fever without source of infection Blood cultures negative to date Urine culture negative CSF culture negative  Afebrile now, leukocytosis improving   HTN Elevated today, increase Norvasc    DVT prophylaxis: Subq hep Code Status: Full Family Communication: No family at bedside Disposition Plan: Need safe outpatient suboxone program management    Consultants:   PCCM   Neurology   Antimicrobials:  Anti-infectives (From admission, onward)   Start     Dose/Rate Route Frequency Ordered Stop   05/11/18 0800  acyclovir (ZOVIRAX) 815 mg in dextrose 5 % 150 mL IVPB  Status:   Discontinued     10 mg/kg  81.6 kg 166.3 mL/hr over 60 Minutes Intravenous Every 8 hours 05/10/18 2159 05/10/18 2241   05/11/18 0500  vancomycin (VANCOCIN) IVPB 1000 mg/200 mL premix  Status:  Discontinued     1,000 mg 200 mL/hr over 60 Minutes Intravenous Every 12 hours 05/10/18 1613 05/10/18 2241   05/10/18 2200  acyclovir (ZOVIRAX) 815 mg in dextrose 5 % 150 mL IVPB     10 mg/kg  81.6 kg 166.3 mL/hr over 60 Minutes Intravenous  Once 05/10/18 2159 05/11/18 0141   05/10/18 1700  ampicillin (OMNIPEN) 2 g in sodium chloride 0.9 % 100 mL IVPB  Status:  Discontinued     2 g 300 mL/hr over 20 Minutes Intravenous Every 4 hours 05/10/18 1619 05/10/18 2241   05/10/18 1615  vancomycin (VANCOCIN) 1,500 mg in sodium chloride 0.9 % 500 mL IVPB     1,500 mg 250 mL/hr over 120 Minutes Intravenous  Once 05/10/18 1604 05/10/18 1939   05/10/18 1600  cefTRIAXone (ROCEPHIN) 2 g in sodium chloride 0.9 % 100 mL IVPB  Status:  Discontinued     2 g 200 mL/hr over 30 Minutes Intravenous Every 12 hours 05/10/18 1557 05/10/18 2241   05/10/18 1600  vancomycin (VANCOCIN) IVPB 1000 mg/200 mL premix  Status:  Discontinued     1,000 mg 200 mL/hr over 60 Minutes Intravenous  Once 05/10/18 1557 05/10/18 1604       Objective: Vitals:   05/12/18 1901 05/12/18 2322 05/13/18 0526 05/13/18 0905  BP: (!) 155/87 (!) 161/90 (!) 182/97 (!) 164/88  Pulse: 78 79 72 72  Resp: 18 18 16 18   Temp: 98.6 F (37 C) 98.4 F (36.9 C) 99 F (37.2 C) 98.3 F (36.8 C)  TempSrc: Oral Oral Oral Oral  SpO2: 98% 99% 99% 99%  Weight:   71.6 kg   Height:   5\' 5"  (1.651 m)     Intake/Output Summary (Last 24 hours) at 05/13/2018 1455 Last data filed at 05/13/2018 1100 Gross per 24 hour  Intake 890 ml  Output 1975 ml  Net -1085 ml   Filed Weights   05/11/18 0440 05/12/18 0500 05/13/18 0526  Weight: 72.2 kg 73.9 kg 71.6 kg    Examination:  General exam: Appears calm and comfortable  Respiratory system: Clear to auscultation.  Respiratory effort normal. Cardiovascular system: S1 & S2 heard, RRR. No JVD, murmurs, rubs, gallops or clicks. No pedal edema. Gastrointestinal system: Abdomen is nondistended, soft and nontender. No organomegaly or masses felt. Normal bowel sounds heard. Central nervous system: Alert and oriented. No focal neurological deficits. Extremities: Symmetric 5 x 5 power. Skin: No rashes, lesions or ulcers Psychiatry: Judgement and insight appear normal. Mood & affect appropriate.   Data Reviewed: I have personally reviewed following labs and imaging studies  CBC: Recent Labs  Lab 05/10/18 1420 05/10/18 1614 05/10/18 2320 05/11/18 1028 05/13/18 0822  WBC 15.9*  --  19.9* 24.8* 12.9*  NEUTROABS  --  12.7*  --  19.9*  --   HGB 17.8*  --  17.2* 15.7 13.9  HCT 51.2  --  47.4 44.6 39.8  MCV 84.3  --  82.6 84.2 86.5  PLT 483*  --  489* 351 295   Basic Metabolic Panel: Recent Labs  Lab 05/10/18 1420 05/10/18 1511 05/10/18 2320 05/11/18 1028 05/12/18 0301 05/13/18 0456  NA 139  --  137 138 137 136  K 3.7  --  2.4* 2.8* 2.9* 3.6  CL 98  --  96* 102 105 108  CO2 22  --  25 24 21* 19*  GLUCOSE 161*  --  176* 125* 130* 101*  BUN 10  --  7 9 15 9   CREATININE 1.09  --  1.05 0.86 0.86 0.78  CALCIUM 10.3  --  9.4 8.5* 8.3* 8.6*  MG  --  1.6* 2.0  --   --   --   PHOS  --   --  1.5*  --   --   --    GFR: Estimated Creatinine Clearance: 92.9 mL/min (by C-G formula based on SCr of 0.78 mg/dL). Liver Function Tests: Recent Labs  Lab 05/10/18 1420 05/11/18 1028 05/12/18 0301  AST 29 38 29  ALT 29 25 26   ALKPHOS 102 76 66  BILITOT 1.8* 0.9 0.9  PROT 8.1 6.4* 6.5  ALBUMIN 4.3 3.5 3.3*   Recent Labs  Lab 05/10/18 1420  LIPASE 25   Recent Labs  Lab 05/10/18 1614  AMMONIA 37*   Coagulation Profile: No results for input(s): INR, PROTIME in the last 168 hours. Cardiac Enzymes: Recent Labs  Lab 05/10/18 1614  TROPONINI <0.03   BNP (last 3 results) No results for input(s):  PROBNP in the last 8760 hours. HbA1C: No results for input(s): HGBA1C in the last 72 hours. CBG: Recent Labs  Lab 05/10/18 2300  GLUCAP 143*   Lipid Profile: No results for input(s): CHOL, HDL, LDLCALC, TRIG, CHOLHDL, LDLDIRECT in the last 72 hours. Thyroid Function Tests: Recent Labs    05/10/18 1614 05/10/18 2112 05/10/18 2320  TSH 0.283*  --   --  FREET4  --  1.18  --   T3FREE  --   --  2.7   Anemia Panel: No results for input(s): VITAMINB12, FOLATE, FERRITIN, TIBC, IRON, RETICCTPCT in the last 72 hours. Sepsis Labs: Recent Labs  Lab 05/10/18 1614 05/10/18 2112 05/10/18 2320  PROCALCITON  --   --  <0.10  LATICACIDVEN 3.9* 1.7  --     Recent Results (from the past 240 hour(s))  Urine culture     Status: None   Collection Time: 05/10/18  3:44 PM  Result Value Ref Range Status   Specimen Description URINE, RANDOM  Final   Special Requests NONE  Final   Culture   Final    NO GROWTH Performed at [facility], [address] [place], [place] [address]    Report Status 05/11/2018 FINAL  Final  Blood culture (routine x 2)     Status: None (Preliminary result)   Collection Time: 05/10/18  3:48 PM  Result Value Ref Range Status   Specimen Description BLOOD RIGHT ANTECUBITAL  Final   Special Requests   Final    BOTTLES DRAWN AEROBIC AND ANAEROBIC Blood Culture adequate volume   Culture   Final    NO GROWTH 3 DAYS Performed at [facility], [address] [place], [place] [address]    Report Status PENDING  Incomplete  CSF culture     Status: None (Preliminary result)   Collection Time: 05/10/18  3:59 PM  Result Value Ref Range Status   Specimen Description CSF  Final   Special Requests NONE  Final   Gram Stain   Final    CYTOSPIN SMEAR WBC PRESENT, PREDOMINANTLY MONONUCLEAR NO ORGANISMS SEEN    Culture   Final    NO GROWTH 3 DAYS Performed at [facility], [address] [place], [place] [address]    Report Status PENDING  Incomplete    Respiratory Panel by PCR     Status: None   Collection Time: 05/10/18  3:59 PM  Result Value Ref Range Status   Adenovirus NOT DETECTED NOT DETECTED Final   Coronavirus 229E NOT DETECTED NOT DETECTED Final   Coronavirus HKU1 NOT DETECTED NOT DETECTED Final   Coronavirus NL63 NOT DETECTED NOT DETECTED Final   Coronavirus OC43 NOT DETECTED NOT DETECTED Final   Metapneumovirus NOT DETECTED NOT DETECTED Final   Rhinovirus / Enterovirus NOT DETECTED NOT DETECTED Final   Influenza A NOT DETECTED NOT DETECTED Final   Influenza B NOT DETECTED NOT DETECTED Final   Parainfluenza Virus 1 NOT DETECTED NOT DETECTED Final   Parainfluenza Virus 2 NOT DETECTED NOT DETECTED Final   Parainfluenza Virus 3 NOT DETECTED NOT DETECTED Final   Parainfluenza Virus 4 NOT DETECTED NOT DETECTED Final   Respiratory Syncytial Virus NOT DETECTED NOT DETECTED Final   Bordetella pertussis NOT DETECTED NOT DETECTED Final   Chlamydophila pneumoniae NOT DETECTED NOT DETECTED Final   Mycoplasma pneumoniae NOT DETECTED NOT DETECTED Final    Comment: Performed at [facility], [address] [place], [place] [address]  Blood culture (routine x 2)     Status: None (Preliminary result)   Collection Time: 05/10/18  4:33 PM  Result Value Ref Range Status   Specimen Description BLOOD RIGHT FOREARM  Final   Special Requests   Final    BOTTLES DRAWN AEROBIC AND ANAEROBIC Blood Culture adequate volume   Culture   Final    NO GROWTH 3 DAYS Performed at  [facility], [address] [place] [place], [place] [address]    Report Status PENDING  Incomplete  MRSA PCR Screening     Status: None   Collection Time: 05/10/18 10:50 PM  Result Value Ref Range Status   MRSA by PCR NEGATIVE NEGATIVE Final    Comment:        The GeneXpert MRSA Assay (FDA approved for NASAL specimens only), is one component of a comprehensive MRSA colonization surveillance program. It is not intended to diagnose MRSA infection nor to guide  or monitor treatment for MRSA infections. Performed at [facility], [address] [place], [place] [address]        Radiology Studies: No results found.    Scheduled Meds: . [START ON 05/14/2018] amLODipine  10 mg Oral Daily  . buprenorphine-naloxone  2 tablet Sublingual BID  . famotidine  20 mg Oral BID  . folic acid  1 mg Oral Daily  . heparin  5,000 Units Subcutaneous Q8H  . mouth rinse  15 mL Mouth Rinse q12n4p  . thiamine  100 mg Oral Daily   Continuous Infusions: . sodium chloride    . sodium chloride       LOS: 3 days    Time spent: 35 minutes   Noralee Stain, DO [facility] [email or web address] Password [facility] 05/13/2018, 2:55 PM

## 2018-05-14 DIAGNOSIS — R509 Fever, unspecified: Secondary | ICD-10-CM

## 2018-05-14 DIAGNOSIS — F1410 Cocaine abuse, uncomplicated: Secondary | ICD-10-CM

## 2018-05-14 LAB — CBC
HCT: 39 % (ref 39.0–52.0)
Hemoglobin: 13.5 g/dL (ref 13.0–17.0)
MCH: 30.1 pg (ref 26.0–34.0)
MCHC: 34.6 g/dL (ref 30.0–36.0)
MCV: 87.1 fL (ref 80.0–100.0)
Platelets: 292 10*3/uL (ref 150–400)
RBC: 4.48 MIL/uL (ref 4.22–5.81)
RDW: 12.7 % (ref 11.5–15.5)
WBC: 12.1 10*3/uL — ABNORMAL HIGH (ref 4.0–10.5)
nRBC: 0 % (ref 0.0–0.2)

## 2018-05-14 LAB — TROPONIN I
Troponin I: 0.07 ng/mL (ref <0.03)
Troponin I: 0.07 ng/mL (ref <0.03)

## 2018-05-14 LAB — CSF CULTURE W GRAM STAIN

## 2018-05-14 LAB — CSF CULTURE: Culture: NO GROWTH

## 2018-05-14 MED ORDER — NON FORMULARY: 150.0000 mg | Freq: Two times a day (BID) | Status: DC

## 2018-05-14 MED ORDER — BUPRENORPHINE HCL-NALOXONE HCL 8-2 MG SL SUBL
1.0000 | SUBLINGUAL_TABLET | Freq: Every day | SUBLINGUAL | Status: DC
  Administered 2018-05-15: 1 via SUBLINGUAL
  Filled 2018-05-14: qty 1

## 2018-05-14 MED ORDER — RANITIDINE HCL 150 MG PO TABS
150.0000 mg | ORAL_TABLET | Freq: Two times a day (BID) | ORAL | Status: DC
  Administered 2018-05-14 – 2018-05-15 (×3): 150 mg via ORAL
  Filled 2018-05-14 (×3): qty 1

## 2018-05-14 MED ORDER — ALUM & MAG HYDROXIDE-SIMETH 200-200-20 MG/5ML PO SUSP
30.0000 mL | Freq: Once | ORAL | Status: AC
  Administered 2018-05-14: 30 mL via ORAL
  Filled 2018-05-14: qty 30

## 2018-05-14 MED ORDER — BUPRENORPHINE HCL-NALOXONE HCL 8-2 MG SL SUBL: 1.0000 | SUBLINGUAL_TABLET | Freq: Every day | SUBLINGUAL | 0 refills | Status: DC

## 2018-05-14 NOTE — Progress Notes (Signed)
OT Cancellation Note  Patient Details Name: ALCIDE MARCELO MRN: [id number] DOB: May 27, 1965   Cancelled Treatment:    Reason Eval/Treat Not Completed: OT screened, no needs identified, will sign off(Per PT note, Pt independent/at baseline.)  Emelda Fear 05/14/2018, 7:56 AM   Sherryl Manges OTR/L Acute Rehabilitation Services Pager: [phone] Office: [phone]

## 2018-05-14 NOTE — Progress Notes (Signed)
PROGRESS NOTE    Vincent CanFred B Tri  VHQ:[id number] DOB: 01/30/1966 DOA: 05/10/2018 PCP: Kaleen MaskElkins, Wilson Oliver, MD     Brief Narrative:  Vincent Clarke is a 53 year old male with past medical history of opioid abuse (snorts fentanyl), who presented with altered mental status, vomiting, diarrhea.  In the emergency department, he became increasingly lethargic.  Due to worsening mental status, critical care as well as neurology were consulted.  He underwent LP on 12/30.  He was empirically started on ampicillin, ceftriaxone, vancomycin due to concern for CNS infection.  Neurology believed his presentation was consistent with delirium secondary to opiate withdrawal.  Patient was managed with supportive care, started on Suboxone by critical care team on 12/31.  New events last 24 hours / Subjective: Complained of central chest pain starting around 3 AM.  He states that it is sharp in nature, worsens with deep breaths and cough, not reproducible.  Does not feel that this pain is similar to his reflux.  Denies any nausea, vomiting, diarrhea or abdominal pain.  Assessment & Plan:   Principal Problem:   Acute encephalopathy Active Problems:   HTN (hypertension)   Opioid withdrawal (HCC)   Acute encephalopathy secondary to opioid withdrawal Resolved, back at his baseline, is alert and oriented x3  Atypical chest pain EKG reviewed independently, normal sinus without any ST changes.  Troponin pending.  Chest pain description is atypical in nature, sharp central chest pain that worsens with cough or deep breaths.  Trial Maalox to see if this will help.  He does have history of reflux disease.  Opiate abuse Started on Suboxone on 12/31.  Appreciate internal medicine teaching service for outpatient Suboxone follow-up range for Tuesday 1/7 at 9:15 AM  Fever without source of infection Blood cultures negative to date Urine culture negative CSF culture negative  Afebrile now, leukocytosis improving    HTN Improved on increased dose of Norvasc   DVT prophylaxis: Subq hep Code Status: Full Family Communication: No family at bedside Disposition Plan: Plan for discharge home tomorrow as long as troponin remains negative and chest pain-free   Consultants:   PCCM   Neurology  Internal medicine teaching service for Suboxone follow-up   Antimicrobials:  Anti-infectives (From admission, onward)   Start     Dose/Rate Route Frequency Ordered Stop   05/11/18 0800  acyclovir (ZOVIRAX) 815 mg in dextrose 5 % 150 mL IVPB  Status:  Discontinued     10 mg/kg  81.6 kg 166.3 mL/hr over 60 Minutes Intravenous Every 8 hours 05/10/18 2159 05/10/18 2241   05/11/18 0500  vancomycin (VANCOCIN) IVPB 1000 mg/200 mL premix  Status:  Discontinued     1,000 mg 200 mL/hr over 60 Minutes Intravenous Every 12 hours 05/10/18 1613 05/10/18 2241   05/10/18 2200  acyclovir (ZOVIRAX) 815 mg in dextrose 5 % 150 mL IVPB     10 mg/kg  81.6 kg 166.3 mL/hr over 60 Minutes Intravenous  Once 05/10/18 2159 05/11/18 0141   05/10/18 1700  ampicillin (OMNIPEN) 2 g in sodium chloride 0.9 % 100 mL IVPB  Status:  Discontinued     2 g 300 mL/hr over 20 Minutes Intravenous Every 4 hours 05/10/18 1619 05/10/18 2241   05/10/18 1615  vancomycin (VANCOCIN) 1,500 mg in sodium chloride 0.9 % 500 mL IVPB     1,500 mg 250 mL/hr over 120 Minutes Intravenous  Once 05/10/18 1604 05/10/18 1939   05/10/18 1600  cefTRIAXone (ROCEPHIN) 2 g in sodium chloride 0.9 % 100  mL IVPB  Status:  Discontinued     2 g 200 mL/hr over 30 Minutes Intravenous Every 12 hours 05/10/18 1557 05/10/18 2241   05/10/18 1600  vancomycin (VANCOCIN) IVPB 1000 mg/200 mL premix  Status:  Discontinued     1,000 mg 200 mL/hr over 60 Minutes Intravenous  Once 05/10/18 1557 05/10/18 1604       Objective: Vitals:   05/13/18 1751 05/13/18 2100 05/14/18 0510 05/14/18 0949  BP: (!) 150/78 124/88 (!) 143/86 (!) 141/88  Pulse: 65 63 (!) 57 60  Resp: 18 18 16 18    Temp: 98.8 F (37.1 C) 98.3 F (36.8 C) 98 F (36.7 C) 98.3 F (36.8 C)  TempSrc: Oral Oral Oral Oral  SpO2: 97% 97% 100% 97%  Weight:      Height:        Intake/Output Summary (Last 24 hours) at 05/14/2018 1307 Last data filed at 05/14/2018 0900 Gross per 24 hour  Intake 520 ml  Output 0 ml  Net 520 ml   Filed Weights   05/11/18 0440 05/12/18 0500 05/13/18 0526  Weight: 72.2 kg 73.9 kg 71.6 kg    Examination: General exam: Appears calm and comfortable  Respiratory system: Clear to auscultation. Respiratory effort normal. Cardiovascular system: S1 & S2 heard, RRR. No JVD, murmurs, rubs, gallops or clicks. No pedal edema. Gastrointestinal system: Abdomen is nondistended, soft and nontender. No organomegaly or masses felt. Normal bowel sounds heard. Central nervous system: Alert and oriented. No focal neurological deficits. Extremities: Symmetric 5 x 5 power. Skin: No rashes, lesions or ulcers Psychiatry: Judgement and insight appear normal. Mood & affect appropriate.    Data Reviewed: I have personally reviewed following labs and imaging studies  CBC: Recent Labs  Lab 05/10/18 1420 05/10/18 1614 05/10/18 2320 05/11/18 1028 05/13/18 0822 05/14/18 0809  WBC 15.9*  --  19.9* 24.8* 12.9* 12.1*  NEUTROABS  --  12.7*  --  19.9*  --   --   HGB 17.8*  --  17.2* 15.7 13.9 13.5  HCT 51.2  --  47.4 44.6 39.8 39.0  MCV 84.3  --  82.6 84.2 86.5 87.1  PLT 483*  --  489* 351 295 292   Basic Metabolic Panel: Recent Labs  Lab 05/10/18 1420 05/10/18 1511 05/10/18 2320 05/11/18 1028 05/12/18 0301 05/13/18 0456  NA 139  --  137 138 137 136  K 3.7  --  2.4* 2.8* 2.9* 3.6  CL 98  --  96* 102 105 108  CO2 22  --  25 24 21* 19*  GLUCOSE 161*  --  176* 125* 130* 101*  BUN 10  --  7 9 15 9   CREATININE 1.09  --  1.05 0.86 0.86 0.78  CALCIUM 10.3  --  9.4 8.5* 8.3* 8.6*  MG  --  1.6* 2.0  --   --   --   PHOS  --   --  1.5*  --   --   --    GFR: Estimated Creatinine  Clearance: 92.9 mL/min (by C-G formula based on SCr of 0.78 mg/dL). Liver Function Tests: Recent Labs  Lab 05/10/18 1420 05/11/18 1028 05/12/18 0301  AST 29 38 29  ALT 29 25 26   ALKPHOS 102 76 66  BILITOT 1.8* 0.9 0.9  PROT 8.1 6.4* 6.5  ALBUMIN 4.3 3.5 3.3*   Recent Labs  Lab 05/10/18 1420  LIPASE 25   Recent Labs  Lab 05/10/18 1614  AMMONIA 37*   Coagulation Profile:  No results for input(s): INR, PROTIME in the last 168 hours. Cardiac Enzymes: Recent Labs  Lab 05/10/18 1614  TROPONINI <0.03   BNP (last 3 results) No results for input(s): PROBNP in the last 8760 hours. HbA1C: No results for input(s): HGBA1C in the last 72 hours. CBG: Recent Labs  Lab 05/10/18 2300  GLUCAP 143*   Lipid Profile: No results for input(s): CHOL, HDL, LDLCALC, TRIG, CHOLHDL, LDLDIRECT in the last 72 hours. Thyroid Function Tests: No results for input(s): TSH, T4TOTAL, FREET4, T3FREE, THYROIDAB in the last 72 hours. Anemia Panel: No results for input(s): VITAMINB12, FOLATE, FERRITIN, TIBC, IRON, RETICCTPCT in the last 72 hours. Sepsis Labs: Recent Labs  Lab 05/10/18 1614 05/10/18 2112 05/10/18 2320  PROCALCITON  --   --  <0.10  LATICACIDVEN 3.9* 1.7  --     Recent Results (from the past 240 hour(s))  Urine culture     Status: None   Collection Time: 05/10/18  3:44 PM  Result Value Ref Range Status   Specimen Description URINE, RANDOM  Final   Special Requests NONE  Final   Culture   Final    NO GROWTH Performed at [facility], [address], [place], [place] [address]    Report Status 05/11/2018 FINAL  Final  Blood culture (routine x 2)     Status: None (Preliminary result)   Collection Time: 05/10/18  3:48 PM  Result Value Ref Range Status   Specimen Description BLOOD RIGHT ANTECUBITAL  Final   Special Requests   Final    BOTTLES DRAWN AEROBIC AND ANAEROBIC Blood Culture adequate volume   Culture   Final    NO GROWTH 4 DAYS Performed at [facility], [address], [place], [place] [address]    Report Status PENDING  Incomplete  CSF culture     Status: None   Collection Time: 05/10/18  3:59 PM  Result Value Ref Range Status   Specimen Description CSF  Final   Special Requests NONE  Final   Gram Stain   Final    CYTOSPIN SMEAR WBC PRESENT, PREDOMINANTLY MONONUCLEAR NO ORGANISMS SEEN Performed at [facility], [address], [place], [place] [address]    Culture NO GROWTH  Final   Report Status 05/14/2018 FINAL  Final  Respiratory Panel by PCR     Status: None   Collection Time: 05/10/18  3:59 PM  Result Value Ref Range Status   Adenovirus NOT DETECTED NOT DETECTED Final   Coronavirus 229E NOT DETECTED NOT DETECTED Final   Coronavirus HKU1 NOT DETECTED NOT DETECTED Final   Coronavirus NL63 NOT DETECTED NOT DETECTED Final   Coronavirus OC43 NOT DETECTED NOT DETECTED Final   Metapneumovirus NOT DETECTED NOT DETECTED Final   Rhinovirus / Enterovirus NOT DETECTED NOT DETECTED Final   Influenza A NOT DETECTED NOT DETECTED Final   Influenza B NOT DETECTED NOT DETECTED Final   Parainfluenza Virus 1 NOT DETECTED NOT DETECTED Final   Parainfluenza Virus 2 NOT DETECTED NOT DETECTED Final   Parainfluenza Virus 3 NOT DETECTED NOT DETECTED Final   Parainfluenza Virus 4 NOT DETECTED NOT DETECTED Final   Respiratory Syncytial Virus NOT DETECTED NOT DETECTED Final   Bordetella pertussis NOT DETECTED NOT DETECTED Final   Chlamydophila pneumoniae NOT DETECTED NOT DETECTED Final   Mycoplasma pneumoniae NOT DETECTED NOT DETECTED Final    Comment: Performed at [facility], [address] [place], [place] [address]  Blood culture (routine  x 2)     Status: None (Preliminary result)   Collection Time: 05/10/18  4:33 PM  Result Value Ref Range Status   Specimen Description BLOOD RIGHT FOREARM  Final   Special Requests   Final    BOTTLES DRAWN AEROBIC AND ANAEROBIC Blood Culture adequate volume   Culture   Final    NO  GROWTH 4 DAYS Performed at [facility], [address] [place], [place] [address]    Report Status PENDING  Incomplete  MRSA PCR Screening     Status: None   Collection Time: 05/10/18 10:50 PM  Result Value Ref Range Status   MRSA by PCR NEGATIVE NEGATIVE Final    Comment:        The GeneXpert MRSA Assay (FDA approved for NASAL specimens only), is one component of a comprehensive MRSA colonization surveillance program. It is not intended to diagnose MRSA infection nor to guide or monitor treatment for MRSA infections. Performed at [facility], [address] [place], [place] [address]        Radiology Studies: No results found.    Scheduled Meds: . amLODipine  10 mg Oral Daily  . buprenorphine-naloxone  2 tablet Sublingual BID  . [START ON 05/15/2018] buprenorphine-naloxone  1 tablet Sublingual Daily  . folic acid  1 mg Oral Daily  . heparin  5,000 Units Subcutaneous Q8H  . mouth rinse  15 mL Mouth Rinse q12n4p  . ranitidine  150 mg Oral BID  . thiamine  100 mg Oral Daily   Continuous Infusions: . sodium chloride    . sodium chloride       LOS: 4 days    Time spent: 25 minutes   Noralee StainJennifer Selenia Mihok, DO [facility] [email or web address] Password [email or web address] 05/14/2018, 1:07 PM

## 2018-05-14 NOTE — Care Management Note (Signed)
Case Management Note [name], RN MSN CCM Transitions of Care [address] [phone]  Patient Details  Name: Vincent Clarke MRN: [id number] Date of Birth: [birth year]  Subjective/Objective:               Acute encephaolopathy     Action/Plan: PTA home with wife. Independent. No HH or DME needs. Patient states that he has GoodRx card for prescritptions. Pt does not have insurance, and per [facility] note does not qualify for Medicaid. Pt asked about suboxone. Provided pt list of [place] providers. Pt reports PCP is Dr. Jeannetta Nap. Pt reports that he has transportation to get to follow up appoints. Will continue to follow for transition of care needs.   Expected Discharge Date:                  Expected Discharge Plan:  Home/Self Care  In-House Referral:  Financial Counselor  Discharge planning Services  CM Consult, Other - See comment(Suboxone provider list for [place], [place])  Post Acute Care Choice:  NA Choice offered to:  NA  DME Arranged:  N/A DME Agency:  NA  HH Arranged:  NA HH Agency:  NA  Status of Service:  Completed, signed off  If discussed at Long Length of Stay Meetings, dates discussed:    Additional Comments: 05/14/18 1530 [name], RN MSN CCM Noted pt now set up for outpt f/u for suboxone therapy. Anticipate that pt will transition home 05/15/18 pending medically stable. No other transition of care needs identified at this time.   Bess Kinds, RN 05/14/2018, 3:34 PM
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
## 2018-05-15 LAB — CBC
HCT: 39.4 % (ref 39.0–52.0)
Hemoglobin: 13.7 g/dL (ref 13.0–17.0)
MCH: 30 pg (ref 26.0–34.0)
MCHC: 34.8 g/dL (ref 30.0–36.0)
MCV: 86.2 fL (ref 80.0–100.0)
Platelets: 344 10*3/uL (ref 150–400)
RBC: 4.57 MIL/uL (ref 4.22–5.81)
RDW: 12.6 % (ref 11.5–15.5)
WBC: 11.7 10*3/uL — ABNORMAL HIGH (ref 4.0–10.5)
nRBC: 0 % (ref 0.0–0.2)

## 2018-05-15 LAB — CULTURE, BLOOD (ROUTINE X 2)
Culture: NO GROWTH
Culture: NO GROWTH
Special Requests: ADEQUATE
Special Requests: ADEQUATE

## 2018-05-15 LAB — HCV COMMENT:

## 2018-05-15 LAB — HEPATITIS C ANTIBODY (REFLEX): HCV Ab: 0.1 s/co ratio (ref 0.0–0.9)

## 2018-05-15 MED ORDER — AMLODIPINE BESYLATE 10 MG PO TABS: 10.0000 mg | ORAL_TABLET | Freq: Every day | ORAL | 0 refills | Status: DC

## 2018-05-15 MED ORDER — KETOROLAC TROMETHAMINE 30 MG/ML IJ SOLN
INTRAMUSCULAR | Status: AC
  Filled 2018-05-15: qty 1

## 2018-05-15 MED ORDER — KETOROLAC TROMETHAMINE 15 MG/ML IJ SOLN
15.0000 mg | Freq: Once | INTRAMUSCULAR | Status: AC
  Administered 2018-05-15: 15 mg via INTRAVENOUS

## 2018-05-15 NOTE — Progress Notes (Addendum)
Discharge instructions reviewed with patient.  These included, but were not limited to, the following:  Medications and potential side effects, current hospitalization and follow-up care requirements, prescriptions, when to call the MD, when to call 911, Suboxone Clinic guidelines and appointment, activity, etc.  Ascertained comprehension of aforementioned instructions AEB "teachback" technique.  Wife in attendance.  Patient did state that he felt he needed a more stable support system in order to "quit".  It was re-iterated that this could be established for him in the Suboxone clinic.  It was also re-iterated that this was a journey, and not an easy one.  Patient discharged to home with wife.  Obtained ride home with family member.

## 2018-05-15 NOTE — Discharge Summary (Signed)
Physician Discharge Summary  [name]:[id number] DOB: [birth year] DOA: 05/10/2018  PCP: Kaleen Mask, MD  Admit date: 05/10/2018 Discharge date: 05/15/2018  Admitted From: Home Disposition:  Home  Recommendations for Outpatient Follow-up:  1. Follow up with PCP in 1 week 2. Follow up with Suboxone Clinic as scheduled on 1/7  Discharge Condition: Stable CODE STATUS: Full  Diet recommendation: Heart healthy   Brief/Interim Summary: Vincent Clarke is a 53 year old male with past medical history of opioid abuse (snorts fentanyl), who presented with altered mental status, vomiting, diarrhea.  In the emergency department, he became increasingly lethargic.  Due to worsening mental status, critical care as well as neurology were consulted.  He underwent LP on 12/30.  He was empirically started on ampicillin, ceftriaxone, vancomycin due to concern for CNS infection.  Neurology believed his presentation was consistent with delirium secondary to opiate withdrawal.  Patient was managed with supportive care, started on Suboxone by critical care team on 12/31.  Acute encephalopathy secondary to opioid withdrawal Resolved, back at his baseline, is alert and oriented x3  Atypical chest pain EKG reviewed independently, normal sinus without any ST changes.  Troponin flat tred and not consistent with ACS.  Chest pain description is atypical in nature, sharp central chest pain that worsens with cough or deep breaths.  Trial Maalox to see if this will help.  He does have history of reflux disease. IV toradol ordered.   Opiate abuse Started on Suboxone on 12/31.  Appreciate internal medicine teaching service for outpatient Suboxone follow-up range for Tuesday 1/7 at 9:15 AM  Fever without source of infection Blood cultures negative to date Urine culture negative CSF culture negative  Afebrile now, leukocytosis improving   HTN Improved on increased dose of Norvasc   Discharge  Instructions  Discharge Instructions    Call MD for:  difficulty breathing, headache or visual disturbances   Complete by:  As directed    Call MD for:  extreme fatigue   Complete by:  As directed    Call MD for:  hives   Complete by:  As directed    Call MD for:  persistant dizziness or light-headedness   Complete by:  As directed    Call MD for:  persistant nausea and vomiting   Complete by:  As directed    Call MD for:  severe uncontrolled pain   Complete by:  As directed    Call MD for:  temperature >100.4   Complete by:  As directed    Diet - low sodium heart healthy   Complete by:  As directed    Discharge instructions   Complete by:  As directed    You were cared for by a hospitalist during your hospital stay. If you have any questions about your discharge medications or the care you received while you were in the hospital after you are discharged, you can call the unit and ask to speak with the hospitalist on call if the hospitalist that took care of you is not available. Once you are discharged, your primary care physician will handle any further medical issues. Please note that NO REFILLS for any discharge medications will be authorized once you are discharged, as it is imperative that you return to your primary care physician (or establish a relationship with a primary care physician if you do not have one) for your aftercare needs so that they can reassess your need for medications and monitor your lab values.  Increase activity slowly   Complete by:  As directed      Allergies as of 05/15/2018      Reactions   Flexeril [cyclobenzaprine] Swelling   Facial swelling   Tramadol Other (See Comments)   Unknown reaction      Medication List    STOP taking these medications   aspirin 325 MG tablet   docusate sodium 100 MG capsule Commonly known as:  COLACE   methocarbamol 500 MG tablet Commonly known as:  ROBAXIN   ondansetron 4 MG tablet Commonly known as:  ZOFRAN    oxyCODONE 5 MG immediate release tablet Commonly known as:  ROXICODONE     TAKE these medications   amLODipine 10 MG tablet Commonly known as:  NORVASC Take 1 tablet (10 mg total) by mouth daily.   ARTIFICIAL TEARS 1.4 % ophthalmic solution Generic drug:  polyvinyl alcohol Place 1 drop into the left eye daily as needed for dry eyes (irritation).   buprenorphine-naloxone 8-2 mg Subl SL tablet Commonly known as:  SUBOXONE Place 1 tablet under the tongue daily for 3 days. Start taking on:  May 16, 2018   famotidine 20 MG tablet Commonly known as:  PEPCID Take 20 mg by mouth See admin instructions. Take one tablet (20 mg) by mouth every morning, take one tablet (20 mg) at night as needed for acid reflux   ibuprofen 200 MG tablet Commonly known as:  ADVIL,MOTRIN Take 600 mg by mouth every 6 (six) hours as needed (pain).      Follow-up Information    [facility] Follow up on 05/18/2018.   Why:  9:15 am Contact information: [address] [place] [address] [phone]         Allergies  Allergen Reactions  . Flexeril [Cyclobenzaprine] Swelling    Facial swelling  . Tramadol Other (See Comments)    Unknown reaction    Consultations:  PCCM  Internal Medicine Teaching Service for Suboxone Clinic follow up    Procedures/Studies: Dg Chest 1 View  Result Date: 05/10/2018 CLINICAL DATA:  Tachycardia and shortness of breath. EXAM: CHEST  1 VIEW COMPARISON:  Chest x-ray dated December 04, 2013. FINDINGS: The heart size and mediastinal contours are within normal limits. Normal pulmonary vascularity. No focal consolidation, pleural effusion, or pneumothorax. No acute osseous abnormality. IMPRESSION: No active disease. Electronically Signed   By: Obie Dredge M.D.   On: 05/10/2018 16:19   Ct Head Wo Contrast  Result Date: 05/10/2018 CLINICAL DATA:  Altered level of consciousness. EXAM: CT HEAD WITHOUT CONTRAST TECHNIQUE:  Contiguous axial images were obtained from the base of the skull through the vertex without intravenous contrast. COMPARISON:  07/17/2011 FINDINGS: Brain: No acute intracranial abnormality. Specifically, no hemorrhage, hydrocephalus, mass lesion, acute infarction, or significant intracranial injury. Vascular: No hyperdense vessel or unexpected calcification. Skull: No acute calvarial abnormality. Sinuses/Orbits: Mucosal thickening in the ethmoid air cells. No acute finding. Other: None IMPRESSION: No acute intracranial abnormality. Chronic ethmoid sinusitis. Electronically Signed   By: Charlett Nose M.D.   On: 05/10/2018 18:44   Ct Abdomen Pelvis W Contrast  Result Date: 05/10/2018 CLINICAL DATA:  Abdominal pain EXAM: CT ABDOMEN AND PELVIS WITH CONTRAST TECHNIQUE: Multidetector CT imaging of the abdomen and pelvis was performed using the standard protocol following bolus administration of intravenous contrast. CONTRAST:  OMNIPAQUE IOHEXOL 300 MG/ML  SOLN COMPARISON:  None. FINDINGS: Lower chest: Lung bases are clear. No effusions. Heart is normal size. Hepatobiliary: No  focal liver abnormality is seen. Status post cholecystectomy. No biliary dilatation. Pancreas: No focal abnormality or ductal dilatation. Spleen: No focal abnormality.  Normal size. Adrenals/Urinary Tract: No adrenal abnormality. No focal renal abnormality. No stones or hydronephrosis. Urinary bladder is unremarkable. Stomach/Bowel: Stomach, large and small bowel grossly unremarkable. Vascular/Lymphatic: Aorta and iliac vessels heavily calcified. No evidence of aneurysm or adenopathy. Reproductive: Prominent prostate with calcifications. Other: No free fluid or free air. Musculoskeletal: No acute bony abnormality. IMPRESSION: No acute findings in the abdomen or pelvis. Heavily calcified aorta. Mildly prominent prostate. Electronically Signed   By: Charlett NoseKevin  Dover M.D.   On: 05/10/2018 18:48        Discharge Exam: Vitals:   05/15/18  0555 05/15/18 0903  BP: (!) 155/83 (!) 139/91  Pulse: 60 70  Resp: 16 18  Temp: 98.2 F (36.8 C) 98.8 F (37.1 C)  SpO2: 98% 97%    General: Pt is alert, awake Cardiovascular: RRR, S1/S2 +, no rubs, no gallops Respiratory: CTA bilaterally, no wheezing, no rhonchi Abdominal: Soft, NT, ND, bowel sounds + Extremities: no edema, no cyanosis    The results of significant diagnostics from this hospitalization (including imaging, microbiology, ancillary and laboratory) are listed below for reference.     Microbiology: Recent Results (from the past 240 hour(s))  Urine culture     Status: None   Collection Time: 05/10/18  3:44 PM  Result Value Ref Range Status   Specimen Description URINE, RANDOM  Final   Special Requests NONE  Final   Culture   Final    NO GROWTH Performed at [facility], [address] [place], [place] [address]    Report Status 05/11/2018 FINAL  Final  Blood culture (routine x 2)     Status: None   Collection Time: 05/10/18  3:48 PM  Result Value Ref Range Status   Specimen Description BLOOD RIGHT ANTECUBITAL  Final   Special Requests   Final    BOTTLES DRAWN AEROBIC AND ANAEROBIC Blood Culture adequate volume Performed at [facility], [address] [place], [place] [address]    Culture NO GROWTH 5 DAYS  Final   Report Status 05/15/2018 FINAL  Final  CSF culture     Status: None   Collection Time: 05/10/18  3:59 PM  Result Value Ref Range Status   Specimen Description CSF  Final   Special Requests NONE  Final   Gram Stain   Final    CYTOSPIN SMEAR WBC PRESENT, PREDOMINANTLY MONONUCLEAR NO ORGANISMS SEEN Performed at [facility], [address] [place], [place] [address]    Culture NO GROWTH  Final   Report Status 05/14/2018 FINAL  Final  Respiratory Panel by PCR     Status: None   Collection Time: 05/10/18  3:59 PM  Result Value Ref Range Status   Adenovirus NOT DETECTED NOT DETECTED Final   Coronavirus 229E NOT DETECTED  NOT DETECTED Final   Coronavirus HKU1 NOT DETECTED NOT DETECTED Final   Coronavirus NL63 NOT DETECTED NOT DETECTED Final   Coronavirus OC43 NOT DETECTED NOT DETECTED Final   Metapneumovirus NOT DETECTED NOT DETECTED Final   Rhinovirus / Enterovirus NOT DETECTED NOT DETECTED Final   Influenza A NOT DETECTED NOT DETECTED Final   Influenza B NOT DETECTED NOT DETECTED Final   Parainfluenza Virus 1 NOT DETECTED NOT DETECTED Final   Parainfluenza Virus 2 NOT DETECTED NOT DETECTED Final   Parainfluenza Virus 3 NOT DETECTED NOT DETECTED Final  Parainfluenza Virus 4 NOT DETECTED NOT DETECTED Final   Respiratory Syncytial Virus NOT DETECTED NOT DETECTED Final   Bordetella pertussis NOT DETECTED NOT DETECTED Final   Chlamydophila pneumoniae NOT DETECTED NOT DETECTED Final   Mycoplasma pneumoniae NOT DETECTED NOT DETECTED Final    Comment: Performed at [facility], [address] [place], [place] [address]  Blood culture (routine x 2)     Status: None   Collection Time: 05/10/18  4:33 PM  Result Value Ref Range Status   Specimen Description BLOOD RIGHT FOREARM  Final   Special Requests   Final    BOTTLES DRAWN AEROBIC AND ANAEROBIC Blood Culture adequate volume Performed at [facility], [address], [place], [place] [address]    Culture NO GROWTH 5 DAYS  Final   Report Status 05/15/2018 FINAL  Final  MRSA PCR Screening     Status: None   Collection Time: 05/10/18 10:50 PM  Result Value Ref Range Status   MRSA by PCR NEGATIVE NEGATIVE Final    Comment:        The GeneXpert MRSA Assay (FDA approved for NASAL specimens only), is one component of a comprehensive MRSA colonization surveillance program. It is not intended to diagnose MRSA infection nor to guide or monitor treatment for MRSA infections. Performed at [facility], [address] [place], [place] [address]      Labs: BNP (last 3 results) No results for input(s): BNP in the last 8760 hours. Basic  Metabolic Panel: Recent Labs  Lab 05/10/18 1420 05/10/18 1511 05/10/18 2320 05/11/18 1028 05/12/18 0301 05/13/18 0456  NA 139  --  137 138 137 136  K 3.7  --  2.4* 2.8* 2.9* 3.6  CL 98  --  96* 102 105 108  CO2 22  --  25 24 21* 19*  GLUCOSE 161*  --  176* 125* 130* 101*  BUN 10  --  7 9 15 9   CREATININE 1.09  --  1.05 0.86 0.86 0.78  CALCIUM 10.3  --  9.4 8.5* 8.3* 8.6*  MG  --  1.6* 2.0  --   --   --   PHOS  --   --  1.5*  --   --   --    Liver Function Tests: Recent Labs  Lab 05/10/18 1420 05/11/18 1028 05/12/18 0301  AST 29 38 29  ALT 29 25 26   ALKPHOS 102 76 66  BILITOT 1.8* 0.9 0.9  PROT 8.1 6.4* 6.5  ALBUMIN 4.3 3.5 3.3*   Recent Labs  Lab 05/10/18 1420  LIPASE 25   Recent Labs  Lab 05/10/18 1614  AMMONIA 37*   CBC: Recent Labs  Lab 05/10/18 1614 05/10/18 2320 05/11/18 1028 05/13/18 0822 05/14/18 0809 05/15/18 0645  WBC  --  19.9* 24.8* 12.9* 12.1* 11.7*  NEUTROABS 12.7*  --  19.9*  --   --   --   HGB  --  17.2* 15.7 13.9 13.5 13.7  HCT  --  47.4 44.6 39.8 39.0 39.4  MCV  --  82.6 84.2 86.5 87.1 86.2  PLT  --  489* 351 295 292 344   Cardiac Enzymes: Recent Labs  Lab 05/10/18 1614 05/14/18 1500 05/14/18 2142  TROPONINI <0.03 0.07* 0.07*   BNP: Invalid input(s): POCBNP CBG: Recent Labs  Lab 05/10/18 2300  GLUCAP 143*   D-Dimer No results for input(s): DDIMER in the last 72 hours. Hgb A1c No results for input(s): HGBA1C  in the last 72 hours. Lipid Profile No results for input(s): CHOL, HDL, LDLCALC, TRIG, CHOLHDL, LDLDIRECT in the last 72 hours. Thyroid function studies No results for input(s): TSH, T4TOTAL, T3FREE, THYROIDAB in the last 72 hours.  Invalid input(s): FREET3 Anemia work up No results for input(s): VITAMINB12, FOLATE, FERRITIN, TIBC, IRON, RETICCTPCT in the last 72 hours. Urinalysis    Component Value Date/Time   COLORURINE YELLOW 05/10/2018 1537   APPEARANCEUR CLEAR 05/10/2018 1537   LABSPEC 1.021  05/10/2018 1537   PHURINE 9.0 (H) 05/10/2018 1537   GLUCOSEU NEGATIVE 05/10/2018 1537   HGBUR NEGATIVE 05/10/2018 1537   BILIRUBINUR NEGATIVE 05/10/2018 1537   KETONESUR 80 (A) 05/10/2018 1537   PROTEINUR 100 (A) 05/10/2018 1537   UROBILINOGEN 1.0 12/19/2014 0646   NITRITE NEGATIVE 05/10/2018 1537   LEUKOCYTESUR NEGATIVE 05/10/2018 1537   Sepsis Labs Invalid input(s): PROCALCITONIN,  WBC,  LACTICIDVEN Microbiology Recent Results (from the past 240 hour(s))  Urine culture     Status: None   Collection Time: 05/10/18  3:44 PM  Result Value Ref Range Status   Specimen Description URINE, RANDOM  Final   Special Requests NONE  Final   Culture   Final    NO GROWTH Performed at [facility], [address], [place], [place] [address]    Report Status 05/11/2018 FINAL  Final  Blood culture (routine x 2)     Status: None   Collection Time: 05/10/18  3:48 PM  Result Value Ref Range Status   Specimen Description BLOOD RIGHT ANTECUBITAL  Final   Special Requests   Final    BOTTLES DRAWN AEROBIC AND ANAEROBIC Blood Culture adequate volume Performed at [facility], [address], [place], [place] [address]    Culture NO GROWTH 5 DAYS  Final   Report Status 05/15/2018 FINAL  Final  CSF culture     Status: None   Collection Time: 05/10/18  3:59 PM  Result Value Ref Range Status   Specimen Description CSF  Final   Special Requests NONE  Final   Gram Stain   Final    CYTOSPIN SMEAR WBC PRESENT, PREDOMINANTLY MONONUCLEAR NO ORGANISMS SEEN Performed at [facility], [address] [place], [place] [address]    Culture NO GROWTH  Final   Report Status 05/14/2018 FINAL  Final  Respiratory Panel by PCR     Status: None   Collection Time: 05/10/18  3:59 PM  Result Value Ref Range Status   Adenovirus NOT DETECTED NOT DETECTED Final   Coronavirus 229E NOT DETECTED NOT DETECTED Final   Coronavirus HKU1 NOT DETECTED NOT DETECTED Final   Coronavirus NL63 NOT DETECTED NOT  DETECTED Final   Coronavirus OC43 NOT DETECTED NOT DETECTED Final   Metapneumovirus NOT DETECTED NOT DETECTED Final   Rhinovirus / Enterovirus NOT DETECTED NOT DETECTED Final   Influenza A NOT DETECTED NOT DETECTED Final   Influenza B NOT DETECTED NOT DETECTED Final   Parainfluenza Virus 1 NOT DETECTED NOT DETECTED Final   Parainfluenza Virus 2 NOT DETECTED NOT DETECTED Final   Parainfluenza Virus 3 NOT DETECTED NOT DETECTED Final   Parainfluenza Virus 4 NOT DETECTED NOT DETECTED Final   Respiratory Syncytial Virus NOT DETECTED NOT DETECTED Final   Bordetella pertussis NOT DETECTED NOT DETECTED Final   Chlamydophila pneumoniae NOT DETECTED NOT DETECTED Final   Mycoplasma pneumoniae NOT DETECTED NOT DETECTED Final    Comment: Performed at [facility], [address],  [place], [place] [address]  Blood culture (routine x 2)     Status: None   Collection Time: 05/10/18  4:33 PM  Result Value Ref Range Status   Specimen Description BLOOD RIGHT FOREARM  Final   Special Requests   Final    BOTTLES DRAWN AEROBIC AND ANAEROBIC Blood Culture adequate volume Performed at [facility], [address] [place], [place] [address]    Culture NO GROWTH 5 DAYS  Final   Report Status 05/15/2018 FINAL  Final  MRSA PCR Screening     Status: None   Collection Time: 05/10/18 10:50 PM  Result Value Ref Range Status   MRSA by PCR NEGATIVE NEGATIVE Final    Comment:        The GeneXpert MRSA Assay (FDA approved for NASAL specimens only), is one component of a comprehensive MRSA colonization surveillance program. It is not intended to diagnose MRSA infection nor to guide or monitor treatment for MRSA infections. Performed at [facility], [address] [place], [place] [address]      Patient was seen and examined on the day of discharge and was found to be in stable condition. Time coordinating discharge: 25 minutes including assessment and coordination of care, as well as  examination of the patient.   SIGNED:  Noralee StainJennifer Vastie Douty, DO [facility] Pager [phone]  If 7PM-7AM, please contact night-coverage [email or web address] Password [email or web address] 05/15/2018, 9:26 AM

## 2018-05-15 NOTE — Discharge Instructions (Signed)

## 2018-05-18 ENCOUNTER — Other Ambulatory Visit: Payer: Self-pay

## 2018-05-18 ENCOUNTER — Ambulatory Visit (INDEPENDENT_AMBULATORY_CARE_PROVIDER_SITE_OTHER): Payer: Medicaid Other | Admitting: Internal Medicine

## 2018-05-18 VITALS — BP 152/86 | HR 102 | Temp 97.8°F | Ht 66.0 in | Wt 162.1 lb

## 2018-05-18 DIAGNOSIS — F1121 Opioid dependence, in remission: Secondary | ICD-10-CM | POA: Diagnosis not present

## 2018-05-18 DIAGNOSIS — F1123 Opioid dependence with withdrawal: Secondary | ICD-10-CM

## 2018-05-18 DIAGNOSIS — F1193 Opioid use, unspecified with withdrawal: Secondary | ICD-10-CM

## 2018-05-18 MED ORDER — BUPRENORPHINE HCL-NALOXONE HCL 8-2 MG SL SUBL: 1.0000 | SUBLINGUAL_TABLET | Freq: Two times a day (BID) | SUBLINGUAL | 0 refills | Status: DC

## 2018-05-18 MED FILL — BUPRENORPHIN-NALOXON 8-2 MG: 8-2 | 7 days supply | Qty: 14 | Fill #0

## 2018-05-18 NOTE — Assessment & Plan Note (Signed)
Vincent Clarke is doing well.  Vincent Clarke is early in recovery.  Vincent Clarke has a supportive environment.  Vincent Clarke is having cravings and some overnight withdrawal.  Will plan to increase his dose today.   UDS today NCRS is appropriate Increase Buprenorphine to 8-2mg  BID.  I reminded him about the SL dosage of this medication.  We reviewed and signed a medication agreement today Return in 1 week.  HCV Ab was sent for screening.

## 2018-05-18 NOTE — Patient Instructions (Signed)
Vincent Clarke - -  Thank you for coming in today.   Please take buprenorphine twice a day.   Come back to see [place] in 1 week.   Please remember to place under the tongue and let the medication completely dissolve  When it's time to take your dose of medication 1. Split your pill or film in half 2. Make sure your mouth is empty of everything (no candy/gum/etc) 3. Sit or stand, but do not lie down 4. Swallow a sip of water to wet your mouth  5. Put the half of the tablet or film under your tongue. Do not suck or swallow it. It must stay there until it is completely dissolved. Try to not even swallow your spit during this time. Anything that you swallow will not make you feel better.

## 2018-05-18 NOTE — Progress Notes (Signed)
   05/18/2018  Vincent Clarke presents for follow up of opioid use disorder I have reviewed the prior induction visit, follow up visits, and telephone encounters relevant to opiate use disorder (OUD) treatment.   Current daily dose: Buprenorphine 8-2mg  Daily  Date of Induction: May 14, 2018  Current follow up interval, in weeks: 1 week  The patient has been adherent with the buprenorphine for OUD contract.   Last UDS Result: 05/10/18 - + opiates, + cocaine, prior to treatment  HPI: Vincent Clarke is a 53 year old man with Opioid use disorder, here to establish care for treatment.  He was initiated on buprenorphine therapy by Dr. Mikey Bussing in the hospital on 05/14/18.  He is doing well.  He reports increased cravings with the once a day dosing and a wear off effect at night with increased withdrawal symptoms.  His COWs score today is 3, and he did take his buprenorphine this morning.  Good home situation, supportive.  Son 28 at home, clean.  His wife is with him and very supportive.  She is interested in getting seen in clinic as well.  They would like to get clean together.    Exam:   Vitals:   05/18/18 0923  BP: (!) 152/86  Pulse: (!) 102  Temp: 97.8 F (36.6 C)  TempSrc: Oral  SpO2: 100%  Weight: 162 lb 1.6 oz (73.5 kg)  Height: 5\' 6"  (1.676 m)    Physical Exam:  General: Well appearing, no acute distress CV: RR, NR, no murmur Pulm: Breathing comfortably, no wheezing Abd: Soft, +BS Psych: Pleasant, somewhat tearful on exam.   Assessment/Plan:  See Problem Based Charting in the Encounters Tab     Inez Catalina, MD  05/18/2018  9:29 AM

## 2018-05-19 LAB — HSV(HERPES SMPLX VRS)ABS-I+II(IGG)-CSF: HSV Type I/II Ab, IgG CSF: 0.99 IV — ABNORMAL HIGH (ref <=0.89)

## 2018-05-19 NOTE — Addendum Note (Signed)
Addended by: Debe Coder B on: 05/19/2018 05:08 PM   Modules accepted: Orders

## 2018-05-22 LAB — TOXASSURE SELECT,+ANTIDEPR,UR

## 2018-05-25 ENCOUNTER — Ambulatory Visit (INDEPENDENT_AMBULATORY_CARE_PROVIDER_SITE_OTHER): Payer: Medicaid Other | Admitting: Internal Medicine

## 2018-05-25 VITALS — BP 138/78 | HR 81 | Temp 98.4°F | Wt 166.1 lb

## 2018-05-25 DIAGNOSIS — F1121 Opioid dependence, in remission: Secondary | ICD-10-CM

## 2018-05-25 DIAGNOSIS — K219 Gastro-esophageal reflux disease without esophagitis: Secondary | ICD-10-CM

## 2018-05-25 DIAGNOSIS — Z79899 Other long term (current) drug therapy: Secondary | ICD-10-CM

## 2018-05-25 DIAGNOSIS — F1120 Opioid dependence, uncomplicated: Secondary | ICD-10-CM

## 2018-05-25 MED ORDER — BUPRENORPHINE HCL-NALOXONE HCL 8-2 MG SL SUBL: 1.0000 | SUBLINGUAL_TABLET | Freq: Two times a day (BID) | SUBLINGUAL | 0 refills | Status: DC

## 2018-05-25 MED ORDER — PANTOPRAZOLE SODIUM 20 MG PO TBEC: 20.0000 mg | DELAYED_RELEASE_TABLET | Freq: Two times a day (BID) | ORAL | 0 refills | Status: DC

## 2018-05-25 MED FILL — BUPRENORPHIN-NALOXON 8-2 MG: 8-2 | 7 days supply | Qty: 14 | Fill #0

## 2018-05-25 MED FILL — PANTOPRAZOLE SOD DR 20 MG T: 20 | 30 days supply | Qty: 60 | Fill #0

## 2018-05-25 NOTE — Progress Notes (Signed)
   05/25/2018  Vincent Clarke presents for follow up of opioid use disorder I have reviewed the prior induction visit, follow up visits, and telephone encounters relevant to opiate use disorder (OUD) treatment.   Current daily dose: Buprenorphine 16mg  daily  Date of Induction: 05/14/18  Current follow up interval, in weeks: 1  The patient has not been adherent with the buprenorphine for OUD contract.   Last UDS Result: + for buprenorphine, metabolites of amphetamine and cocaine  HPI: Vincent Clarke is a 53 year old man here for treatment of OUD.  He has been feeling more normal since being on the twice daily dosing of buprenorphine.  He has had some trouble sleeping some nights, but this is slowly getting better.  He reports no use of short acting opiates since he was last seen.  He is ready to "get his life back on track" per him and commit to treatment.  Since his admission earlier this month, he has been having dysphagia and feeling of food getting stuck.  He was having pain with all swallowing, but this has improved.  He will now have feeling of food getting stuck every few days and then had to regurgitate the food 1 time int he last week.  He is taking famotidine once a day, sometimes twice and eating soft foods.    Exam:   Vitals:   05/25/18 0955  BP: 138/78  Pulse: 81  Temp: 98.4 F (36.9 C)  TempSrc: Oral  SpO2: 99%  Weight: 166 lb 1.6 oz (75.3 kg)    General: Awake, alert, no acute distress today Eyes: Anicteric sclerae, no injection HENT: Throat is clear CV: No reproducible chest tenderness, RR, NR Pulm: CTAB, no wheezing Skin: Old track mark on left upper arm Psych: Positive, normal mood.   Assessment/Plan:  See Problem Based Charting in the Encounters Tab     Inez Catalina, MD  05/25/2018  10:00 AM

## 2018-05-25 NOTE — Patient Instructions (Signed)
Mr. Mikan - -  I am glad you are doing better today!  Keep taking your buprenorphine as prescribed.   For your swallowing issue, I would like you to do the following  Pick up your prescription for Pantoprazole.  Take this medication 30 minutes before breakfast and 30 minutes before dinner for 1 week.  Also, get some Maalox, or other acid reducing liquid and take this before every meal to coat the lining of your esophagus.    If you are not better by the time you are seen next week, you may need to see a GI doctor.   Please work to get your orange card prior to your next visit as well.   Thank you!

## 2018-05-26 NOTE — Assessment & Plan Note (Signed)
He is getting better with buprenorphine BID.  He reports no use of substances in the last week.  UDS was done today.  He continues to have cravings, but withdrawal is well controlled.  He is due for HCV screening, but does want to wait until he has an orange card which he is in the process of getting prior to getting screened.    Plan Continue Buprenorphine 8-2mg  BID PDMP reviewed and appropriate UDS today Follow up in 1 week.

## 2018-05-26 NOTE — Assessment & Plan Note (Addendum)
He has been having dysphagia, mild reflux symptoms which are improved with famotidine.  This seems to be since his admission.  I have advised him to take a PPI BID for 1 week and maalox or something similar prior to meals for 1 week and see if this starts to improve.  If not improving, he may need to be seen by GI.  DDx includes esophagitis, esophagus injury from recent illness, webbing, esophageal stricture.   Plan Protonix 20mg  BID Maalox prior to meals

## 2018-06-01 ENCOUNTER — Other Ambulatory Visit: Payer: Self-pay

## 2018-06-01 ENCOUNTER — Encounter (HOSPITAL_COMMUNITY): Payer: Self-pay

## 2018-06-01 ENCOUNTER — Emergency Department (HOSPITAL_COMMUNITY): Payer: Medicaid Other

## 2018-06-01 ENCOUNTER — Ambulatory Visit (INDEPENDENT_AMBULATORY_CARE_PROVIDER_SITE_OTHER): Payer: Medicaid Other | Admitting: Student in an Organized Health Care Education/Training Program

## 2018-06-01 ENCOUNTER — Inpatient Hospital Stay (HOSPITAL_COMMUNITY)
Admission: EM | Admit: 2018-06-01 | Discharge: 2018-06-05 | DRG: 391 | Disposition: A | Discharge status: Home / Self Care | Payer: Medicaid Other | Attending: Internal Medicine | Admitting: Internal Medicine

## 2018-06-01 VITALS — BP 142/87 | HR 74 | Temp 98.1°F | Ht 65.0 in | Wt 161.2 lb

## 2018-06-01 DIAGNOSIS — I10 Essential (primary) hypertension: Secondary | ICD-10-CM | POA: Diagnosis present

## 2018-06-01 DIAGNOSIS — F1120 Opioid dependence, uncomplicated: Secondary | ICD-10-CM | POA: Diagnosis present

## 2018-06-01 DIAGNOSIS — R1319 Other dysphagia: Secondary | ICD-10-CM

## 2018-06-01 DIAGNOSIS — F17200 Nicotine dependence, unspecified, uncomplicated: Secondary | ICD-10-CM | POA: Diagnosis present

## 2018-06-01 DIAGNOSIS — R1314 Dysphagia, pharyngoesophageal phase: Principal | ICD-10-CM | POA: Diagnosis present

## 2018-06-01 DIAGNOSIS — K210 Gastro-esophageal reflux disease with esophagitis: Secondary | ICD-10-CM | POA: Diagnosis present

## 2018-06-01 DIAGNOSIS — I739 Peripheral vascular disease, unspecified: Secondary | ICD-10-CM | POA: Diagnosis present

## 2018-06-01 DIAGNOSIS — Z888 Allergy status to other drugs, medicaments and biological substances status: Secondary | ICD-10-CM

## 2018-06-01 DIAGNOSIS — M1990 Unspecified osteoarthritis, unspecified site: Secondary | ICD-10-CM | POA: Diagnosis present

## 2018-06-01 DIAGNOSIS — J45909 Unspecified asthma, uncomplicated: Secondary | ICD-10-CM | POA: Diagnosis present

## 2018-06-01 DIAGNOSIS — R1310 Dysphagia, unspecified: Secondary | ICD-10-CM

## 2018-06-01 DIAGNOSIS — K2211 Ulcer of esophagus with bleeding: Secondary | ICD-10-CM | POA: Diagnosis present

## 2018-06-01 DIAGNOSIS — F1121 Opioid dependence, in remission: Secondary | ICD-10-CM | POA: Diagnosis not present

## 2018-06-01 DIAGNOSIS — M19049 Primary osteoarthritis, unspecified hand: Secondary | ICD-10-CM | POA: Diagnosis present

## 2018-06-01 DIAGNOSIS — Z79899 Other long term (current) drug therapy: Secondary | ICD-10-CM

## 2018-06-01 DIAGNOSIS — K2210 Ulcer of esophagus without bleeding: Secondary | ICD-10-CM

## 2018-06-01 DIAGNOSIS — E876 Hypokalemia: Secondary | ICD-10-CM | POA: Diagnosis present

## 2018-06-01 DIAGNOSIS — F17210 Nicotine dependence, cigarettes, uncomplicated: Secondary | ICD-10-CM | POA: Diagnosis present

## 2018-06-01 DIAGNOSIS — Z9049 Acquired absence of other specified parts of digestive tract: Secondary | ICD-10-CM

## 2018-06-01 DIAGNOSIS — D649 Anemia, unspecified: Secondary | ICD-10-CM | POA: Diagnosis present

## 2018-06-01 LAB — COMPREHENSIVE METABOLIC PANEL
ALT: 27 U/L (ref 0–44)
AST: 24 U/L (ref 15–41)
Albumin: 3.6 g/dL (ref 3.5–5.0)
Alkaline Phosphatase: 89 U/L (ref 38–126)
Anion gap: 11 (ref 5–15)
BUN: 7 mg/dL (ref 6–20)
CO2: 23 mmol/L (ref 22–32)
Calcium: 9.1 mg/dL (ref 8.9–10.3)
Chloride: 103 mmol/L (ref 98–111)
Creatinine, Ser: 0.85 mg/dL (ref 0.61–1.24)
GFR calc Af Amer: >60 mL/min (ref >60)
GFR calc non Af Amer: >60 mL/min (ref >60)
Glucose, Bld: 112 mg/dL — ABNORMAL HIGH (ref 70–99)
Potassium: 4 mmol/L (ref 3.5–5.1)
Sodium: 137 mmol/L (ref 135–145)
Total Bilirubin: 1.4 mg/dL — ABNORMAL HIGH (ref 0.3–1.2)
Total Protein: 6.8 g/dL (ref 6.5–8.1)

## 2018-06-01 LAB — CBC WITH DIFFERENTIAL/PLATELET
Abs Immature Granulocytes: 0.03 10*3/uL (ref 0.00–0.07)
Basophils Absolute: 0.1 10*3/uL (ref 0.0–0.1)
Basophils Relative: 1 %
Eosinophils Absolute: 0.3 10*3/uL (ref 0.0–0.5)
Eosinophils Relative: 2 %
HCT: 40.7 % (ref 39.0–52.0)
Hemoglobin: 14 g/dL (ref 13.0–17.0)
Immature Granulocytes: 0 %
Lymphocytes Relative: 14 %
Lymphs Abs: 1.5 10*3/uL (ref 0.7–4.0)
MCH: 30 pg (ref 26.0–34.0)
MCHC: 34.4 g/dL (ref 30.0–36.0)
MCV: 87.3 fL (ref 80.0–100.0)
Monocytes Absolute: 0.6 10*3/uL (ref 0.1–1.0)
Monocytes Relative: 6 %
Neutro Abs: 8.4 10*3/uL — ABNORMAL HIGH (ref 1.7–7.7)
Neutrophils Relative %: 77 %
Platelets: 327 10*3/uL (ref 150–400)
RBC: 4.66 MIL/uL (ref 4.22–5.81)
RDW: 12.8 % (ref 11.5–15.5)
WBC: 10.8 10*3/uL — ABNORMAL HIGH (ref 4.0–10.5)
nRBC: 0 % (ref 0.0–0.2)

## 2018-06-01 LAB — TOXASSURE SELECT,+ANTIDEPR,UR

## 2018-06-01 MED ORDER — LACTATED RINGERS IV SOLN
INTRAVENOUS | Status: DC
  Administered 2018-06-01: 18:00:00 via INTRAVENOUS

## 2018-06-01 MED ORDER — ACETAMINOPHEN 325 MG PO TABS: 650.0000 mg | ORAL_TABLET | Freq: Four times a day (QID) | ORAL | Status: DC | PRN

## 2018-06-01 MED ORDER — BUPRENORPHINE HCL-NALOXONE HCL 8-2 MG SL SUBL: 1.0000 | SUBLINGUAL_TABLET | Freq: Two times a day (BID) | SUBLINGUAL | 0 refills | Status: DC

## 2018-06-01 MED ORDER — ALBUTEROL SULFATE (2.5 MG/3ML) 0.083% IN NEBU: 2.5000 mg | INHALATION_SOLUTION | Freq: Once | RESPIRATORY_TRACT | Status: DC

## 2018-06-01 MED ORDER — PANTOPRAZOLE SODIUM 20 MG PO TBEC: 20.0000 mg | DELAYED_RELEASE_TABLET | Freq: Two times a day (BID) | ORAL | Status: DC

## 2018-06-01 MED ORDER — NICOTINE 21 MG/24HR TD PT24
21.0000 mg | MEDICATED_PATCH | Freq: Every day | TRANSDERMAL | Status: DC
  Administered 2018-06-01 – 2018-06-05 (×5): 21 mg via TRANSDERMAL
  Filled 2018-06-01 (×5): qty 1

## 2018-06-01 MED ORDER — SODIUM CHLORIDE 0.9 % IV BOLUS
1000.0000 mL | Freq: Once | INTRAVENOUS | Status: AC
  Administered 2018-06-01: 1000 mL via INTRAVENOUS

## 2018-06-01 MED ORDER — PANTOPRAZOLE SODIUM 40 MG IV SOLR
40.0000 mg | Freq: Once | INTRAVENOUS | Status: AC
  Administered 2018-06-01: 40 mg via INTRAVENOUS
  Filled 2018-06-01: qty 40

## 2018-06-01 MED ORDER — ENOXAPARIN SODIUM 40 MG/0.4ML ~~LOC~~ SOLN
40.0000 mg | SUBCUTANEOUS | Status: DC
  Administered 2018-06-02 – 2018-06-05 (×4): 40 mg via SUBCUTANEOUS
  Filled 2018-06-01 (×4): qty 0.4

## 2018-06-01 MED ORDER — BUPRENORPHINE HCL-NALOXONE HCL 8-2 MG SL SUBL
1.0000 | SUBLINGUAL_TABLET | Freq: Two times a day (BID) | SUBLINGUAL | Status: DC
  Administered 2018-06-01 – 2018-06-05 (×8): 1 via SUBLINGUAL
  Filled 2018-06-01 (×8): qty 1

## 2018-06-01 MED ORDER — AMLODIPINE BESYLATE 10 MG PO TABS
10.0000 mg | ORAL_TABLET | Freq: Every day | ORAL | Status: DC
  Administered 2018-06-01 – 2018-06-04 (×4): 10 mg via ORAL
  Filled 2018-06-01 (×4): qty 1

## 2018-06-01 MED FILL — BUPRENORPHIN-NALOXON 8-2 MG: 8-2 | 7 days supply | Qty: 14 | Fill #0

## 2018-06-01 NOTE — Assessment & Plan Note (Addendum)
Severe dysphasia present for the last several days.  Currently intolerant to solids, liquids, and oral secretions.  He has lost 5 pounds in the last 1 week.  Seems like a high risk situation, consistent with a high-grade esophageal obstruction.  Differential includes esophageal stricture, web, esophageal mass, or achalasia.  I think work-up would include modified barium swallow, GI consultation, probably EGD depending on the results of the barium swallow.  Because he is intolerant to liquids, high risk for dehydration, and have asked him to go to the emergency department after our visit today.  I do not think this is related to the Suboxone or opioid use disorder.

## 2018-06-01 NOTE — Progress Notes (Signed)
Modified Barium Swallow Progress Note  Patient Details  Name: Vincent Clarke MRN: [id number] Date of Birth: [birth year]  Today's Date: 06/01/2018  Modified Barium Swallow completed.  Full report located under Chart Review in the Imaging Section.  Brief recommendations include the following:  Clinical Impression  Pt's oropharyngeal swallow appeared grossly functional with concern for a primary esophageal dysphagia. Study was limited to a single cup-sip of thin liquid, which transited swiftly through the oral cavity and pharynx leaving behind only minimal residue in the pyriform sinuses, suspect due to incomplete entrance from possible esophageal component. Pt began coughing almost immediately post-swallow and regurgitated small amounts of thin barium. Upon esophageal sweep, all of the remaining barium appeared to remain in the esophagus, even after several minutes had passed (MD not present to confirm). SLP called MD, who then ordered esophagram. Would recommend esophageal w/u as a primary source for pt's symptoms. After esophageal w/u and possible intervention if he continues to exhibit signs of dysphagia, would re-order SLP. Will sign off for now.   Swallow Evaluation Recommendations   Recommended Consults: Consider esophageal assessment;Consider GI evaluation   SLP Diet Recommendations: Other (Comment)(will defer to MD after esophageal w/u)     Oral Care Recommendations: Oral care BID   Maxcine Ham 06/01/2018,2:40 PM   Maxcine Ham, M.A. CCC-SLP Acute Herbalist [phone] Office [phone]

## 2018-06-01 NOTE — ED Provider Notes (Signed)
Patient signed out to me from Dr. Juleen China.  He was sent in from the clinic for what sounds like a couple of weeks of progressive dysphasia.  At this point he was unable to swallow liquids and was having some difficulty with his own secretions.  Here he had an abnormal esophagram and was evaluated by Dr. Elnoria Howard from GI.  Sign out to me is to review this with the hospitalist service for admission.  Plan is for endoscopy tomorrow.  Contacted hospitalist and they have accepted for admission.        Terrilee Files, MD 06/02/18 1256
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
## 2018-06-01 NOTE — Assessment & Plan Note (Addendum)
Severe opioid use disorder, responding very well to treatment with Suboxone.  Last heroin use was almost a month ago.  Plan is to continue with generic Suboxone 16 mg daily.  Follow-up with [place] in 1 week.  We need to figure out what is going on with this dysphasia more urgently.  Thankfully he still has been able to use the Suboxone sublingually. We reviewed the last results of UDS

## 2018-06-01 NOTE — ED Triage Notes (Signed)
Pt states he was recently here for opiate detox. He states since he was discharged he has been unable to eat due to pain and feels as though he cannot swallow. Maintaining secretions. Voice clear. Denies food bolus.

## 2018-06-01 NOTE — Consult Note (Addendum)
[facility] Elnoria HowardHung and Dr. Loreta AveMann  HPI [name] is a 53 y.o m with opioid use disorder, htn, gerd who presented with odynophagia and dysphagia for the past week. The patient [facility]18 for opioid withdrawal. The patient states that he started noticing that he felt his throat was "sore" when he was in the hospital and his symptoms progressively got worse. He mentions that he has been having pain when he swallows both solid food and liquids. He feels that food is getting stuck in his throat. Over the past day he has been having difficulty swallowing his own saliva as well. He has therefore refrained from eating anything over the past 2 days.  Patient feels that his symptoms are different from how he feels when he has acid reflux. He has never had pain like this before. He has lost 5lbs in past week. Denies any nausea, vomiting, abdominal pain over the past week.  He does not have any prior EGD on record.  Objective   Vitals:   06/01/18 1206 06/01/18 1515  BP: (!) 137/107 111/76  Pulse: 78 64  Resp: 18   Temp: 98.4 F (36.9 C)   SpO2: 99% 95%    Physical Exam  Constitutional: He appears well-developed and well-nourished. No distress.  HENT:  Head: Normocephalic and atraumatic.  Eyes: Conjunctivae are normal.  Cardiovascular: Normal rate, regular rhythm and normal heart sounds.  Respiratory: Effort normal and breath sounds normal. No respiratory distress. He has no wheezes. He exhibits no tenderness.  GI: Soft. Bowel sounds are normal. He exhibits no distension. There is no abdominal tenderness.  Musculoskeletal:        General: No edema.  Neurological: He is alert.  Skin: He is not diaphoretic. No erythema.  Psychiatric: He has a normal mood and affect. His behavior is normal. Judgment and thought content normal.   Assessment and plan   53 y.o m with gerd and oud who presents with 1 week history of odynophagia, dysphagia, 5 lb  weight loss. Esophogram found thickening in mid-distal esophagus.  Esophageal Odynophagia Modified barium swallow that showed concern for primary esophageal dysphagia which was followed by esophogram that showed 2 cm area of focal irregular wall thickening and narrowing of  Mid-distal esophagus.   Will do a EGD 06/02/18 to assess for possible esophageal stricture vs achalasia.   -clears now and npo at midnight  -maintenance fluids LR 1400ml/hr for 10hrs  GERD  -continue home pantoprazole 20mg  bid  OUD Patient reports last opiate use was prior to his last hospitalization.   -Continue suboxone 16mg  qd   Vincent CourierVahini Chundi, MD Internal Medicine PGY2 Pager:[phone] 06/01/2018, 4:47 PM  ATTENDING NOTE  The patient was evaluated with the resident and I agree with the assessment and plan.  This is a 53 year old male with complaints of odynophagia and dysphagia.  His symptoms actually started during this last admission for substance abuse.  It was not as severe, but his symptoms progressively worsened.  He feels that any PO intake will incite pain and then it reaches his mid chest with subsequent vomiting.  There are no reports of hematemesis or coffee-ground emesis.  He has a history of GERD and he does take antacids, however, he does note feel that his current symptoms are consistent with GERD.  An esophagram was performed and there is evidence of a 2 cm stricture in the mid esophagus.  He is a smoker, but he does not  drink any ETOH.  Plan: 1) EGD tomorrow for further evaluation.

## 2018-06-01 NOTE — Progress Notes (Signed)
   06/01/2018  Vincent Clarke presents for follow up of opioid use disorder I have reviewed the prior induction visit, follow up visits, and telephone encounters relevant to opiate use disorder (OUD) treatment.   Current daily dose: Suboxone 16 mg daily  Date of Induction: 05/14/18  Current follow up interval, in weeks: 1  The patient has been adherent with the buprenorphine for OUD contract.   Last UDS Result: Suboxone present, no heroin, methamphetamine and cocaine present  HPI: 53 year old man here for follow-up of opioid use disorder, which is going very well.  He reports good compliance with Suboxone every day.  Reports last heroin use was in December prior to his admission for withdrawal.  He has an acute complaint today, over the last 1 week he has had progressive feeling of difficulty swallowing.  Started with unable to swallow solids, felt like it would get stuck around the upper third of his chest.  He progressed and now is intolerant to all foods, even soft foods, intolerant to liquids, also feels like he is spitting up his secretions.  He has had some vomiting, no blood.  This is never happened to him before.  Last admission to the hospital in December was for withdrawal and he did have some active vomiting during that admission.  But has never had a problem with esophageal impaction.  Does not have a GI physician, never had an EGD.  Says he has not drinking any water in 2 days.  He is feeling dehydrated.  Wife is with him and she is worried about him.  Exam:   Vitals:   06/01/18 1000  BP: (!) 142/87  Pulse: 74  Temp: 98.1 F (36.7 C)  TempSrc: Oral  SpO2: 100%  Weight: 161 lb 3.2 oz (73.1 kg)  Height: 5\' 5"  (1.651 m)    General: Anxious appearing, no distress Mouth: Dentures, no lesions or erythema Neck: No cervical lymphadenopathy, no thyromegaly CV: Regular rate and rhythm with no murmurs Lungs: Clear throughout Neuro: Alert, conversational, normal strength throughout,  normal gait  Assessment/Plan:  See Problem Based Charting in the Encounters Tab    Vincent Alias, MD  06/01/2018  10:34 AM

## 2018-06-01 NOTE — Patient Instructions (Signed)
Continue treatments for the opioid use disorder as you are currently doing.  I am worried about the problem swallowing that you described today.  I think the most reasonable treatment would be to go to the emergency department.  I think he should have a test called a modified barium swallow.  Based on the results of that would talk with the GI physicians and you may need to have a endoscopy to look at your esophagus and see what is causing the blockage.   We will plan to see you back in the clinic in 1 week, can reschedule that depending on how things go in the hospital.

## 2018-06-02 ENCOUNTER — Encounter (HOSPITAL_COMMUNITY): Payer: Self-pay | Admitting: *Deleted

## 2018-06-02 ENCOUNTER — Encounter (HOSPITAL_COMMUNITY)
Admission: EM | Disposition: A | Discharge status: Home / Self Care | Payer: Self-pay | Source: Home / Self Care | Attending: Internal Medicine

## 2018-06-02 ENCOUNTER — Other Ambulatory Visit: Payer: Self-pay

## 2018-06-02 ENCOUNTER — Observation Stay (HOSPITAL_COMMUNITY): Payer: Medicaid Other | Admitting: Anesthesiology

## 2018-06-02 DIAGNOSIS — F17200 Nicotine dependence, unspecified, uncomplicated: Secondary | ICD-10-CM | POA: Diagnosis not present

## 2018-06-02 DIAGNOSIS — I10 Essential (primary) hypertension: Secondary | ICD-10-CM | POA: Diagnosis not present

## 2018-06-02 DIAGNOSIS — F1121 Opioid dependence, in remission: Secondary | ICD-10-CM | POA: Diagnosis not present

## 2018-06-02 DIAGNOSIS — R1310 Dysphagia, unspecified: Secondary | ICD-10-CM

## 2018-06-02 HISTORY — PX: BIOPSY: SHX5522

## 2018-06-02 HISTORY — PX: ESOPHAGOGASTRODUODENOSCOPY (EGD) WITH PROPOFOL: SHX5813

## 2018-06-02 LAB — TROPONIN I
Troponin I: <0.03 ng/mL (ref <0.03)
Troponin I: <0.03 ng/mL (ref <0.03)
Troponin I: <0.03 ng/mL (ref <0.03)

## 2018-06-02 LAB — BASIC METABOLIC PANEL
Anion gap: 7 (ref 5–15)
BUN: <5 mg/dL — ABNORMAL LOW (ref 6–20)
CO2: 25 mmol/L (ref 22–32)
Calcium: 8.8 mg/dL — ABNORMAL LOW (ref 8.9–10.3)
Chloride: 105 mmol/L (ref 98–111)
Creatinine, Ser: 0.87 mg/dL (ref 0.61–1.24)
GFR calc Af Amer: >60 mL/min (ref >60)
GFR calc non Af Amer: >60 mL/min (ref >60)
Glucose, Bld: 144 mg/dL — ABNORMAL HIGH (ref 70–99)
Potassium: 3.3 mmol/L — ABNORMAL LOW (ref 3.5–5.1)
Sodium: 137 mmol/L (ref 135–145)

## 2018-06-02 LAB — MAGNESIUM: Magnesium: 1.9 mg/dL (ref 1.7–2.4)

## 2018-06-02 LAB — CBC
HCT: 36.6 % — ABNORMAL LOW (ref 39.0–52.0)
Hemoglobin: 12.2 g/dL — ABNORMAL LOW (ref 13.0–17.0)
MCH: 29.3 pg (ref 26.0–34.0)
MCHC: 33.3 g/dL (ref 30.0–36.0)
MCV: 88 fL (ref 80.0–100.0)
Platelets: 276 10*3/uL (ref 150–400)
RBC: 4.16 MIL/uL — ABNORMAL LOW (ref 4.22–5.81)
RDW: 12.6 % (ref 11.5–15.5)
WBC: 7 10*3/uL (ref 4.0–10.5)
nRBC: 0 % (ref 0.0–0.2)

## 2018-06-02 SURGERY — ESOPHAGOGASTRODUODENOSCOPY (EGD) WITH PROPOFOL
Anesthesia: Monitor Anesthesia Care

## 2018-06-02 MED ORDER — POTASSIUM CHLORIDE 10 MEQ/100ML IV SOLN
10.0000 meq | INTRAVENOUS | Status: AC
  Administered 2018-06-02 (×3): 10 meq via INTRAVENOUS
  Filled 2018-06-02 (×3): qty 100

## 2018-06-02 MED ORDER — MORPHINE SULFATE (PF) 4 MG/ML IV SOLN
4.0000 mg | INTRAVENOUS | Status: AC
  Administered 2018-06-02: 4 mg via INTRAVENOUS

## 2018-06-02 MED ORDER — ONDANSETRON HCL 4 MG/2ML IJ SOLN: 4.0000 mg | Freq: Four times a day (QID) | INTRAMUSCULAR | Status: DC | PRN

## 2018-06-02 MED ORDER — PROPOFOL 10 MG/ML IV BOLUS
INTRAVENOUS | Status: DC | PRN
  Administered 2018-06-02: 20 mg via INTRAVENOUS
  Administered 2018-06-02: 10 mg via INTRAVENOUS
  Administered 2018-06-02: 20 mg via INTRAVENOUS

## 2018-06-02 MED ORDER — SUCRALFATE 1 GM/10ML PO SUSP
1.0000 g | Freq: Three times a day (TID) | ORAL | Status: DC
  Administered 2018-06-02 – 2018-06-05 (×9): 1 g via ORAL
  Filled 2018-06-02 (×9): qty 10

## 2018-06-02 MED ORDER — PANTOPRAZOLE SODIUM 40 MG PO PACK
40.0000 mg | PACK | Freq: Two times a day (BID) | ORAL | Status: DC
  Administered 2018-06-02 – 2018-06-05 (×6): 40 mg via ORAL
  Filled 2018-06-02 (×6): qty 20

## 2018-06-02 MED ORDER — SUCRALFATE 1 G PO TABS
1.0000 g | ORAL_TABLET | Freq: Three times a day (TID) | ORAL | Status: DC
  Filled 2018-06-02: qty 1

## 2018-06-02 MED ORDER — ONDANSETRON HCL 4 MG/2ML IJ SOLN
4.0000 mg | INTRAMUSCULAR | Status: AC
  Administered 2018-06-02: 4 mg via INTRAVENOUS
  Filled 2018-06-02: qty 2

## 2018-06-02 MED ORDER — ASPIRIN 325 MG PO TABS
325.0000 mg | ORAL_TABLET | Freq: Once | ORAL | Status: AC
  Administered 2018-06-02: 325 mg via ORAL

## 2018-06-02 MED ORDER — PROPOFOL 500 MG/50ML IV EMUL
INTRAVENOUS | Status: DC | PRN
  Administered 2018-06-02: 75 ug/kg/min via INTRAVENOUS

## 2018-06-02 MED ORDER — SODIUM CHLORIDE 0.9 % IV SOLN
INTRAVENOUS | Status: DC
  Administered 2018-06-02: 07:00:00 via INTRAVENOUS

## 2018-06-02 SURGICAL SUPPLY — 14 items
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
## 2018-06-02 NOTE — Transfer of Care (Signed)
Immediate Anesthesia Transfer of Care Note  Patient: Vincent Clarke  Procedure(s) Performed: ESOPHAGOGASTRODUODENOSCOPY (EGD) WITH PROPOFOL (N/A ) BIOPSY  Patient Location: Endoscopy Unit  Anesthesia Type:MAC  Level of Consciousness: drowsy and patient cooperative  Airway & Oxygen Therapy: Patient Spontanous Breathing and Patient connected to nasal cannula oxygen  Post-op Assessment: Report given to RN, Post -op Vital signs reviewed and stable and Patient moving all extremities X 4  Post vital signs: Reviewed and stable  Last Vitals:  Vitals Value Taken Time  BP    Temp    Pulse    Resp    SpO2      Last Pain:  Vitals:   06/02/18 1348  TempSrc: Oral  PainSc: 0-No pain         Complications: No apparent anesthesia complications

## 2018-06-02 NOTE — Anesthesia Postprocedure Evaluation (Signed)
Anesthesia Post Note  Patient: Vincent Clarke  Procedure(s) Performed: ESOPHAGOGASTRODUODENOSCOPY (EGD) WITH PROPOFOL (N/A ) BIOPSY     Patient location during evaluation: PACU Anesthesia Type: MAC Level of consciousness: awake and alert Pain management: pain level controlled Vital Signs Assessment: post-procedure vital signs reviewed and stable Respiratory status: spontaneous breathing, nonlabored ventilation, respiratory function stable and patient connected to nasal cannula oxygen Cardiovascular status: stable and blood pressure returned to baseline Postop Assessment: no apparent nausea or vomiting Anesthetic complications: no    Last Vitals:  Vitals:   06/02/18 1540 06/02/18 1601  BP: 97/67 124/76  Pulse: (!) 55 (!) 53  Resp: 16 16  Temp:  36.7 C  SpO2: 93% 99%    Last Pain:  Vitals:   06/02/18 1601  TempSrc: Oral  PainSc:                  Effie Berkshire

## 2018-06-02 NOTE — Anesthesia Preprocedure Evaluation (Signed)
Anesthesia Evaluation  Patient identified by MRN, date of birth, ID band Patient awake    Reviewed: Allergy & Precautions, NPO status , Patient's Chart, lab work & pertinent test results  Airway Mallampati: II  TM Distance: >3 FB Neck ROM: Full    Dental  (+) Dental Advisory Given, Edentulous Lower, Edentulous Upper   Pulmonary asthma , Current Smoker,    Pulmonary exam normal breath sounds clear to auscultation       Cardiovascular hypertension, Pt. on medications + Peripheral Vascular Disease  Normal cardiovascular exam Rhythm:Regular Rate:Normal     Neuro/Psych negative neurological ROS     GI/Hepatic GERD  Medicated,(+)     substance abuse  , Esophageal stricture   Endo/Other  negative endocrine ROS  Renal/GU negative Renal ROS     Musculoskeletal  (+) Arthritis , narcotic dependent  Abdominal   Peds  Hematology  (+) Blood dyscrasia, anemia ,   Anesthesia Other Findings Day of surgery medications reviewed with the patient.  Reproductive/Obstetrics                             Anesthesia Physical Anesthesia Plan  ASA: III  Anesthesia Plan: MAC   Post-op Pain Management:    Induction: Intravenous  PONV Risk Score and Plan: 0 and Propofol infusion and Treatment may vary due to age or medical condition  Airway Management Planned: Nasal Cannula and Natural Airway  Additional Equipment:   Intra-op Plan:   Post-operative Plan:   Informed Consent: I have reviewed the patients History and Physical, chart, labs and discussed the procedure including the risks, benefits and alternatives for the proposed anesthesia with the patient or authorized representative who has indicated his/her understanding and acceptance.     Dental advisory given  Plan Discussed with: CRNA and Anesthesiologist  Anesthesia Plan Comments:         Anesthesia Quick Evaluation

## 2018-06-02 NOTE — Progress Notes (Signed)
Pt called out for acute rt sided chest pain 10/10.  Diaphoretic, nauseous and high bp.  EKG performed.  Dr. Katrinka Blazing paged.  Pt given aspirin, morphine and zofran.  Pt pain has resolved to 6/10.  Says it is easing off.

## 2018-06-02 NOTE — Op Note (Signed)
[facility] Name: Vincent BoardsFred Clarke Procedure Date : 06/02/2018 MRN: [id number] Attending MD: Charna ElizabethJyothi Niyati Heinke , MD Date of Birth: 05/12/1965 CSN: [id number] Age: 53 Admit Type: Inpatient Procedure:                EGD with esophageal biopsies. Indications:              Dysphagia, Gastro-esophageal reflux disease. Providers:                Charna ElizabethJyothi Dereck Agerton, MD, Leslie DalesLaura Archer, RN, Verita SchneidersPrasun Sharma,                            Technician, Kirstie MirzaSamantha Turner, CRNA. Referring MD:             THP Medicines:                Monitored Anesthesia Care Complications:            No immediate complications. Estimated Blood Loss:     Estimated blood loss was minimal. Procedure:                Pre-Anesthesia Assessment: - Prior to the                            procedure, a history and physical was performed,                            and patient medications and allergies were                            reviewed. The patient's tolerance of previous                            anesthesia was also reviewed. The risks and                            benefits of the procedure and the sedation options                            and risks were discussed with the patient. All                            questions were answered, and informed consent was                            obtained. Prior Anticoagulants: The patient has                            taken no previous anticoagulant or antiplatelet                            agents. After reviewing the risks and benefits, the                            patient was deemed in satisfactory condition to  undergo the procedure. - ASA Grade Assessment: III                            - A patient with severe systemic disease. After                            obtaining informed consent, the endoscope was                            passed under direct vision. Throughout the                            procedure, the patient's blood pressure,  pulse, and                            oxygen saturations were monitored continuously. The                            [id number]-[id number] ([id number] Olympus gastroscope was                            introduced through the mouth, and advanced to the                            second part of duodenum. Scope In: Scope Out: Findings:      LA Grade D (one or more mucosal breaks involving at least 75% of       esophageal circumference) esophagitis with bleeding was found 25 cm-40       cm; slight sticrturing at 25 cm-adult scope chhanged to a neonatal       gastroscope. The stricture was traversed awith the neonatal       gastroscope-biopsies done.      The entire examined stomach was normal.      The cardia and gastric fundus were normal on retroflexion.      The examined duodenum was normal. Impression:               - LA Grade D reflux esophagitis from 25-40                            cm-biopsies done.                           - Normal stomach.                           - Normal examined duodenum. Recommendation:           - Clear liquid diet.                           - Await pathology results.                           - Use Protonix (Pantoprazole) 40 mg PO BID.                           -  Sucralfate 1 gm QID PO. Procedure Code(s):        --- Professional ---                           [phone], Esophagogastroduodenoscopy, flexible,                            transoral; with biopsy, single or multiple Diagnosis Code(s):        --- Professional ---                           R13.10, Dysphagia, unspecified                           K21.0, Gastro-esophageal reflux disease with                            esophagitis CPT copyright 2018 American Medical Association. All rights reserved. The codes documented in this report are preliminary and upon coder review may  be revised to meet current compliance requirements. Charna Elizabeth, MD Charna Elizabeth, MD 06/02/2018 3:57:17 PM This report has been signed  electronically. Number of Addenda: 0

## 2018-06-03 DIAGNOSIS — Z888 Allergy status to other drugs, medicaments and biological substances status: Secondary | ICD-10-CM | POA: Diagnosis not present

## 2018-06-03 DIAGNOSIS — Z9049 Acquired absence of other specified parts of digestive tract: Secondary | ICD-10-CM | POA: Diagnosis not present

## 2018-06-03 DIAGNOSIS — I739 Peripheral vascular disease, unspecified: Secondary | ICD-10-CM | POA: Diagnosis present

## 2018-06-03 DIAGNOSIS — K2211 Ulcer of esophagus with bleeding: Secondary | ICD-10-CM | POA: Diagnosis present

## 2018-06-03 DIAGNOSIS — M1990 Unspecified osteoarthritis, unspecified site: Secondary | ICD-10-CM | POA: Diagnosis present

## 2018-06-03 DIAGNOSIS — F17210 Nicotine dependence, cigarettes, uncomplicated: Secondary | ICD-10-CM | POA: Diagnosis present

## 2018-06-03 DIAGNOSIS — K209 Esophagitis, unspecified: Secondary | ICD-10-CM

## 2018-06-03 DIAGNOSIS — R1310 Dysphagia, unspecified: Secondary | ICD-10-CM | POA: Diagnosis not present

## 2018-06-03 DIAGNOSIS — K2210 Ulcer of esophagus without bleeding: Secondary | ICD-10-CM | POA: Diagnosis not present

## 2018-06-03 DIAGNOSIS — F17200 Nicotine dependence, unspecified, uncomplicated: Secondary | ICD-10-CM | POA: Diagnosis not present

## 2018-06-03 DIAGNOSIS — K210 Gastro-esophageal reflux disease with esophagitis: Secondary | ICD-10-CM | POA: Diagnosis present

## 2018-06-03 DIAGNOSIS — J45909 Unspecified asthma, uncomplicated: Secondary | ICD-10-CM | POA: Diagnosis present

## 2018-06-03 DIAGNOSIS — D649 Anemia, unspecified: Secondary | ICD-10-CM | POA: Diagnosis present

## 2018-06-03 DIAGNOSIS — F1121 Opioid dependence, in remission: Secondary | ICD-10-CM | POA: Diagnosis present

## 2018-06-03 DIAGNOSIS — M19049 Primary osteoarthritis, unspecified hand: Secondary | ICD-10-CM | POA: Diagnosis present

## 2018-06-03 DIAGNOSIS — Z79899 Other long term (current) drug therapy: Secondary | ICD-10-CM | POA: Diagnosis not present

## 2018-06-03 DIAGNOSIS — I10 Essential (primary) hypertension: Secondary | ICD-10-CM | POA: Diagnosis present

## 2018-06-03 DIAGNOSIS — R1314 Dysphagia, pharyngoesophageal phase: Secondary | ICD-10-CM | POA: Diagnosis present

## 2018-06-03 DIAGNOSIS — E876 Hypokalemia: Secondary | ICD-10-CM | POA: Diagnosis present

## 2018-06-03 LAB — BASIC METABOLIC PANEL
Anion gap: 7 (ref 5–15)
BUN: <5 mg/dL — ABNORMAL LOW (ref 6–20)
CO2: 25 mmol/L (ref 22–32)
Calcium: 8.4 mg/dL — ABNORMAL LOW (ref 8.9–10.3)
Chloride: 107 mmol/L (ref 98–111)
Creatinine, Ser: 0.75 mg/dL (ref 0.61–1.24)
GFR calc Af Amer: >60 mL/min (ref >60)
GFR calc non Af Amer: >60 mL/min (ref >60)
Glucose, Bld: 97 mg/dL (ref 70–99)
Potassium: 3.3 mmol/L — ABNORMAL LOW (ref 3.5–5.1)
Sodium: 139 mmol/L (ref 135–145)

## 2018-06-03 MED ORDER — PHENOL 1.4 % MT LIQD
1.0000 | OROMUCOSAL | Status: DC | PRN
  Administered 2018-06-05: 1 via OROMUCOSAL
  Filled 2018-06-03: qty 177

## 2018-06-03 MED ORDER — SODIUM CHLORIDE 0.9 % IV SOLN
INTRAVENOUS | Status: DC
  Administered 2018-06-03 – 2018-06-04 (×2): via INTRAVENOUS

## 2018-06-03 NOTE — Progress Notes (Signed)
Patient transferred to [facility] room 01. Oriented to room. Call bell in reach. Denies any pain. Reviewed diet limitations. Patient reports eating few bites of mashed potatoes with gravy this afternoon prior to transport.

## 2018-06-03 NOTE — Plan of Care (Signed)
  Problem: Coping: Goal: Level of anxiety will decrease Outcome: Progressing   Problem: Pain Managment: Goal: General experience of comfort will improve Outcome: Progressing   Problem: Safety: Goal: Ability to remain free from injury will improve Outcome: Progressing   

## 2018-06-03 NOTE — Progress Notes (Signed)
Report called to Alona Bene, RN on 6N.
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
## 2018-06-04 LAB — FERRITIN: Ferritin: 63 ng/mL (ref 24–336)

## 2018-06-04 LAB — IRON AND TIBC
Iron: 47 ug/dL (ref 45–182)
Saturation Ratios: 14 % — ABNORMAL LOW (ref 17.9–39.5)
TIBC: 343 ug/dL (ref 250–450)
UIBC: 296 ug/dL

## 2018-06-04 LAB — RETICULOCYTES
Immature Retic Fract: 7.2 % (ref 2.3–15.9)
RBC.: 4.23 MIL/uL (ref 4.22–5.81)
Retic Count, Absolute: 52 10*3/uL (ref 19.0–186.0)
Retic Ct Pct: 1.2 % (ref 0.4–3.1)

## 2018-06-04 LAB — HIV ANTIBODY (ROUTINE TESTING W REFLEX): HIV Screen 4th Generation wRfx: NONREACTIVE

## 2018-06-04 LAB — FOLATE: Folate: 9.7 ng/mL (ref >5.9)

## 2018-06-04 MED ORDER — POTASSIUM CHLORIDE 20 MEQ/15ML (10%) PO SOLN
40.0000 meq | ORAL | Status: AC
  Administered 2018-06-04 (×2): 40 meq via ORAL
  Filled 2018-06-04 (×2): qty 30

## 2018-06-04 MED ORDER — ENSURE ENLIVE PO LIQD
237.0000 mL | Freq: Three times a day (TID) | ORAL | Status: DC
  Administered 2018-06-04 – 2018-06-05 (×3): 237 mL via ORAL

## 2018-06-04 MED ORDER — ADULT MULTIVITAMIN W/MINERALS CH
1.0000 | ORAL_TABLET | Freq: Every day | ORAL | Status: DC
  Administered 2018-06-04 – 2018-06-05 (×2): 1 via ORAL
  Filled 2018-06-04 (×2): qty 1

## 2018-06-04 MED ORDER — MENTHOL 3 MG MT LOZG
1.0000 | LOZENGE | OROMUCOSAL | Status: DC | PRN
  Administered 2018-06-05: 3 mg via ORAL
  Filled 2018-06-04: qty 9

## 2018-06-04 NOTE — Progress Notes (Signed)
PROGRESS NOTE    Vincent Clarke   DUK:[id number]  DOB: [birth year]  DOA: 06/01/2018 PCP: Kaleen Mask, MD   Brief Narrative:  Vincent Clarke  is a 53 y.o. male with medical history significant of OUD, HTN who presents for dysphagia.  Vincent Clarke has had dysphagia since his admission earlier this month.  This has progressed from solids to liquids and at times his own spit now.  It was initially felt that this was due to severe vomiting that he had during his hospitalization.  He was treated with protonix and ranitidine, but symptoms progressed and he has lost 5 lbs.   He was last admitted from 12/30 to 1/4 for acute encephalopathy thought to be due to opoid withdrawl   Subjective: Vomiting up his meds this AM. Otherwise was tolerating full liquids and meds yesterday.     Assessment & Plan:   Principal Problem:   Dysphagia and esophageal odynophagia due to SEVERE esophagitis - Barium esophagram revealed 2 cm area of focal irregular wall thickening in the mid distal esophagus.   - EGD> 1/22- severe esophagitis with bleeding - cont BID Protonix and Sucralfate- cont full liquids and liquid meds - biopsies done  Active Problems: Anemia - follow intermittently- check Iron panel  Hypokalemia - replacing    HTN (hypertension)? - SBP being measure at 10-120 - discontinue Amlodipine     Tobacco use disorder - Nicotine patch    Opioid use disorder - Suboxone BID  Time spent in minutes: 35 DVT prophylaxis: Lovenox Code Status: Full code Family Communication:  Disposition Plan: home in 1-2 days Consultants:   GI Procedures:  EGD  LA Grade D reflux esophagitis from 25-40                            cm-biopsies done.                           - Normal stomach.                           - Normal examined duodenum. Antimicrobials:  Anti-infectives (From admission, onward)   None       Objective: Vitals:   06/03/18 1635 06/03/18 2034 06/04/18 0539 06/04/18 0823    BP: 118/78 112/74 116/72 130/73  Pulse: 72 66 (!) 51   Resp:  17 16   Temp: 98.2 F (36.8 C) 97.9 F (36.6 C) 97.8 F (36.6 C)   TempSrc: Oral Oral Oral   SpO2: 100% 99% 97%   Weight:      Height:        Intake/Output Summary (Last 24 hours) at 06/04/2018 1435 Last data filed at 06/04/2018 0930 Gross per 24 hour  Intake 2243.55 ml  Output 1500 ml  Net 743.55 ml   Filed Weights   06/01/18 1941  Weight: 73.2 kg    Examination: General exam: Appears comfortable  HEENT: PERRLA, oral mucosa moist, no sclera icterus or thrush Respiratory system: Clear to auscultation. Respiratory effort normal. Cardiovascular system: S1 & S2 heard, RRR.   Gastrointestinal system: Abdomen soft, non-tender, nondistended. Normal bowel sounds. Central nervous system: Alert and oriented. No focal neurological deficits. Extremities: No cyanosis, clubbing or edema Skin: No rashes or ulcers Psychiatry:  Mood & affect appropriate.     Data Reviewed: I have personally reviewed following labs and imaging studies  CBC: Recent Labs  Lab 06/01/18 1233 06/02/18 0510  WBC 10.8* 7.0  NEUTROABS 8.4*  --   HGB 14.0 12.2*  HCT 40.7 36.6*  MCV 87.3 88.0  PLT 327 276   Basic Metabolic Panel: Recent Labs  Lab 06/01/18 1233 06/02/18 0510 06/02/18 0935 06/03/18 0248  NA 137 137  --  139  K 4.0 3.3*  --  3.3*  CL 103 105  --  107  CO2 23 25  --  25  GLUCOSE 112* 144*  --  97  BUN 7 <5*  --  <5*  CREATININE 0.85 0.87  --  0.75  CALCIUM 9.1 8.8*  --  8.4*  MG  --   --  1.9  --    GFR: Estimated Creatinine Clearance: 92.9 mL/min (by C-G formula based on SCr of 0.75 mg/dL). Liver Function Tests: Recent Labs  Lab 06/01/18 1233  AST 24  ALT 27  ALKPHOS 89  BILITOT 1.4*  PROT 6.8  ALBUMIN 3.6   No results for input(s): LIPASE, AMYLASE in the last 168 hours. No results for input(s): AMMONIA in the last 168 hours. Coagulation Profile: No results for input(s): INR, PROTIME in the last 168  hours. Cardiac Enzymes: Recent Labs  Lab 06/02/18 0935 06/02/18 1611 06/02/18 2035  TROPONINI <0.03 <0.03 <0.03   BNP (last 3 results) No results for input(s): PROBNP in the last 8760 hours. HbA1C: No results for input(s): HGBA1C in the last 72 hours. CBG: No results for input(s): GLUCAP in the last 168 hours. Lipid Profile: No results for input(s): CHOL, HDL, LDLCALC, TRIG, CHOLHDL, LDLDIRECT in the last 72 hours. Thyroid Function Tests: No results for input(s): TSH, T4TOTAL, FREET4, T3FREE, THYROIDAB in the last 72 hours. Anemia Panel: No results for input(s): VITAMINB12, FOLATE, FERRITIN, TIBC, IRON, RETICCTPCT in the last 72 hours. Urine analysis:    Component Value Date/Time   COLORURINE YELLOW 05/10/2018 1537   APPEARANCEUR CLEAR 05/10/2018 1537   LABSPEC 1.021 05/10/2018 1537   PHURINE 9.0 (H) 05/10/2018 1537   GLUCOSEU NEGATIVE 05/10/2018 1537   HGBUR NEGATIVE 05/10/2018 1537   BILIRUBINUR NEGATIVE 05/10/2018 1537   KETONESUR 80 (A) 05/10/2018 1537   PROTEINUR 100 (A) 05/10/2018 1537   UROBILINOGEN 1.0 12/19/2014 0646   NITRITE NEGATIVE 05/10/2018 1537   LEUKOCYTESUR NEGATIVE 05/10/2018 1537   Sepsis Labs: @LABRCNTIP (procalcitonin:4,lacticidven:4) )No results found for this or any previous visit (from the past 240 hour(s)).       Radiology Studies: No results found.    Scheduled Meds: . amLODipine  10 mg Oral Daily  . buprenorphine-naloxone  1 tablet Sublingual BID  . enoxaparin (LOVENOX) injection  40 mg Subcutaneous Q24H  . nicotine  21 mg Transdermal Daily  . pantoprazole sodium  40 mg Oral BID  . sucralfate  1 g Oral TID WC & HS   Continuous Infusions: . sodium chloride 50 mL/hr at 06/04/18 0545     LOS: 1 day      Vincent Cantor, MD [facility] Pager: [email or web address] Password [facility] 06/04/2018, 2:35 PM

## 2018-06-04 NOTE — Plan of Care (Signed)
  Problem: Education: Goal: Knowledge of General Education information will improve Description: Including pain rating scale, medication(s)/side effects and non-pharmacologic comfort measures Outcome: Progressing   Problem: Health Behavior/Discharge Planning: Goal: Ability to manage health-related needs will improve Outcome: Progressing   Problem: Clinical Measurements: Goal: Ability to maintain clinical measurements within normal limits will improve Outcome: Progressing Goal: Will remain free from infection Outcome: Progressing Goal: Diagnostic test results will improve Outcome: Progressing Goal: Cardiovascular complication will be avoided Outcome: Progressing   Problem: Activity: Goal: Risk for activity intolerance will decrease Outcome: Progressing   Problem: Nutrition: Goal: Adequate nutrition will be maintained Outcome: Progressing   Problem: Coping: Goal: Level of anxiety will decrease Outcome: Progressing   Problem: Elimination: Goal: Will not experience complications related to bowel motility Outcome: Progressing Goal: Will not experience complications related to urinary retention Outcome: Progressing   Problem: Pain Managment: Goal: General experience of comfort will improve Outcome: Progressing   Problem: Safety: Goal: Ability to remain free from injury will improve Outcome: Progressing   

## 2018-06-04 NOTE — Progress Notes (Addendum)
Initial Nutrition Assessment  DOCUMENTATION CODES:   Not applicable  INTERVENTION:  Ensure Enlive po TID, each supplement provides 350 kcal and 20 grams of protein  Multivitamin with minerals daily.   NUTRITION DIAGNOSIS:   Inadequate oral intake related to dysphagia as evidenced by estimated needs.  GOAL:   Patient will meet greater than or equal to 90% of their needs  MONITOR:   PO intake, Diet advancement, I & O's, Labs, Weight trends, Skin, Supplement acceptance  REASON FOR ASSESSMENT:   Malnutrition Screening Tool   ASSESSMENT:   Pt with PMH of HTN, opoid dependence, and GERD. 1/22 EGD revealed severe esophagitis; pt currently experiencing dysphagia.    Spoke with pt at bedside. Pt is currently on full liquids; he says that earlier today he could not even getting water down. Pt reports that his appetite PTA was good, but once he had an episode of vomiting a few weeks ago he has not been able to swallow properly. Pt reports no chewing problems.  Pt reports that he has 2-3 meals a day, often beginning with lunch. Lunch can be a wide variety of things such as deli sandwiches, fast food, frozen meals, or left overs from dinner. He does mention that they (him, his wife, and step son) do not often eat fruits and vegetables. Dinner is similar to lunch; fast food and frozen meals. He says he prefers meat (chicken, beef, pork) and potatoes. Both him and his wife cook the meals. He likes to have sweet tea, pepsi and sometimes water to drink with his meals. Pt also reports that he snacks on chips and cookies often throughout the day.  Pt says he lost 5# recently d/t dysphagia. Reported UBW 175-180#. Pt able to complete ADL w/o problems. NFPE revealed mild muscle depletion in temple and hand. To prevent muscle loss recommended Ensure d/t full liquid status. Pt was receptive and willing to try; says he would be happy to drink it if he can get it down.  Medications reviewed and include:  suboxone, pantoprazole 40mg  BID Labs reviewed.  NUTRITION - FOCUSED PHYSICAL EXAM:    Most Recent Value  Orbital Region  No depletion  Upper Arm Region  No depletion  Thoracic and Lumbar Region  No depletion  Buccal Region  No depletion  Temple Region  Mild depletion  Clavicle Bone Region  No depletion  Clavicle and Acromion Bone Region  No depletion  Scapular Bone Region  No depletion  Dorsal Hand  Mild depletion  Patellar Region  No depletion  Anterior Thigh Region  No depletion  Posterior Calf Region  No depletion  Edema (RD Assessment)  None  Hair  Reviewed  Eyes  Reviewed  Mouth  Reviewed  Skin  Reviewed  Nails  Reviewed      Diet Order:   Diet Order            Diet full liquid Room service appropriate? Yes; Fluid consistency: Thin  Diet effective now              EDUCATION NEEDS:   Education needs have been addressed  Skin:  Skin Assessment: Reviewed RN Assessment  Last BM:  1/21  Height:   Ht Readings from Last 1 Encounters:  06/01/18 5\' 5"  (1.651 m)   Weight:   Wt Readings from Last 1 Encounters:  06/01/18 73.2 kg    Ideal Body Weight:  61.81 kg  BMI:  Body mass index is 26.85 kg/m.  Estimated Nutritional Needs:  Kcal:  1750-1950 kcal  Protein:  90-100 g  Fluid:  >/=1.75L    Vincent Clarke Dietetic Intern

## 2018-06-05 DIAGNOSIS — F17200 Nicotine dependence, unspecified, uncomplicated: Secondary | ICD-10-CM

## 2018-06-05 DIAGNOSIS — K2210 Ulcer of esophagus without bleeding: Secondary | ICD-10-CM

## 2018-06-05 DIAGNOSIS — I10 Essential (primary) hypertension: Secondary | ICD-10-CM

## 2018-06-05 DIAGNOSIS — R1310 Dysphagia, unspecified: Secondary | ICD-10-CM

## 2018-06-05 LAB — BASIC METABOLIC PANEL
Anion gap: 9 (ref 5–15)
BUN: 6 mg/dL (ref 6–20)
CO2: 26 mmol/L (ref 22–32)
Calcium: 9 mg/dL (ref 8.9–10.3)
Chloride: 103 mmol/L (ref 98–111)
Creatinine, Ser: 0.74 mg/dL (ref 0.61–1.24)
GFR calc Af Amer: >60 mL/min (ref >60)
GFR calc non Af Amer: >60 mL/min (ref >60)
Glucose, Bld: 113 mg/dL — ABNORMAL HIGH (ref 70–99)
Potassium: 3.7 mmol/L (ref 3.5–5.1)
Sodium: 138 mmol/L (ref 135–145)

## 2018-06-05 LAB — CBC
HCT: 36.7 % — ABNORMAL LOW (ref 39.0–52.0)
Hemoglobin: 12.1 g/dL — ABNORMAL LOW (ref 13.0–17.0)
MCH: 28.9 pg (ref 26.0–34.0)
MCHC: 33 g/dL (ref 30.0–36.0)
MCV: 87.6 fL (ref 80.0–100.0)
Platelets: 293 10*3/uL (ref 150–400)
RBC: 4.19 MIL/uL — ABNORMAL LOW (ref 4.22–5.81)
RDW: 12.5 % (ref 11.5–15.5)
WBC: 7.6 10*3/uL (ref 4.0–10.5)
nRBC: 0 % (ref 0.0–0.2)

## 2018-06-05 LAB — VITAMIN B12: Vitamin B-12: 440 pg/mL (ref 180–914)

## 2018-06-05 MED ORDER — PHENOL 1.4 % MT LIQD: 1.0000 | OROMUCOSAL | 0 refills | Status: DC | PRN

## 2018-06-05 MED ORDER — PANTOPRAZOLE SODIUM 40 MG PO PACK: 40.0000 mg | PACK | Freq: Two times a day (BID) | ORAL | 0 refills | Status: DC

## 2018-06-05 MED ORDER — ADULT MULTIVITAMIN W/MINERALS CH: 1.0000 | ORAL_TABLET | Freq: Every day | ORAL | 0 refills | Status: AC

## 2018-06-05 MED ORDER — SUCRALFATE 1 GM/10ML PO SUSP: 1.0000 g | Freq: Three times a day (TID) | ORAL | 0 refills | Status: DC

## 2018-06-05 MED ORDER — ENSURE ENLIVE PO LIQD: 237.0000 mL | Freq: Four times a day (QID) | ORAL | 12 refills | Status: DC

## 2018-06-05 MED ORDER — MENTHOL 3 MG MT LOZG: 1.0000 | LOZENGE | OROMUCOSAL | 12 refills | Status: DC | PRN

## 2018-06-05 NOTE — Care Management (Signed)
Clinic information provided on patient's AVS.  Most scripts are OTC or affordable.
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
## 2018-06-08 ENCOUNTER — Ambulatory Visit (INDEPENDENT_AMBULATORY_CARE_PROVIDER_SITE_OTHER): Payer: Medicaid Other | Admitting: Student in an Organized Health Care Education/Training Program

## 2018-06-08 ENCOUNTER — Encounter: Payer: Self-pay | Admitting: Student in an Organized Health Care Education/Training Program

## 2018-06-08 VITALS — BP 159/95 | HR 98 | Wt 160.4 lb

## 2018-06-08 DIAGNOSIS — Z79899 Other long term (current) drug therapy: Secondary | ICD-10-CM | POA: Diagnosis not present

## 2018-06-08 DIAGNOSIS — F1121 Opioid dependence, in remission: Secondary | ICD-10-CM

## 2018-06-08 DIAGNOSIS — K2210 Ulcer of esophagus without bleeding: Secondary | ICD-10-CM

## 2018-06-08 DIAGNOSIS — F1120 Opioid dependence, uncomplicated: Secondary | ICD-10-CM

## 2018-06-08 MED ORDER — BUPRENORPHINE HCL-NALOXONE HCL 8-2 MG SL SUBL: 1.0000 | SUBLINGUAL_TABLET | Freq: Two times a day (BID) | SUBLINGUAL | 0 refills | Status: DC

## 2018-06-08 MED FILL — BUPRENORPHIN-NALOXON 8-2 MG: 8-2 | 7 days supply | Qty: 14 | Fill #0

## 2018-06-08 NOTE — Assessment & Plan Note (Signed)
Stable after recent hospitalization.  Tolerating liquid diet well.  Appears with well-hydrated.  Plan is to continue twice daily PPI and sucralfate.  Not affecting his Suboxone as that is sublingual.  Hopefully we will see more improvement in his symptoms next week.

## 2018-06-08 NOTE — Patient Instructions (Signed)
Keep up the good work.  We talked about your cravings.  Continue with Suboxone 2 tablets/day.  Continue on your liquid diet with the other medicines for your stomach.  Follow-up with [place] in 1 week.

## 2018-06-08 NOTE — Assessment & Plan Note (Signed)
A little worse this week.  He denies any relapses, but does tell me that he has had worsening cravings.  This seems reasonable given recent stress, hospitalization last week with the esophagitis, big changes to his nutrition and functional status.  Will continue with Suboxone 8 mg twice daily sublingual.  Tolerating this despite his esophageal problems.  I reviewed the database which was appropriate.  Urine substance test obtained today.  Follow-up with [place] in 1 week.

## 2018-06-08 NOTE — Progress Notes (Signed)
   06/08/2018  Vincent Clarke presents for follow up of opioid use disorder I have reviewed the prior induction visit, follow up visits, and telephone encounters relevant to opiate use disorder (OUD) treatment.   Current daily dose: Suboxone 16 mg daily  Date of Induction: 05/14/18  Current follow up interval, in weeks: 1  The patient has been adherent with the buprenorphine for OUD contract.    HPI: 53 year old man here for follow-up of opioid use disorder.  Patient had a difficult week.  We sent him to the hospital last week because of dehydration and inability to swallow due to pain in his esophagus.  Evaluation showed a stricture, endoscopy showed esophagitis that was severe.  Treatment has been medical, twice daily PPI and Carafate along with a liquid diet.  He feels there is been some improvement.  He is tolerating liquid diet well.  Feels his energy level is fine.  Functional status a little low and feels weaker after this hospital stay.  All the stress has increased his cravings for opioids.  Denies any relapses.  And to take extra dose of Suboxone in response to the cravings.  No fevers or chills.  No chest pain today.  Exam:   Vitals:   06/08/18 0954  BP: (!) 159/95  Pulse: 98  SpO2: 100%  Weight: 160 lb 6.4 oz (72.8 kg)    General: Well-appearing, anxious, diaphoretic mildly Heart: Regular rate and rhythm with no murmurs Lungs: Clear throughout with no crackles Extremities: Are warm and well-perfused with no edema ENT: Moist mucous membranes  Assessment/Plan:  See Problem Based Charting in the Encounters Tab     [name], MD  06/08/2018  10:38 AM

## 2018-06-13 LAB — TOXASSURE SELECT,+ANTIDEPR,UR

## 2018-06-15 ENCOUNTER — Ambulatory Visit (INDEPENDENT_AMBULATORY_CARE_PROVIDER_SITE_OTHER): Payer: Medicaid Other | Admitting: Internal Medicine

## 2018-06-15 ENCOUNTER — Other Ambulatory Visit: Payer: Self-pay

## 2018-06-15 VITALS — BP 131/87 | HR 83 | Temp 98.3°F | Ht 65.5 in | Wt 155.1 lb

## 2018-06-15 DIAGNOSIS — F1121 Opioid dependence, in remission: Secondary | ICD-10-CM

## 2018-06-15 DIAGNOSIS — K2210 Ulcer of esophagus without bleeding: Secondary | ICD-10-CM

## 2018-06-15 DIAGNOSIS — F1120 Opioid dependence, uncomplicated: Secondary | ICD-10-CM | POA: Diagnosis not present

## 2018-06-15 MED ORDER — BUPRENORPHINE HCL-NALOXONE HCL 8-2 MG SL SUBL: 1.0000 | SUBLINGUAL_TABLET | Freq: Two times a day (BID) | SUBLINGUAL | 0 refills | Status: DC

## 2018-06-15 MED ORDER — PANTOPRAZOLE SODIUM 40 MG PO TBEC: 40.0000 mg | DELAYED_RELEASE_TABLET | Freq: Two times a day (BID) | ORAL | 0 refills | Status: DC

## 2018-06-15 MED FILL — BUPRENORPHIN-NALOXON 8-2 MG: 8-2 | 14 days supply | Qty: 28 | Fill #0

## 2018-06-15 MED FILL — PANTOPRAZOLE SOD DR 40 MG T: 40 | 30 days supply | Qty: 60 | Fill #0

## 2018-06-15 NOTE — Assessment & Plan Note (Signed)
He reports to me he only has a few pills of Protonix left and he is requesting a refill.  He was unable to afford the Protonix powder but has been doing okay with tablets.  I will continue him on Protonix 40 mg twice daily.  Unfortunately he cannot afford Carafate.  Recommended to continue liquids and stay hydrated while we allow time for his esophagitis to heal.

## 2018-06-15 NOTE — Progress Notes (Signed)
   06/15/2018  Vincent Clarke presents for follow up of opioid use disorder I have reviewed the prior induction visit, follow up visits, and telephone encounters relevant to opiate use disorder (OUD) treatment.   Current daily dose: Suboxone 16 mg daily  Date of Induction: 05/14/18  Current follow up interval, in weeks: 1  The patient has been adherent with the buprenorphine for OUD contract.    HPI: 53 year old man here for follow-up of opioid use disorder.  He reports he is doing a little better this week he still struggling with erosive esophagitis.  He reports he occasionally is having to move around to aid in digestion.  He is mostly taking liquids he is realized that non-spicy soup broth and milk is better and he is trying to stay well-hydrated.  He is taking pantoprazole but has not been able to afford Carafate.  For his IUD he reports that he is back to his usual dosing of Suboxone.  He denies any relapses with cocaine or heroin.  He feels that the Suboxone helps with his esophageal problems.  Exam:   Vitals:   06/15/18 0954  BP: 131/87  Pulse: 83  Temp: 98.3 F (36.8 C)  TempSrc: Oral  SpO2: 99%  Weight: 155 lb 1.6 oz (70.4 kg)  Height: 5' 5.5" (1.664 m)    General: Well-appearing, anxious, diaphoretic mildly Extremities: Are warm and well-perfused with no edema ENT: Moist mucous membranes  Assessment/Plan:  See Problem Based Charting in the Encounters Tab     Gust Rung, DO  06/15/2018  10:29 AM

## 2018-06-15 NOTE — Assessment & Plan Note (Signed)
Appears to be doing better this week I will continue him on Suboxone 16-4 mg total daily dose.  I will extend the interval out to 2 weeks for follow-up.

## 2018-06-16 ENCOUNTER — Ambulatory Visit: Payer: Self-pay

## 2018-06-21 ENCOUNTER — Inpatient Hospital Stay (HOSPITAL_COMMUNITY)
Admission: EM | Admit: 2018-06-21 | Discharge: 2018-06-29 | DRG: 391 | Disposition: A | Discharge status: Home Health | Payer: Medicaid Other | Attending: Internal Medicine | Admitting: Internal Medicine

## 2018-06-21 ENCOUNTER — Other Ambulatory Visit: Payer: Self-pay

## 2018-06-21 ENCOUNTER — Encounter (HOSPITAL_COMMUNITY): Payer: Self-pay

## 2018-06-21 ENCOUNTER — Other Ambulatory Visit (HOSPITAL_COMMUNITY): Payer: Self-pay

## 2018-06-21 DIAGNOSIS — Z6825 Body mass index (BMI) 25.0-25.9, adult: Secondary | ICD-10-CM

## 2018-06-21 DIAGNOSIS — K219 Gastro-esophageal reflux disease without esophagitis: Secondary | ICD-10-CM | POA: Diagnosis present

## 2018-06-21 DIAGNOSIS — F17210 Nicotine dependence, cigarettes, uncomplicated: Secondary | ICD-10-CM | POA: Diagnosis present

## 2018-06-21 DIAGNOSIS — E43 Unspecified severe protein-calorie malnutrition: Secondary | ICD-10-CM | POA: Diagnosis present

## 2018-06-21 DIAGNOSIS — J45909 Unspecified asthma, uncomplicated: Secondary | ICD-10-CM | POA: Diagnosis present

## 2018-06-21 DIAGNOSIS — F17200 Nicotine dependence, unspecified, uncomplicated: Secondary | ICD-10-CM | POA: Diagnosis present

## 2018-06-21 DIAGNOSIS — I10 Essential (primary) hypertension: Secondary | ICD-10-CM | POA: Diagnosis present

## 2018-06-21 DIAGNOSIS — R1310 Dysphagia, unspecified: Secondary | ICD-10-CM

## 2018-06-21 DIAGNOSIS — K2210 Ulcer of esophagus without bleeding: Secondary | ICD-10-CM | POA: Diagnosis present

## 2018-06-21 DIAGNOSIS — I739 Peripheral vascular disease, unspecified: Secondary | ICD-10-CM | POA: Diagnosis present

## 2018-06-21 DIAGNOSIS — Z9119 Patient's noncompliance with other medical treatment and regimen: Secondary | ICD-10-CM

## 2018-06-21 DIAGNOSIS — F1121 Opioid dependence, in remission: Secondary | ICD-10-CM | POA: Diagnosis present

## 2018-06-21 DIAGNOSIS — Z79899 Other long term (current) drug therapy: Secondary | ICD-10-CM

## 2018-06-21 DIAGNOSIS — F1120 Opioid dependence, uncomplicated: Secondary | ICD-10-CM | POA: Diagnosis present

## 2018-06-21 DIAGNOSIS — Z539 Procedure and treatment not carried out, unspecified reason: Secondary | ICD-10-CM | POA: Diagnosis present

## 2018-06-21 DIAGNOSIS — K222 Esophageal obstruction: Principal | ICD-10-CM | POA: Diagnosis present

## 2018-06-21 NOTE — ED Triage Notes (Signed)
Pt arrives POV for eval of esophagitis. Pt was recently admitted here for same, dc'd home on liquid diet. Pt reports in last week he is unable to eat, "nothing will pass" through his esophagus, denies vomiting. States he is unable to tolerate liquid diet or ensure. Reports he is hungry but unable to eat. Wife reports worsening weakness and general malaise at home, pt reports he believes he has lost 20lbs in last week. NARD, NAD in triage.

## 2018-06-22 ENCOUNTER — Encounter (HOSPITAL_COMMUNITY): Payer: Self-pay | Admitting: Internal Medicine

## 2018-06-22 ENCOUNTER — Inpatient Hospital Stay (HOSPITAL_COMMUNITY): Payer: Medicaid Other | Admitting: Anesthesiology

## 2018-06-22 ENCOUNTER — Inpatient Hospital Stay (HOSPITAL_COMMUNITY): Payer: Medicaid Other

## 2018-06-22 ENCOUNTER — Encounter (HOSPITAL_COMMUNITY)
Admission: EM | Disposition: A | Discharge status: Home Health | Payer: Self-pay | Source: Home / Self Care | Attending: Internal Medicine

## 2018-06-22 DIAGNOSIS — F17210 Nicotine dependence, cigarettes, uncomplicated: Secondary | ICD-10-CM | POA: Diagnosis present

## 2018-06-22 DIAGNOSIS — F1121 Opioid dependence, in remission: Secondary | ICD-10-CM | POA: Diagnosis present

## 2018-06-22 DIAGNOSIS — Z9119 Patient's noncompliance with other medical treatment and regimen: Secondary | ICD-10-CM | POA: Diagnosis not present

## 2018-06-22 DIAGNOSIS — K2210 Ulcer of esophagus without bleeding: Secondary | ICD-10-CM | POA: Diagnosis present

## 2018-06-22 DIAGNOSIS — I10 Essential (primary) hypertension: Secondary | ICD-10-CM | POA: Diagnosis present

## 2018-06-22 DIAGNOSIS — K222 Esophageal obstruction: Secondary | ICD-10-CM | POA: Diagnosis not present

## 2018-06-22 DIAGNOSIS — R1310 Dysphagia, unspecified: Secondary | ICD-10-CM | POA: Diagnosis not present

## 2018-06-22 DIAGNOSIS — I739 Peripheral vascular disease, unspecified: Secondary | ICD-10-CM | POA: Diagnosis present

## 2018-06-22 DIAGNOSIS — Z539 Procedure and treatment not carried out, unspecified reason: Secondary | ICD-10-CM | POA: Diagnosis present

## 2018-06-22 DIAGNOSIS — E43 Unspecified severe protein-calorie malnutrition: Secondary | ICD-10-CM | POA: Diagnosis present

## 2018-06-22 DIAGNOSIS — Z79899 Other long term (current) drug therapy: Secondary | ICD-10-CM | POA: Diagnosis not present

## 2018-06-22 DIAGNOSIS — Z6825 Body mass index (BMI) 25.0-25.9, adult: Secondary | ICD-10-CM | POA: Diagnosis not present

## 2018-06-22 DIAGNOSIS — J45909 Unspecified asthma, uncomplicated: Secondary | ICD-10-CM | POA: Diagnosis present

## 2018-06-22 DIAGNOSIS — K219 Gastro-esophageal reflux disease without esophagitis: Secondary | ICD-10-CM | POA: Diagnosis present

## 2018-06-22 DIAGNOSIS — F17200 Nicotine dependence, unspecified, uncomplicated: Secondary | ICD-10-CM

## 2018-06-22 HISTORY — PX: ESOPHAGOGASTRODUODENOSCOPY (EGD) WITH PROPOFOL: SHX5813

## 2018-06-22 HISTORY — PX: BIOPSY: SHX5522

## 2018-06-22 LAB — CBC WITH DIFFERENTIAL/PLATELET
Abs Immature Granulocytes: 0.02 10*3/uL (ref 0.00–0.07)
Basophils Absolute: 0 10*3/uL (ref 0.0–0.1)
Basophils Relative: 1 %
Eosinophils Absolute: 0.5 10*3/uL (ref 0.0–0.5)
Eosinophils Relative: 6 %
HCT: 41.8 % (ref 39.0–52.0)
Hemoglobin: 13.5 g/dL (ref 13.0–17.0)
Immature Granulocytes: 0 %
Lymphocytes Relative: 33 %
Lymphs Abs: 2.9 10*3/uL (ref 0.7–4.0)
MCH: 29.1 pg (ref 26.0–34.0)
MCHC: 32.3 g/dL (ref 30.0–36.0)
MCV: 90.1 fL (ref 80.0–100.0)
Monocytes Absolute: 0.8 10*3/uL (ref 0.1–1.0)
Monocytes Relative: 9 %
Neutro Abs: 4.5 10*3/uL (ref 1.7–7.7)
Neutrophils Relative %: 51 %
Platelets: 317 10*3/uL (ref 150–400)
RBC: 4.64 MIL/uL (ref 4.22–5.81)
RDW: 13.2 % (ref 11.5–15.5)
WBC: 8.8 10*3/uL (ref 4.0–10.5)
nRBC: 0 % (ref 0.0–0.2)

## 2018-06-22 LAB — COMPREHENSIVE METABOLIC PANEL
ALT: 13 U/L (ref 0–44)
AST: 15 U/L (ref 15–41)
Albumin: 3.9 g/dL (ref 3.5–5.0)
Alkaline Phosphatase: 58 U/L (ref 38–126)
Anion gap: 14 (ref 5–15)
BUN: 6 mg/dL (ref 6–20)
CO2: 21 mmol/L — ABNORMAL LOW (ref 22–32)
Calcium: 9.5 mg/dL (ref 8.9–10.3)
Chloride: 106 mmol/L (ref 98–111)
Creatinine, Ser: 0.72 mg/dL (ref 0.61–1.24)
GFR calc Af Amer: >60 mL/min (ref >60)
GFR calc non Af Amer: >60 mL/min (ref >60)
Glucose, Bld: 84 mg/dL (ref 70–99)
Potassium: 3.6 mmol/L (ref 3.5–5.1)
Sodium: 141 mmol/L (ref 135–145)
Total Bilirubin: 0.7 mg/dL (ref 0.3–1.2)
Total Protein: 6.8 g/dL (ref 6.5–8.1)

## 2018-06-22 SURGERY — ESOPHAGOGASTRODUODENOSCOPY (EGD) WITH PROPOFOL
Anesthesia: Monitor Anesthesia Care

## 2018-06-22 MED ORDER — BUPRENORPHINE HCL-NALOXONE HCL 8-2 MG SL SUBL
1.0000 | SUBLINGUAL_TABLET | Freq: Two times a day (BID) | SUBLINGUAL | Status: DC
  Administered 2018-06-22 – 2018-06-29 (×15): 1 via SUBLINGUAL
  Filled 2018-06-22 (×15): qty 1

## 2018-06-22 MED ORDER — ACETAMINOPHEN 325 MG PO TABS: 650.0000 mg | ORAL_TABLET | Freq: Four times a day (QID) | ORAL | Status: DC | PRN

## 2018-06-22 MED ORDER — SODIUM CHLORIDE 0.9 % IV SOLN
INTRAVENOUS | Status: DC
  Administered 2018-06-22: 1000 mL via INTRAVENOUS
  Administered 2018-06-22 – 2018-06-23 (×3): via INTRAVENOUS
  Administered 2018-06-23: 1000 mL via INTRAVENOUS
  Administered 2018-06-24 – 2018-06-27 (×7): via INTRAVENOUS

## 2018-06-22 MED ORDER — PROPOFOL 10 MG/ML IV BOLUS
INTRAVENOUS | Status: DC | PRN
  Administered 2018-06-22 (×2): 40 mg via INTRAVENOUS

## 2018-06-22 MED ORDER — PANTOPRAZOLE SODIUM 40 MG IV SOLR
40.0000 mg | Freq: Two times a day (BID) | INTRAVENOUS | Status: DC
  Administered 2018-06-22 – 2018-06-28 (×14): 40 mg via INTRAVENOUS
  Filled 2018-06-22 (×14): qty 40

## 2018-06-22 MED ORDER — LIDOCAINE HCL (CARDIAC) PF 100 MG/5ML IV SOSY
PREFILLED_SYRINGE | INTRAVENOUS | Status: DC | PRN
  Administered 2018-06-22: 100 mg via INTRAVENOUS

## 2018-06-22 MED ORDER — ACETAMINOPHEN 650 MG RE SUPP: 650.0000 mg | Freq: Four times a day (QID) | RECTAL | Status: DC | PRN

## 2018-06-22 MED ORDER — SODIUM CHLORIDE 0.9 % IV BOLUS
1000.0000 mL | Freq: Once | INTRAVENOUS | Status: AC
  Administered 2018-06-22: 1000 mL via INTRAVENOUS

## 2018-06-22 MED ORDER — SUCRALFATE 1 GM/10ML PO SUSP
1.0000 g | Freq: Three times a day (TID) | ORAL | Status: DC
  Administered 2018-06-22: 1 g via ORAL
  Filled 2018-06-22 (×2): qty 10

## 2018-06-22 MED ORDER — BUTAMBEN-TETRACAINE-BENZOCAINE 2-2-14 % EX AERO
INHALATION_SPRAY | CUTANEOUS | Status: DC | PRN
  Administered 2018-06-22: 2 via TOPICAL

## 2018-06-22 MED ORDER — ONDANSETRON HCL 4 MG/2ML IJ SOLN: 4.0000 mg | Freq: Four times a day (QID) | INTRAMUSCULAR | Status: DC | PRN

## 2018-06-22 MED ORDER — ADULT MULTIVITAMIN W/MINERALS CH
1.0000 | ORAL_TABLET | Freq: Every day | ORAL | Status: DC
  Administered 2018-06-22: 1 via ORAL
  Filled 2018-06-22: qty 1

## 2018-06-22 MED ORDER — PROPOFOL 500 MG/50ML IV EMUL
INTRAVENOUS | Status: DC | PRN
  Administered 2018-06-22: 150 ug/kg/min via INTRAVENOUS

## 2018-06-22 MED ORDER — EPHEDRINE SULFATE-NACL 50-0.9 MG/10ML-% IV SOSY
PREFILLED_SYRINGE | INTRAVENOUS | Status: DC | PRN
  Administered 2018-06-22: 10 mg via INTRAVENOUS

## 2018-06-22 MED ORDER — ONDANSETRON HCL 4 MG PO TABS: 4.0000 mg | ORAL_TABLET | Freq: Four times a day (QID) | ORAL | Status: DC | PRN

## 2018-06-22 SURGICAL SUPPLY — 15 items

## 2018-06-22 NOTE — Anesthesia Preprocedure Evaluation (Addendum)
Anesthesia Evaluation  Patient identified by MRN, date of birth, ID band Patient awake    Reviewed: Allergy & Precautions, NPO status , Patient's Chart, lab work & pertinent test results  History of Anesthesia Complications Negative for: history of anesthetic complications  Airway Mallampati: II  TM Distance: >3 FB Neck ROM: Full    Dental  (+) Edentulous Upper, Edentulous Lower   Pulmonary asthma , Current Smoker,    breath sounds clear to auscultation       Cardiovascular hypertension, + Peripheral Vascular Disease   Rhythm:Regular Rate:Normal     Neuro/Psych PSYCHIATRIC DISORDERS negative neurological ROS     GI/Hepatic Neg liver ROS, PUD, GERD  Medicated and Controlled,  Endo/Other  negative endocrine ROS  Renal/GU negative Renal ROS     Musculoskeletal  (+) Arthritis ,   Abdominal   Peds  Hematology negative hematology ROS (+)   Anesthesia Other Findings On suboxone   Reproductive/Obstetrics                            Anesthesia Physical Anesthesia Plan  ASA: II  Anesthesia Plan: MAC   Post-op Pain Management:    Induction: Intravenous  PONV Risk Score and Plan: 1 and Propofol infusion and Treatment may vary due to age or medical condition  Airway Management Planned: Nasal Cannula and Natural Airway  Additional Equipment: None  Intra-op Plan:   Post-operative Plan:   Informed Consent: I have reviewed the patients History and Physical, chart, labs and discussed the procedure including the risks, benefits and alternatives for the proposed anesthesia with the patient or authorized representative who has indicated his/her understanding and acceptance.       Plan Discussed with: CRNA and Anesthesiologist  Anesthesia Plan Comments:        Anesthesia Quick Evaluation
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
## 2018-06-22 NOTE — Transfer of Care (Signed)
Immediate Anesthesia Transfer of Care Note  Patient: Vincent Clarke  Procedure(s) Performed: ESOPHAGOGASTRODUODENOSCOPY (EGD) WITH PROPOFOL (N/A ) BIOPSY  Patient Location: Endoscopy Unit  Anesthesia Type:MAC  Level of Consciousness: sedated  Airway & Oxygen Therapy: Patient Spontanous Breathing and Patient connected to nasal cannula oxygen  Post-op Assessment: Report given to RN and Post -op Vital signs reviewed and stable  Post vital signs: Reviewed and stable  Last Vitals:  Vitals Value Taken Time  BP 105/82 06/22/2018 11:13 AM  Temp    Pulse 81 06/22/2018 11:13 AM  Resp 25 06/22/2018 11:14 AM  SpO2 99 % 06/22/2018 11:13 AM  Vitals shown include unvalidated device data.  Last Pain:  Vitals:   06/22/18 1113  TempSrc:   PainSc: 0-No pain         Complications: No apparent anesthesia complications

## 2018-06-22 NOTE — Progress Notes (Signed)
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 I could not dilate the stricture based on the findings today. I have discussed his case with Dr. Corky Downs of radiology. The patient has not had dedicated CT imaging of the chest recently, will do that to ensure nothing else is going on. Based on that finding, can consider a water soluble esophogram to see if anything is getting through and if so, length of stricture. Given his progressive weight loss and endoscopic findings, with severe stricture despite high dose PPI, he may warrant a PEG. Will await imaging first.   Vincent Patrick, MD [facility]

## 2018-06-22 NOTE — Anesthesia Postprocedure Evaluation (Signed)
Anesthesia Post Note  Patient: Vincent Clarke  Procedure(s) Performed: ESOPHAGOGASTRODUODENOSCOPY (EGD) WITH PROPOFOL (N/A ) BIOPSY     Patient location during evaluation: PACU Anesthesia Type: MAC Level of consciousness: awake and alert Pain management: pain level controlled Vital Signs Assessment: post-procedure vital signs reviewed and stable Respiratory status: spontaneous breathing, nonlabored ventilation and respiratory function stable Cardiovascular status: stable and blood pressure returned to baseline Anesthetic complications: no    Last Vitals:  Vitals:   06/22/18 1125 06/22/18 1151  BP: 111/62 (!) 148/75  Pulse:  61  Resp:  16  Temp:  36.6 C  SpO2: 100% 100%    Last Pain:  Vitals:   06/22/18 1151  TempSrc: Oral  PainSc:                  Audry Pili

## 2018-06-22 NOTE — ED Notes (Addendum)
Pt transferred to ENDO. Report given to ENDO RN. Pt to be transferred to inpatient floor after procedure.

## 2018-06-22 NOTE — Op Note (Addendum)
[facility] Patient Name: Vincent Clarke Procedure Date : 06/22/2018 MRN: [id number] Attending MD: Willaim Rayas. Adela Lank , MD Date of Birth: 07-01-1965 CSN: [id number] Age: 53 Admit Type: Inpatient Procedure:                Upper GI endoscopy Indications:              Dysphagia, Odynophagia - history of severe                            esophagitis with high grade stricture on EGD a few                            weeks ago, now intolerant to much of any liquids /                            pills, progressive worsening Providers:                Willaim Rayas. Adela Lank, MD, April Holding, RN, Harrington Challenger, Technician Referring MD:              Medicines:                Monitored Anesthesia Care Complications:            No immediate complications. Estimated blood loss:                            Minimal. Estimated Blood Loss:     Estimated blood loss was minimal. Procedure:                Pre-Anesthesia Assessment:                           - Prior to the procedure, a History and Physical                            was performed, and patient medications and                            allergies were reviewed. The patient's tolerance of                            previous anesthesia was also reviewed. The risks                            and benefits of the procedure and the sedation                            options and risks were discussed with the patient.                            All questions were answered, and informed consent  was obtained. Prior Anticoagulants: The patient has                            taken no previous anticoagulant or antiplatelet                            agents. ASA Grade Assessment: II - A patient with                            mild systemic disease. After reviewing the risks                            and benefits, the patient was deemed in                            satisfactory condition to  undergo the procedure.                           After obtaining informed consent, the endoscope was                            passed under direct vision. Throughout the                            procedure, the patient's blood pressure, pulse, and                            oxygen saturations were monitored continuously. The                            [id number]-[id number] ([id number] Olympus gastroscope was                            introduced through the mouth, with the intention of                            advancing to the duodenum. The scope was advanced                            to the middle third of the esophagus before the                            procedure was aborted. Medications were given. The                            upper GI endoscopy was accomplished without                            difficulty. The patient tolerated the procedure                            well. Scope In: Scope Out: Findings:      One benign-appearing, intrinsic severe stenosis was found 28 cm from the  incisors. There was ulceration and inflammation at the site. The lumen       appeared almost completely obliterated, with a very slight 31mm or so       orifice. I could not tell if inflammatory tissue or residual food was at       the center of the stenosis initially, it was removed with forceps and       appeared to be inflamed tissue and think less likely this is a true food       impaction (the patient has not eaten anything solid since his last EGD,       only liquids). The suspected lumen was probed with the forceps but there       was resistance and further attempts was aborted. The tissue that was       biopsied was sent for pathology.      The exam of the esophagus was otherwise normal. 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                            Biopsied.                            Will discuss further evaluation with radiology                            regarding esophogram or other imaging to determine                            length of stenosis (he has not had a dedicated CT                            chest recently). Prior endoscopy did not show any                            malignant changes. This patient otherwise may                            warrant a surgically placed PEG given findings and                            his ongoing weight loss, will await imaging first. Recommendation:           - Return patient to hospital ward for ongoing care.                           - NPO.                           - Continue IV protonix                           - Further recommendations pending imaging, we will  follow Procedure Code(s):        --- Professional ---                           [phone], 52, Esophagogastroduodenoscopy, flexible,                            transoral; with biopsy, single or multiple Diagnosis Code(s):        --- Professional ---                           K22.2, Esophageal obstruction                           R13.10, Dysphagia, unspecified CPT copyright 2018 [employer]. All rights reserved. The codes documented in this report are preliminary and upon coder review may  be revised to meet current compliance requirements. Viviann Spare P. Michial Disney, MD 06/22/2018 11:15:28 AM This report has been signed electronically. Number of Addenda: 0
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
## 2018-06-22 NOTE — Interval H&P Note (Signed)
History and Physical Interval Note:  06/22/2018 10:49 AM  Vincent Clarke  has presented today for surgery, with the diagnosis of Dysphagia/Odynophagia  The various methods of treatment have been discussed with the patient and family. After consideration of risks, benefits and other options for treatment, the patient has consented to  Procedure(s): ESOPHAGOGASTRODUODENOSCOPY (EGD) WITH PROPOFOL (N/A) as a surgical intervention .  The patient's history has been reviewed, patient examined, no change in status, stable for surgery.  I have reviewed the patient's chart and labs.  Questions were answered to the patient's satisfaction.     Viviann Spare P Laurin Paulo
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
## 2018-06-23 ENCOUNTER — Inpatient Hospital Stay (HOSPITAL_COMMUNITY): Payer: Medicaid Other | Admitting: Certified Registered"

## 2018-06-23 ENCOUNTER — Encounter (HOSPITAL_COMMUNITY)
Admission: EM | Disposition: A | Discharge status: Home Health | Payer: Self-pay | Source: Home / Self Care | Attending: Internal Medicine

## 2018-06-23 ENCOUNTER — Inpatient Hospital Stay (HOSPITAL_COMMUNITY): Payer: Medicaid Other

## 2018-06-23 ENCOUNTER — Encounter (HOSPITAL_COMMUNITY): Payer: Self-pay | Admitting: Gastroenterology

## 2018-06-23 DIAGNOSIS — K222 Esophageal obstruction: Secondary | ICD-10-CM

## 2018-06-23 DIAGNOSIS — I10 Essential (primary) hypertension: Secondary | ICD-10-CM

## 2018-06-23 HISTORY — PX: ESOPHAGOGASTRODUODENOSCOPY (EGD) WITH PROPOFOL: SHX5813

## 2018-06-23 HISTORY — PX: SAVORY DILATION: SHX5439

## 2018-06-23 SURGERY — ESOPHAGOGASTRODUODENOSCOPY (EGD) WITH PROPOFOL
Anesthesia: General

## 2018-06-23 MED ORDER — GLYCOPYRROLATE PF 0.2 MG/ML IJ SOSY
PREFILLED_SYRINGE | INTRAMUSCULAR | Status: DC | PRN
  Administered 2018-06-23: .2 mg via INTRAVENOUS

## 2018-06-23 MED ORDER — LACTATED RINGERS IV SOLN
INTRAVENOUS | Status: DC
  Administered 2018-06-23: 11:00:00 via INTRAVENOUS

## 2018-06-23 MED ORDER — FENTANYL CITRATE (PF) 250 MCG/5ML IJ SOLN
INTRAMUSCULAR | Status: DC | PRN
  Administered 2018-06-23: 50 ug via INTRAVENOUS

## 2018-06-23 MED ORDER — DEXAMETHASONE SODIUM PHOSPHATE 10 MG/ML IJ SOLN
INTRAMUSCULAR | Status: DC | PRN
  Administered 2018-06-23: 10 mg via INTRAVENOUS

## 2018-06-23 MED ORDER — LIDOCAINE 2% (20 MG/ML) 5 ML SYRINGE
INTRAMUSCULAR | Status: DC | PRN
  Administered 2018-06-23: 100 mg via INTRAVENOUS

## 2018-06-23 MED ORDER — PHENYLEPHRINE 40 MCG/ML (10ML) SYRINGE FOR IV PUSH (FOR BLOOD PRESSURE SUPPORT)
PREFILLED_SYRINGE | INTRAVENOUS | Status: DC | PRN
  Administered 2018-06-23 (×2): 80 ug via INTRAVENOUS

## 2018-06-23 MED ORDER — EPHEDRINE SULFATE-NACL 50-0.9 MG/10ML-% IV SOSY
PREFILLED_SYRINGE | INTRAVENOUS | Status: DC | PRN
  Administered 2018-06-23 (×3): 10 mg via INTRAVENOUS

## 2018-06-23 MED ORDER — ONDANSETRON HCL 4 MG/2ML IJ SOLN
INTRAMUSCULAR | Status: DC | PRN
  Administered 2018-06-23: 4 mg via INTRAVENOUS

## 2018-06-23 MED ORDER — FENTANYL CITRATE (PF) 100 MCG/2ML IJ SOLN
INTRAMUSCULAR | Status: AC
  Filled 2018-06-23: qty 2

## 2018-06-23 MED ORDER — PROPOFOL 10 MG/ML IV BOLUS
INTRAVENOUS | Status: DC | PRN
  Administered 2018-06-23: 60 mg via INTRAVENOUS
  Administered 2018-06-23: 140 mg via INTRAVENOUS

## 2018-06-23 MED ORDER — NICOTINE 14 MG/24HR TD PT24
14.0000 mg | MEDICATED_PATCH | Freq: Every day | TRANSDERMAL | Status: DC
  Administered 2018-06-23 – 2018-06-29 (×7): 14 mg via TRANSDERMAL
  Filled 2018-06-23 (×7): qty 1

## 2018-06-23 MED ORDER — LACTATED RINGERS IV SOLN
INTRAVENOUS | Status: DC | PRN
  Administered 2018-06-23 (×2): via INTRAVENOUS

## 2018-06-23 MED ORDER — SUCCINYLCHOLINE CHLORIDE 200 MG/10ML IV SOSY
PREFILLED_SYRINGE | INTRAVENOUS | Status: DC | PRN
  Administered 2018-06-23: 120 mg via INTRAVENOUS

## 2018-06-23 SURGICAL SUPPLY — 14 items

## 2018-06-23 NOTE — H&P (View-Only) (Signed)
Progress Note   Subjective  Patient feeling worse today. Can't tolerate his secretions this AM. No shortness of breath. He has been having chest discomfort and odynophagia for weeks, which is stable. CT scan done last night, barium study this AM as below.     Objective   Vital signs in last 24 hours: Temp:  [97.8 F (36.6 C)-98.6 F (37 C)] 98.5 F (36.9 C) (02/12 0737) Pulse Rate:  [46-92] 56 (02/12 0737) Resp:  [16-46] 18 (02/12 0737) BP: (105-148)/(62-82) 120/67 (02/12 0737) SpO2:  [98 %-100 %] 99 % (02/12 0737) Weight:  [70 kg] 70 kg (02/11 1005)   General:    white male in NAD Heart:  Regular rate and rhythm; no murmurs Lungs: Respirations even and unlabored, lungs CTA bilaterally Abdomen:  Soft, nontender and nondistended. Normal bowel sounds. Extremities:  Without edema. Neurologic:  Alert and oriented,  grossly normal neurologically. Psych:  Cooperative. Normal mood and affect.  Intake/Output from previous day: 02/11 0701 - 02/12 0700 In: 300 [I.V.:300] Out: -  Intake/Output this shift: No intake/output data recorded.  Lab Results: Recent Labs    06/22/18 0420  WBC 8.8  HGB 13.5  HCT 41.8  PLT 317   BMET Recent Labs    06/22/18 0420  NA 141  K 3.6  CL 106  CO2 21*  GLUCOSE 84  BUN 6  CREATININE 0.72  CALCIUM 9.5   LFT Recent Labs    06/22/18 0420  PROT 6.8  ALBUMIN 3.9  AST 15  ALT 13  ALKPHOS 58  BILITOT 0.7   PT/INR No results for input(s): LABPROT, INR in the last 72 hours.  Studies/Results: Ct Chest Wo Contrast  Result Date: 06/22/2018 CLINICAL DATA:  High-grade esophageal stricture. Evaluate for extrinsic process. EXAM: CT CHEST WITHOUT CONTRAST TECHNIQUE: Multidetector CT imaging of the chest was performed following the standard protocol without IV contrast. COMPARISON:  06/01/2018 esophagram no prior CT. Chest radiograph 05/10/2018 reviewed. FINDINGS: Cardiovascular: Aortic atherosclerosis. Tortuous thoracic aorta. Normal  heart size, without pericardial effusion. Proximal LAD and probable left main coronary artery calcification. Mediastinum/Nodes: No supraclavicular adenopathy. No mediastinal adenopathy. Hilar regions poorly evaluated without intravenous contrast. The proximal esophagus is moderately dilated and fluid-filled. This continues to the level of the mid esophagus, where an abrupt transition corresponds to the esophagram abnormality. Equivocal soft tissue fullness in this area, at least partially felt to be due to underdistention. No dominant mass extrinsic cause identified. Lungs/Pleura: No pleural fluid. Mild centrilobular and paraseptal emphysema. Relatively diffuse micronodularity is most conspicuous in the right upper lobe on image 74/4 and most likely related to respiratory bronchiolitis. No consolidation or dominant pulmonary mass. Upper Abdomen: Mild hepatic steatosis with more focal steatosis adjacent the falciform. Normal imaged portions of the spleen, stomach, pancreas, kidneys. Mild bilateral adrenal thickening may represent hyperplasia. Cholecystectomy. Musculoskeletal: Mild thoracic spondylosis. IMPRESSION: 1. At least partial esophageal obstruction at the level of the mid esophagus, as on esophagram. Equivocal concurrent soft tissue fullness in this area. Although no extrinsic cause is seen, endoscopy is again recommended to exclude neoplasia. 2. No thoracic adenopathy. 3. Aortic atherosclerosis (ICD10-I70.0) and emphysema (ICD10-J43.9). Mild relatively diffuse micronodularity is most likely related to respiratory bronchiolitis. Mild infection or aspiration could have a similar appearance (especially in the right upper lobe). 4. Age advanced coronary artery atherosclerosis. Recommend assessment of coronary risk factors and consideration of medical therapy. Electronically Signed   By: Jeronimo GreavesKyle  Talbot M.D.   On: 06/22/2018 16:36  Dg Esophagus W Single Cm (sol Or Thin Ba)  Result Date: 06/23/2018 CLINICAL  DATA:  Dysphagia. EXAM: ESOPHOGRAM/BARIUM SWALLOW TECHNIQUE: Single contrast examination was performed using  thin barium. FLUOROSCOPY TIME:  Fluoroscopy Time:  54 seconds COMPARISON:  CT scan of the chest dated 06/22/2018 and esophagram dated 06/01/2018 FINDINGS: The patient ingested a single swallow of thin barium. There is complete obstruction of the esophagus just below the level of the carina. The patient remained in the upright position for 2 minutes and no barium passed through the obstruction. IMPRESSION: Interval complete obstruction of the esophagus just below the level of the carina at the level of the stricture noted on the prior exam. Electronically Signed   By: Francene Boyers M.D.   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 EGD yesterday showed severe stricture without any obvious lumen that could be seen, probed with forceps and unable to visualize the lumen. CT scan done post procedure shows some thickening and the obstruction, no obvious malignancy. Barium swallow this AM showed complete obstruction. The patient is miserable, now not tolerating his secretions.  This stricture appears to be benign based on prior biopsies of it a few weeks ago, has intervally worsened and unclear if he has some residual food or pill causing impaction but he is adamant he has been on liquid diet only since his last procedure.   I discussed the situation with the patient and his wife, and discussed this case with my colleagues. I offered him a repeat EGD today with anesthesia, with elective intubation to protect the airway, with another attempt at visualizing the lumen and dilating the stricture / relieving the obstruction if possible. He understands this  is higher than average risk procedure for perforation / bleeding, post procedure pain, etc, and wants to proceed. If this is not successful may warrant transfer to a tertiary care facility. EGD to be done ASAP as soon as anesthesia available.  Ileene Patrick, MD [facility]

## 2018-06-23 NOTE — Op Note (Addendum)
[facility] Patient Name: Vincent Clarke Procedure Date : 06/23/2018 MRN: [id number] Attending MD: Carlota Raspberry. Averi Cacioppo , MD Date of Birth: 01/22/1966 CSN: [id number] Age: 53 Admit Type: Inpatient Procedure:                Upper GI endoscopy Indications:              Dysphagia, complete esophageal obstruction on                            barium study, high grade stricture without obvious                            food impaction on prior EGD - intolerant of                            secretions, here for another attempt at relief of                            obstruction / dilation Providers:                Carlota Raspberry. Havery [name], MD, Burtis Junes, RN, Tinnie Gens, Technician Referring MD:              Medicines:                Monitored Anesthesia Care Complications:            No immediate complications. Estimated blood loss:                            Minimal. Estimated Blood Loss:     Estimated blood loss was minimal. Procedure:                Pre-Anesthesia Assessment:                           - Prior to the procedure, a History and Physical                            was performed, and patient medications and                            allergies were reviewed. The patient's tolerance of                            previous anesthesia was also reviewed. The risks                            and benefits of the procedure and the sedation                            options and risks were discussed with the patient.  All questions were answered, and informed consent                            was obtained. Prior Anticoagulants: The patient has                            taken no previous anticoagulant or antiplatelet                            agents. ASA Grade Assessment: III - A patient with                            severe systemic disease. After reviewing the risks                            and benefits, the patient  was deemed in                            satisfactory condition to undergo the procedure.                           After obtaining informed consent, the endoscope was                            passed under direct vision. Throughout the                            procedure, the patient's blood pressure, pulse, and                            oxygen saturations were monitored continuously. The                            GIF-H190 ([id number]) Olympus gastroscope was                            introduced through the mouth, with the intention of                            advancing to the duodenum. The scope was advanced                            to the middle third of the esophagus before the                            procedure was aborted. Medications were given. The                            upper GI endoscopy was technically difficult and                            complex. The patient tolerated the procedure well. Scope In: Scope Out: Findings:      One benign-appearing, intrinsic severe stenosis  was found 28 cm from the       incisors. Residual barium was initially noted which was lavaged. There       was what I suspect is severe inflammatory tissue at the orifice which       obliterates the lumen. This did not seem to be residual food, I suspect       inflammatory / exudative changes, it was not able to be removed with       forceps despite multiple attempts. A guidewire was placed under       fluoroscopic guidance which was able to pass without resistance down the       esophagus and into the stomach based on flouroscopic views and       measuresment of the wire. Using the smallest diameter dilator available,       dilation was performed with a 34m Savary dilator which the distal end of       it was initially able to traverse the stricture easily but then was met       with resistance where I suspect the full 5 mm lumen of the dilator met       the stricture, and would not pass. The  lumen appeared too narrow to       safely pass the dilator and further attempts were aborted. Relook       endoscopy after dilation attempt did show the lumen of the esophagus,       which remained quite small. A small amount of heme was noted.      The proximal esophagus was otherwise mildly dilated. Impression:               - Severe esophageal stenosis as outlined, suspect                            severe peptic stricture with edema and inflammatory                            changes based on prior endoscopic findings and                            biopsy results. As above attempted to dilate with                            563mSavory which was met with resistance. Wire was                            able to be passed without trouble. Retained food                            with impaction below proximal extent of the                            stricture is possible but could not visualize this                            nor could dilator be passed.  Overall, given 2 failed attempts at relieving                            obstruction at our facility, will transfer to                            tertiary care facility. Recommendation:           - Return patient to hospital ward for ongoing care.                           - NPO.                           - Continue present medications (IV protonix) Procedure Code(s):        --- Professional ---                           [phone], 22, Esophagogastroduodenoscopy, flexible,                            transoral; with insertion of guide wire followed by                            passage of dilator(s) through esophagus over guide                            wire                           [id number], Intraluminal dilation of strictures and/or                            obstructions (eg, esophagus), radiological                            supervision and interpretation Diagnosis Code(s):        --- Professional ---                            K22.2, Esophageal obstruction                           R13.10, Dysphagia, unspecified CPT copyright 2018 American Medical Association. All rights reserved. The codes documented in this report are preliminary and upon coder review may  be revised to meet current compliance requirements. Remo Lipps P. Lamyra Malcolm, MD 06/23/2018 12:13:16 PM This report has been signed electronically. Number of Addenda: 0

## 2018-06-23 NOTE — Anesthesia Procedure Notes (Signed)
Procedure Name: Intubation Date/Time: 06/23/2018 11:28 AM Performed by: Teressa Lower., CRNA Pre-anesthesia Checklist: Patient identified, Emergency Drugs available, Suction available and Patient being monitored Patient Re-evaluated:Patient Re-evaluated prior to induction Oxygen Delivery Method: Circle system utilized Preoxygenation: Pre-oxygenation with 100% oxygen Induction Type: IV induction, Rapid sequence and Cricoid Pressure applied Laryngoscope Size: Mac and 3 Grade View: Grade I Tube type: Oral Tube size: 7.5 mm Number of attempts: 1 Airway Equipment and Method: Stylet and Oral airway Placement Confirmation: ETT inserted through vocal cords under direct vision,  positive ETCO2 and breath sounds checked- equal and bilateral Secured at: 22 cm Tube secured with: Tape Dental Injury: Teeth and Oropharynx as per pre-operative assessment

## 2018-06-23 NOTE — Interval H&P Note (Signed)
History and Physical Interval Note:  06/23/2018 10:56 AM  Vincent Clarke  has presented today for surgery, with the diagnosis of esophageal obstruction  The various methods of treatment have been discussed with the patient and family. After consideration of risks, benefits and other options for treatment, the patient has consented to  Procedure(s): ESOPHAGOGASTRODUODENOSCOPY (EGD) WITH PROPOFOL (N/A) as a surgical intervention .  The patient's history has been reviewed, patient examined, no change in status, stable for surgery.  I have reviewed the patient's chart and labs.  Questions were answered to the patient's satisfaction.     Viviann Spare P Leonor Darnell

## 2018-06-23 NOTE — Progress Notes (Signed)
    Progress Note   Subjective  Patient feeling worse today. Can't tolerate his secretions this AM. No shortness of breath. He has been having chest discomfort and odynophagia for weeks, which is stable. CT scan done last night, barium study this AM as below.     Objective   Vital signs in last 24 hours: Temp:  [97.8 F (36.6 C)-98.6 F (37 C)] 98.5 F (36.9 C) (02/12 0737) Pulse Rate:  [46-92] 56 (02/12 0737) Resp:  [16-46] 18 (02/12 0737) BP: (105-148)/(62-82) 120/67 (02/12 0737) SpO2:  [98 %-100 %] 99 % (02/12 0737) Weight:  [70 kg] 70 kg (02/11 1005)   General:    white male in NAD Heart:  Regular rate and rhythm; no murmurs Lungs: Respirations even and unlabored, lungs CTA bilaterally Abdomen:  Soft, nontender and nondistended. Normal bowel sounds. Extremities:  Without edema. Neurologic:  Alert and oriented,  grossly normal neurologically. Psych:  Cooperative. Normal mood and affect.  Intake/Output from previous day: 02/11 0701 - 02/12 0700 In: 300 [I.V.:300] Out: -  Intake/Output this shift: No intake/output data recorded.  Lab Results: Recent Labs    06/22/18 0420  WBC 8.8  HGB 13.5  HCT 41.8  PLT 317   BMET Recent Labs    06/22/18 0420  NA 141  K 3.6  CL 106  CO2 21*  GLUCOSE 84  BUN 6  CREATININE 0.72  CALCIUM 9.5   LFT Recent Labs    06/22/18 0420  PROT 6.8  ALBUMIN 3.9  AST 15  ALT 13  ALKPHOS 58  BILITOT 0.7   PT/INR No results for input(s): LABPROT, INR in the last 72 hours.  Studies/Results: Ct Chest Wo Contrast  Result Date: 06/22/2018 CLINICAL DATA:  High-grade esophageal stricture. Evaluate for extrinsic process. EXAM: CT CHEST WITHOUT CONTRAST TECHNIQUE: Multidetector CT imaging of the chest was performed following the standard protocol without IV contrast. COMPARISON:  06/01/2018 esophagram no prior CT. Chest radiograph 05/10/2018 reviewed. FINDINGS: Cardiovascular: Aortic atherosclerosis. Tortuous thoracic aorta. Normal  heart size, without pericardial effusion. Proximal LAD and probable left main coronary artery calcification. Mediastinum/Nodes: No supraclavicular adenopathy. No mediastinal adenopathy. Hilar regions poorly evaluated without intravenous contrast. The proximal esophagus is moderately dilated and fluid-filled. This continues to the level of the mid esophagus, where an abrupt transition corresponds to the esophagram abnormality. Equivocal soft tissue fullness in this area, at least partially felt to be due to underdistention. No dominant mass extrinsic cause identified. Lungs/Pleura: No pleural fluid. Mild centrilobular and paraseptal emphysema. Relatively diffuse micronodularity is most conspicuous in the right upper lobe on image 74/4 and most likely related to respiratory bronchiolitis. No consolidation or dominant pulmonary mass. Upper Abdomen: Mild hepatic steatosis with more focal steatosis adjacent the falciform. Normal imaged portions of the spleen, stomach, pancreas, kidneys. Mild bilateral adrenal thickening may represent hyperplasia. Cholecystectomy. Musculoskeletal: Mild thoracic spondylosis. IMPRESSION: 1. At least partial esophageal obstruction at the level of the mid esophagus, as on esophagram. Equivocal concurrent soft tissue fullness in this area. Although no extrinsic cause is seen, endoscopy is again recommended to exclude neoplasia. 2. No thoracic adenopathy. 3. Aortic atherosclerosis (ICD10-I70.0) and emphysema (ICD10-J43.9). Mild relatively diffuse micronodularity is most likely related to respiratory bronchiolitis. Mild infection or aspiration could have a similar appearance (especially in the right upper lobe). 4. Age advanced coronary artery atherosclerosis. Recommend assessment of coronary risk factors and consideration of medical therapy. Electronically Signed   By: Kyle  Talbot M.D.   On: 06/22/2018 16:36     Dg Esophagus W Single Cm (sol Or Thin Ba)  Result Date: 06/23/2018 CLINICAL  DATA:  Dysphagia. EXAM: ESOPHOGRAM/BARIUM SWALLOW TECHNIQUE: Single contrast examination was performed using  thin barium. FLUOROSCOPY TIME:  Fluoroscopy Time:  54 seconds COMPARISON:  CT scan of the chest dated 06/22/2018 and esophagram dated 06/01/2018 FINDINGS: The patient ingested a single swallow of thin barium. There is complete obstruction of the esophagus just below the level of the carina. The patient remained in the upright position for 2 minutes and no barium passed through the obstruction. IMPRESSION: Interval complete obstruction of the esophagus just below the level of the carina at the level of the stricture noted on the prior exam. Electronically Signed   By: Francene Boyers M.D.   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 EGD yesterday showed severe stricture without any obvious lumen that could be seen, probed with forceps and unable to visualize the lumen. CT scan done post procedure shows some thickening and the obstruction, no obvious malignancy. Barium swallow this AM showed complete obstruction. The patient is miserable, now not tolerating his secretions.  This stricture appears to be benign based on prior biopsies of it a few weeks ago, has intervally worsened and unclear if he has some residual food or pill causing impaction but he is adamant he has been on liquid diet only since his last procedure.   I discussed the situation with the patient and his wife, and discussed this case with my colleagues. I offered him a repeat EGD today with anesthesia, with elective intubation to protect the airway, with another attempt at visualizing the lumen and dilating the stricture / relieving the obstruction if possible. He understands this  is higher than average risk procedure for perforation / bleeding, post procedure pain, etc, and wants to proceed. If this is not successful may warrant transfer to a tertiary care facility. EGD to be done ASAP as soon as anesthesia available.  Ileene Patrick, MD [facility]

## 2018-06-23 NOTE — Anesthesia Preprocedure Evaluation (Addendum)
Anesthesia Evaluation  Patient identified by MRN, date of birth, ID band Patient awake    Reviewed: Allergy & Precautions, NPO status , Patient's Chart, lab work & pertinent test results  History of Anesthesia Complications Negative for: history of anesthetic complications  Airway Mallampati: II  TM Distance: >3 FB Neck ROM: Full    Dental  (+) Edentulous Upper, Edentulous Lower   Pulmonary asthma , Current Smoker,    breath sounds clear to auscultation       Cardiovascular hypertension, + Peripheral Vascular Disease   Rhythm:Regular Rate:Normal     Neuro/Psych PSYCHIATRIC DISORDERS negative neurological ROS     GI/Hepatic Neg liver ROS, PUD, GERD  Medicated and Controlled,  Endo/Other  negative endocrine ROS  Renal/GU negative Renal ROS     Musculoskeletal  (+) Arthritis ,   Abdominal   Peds  Hematology negative hematology ROS (+)   Anesthesia Other Findings On suboxone   Reproductive/Obstetrics                             Anesthesia Physical  Anesthesia Plan  ASA: II  Anesthesia Plan: General   Post-op Pain Management:    Induction: Intravenous  PONV Risk Score and Plan: 1 and Propofol infusion and Treatment may vary due to age or medical condition  Airway Management Planned: Oral ETT  Additional Equipment: None  Intra-op Plan:   Post-operative Plan: Extubation in OR  Informed Consent: I have reviewed the patients History and Physical, chart, labs and discussed the procedure including the risks, benefits and alternatives for the proposed anesthesia with the patient or authorized representative who has indicated his/her understanding and acceptance.     Dental advisory given  Plan Discussed with: CRNA  Anesthesia Plan Comments:        Anesthesia Quick Evaluation

## 2018-06-23 NOTE — Anesthesia Postprocedure Evaluation (Signed)
Anesthesia Post Note  Patient: Vincent Clarke  Procedure(s) Performed: ESOPHAGOGASTRODUODENOSCOPY (EGD) WITH PROPOFOL (N/A ) SAVORY DILATION (N/A )     Patient location during evaluation: PACU Anesthesia Type: General Level of consciousness: sedated and patient cooperative Pain management: pain level controlled Vital Signs Assessment: post-procedure vital signs reviewed and stable Respiratory status: spontaneous breathing Cardiovascular status: stable Anesthetic complications: no    Last Vitals:  Vitals:   06/23/18 1220 06/23/18 1554  BP: 115/60 (!) 96/49  Pulse: 62 60  Resp: 17 20  Temp:  36.8 C  SpO2: 99% 97%    Last Pain:  Vitals:   06/23/18 1554  TempSrc: Oral  PainSc:                  [name]

## 2018-06-23 NOTE — Transfer of Care (Signed)
Immediate Anesthesia Transfer of Care Note  Patient: Vincent Clarke  Procedure(s) Performed: ESOPHAGOGASTRODUODENOSCOPY (EGD) WITH PROPOFOL (N/A ) SAVORY DILATION (N/A )  Patient Location: PACU  Anesthesia Type:General  Level of Consciousness: awake, alert  and oriented  Airway & Oxygen Therapy: Patient Spontanous Breathing and Patient connected to nasal cannula oxygen  Post-op Assessment: Report given to RN and Post -op Vital signs reviewed and stable  Post vital signs: Reviewed and stable  Last Vitals:  Vitals Value Taken Time  BP 122/57 06/23/2018 12:00 PM  Temp 36.4 C 06/23/2018 12:00 PM  Pulse 65 06/23/2018 12:05 PM  Resp 16 06/23/2018 12:05 PM  SpO2 100 % 06/23/2018 12:05 PM  Vitals shown include unvalidated device data.  Last Pain:  Vitals:   06/23/18 1200  TempSrc: Oral  PainSc:          Complications: No apparent anesthesia complications

## 2018-06-23 NOTE — Progress Notes (Signed)
PROGRESS NOTE  Vincent Clarke  XIP:[id number] DOB: July 13, 1965 DOA: 06/21/2018 PCP: No primary care provider on file.   Brief Narrative: Vincent Clarke is a 53 y.o. male with a history of OUD under treatment, HTN, GERD, and severe erosive esophagitis who presented with progressive dysphagia, becoming unable to tolerate liquids. This has been associated with a 20 lbs weight loss over the previous month. He was hemodynamically stable with normal electrolytes and stable hgb, started on IVF and admitted with GI consultation. EGD performed 2/11 showed high grade stenosis limiting passage of endoscope. CT chest recommended and showed at least partial esophageal obstruction at mid-esophagus also seen on esophagram. Also with equivocal concurrent soft tissue fullness in this area. Although no extrinsic cause is seen, endoscopy was again recommended to exclude neoplasia. Repeat EGD 2/12 again encountered high grade stenosis. Due to need for alternative/advanced therapies, GI was consulted [facility] GI service for transfer.   Assessment & Plan: Principal Problem:   Dysphagia Active Problems:   HTN (hypertension)   Asthma, chronic   GERD (gastroesophageal reflux disease)   Tobacco use disorder   Opioid use disorder, moderate, in early remission (HCC)   Esophagitis, erosive   Esophageal obstruction   Esophageal stricture  Severe esophagitis, high grade mid-esophageal stenosis:  - Continue IV PPI - Plan to transfer to tertiary care facility per GI.   Dysphagia due to esophageal stenosis with 20lbs weight loss, severe protein-calorie malnutrition:  - Continue IVF's, NPO, considering PEG.  - Prn zofran - Definitive treatment as above  Opioid use disorder: Following with [facility]. Next appt 2/18.  - Continue suboxone SL as prescribed as outpatient  Hypertension: Normotensive.  - No medications needed at this time  Tobacco use:  - Cessation counseling provided - Nicotine patch provided  DVT prophylaxis:  SCDs Code Status: Full Family Communication: None at bedside Disposition Plan: Anticipate transfer once bed available at [facility].  Consultants:   GI, Dr. Havery [name]  Procedures:  EGD 06/23/2018 by Dr. Havery [name]:  Impression:       - Severe esophageal stenosis as outlined, suspect                            severe peptic stricture with edema and inflammatory                            changes based on prior endoscopic findings and                            biopsy results. As above attempted to dilate with                            86m Savory which was met with resistance. Wire was                            able to be passed without trouble. Retained food                            with impaction below proximal extent of the                            stricture is possible but could not visualize this  nor could dilator be passed.                           Overall, given 2 failed attempts at relieving                            obstruction at our facility, will transfer to                            tertiary care facility. Recommendation: Return patient to hospital ward for ongoing care.                           - NPO.                           - Continue present medications (IV protonix)  EGD 06/22/2018 by Dr. Havery [name]: Impression:       - High grade esophageal stenosis with near complete                            obliteration of the lumen as described above.                            Biopsied.                           Will discuss further evaluation with radiology                            regarding esophogram or other imaging to determine                            length of stenosis (he has not had a dedicated CT                            chest recently). Prior endoscopy did not show any                            malignant changes. This patient otherwise may                            warrant a surgically placed PEG given findings and                             his ongoing weight loss, will await imaging first. Recommendation: Return patient to hospital ward for ongoing care.                           - NPO.                           - Continue IV protonix                           - Further recommendations pending imaging, we will  follow  Antimicrobials:  None   Subjective: Thirsty but can't drink water without it coming back up. No nausea. No significant abdominal pain.   Objective: Vitals:   06/23/18 1053 06/23/18 1200 06/23/18 1210 06/23/18 1220  BP:  (!) 122/57 (!) 116/53 115/60  Pulse:  71 67 62  Resp:  16 16 17   Temp:  (!) 97.5 F (36.4 C)    TempSrc:  Oral    SpO2:  100% 98% 99%  Weight: 70 kg     Height: 5' 5"  (1.651 m)       Intake/Output Summary (Last 24 hours) at 06/23/2018 1523 Last data filed at 06/23/2018 1147 Gross per 24 hour  Intake 1000 ml  Output -  Net 1000 ml   Filed Weights   06/21/18 2225 06/22/18 1005 06/23/18 1053  Weight: 70 kg 70 kg 70 kg    Gen: 53 y.o. male in no distress  Pulm: Non-labored breathing room air. Clear to auscultation bilaterally.  CV: Regular rate and rhythm. No murmur, rub, or gallop. No JVD, no pedal edema. GI: Abdomen soft, non-tender, non-distended, with normoactive bowel sounds. No organomegaly or masses felt. Ext: Warm, no deformities Skin: No rashes, lesions or ulcers Neuro: Alert and oriented. No focal neurological deficits. Psych: Judgement and insight appear normal. Mood & affect appropriate.   Data Reviewed: I have personally reviewed following labs and imaging studies  CBC: Recent Labs  Lab 06/22/18 0420  WBC 8.8  NEUTROABS 4.5  HGB 13.5  HCT 41.8  MCV 90.1  PLT 160   Basic Metabolic Panel: Recent Labs  Lab 06/22/18 0420  NA 141  K 3.6  CL 106  CO2 21*  GLUCOSE 84  BUN 6  CREATININE 0.72  CALCIUM 9.5   GFR: Estimated Creatinine Clearance: 92.9 mL/min (by C-G formula based on SCr of 0.72 mg/dL). Liver  Function Tests: Recent Labs  Lab 06/22/18 0420  AST 15  ALT 13  ALKPHOS 58  BILITOT 0.7  PROT 6.8  ALBUMIN 3.9   No results for input(s): LIPASE, AMYLASE in the last 168 hours. No results for input(s): AMMONIA in the last 168 hours. Coagulation Profile: No results for input(s): INR, PROTIME in the last 168 hours. Cardiac Enzymes: No results for input(s): CKTOTAL, CKMB, CKMBINDEX, TROPONINI in the last 168 hours. BNP (last 3 results) No results for input(s): PROBNP in the last 8760 hours. HbA1C: No results for input(s): HGBA1C in the last 72 hours. CBG: No results for input(s): GLUCAP in the last 168 hours. Lipid Profile: No results for input(s): CHOL, HDL, LDLCALC, TRIG, CHOLHDL, LDLDIRECT in the last 72 hours. Thyroid Function Tests: No results for input(s): TSH, T4TOTAL, FREET4, T3FREE, THYROIDAB in the last 72 hours. Anemia Panel: No results for input(s): VITAMINB12, FOLATE, FERRITIN, TIBC, IRON, RETICCTPCT in the last 72 hours. Urine analysis:    Component Value Date/Time   COLORURINE YELLOW 05/10/2018 1537   APPEARANCEUR CLEAR 05/10/2018 1537   LABSPEC 1.021 05/10/2018 1537   PHURINE 9.0 (H) 05/10/2018 1537   GLUCOSEU NEGATIVE 05/10/2018 1537   HGBUR NEGATIVE 05/10/2018 1537   BILIRUBINUR NEGATIVE 05/10/2018 1537   KETONESUR 80 (A) 05/10/2018 1537   PROTEINUR 100 (A) 05/10/2018 1537   UROBILINOGEN 1.0 12/19/2014 0646   NITRITE NEGATIVE 05/10/2018 1537   LEUKOCYTESUR NEGATIVE 05/10/2018 1537   No results found for this or any previous visit (from the past 240 hour(s)).    Radiology Studies: Ct Chest Wo Contrast  Result Date: 06/22/2018 CLINICAL DATA:  High-grade esophageal  stricture. Evaluate for extrinsic process. EXAM: CT CHEST WITHOUT CONTRAST TECHNIQUE: Multidetector CT imaging of the chest was performed following the standard protocol without IV contrast. COMPARISON:  06/01/2018 esophagram no prior CT. Chest radiograph 05/10/2018 reviewed. FINDINGS:  Cardiovascular: Aortic atherosclerosis. Tortuous thoracic aorta. Normal heart size, without pericardial effusion. Proximal LAD and probable left main coronary artery calcification. Mediastinum/Nodes: No supraclavicular adenopathy. No mediastinal adenopathy. Hilar regions poorly evaluated without intravenous contrast. The proximal esophagus is moderately dilated and fluid-filled. This continues to the level of the mid esophagus, where an abrupt transition corresponds to the esophagram abnormality. Equivocal soft tissue fullness in this area, at least partially felt to be due to underdistention. No dominant mass extrinsic cause identified. Lungs/Pleura: No pleural fluid. Mild centrilobular and paraseptal emphysema. Relatively diffuse micronodularity is most conspicuous in the right upper lobe on image 74/4 and most likely related to respiratory bronchiolitis. No consolidation or dominant pulmonary mass. Upper Abdomen: Mild hepatic steatosis with more focal steatosis adjacent the falciform. Normal imaged portions of the spleen, stomach, pancreas, kidneys. Mild bilateral adrenal thickening may represent hyperplasia. Cholecystectomy. Musculoskeletal: Mild thoracic spondylosis. IMPRESSION: 1. At least partial esophageal obstruction at the level of the mid esophagus, as on esophagram. Equivocal concurrent soft tissue fullness in this area. Although no extrinsic cause is seen, endoscopy is again recommended to exclude neoplasia. 2. No thoracic adenopathy. 3. Aortic atherosclerosis (ICD10-I70.0) and emphysema (ICD10-J43.9). Mild relatively diffuse micronodularity is most likely related to respiratory bronchiolitis. Mild infection or aspiration could have a similar appearance (especially in the right upper lobe). 4. Age advanced coronary artery atherosclerosis. Recommend assessment of coronary risk factors and consideration of medical therapy. Electronically Signed   By: Abigail Miyamoto M.D.   On: 06/22/2018 16:36   Dg  Esophagus Dilation  Result Date: 06/23/2018 ESOPHAGEAL DILATATION: Fluoroscopy was provided for use by the requesting physician.  No images were obtained for radiographic interpretation.  Dg Esophagus W Single Cm (sol Or Thin Ba)  Result Date: 06/23/2018 CLINICAL DATA:  Dysphagia. EXAM: ESOPHOGRAM/BARIUM SWALLOW TECHNIQUE: Single contrast examination was performed using  thin barium. FLUOROSCOPY TIME:  Fluoroscopy Time:  54 seconds COMPARISON:  CT scan of the chest dated 06/22/2018 and esophagram dated 06/01/2018 FINDINGS: The patient ingested a single swallow of thin barium. There is complete obstruction of the esophagus just below the level of the carina. The patient remained in the upright position for 2 minutes and no barium passed through the obstruction. IMPRESSION: Interval complete obstruction of the esophagus just below the level of the carina at the level of the stricture noted on the prior exam. Electronically Signed   By: Lorriane Shire M.D.   On: 06/23/2018 09:00    Scheduled Meds: . buprenorphine-naloxone  1 tablet Sublingual BID  . nicotine  14 mg Transdermal Daily  . pantoprazole (PROTONIX) IV  40 mg Intravenous Q12H   Continuous Infusions: . sodium chloride 1,000 mL (06/23/18 0733)     LOS: 1 day   Time spent: 25 minutes.  Patrecia Pour, MD [facility] [email or web address] Password [facility] 06/23/2018, 3:23 PM

## 2018-06-24 ENCOUNTER — Encounter (HOSPITAL_COMMUNITY): Payer: Self-pay | Admitting: Gastroenterology

## 2018-06-24 ENCOUNTER — Inpatient Hospital Stay (HOSPITAL_COMMUNITY): Payer: Medicaid Other

## 2018-06-24 MED ORDER — IOHEXOL 300 MG/ML  SOLN
50.0000 mL | Freq: Once | INTRAMUSCULAR | Status: AC | PRN
  Administered 2018-06-24: 50 mL via ORAL

## 2018-06-24 NOTE — Progress Notes (Signed)
Progress Note   Subjective  Patient states he is feeling much better today. He tried some liquids on his own overnight post dilation and got it down. He had 5 italian ices overnight. No pain.    Objective   Vital signs in last 24 hours: Temp:  [97.9 F (36.6 C)-98.6 F (37 C)] 97.9 F (36.6 C) (02/13 1220) Pulse Rate:  [50-61] 50 (02/13 1220) Resp:  [16-20] 16 (02/13 1220) BP: (96-125)/(49-75) 125/75 (02/13 1220) SpO2:  [96 %-100 %] 100 % (02/13 1220) Last BM Date: 06/22/18 General:    white male in NAD Heart:  Regular rate and rhythm; no murmurs Lungs: Respirations even and unlabored, lungs CTA bilaterally Abdomen:  Soft, nontender and nondistended. Extremities:  Without edema. Neurologic:  Alert and oriented,  grossly normal neurologically. Psych:  Cooperative. Normal mood and affect.  Intake/Output from previous day: 02/12 0701 - 02/13 0700 In: 2464.8 [I.V.:2464.8] Out: -  Intake/Output this shift: No intake/output data recorded.  Lab Results: Recent Labs    06/22/18 0420  WBC 8.8  HGB 13.5  HCT 41.8  PLT 317   BMET Recent Labs    06/22/18 0420  NA 141  K 3.6  CL 106  CO2 21*  GLUCOSE 84  BUN 6  CREATININE 0.72  CALCIUM 9.5   LFT Recent Labs    06/22/18 0420  PROT 6.8  ALBUMIN 3.9  AST 15  ALT 13  ALKPHOS 58  BILITOT 0.7   PT/INR No results for input(s): LABPROT, INR in the last 72 hours.  Studies/Results: Ct Chest Wo Contrast  Result Date: 06/22/2018 CLINICAL DATA:  High-grade esophageal stricture. Evaluate for extrinsic process. EXAM: CT CHEST WITHOUT CONTRAST TECHNIQUE: Multidetector CT imaging of the chest was performed following the standard protocol without IV contrast. COMPARISON:  06/01/2018 esophagram no prior CT. Chest radiograph 05/10/2018 reviewed. FINDINGS: Cardiovascular: Aortic atherosclerosis. Tortuous thoracic aorta. Normal heart size, without pericardial effusion. Proximal LAD and probable left main coronary artery  calcification. Mediastinum/Nodes: No supraclavicular adenopathy. No mediastinal adenopathy. Hilar regions poorly evaluated without intravenous contrast. The proximal esophagus is moderately dilated and fluid-filled. This continues to the level of the mid esophagus, where an abrupt transition corresponds to the esophagram abnormality. Equivocal soft tissue fullness in this area, at least partially felt to be due to underdistention. No dominant mass extrinsic cause identified. Lungs/Pleura: No pleural fluid. Mild centrilobular and paraseptal emphysema. Relatively diffuse micronodularity is most conspicuous in the right upper lobe on image 74/4 and most likely related to respiratory bronchiolitis. No consolidation or dominant pulmonary mass. Upper Abdomen: Mild hepatic steatosis with more focal steatosis adjacent the falciform. Normal imaged portions of the spleen, stomach, pancreas, kidneys. Mild bilateral adrenal thickening may represent hyperplasia. Cholecystectomy. Musculoskeletal: Mild thoracic spondylosis. IMPRESSION: 1. At least partial esophageal obstruction at the level of the mid esophagus, as on esophagram. Equivocal concurrent soft tissue fullness in this area. Although no extrinsic cause is seen, endoscopy is again recommended to exclude neoplasia. 2. No thoracic adenopathy. 3. Aortic atherosclerosis (ICD10-I70.0) and emphysema (ICD10-J43.9). Mild relatively diffuse micronodularity is most likely related to respiratory bronchiolitis. Mild infection or aspiration could have a similar appearance (especially in the right upper lobe). 4. Age advanced coronary artery atherosclerosis. Recommend assessment of coronary risk factors and consideration of medical therapy. Electronically Signed   By: Abigail Miyamoto M.D.   On: 06/22/2018 16:36   Dg Esophagus Dilation  Result Date: 06/23/2018 ESOPHAGEAL DILATATION: Fluoroscopy was provided for use by the requesting  physician.  No images were obtained for radiographic  interpretation.  Dg Esophagus W Single Cm (sol Or Thin Ba)  Result Date: 06/24/2018 CLINICAL DATA:  Post dilation of an esophageal stricture. EXAM: ESOPHOGRAM/BARIUM SWALLOW TECHNIQUE: Single contrast examination was performed using  Omnipaque 300. FLUOROSCOPY TIME:  Fluoroscopy Time:  54 seconds Radiation Exposure Index (if provided by the fluoroscopic device): 3.2 mGy Number of Acquired Spot Images: 0 COMPARISON:  06/23/2018 FINDINGS: The patient swallowed water-soluble contrast without difficulties. The contrast material outlines a smooth dilated proximal esophagus, which comes to a 15 mm long tight stricture, just below the level of the carina, where the lumen of the esophagus narrows to 1-2 mm. Contrast material is seen passing through the stricture and outlining normal in caliber distal esophagus, and normal in appearance gastroesophageal junction. Contrast is seen within the stomach. 4 minutes after administration of the oral contrast, there was a small residual within the dilated portion of the proximal esophagus, above the stricture. No extraluminal extravasation is seen. IMPRESSION: 15 mm long smooth tight esophageal stricture just below the level of the carina. Water-soluble contrast was able to pass through the stricture, with small amount of residual within the dilated proximal esophagus. Electronically Signed   By: Fidela Salisbury M.D.   On: 06/24/2018 10:02   Dg Esophagus W Single Cm (sol Or Thin Ba)  Result Date: 06/23/2018 CLINICAL DATA:  Dysphagia. EXAM: ESOPHOGRAM/BARIUM SWALLOW TECHNIQUE: Single contrast examination was performed using  thin barium. FLUOROSCOPY TIME:  Fluoroscopy Time:  54 seconds COMPARISON:  CT scan of the chest dated 06/22/2018 and esophagram dated 06/01/2018 FINDINGS: The patient ingested a single swallow of thin barium. There is complete obstruction of the esophagus just below the level of the carina. The patient remained in the upright position for 2 minutes and  no barium passed through the obstruction. IMPRESSION: Interval complete obstruction of the esophagus just below the level of the carina at the level of the stricture noted on the prior exam. Electronically Signed   By: Lorriane Shire M.D.   On: 06/23/2018 09:00       Assessment / Plan:    53 y/o male with severe high grade mid esophageal stricture. Endoscopies on 2/11 showed complete obliteration of the lumen and no attempt at dilation performed at that time. CT scan showed thickening of the area and obstruction but no evidence of malignancy. Follow up barium study confirmed complete obstruction. 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  Yesterday, was able to place wire through the stricture and into the stomach, but the dilator was met with resistance at 60m. Post dilation attempt he has been tolerating liquids. Repeat barium study today shows the stricture is roughly 1.5cm long and with a lumen of 1-236m   I have spoken with our cardiothoracic surgeons who did not feel comfortable attempting dilation in the OR here. I have spoken with [facility] who accepted the patient, they were going to discuss the case with their CT surgeons to help with more aggressive dilation in the OR. Unfortunately they do not estimate a bed to be available for 7-10 days. [facility] I discussed options with him. He's lost of lot of weight, I don't think he will be able to eat any solid foods for quite a bit of time. I spoke with IR and they think they may be able to offer a button push PEG in order to get  good access for enteral nutrition and avoid a surgically placed PEG. If we do this he would be able to go home soon and follow up with [facility] for tertiary care center evaluation as outpatient. This is his preference at this time so he can get out of the hospital sooner. IR will evaluate for PEG. I may try another attempt at dilation before  he leaves now that I have a better sense of his anatomy based on recent barium swallow. He agreed. Continue clears for now, no oral meds otherwise.  Gordonville Cellar, MD [facility]

## 2018-06-24 NOTE — Progress Notes (Signed)
PROGRESS NOTE  Vincent Clarke  HYI:[id number] DOB: November 04, 1965 DOA: 06/21/2018 PCP: No primary care provider on file.   Brief Narrative: Vincent Clarke is a 53 y.o. male with a history of OUD under treatment, HTN, GERD, and severe erosive esophagitis who presented with progressive dysphagia, becoming unable to tolerate liquids. This has been associated with a 20 lbs weight loss over the previous month. He was hemodynamically stable with normal electrolytes and stable hgb, started on IVF and admitted with GI consultation. EGD performed 2/11 showed high grade stenosis limiting passage of endoscope. CT chest recommended and showed at least partial esophageal obstruction at mid-esophagus also seen on esophagram. Also with equivocal concurrent soft tissue fullness in this area. Although no extrinsic cause is seen, endoscopy was again recommended to exclude neoplasia. Repeat EGD 2/12 again encountered high grade stenosis. Due to need for alternative/advanced therapies, GI was consulted [facility] service for transfer.   Assessment & Plan: Principal Problem:   Dysphagia Active Problems:   HTN (hypertension)   Asthma, chronic   GERD (gastroesophageal reflux disease)   Tobacco use disorder   Opioid use disorder, moderate, in early remission (HCC)   Esophagitis, erosive   Esophageal obstruction   Esophageal stricture  Severe esophagitis, high grade mid-esophageal stenosis:  - Continue IV PPI - improved barium swallow 2/13 with 1.5cm long stenosis with 1-36m opening s/p dilatation at EGD 2/12. Continue clear liquids, no pills per GI.  Dysphagia due to esophageal stenosis with 20lbs weight loss, severe protein-calorie malnutrition:  - Continue IVF's, clear liquids, IR will evaluate for PEG.  - Prn zofran - Definitive treatment as above  Opioid use disorder: Following with [facility]. Next appt 2/18.  - Continue suboxone SL as prescribed as outpatient  Hypertension: Normotensive.  - No medications needed at  this time  Tobacco use:  - Cessation counseling provided - Nicotine patch provided  DVT prophylaxis: SCDs Code Status: Full Family Communication: None at bedside Disposition Plan: Possible discharge home with outpatient GI follow up at tertiary care center pending PEG placement here.  Consultants:   GI, Dr. AHavery [name] Procedures:  EGD 06/23/2018 by Dr. AHavery [name]  Impression:       - Severe esophageal stenosis as outlined, suspect                            severe peptic stricture with edema and inflammatory                            changes based on prior endoscopic findings and                            biopsy results. As above attempted to dilate with                            541mSavory which was met with resistance. Wire was                            able to be passed without trouble. Retained food                            with impaction below proximal extent of the  stricture is possible but could not visualize this                            nor could dilator be passed.                           Overall, given 2 failed attempts at relieving                            obstruction at our facility, will transfer to                            tertiary care facility. Recommendation: Return patient to hospital ward for ongoing care.                           - NPO.                           - Continue present medications (IV protonix)  EGD 06/22/2018 by Dr. Havery [name]: Impression:       - High grade esophageal stenosis with near complete                            obliteration of the lumen as described above.                            Biopsied.                           Will discuss further evaluation with radiology                            regarding esophogram or other imaging to determine                            length of stenosis (he has not had a dedicated CT                            chest recently). Prior endoscopy did not show any                             malignant changes. This patient otherwise may                            warrant a surgically placed PEG given findings and                            his ongoing weight loss, will await imaging first. Recommendation: Return patient to hospital ward for ongoing care.                           - NPO.                           - Continue IV protonix                           -  Further recommendations pending imaging, we will                            follow  Antimicrobials:  None   Subjective: Very excited that he was able to keep down italian ice overnight without nausea or vomiting. It didn't come back up. No other complaints.  Objective: Vitals:   06/23/18 2321 06/24/18 0316 06/24/18 0804 06/24/18 1220  BP: (!) 101/50 (!) 97/56 111/68 125/75  Pulse: (!) 53 (!) 51 (!) 51 (!) 50  Resp: _0 Temp: 98.4 F (36.9 C) 98.1 F (36.7 C) 98.6 F (37 C) 97.9 F (36.6 C)  TempSrc: Oral Oral Oral Oral  SpO2: 96% 96% 96% 100%  Weight:      Height:        Intake/Output Summary (Last 24 hours) at 06/24/2018 1340 Last data filed at 06/23/2018 2322 Gross per 24 hour  Intake 1464.78 ml  Output -  Net 1464.78 ml   Filed Weights   06/21/18 2225 06/22/18 1005 06/23/18 1053  Weight: 70 kg 70 kg 70 kg   Gen: 53 y.o. male in no distress Pulm: Nonlabored breathing room air. Clear. CV: Regular rate and rhythm. No murmur, rub, or gallop. No JVD, no dependent edema. GI: Abdomen soft, non-tender, non-distended, with normoactive bowel sounds.  Ext: Warm, no deformities Skin: No rashes, lesions or ulcers on visualized skin. Neuro: Alert and oriented. No focal neurological deficits. Psych: Judgement and insight appear fair. Mood euthymic & affect congruent. Behavior is appropriate.    Data Reviewed: I have personally reviewed following labs and imaging studies  CBC: Recent Labs  Lab 06/22/18 0420  WBC 8.8  NEUTROABS 4.5  HGB 13.5  HCT 41.8  MCV 90.1    PLT 037   Basic Metabolic Panel: Recent Labs  Lab 06/22/18 0420  NA 141  K 3.6  CL 106  CO2 21*  GLUCOSE 84  BUN 6  CREATININE 0.72  CALCIUM 9.5   GFR: Estimated Creatinine Clearance: 92.9 mL/min (by C-G formula based on SCr of 0.72 mg/dL). Liver Function Tests: Recent Labs  Lab 06/22/18 0420  AST 15  ALT 13  ALKPHOS 58  BILITOT 0.7  PROT 6.8  ALBUMIN 3.9   No results for input(s): LIPASE, AMYLASE in the last 168 hours. No results for input(s): AMMONIA in the last 168 hours. Coagulation Profile: No results for input(s): INR, PROTIME in the last 168 hours. Cardiac Enzymes: No results for input(s): CKTOTAL, CKMB, CKMBINDEX, TROPONINI in the last 168 hours. BNP (last 3 results) No results for input(s): PROBNP in the last 8760 hours. HbA1C: No results for input(s): HGBA1C in the last 72 hours. CBG: No results for input(s): GLUCAP in the last 168 hours. Lipid Profile: No results for input(s): CHOL, HDL, LDLCALC, TRIG, CHOLHDL, LDLDIRECT in the last 72 hours. Thyroid Function Tests: No results for input(s): TSH, T4TOTAL, FREET4, T3FREE, THYROIDAB in the last 72 hours. Anemia Panel: No results for input(s): VITAMINB12, FOLATE, FERRITIN, TIBC, IRON, RETICCTPCT in the last 72 hours. Urine analysis:    Component Value Date/Time   COLORURINE YELLOW 05/10/2018 1537   APPEARANCEUR CLEAR 05/10/2018 1537   LABSPEC 1.021 05/10/2018 1537   PHURINE 9.0 (H) 05/10/2018 1537   GLUCOSEU NEGATIVE 05/10/2018 1537   HGBUR NEGATIVE 05/10/2018 1537   BILIRUBINUR NEGATIVE 05/10/2018 1537   KETONESUR 80 (A) 05/10/2018 1537   PROTEINUR 100 (A) 05/10/2018 1537   UROBILINOGEN 1.0 12/19/2014  [place] 05/10/2018 1537   LEUKOCYTESUR NEGATIVE 05/10/2018 1537   No results found for this or any previous visit (from the past 240 hour(s)).    Radiology Studies: Ct Chest Wo Contrast  Result Date: 06/22/2018 CLINICAL DATA:  High-grade esophageal stricture. Evaluate for  extrinsic process. EXAM: CT CHEST WITHOUT CONTRAST TECHNIQUE: Multidetector CT imaging of the chest was performed following the standard protocol without IV contrast. COMPARISON:  06/01/2018 esophagram no prior CT. Chest radiograph 05/10/2018 reviewed. FINDINGS: Cardiovascular: Aortic atherosclerosis. Tortuous thoracic aorta. Normal heart size, without pericardial effusion. Proximal LAD and probable left main coronary artery calcification. Mediastinum/Nodes: No supraclavicular adenopathy. No mediastinal adenopathy. Hilar regions poorly evaluated without intravenous contrast. The proximal esophagus is moderately dilated and fluid-filled. This continues to the level of the mid esophagus, where an abrupt transition corresponds to the esophagram abnormality. Equivocal soft tissue fullness in this area, at least partially felt to be due to underdistention. No dominant mass extrinsic cause identified. Lungs/Pleura: No pleural fluid. Mild centrilobular and paraseptal emphysema. Relatively diffuse micronodularity is most conspicuous in the right upper lobe on image 74/4 and most likely related to respiratory bronchiolitis. No consolidation or dominant pulmonary mass. Upper Abdomen: Mild hepatic steatosis with more focal steatosis adjacent the falciform. Normal imaged portions of the spleen, stomach, pancreas, kidneys. Mild bilateral adrenal thickening may represent hyperplasia. Cholecystectomy. Musculoskeletal: Mild thoracic spondylosis. IMPRESSION: 1. At least partial esophageal obstruction at the level of the mid esophagus, as on esophagram. Equivocal concurrent soft tissue fullness in this area. Although no extrinsic cause is seen, endoscopy is again recommended to exclude neoplasia. 2. No thoracic adenopathy. 3. Aortic atherosclerosis (ICD10-I70.0) and emphysema (ICD10-J43.9). Mild relatively diffuse micronodularity is most likely related to respiratory bronchiolitis. Mild infection or aspiration could have a similar  appearance (especially in the right upper lobe). 4. Age advanced coronary artery atherosclerosis. Recommend assessment of coronary risk factors and consideration of medical therapy. Electronically Signed   By: Abigail Miyamoto M.D.   On: 06/22/2018 16:36   Dg Esophagus Dilation  Result Date: 06/23/2018 ESOPHAGEAL DILATATION: Fluoroscopy was provided for use by the requesting physician.  No images were obtained for radiographic interpretation.  Dg Esophagus W Single Cm (sol Or Thin Ba)  Result Date: 06/24/2018 CLINICAL DATA:  Post dilation of an esophageal stricture. EXAM: ESOPHOGRAM/BARIUM SWALLOW TECHNIQUE: Single contrast examination was performed using  Omnipaque 300. FLUOROSCOPY TIME:  Fluoroscopy Time:  54 seconds Radiation Exposure Index (if provided by the fluoroscopic device): 3.2 mGy Number of Acquired Spot Images: 0 COMPARISON:  06/23/2018 FINDINGS: The patient swallowed water-soluble contrast without difficulties. The contrast material outlines a smooth dilated proximal esophagus, which comes to a 15 mm long tight stricture, just below the level of the carina, where the lumen of the esophagus narrows to 1-2 mm. Contrast material is seen passing through the stricture and outlining normal in caliber distal esophagus, and normal in appearance gastroesophageal junction. Contrast is seen within the stomach. 4 minutes after administration of the oral contrast, there was a small residual within the dilated portion of the proximal esophagus, above the stricture. No extraluminal extravasation is seen. IMPRESSION: 15 mm long smooth tight esophageal stricture just below the level of the carina. Water-soluble contrast was able to pass through the stricture, with small amount of residual within the dilated proximal esophagus. Electronically Signed   By: Fidela Salisbury M.D.   On: 06/24/2018 10:02   Dg Esophagus W Single Cm (sol Or Thin Ba)  Result Date: 06/23/2018 CLINICAL  DATA:  Dysphagia. EXAM:  ESOPHOGRAM/BARIUM SWALLOW TECHNIQUE: Single contrast examination was performed using  thin barium. FLUOROSCOPY TIME:  Fluoroscopy Time:  54 seconds COMPARISON:  CT scan of the chest dated 06/22/2018 and esophagram dated 06/01/2018 FINDINGS: The patient ingested a single swallow of thin barium. There is complete obstruction of the esophagus just below the level of the carina. The patient remained in the upright position for 2 minutes and no barium passed through the obstruction. IMPRESSION: Interval complete obstruction of the esophagus just below the level of the carina at the level of the stricture noted on the prior exam. Electronically Signed   By: Lorriane Shire M.D.   On: 06/23/2018 09:00    Scheduled Meds: . buprenorphine-naloxone  1 tablet Sublingual BID  . nicotine  14 mg Transdermal Daily  . pantoprazole (PROTONIX) IV  40 mg Intravenous Q12H   Continuous Infusions: . sodium chloride 100 mL/hr at 06/24/18 0439     LOS: 2 days   Time spent: 25 minutes.  Patrecia Pour, MD [facility] [email or web address] Password [email or web address] 06/24/2018, 1:40 PM

## 2018-06-25 LAB — CBC
HCT: 33 % — ABNORMAL LOW (ref 39.0–52.0)
Hemoglobin: 11.4 g/dL — ABNORMAL LOW (ref 13.0–17.0)
MCH: 30.1 pg (ref 26.0–34.0)
MCHC: 34.5 g/dL (ref 30.0–36.0)
MCV: 87.1 fL (ref 80.0–100.0)
Platelets: 241 10*3/uL (ref 150–400)
RBC: 3.79 MIL/uL — ABNORMAL LOW (ref 4.22–5.81)
RDW: 13.1 % (ref 11.5–15.5)
WBC: 6.3 10*3/uL (ref 4.0–10.5)
nRBC: 0 % (ref 0.0–0.2)

## 2018-06-25 LAB — BASIC METABOLIC PANEL
Anion gap: 5 (ref 5–15)
BUN: <5 mg/dL — ABNORMAL LOW (ref 6–20)
CO2: 22 mmol/L (ref 22–32)
Calcium: 8.4 mg/dL — ABNORMAL LOW (ref 8.9–10.3)
Chloride: 113 mmol/L — ABNORMAL HIGH (ref 98–111)
Creatinine, Ser: 0.59 mg/dL — ABNORMAL LOW (ref 0.61–1.24)
GFR calc Af Amer: >60 mL/min (ref >60)
GFR calc non Af Amer: >60 mL/min (ref >60)
Glucose, Bld: 97 mg/dL (ref 70–99)
Potassium: 3.3 mmol/L — ABNORMAL LOW (ref 3.5–5.1)
Sodium: 140 mmol/L (ref 135–145)

## 2018-06-25 MED ORDER — CEFAZOLIN SODIUM-DEXTROSE 2-4 GM/100ML-% IV SOLN
2.0000 g | INTRAVENOUS | Status: AC
  Administered 2018-06-27: 2 g via INTRAVENOUS

## 2018-06-25 NOTE — Progress Notes (Signed)
Leta Jungling -RN from transfer Unit in [facility] called to get update on Pt. Vincent Clarke. I gave the nurse Pt's  latest vital signs as of 2000, his current diet and how he is tolerating the diet.

## 2018-06-25 NOTE — Care Management Note (Signed)
Case Management Note  Patient Details  Name: Vincent Clarke MRN: [id number] Date of Birth: Dec 12, 1965  Subjective/Objective:    Pt admitted with dysphagia. He is from home with his spouse.  DME: cane No issues with meds. Pt uses [employer] and receives assistance. He is waiting on his orange card No issue with transportation.     PCP: [facility]            Action/Plan: Plan is for PEG tube placement on Monday. CM following for d/c needs, physician orders.  Expected Discharge Date:                  Expected Discharge Plan:     In-House Referral:     Discharge planning Services  CM Consult  Post Acute Care Choice:    Choice offered to:     DME Arranged:    DME Agency:     HH Arranged:    HH Agency:     Status of Service:  In process, will continue to follow  If discussed at Long Length of Stay Meetings, dates discussed:    Additional Comments:  Kermit Balo, RN 06/25/2018, 3:58 PM
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
## 2018-06-25 NOTE — Progress Notes (Signed)
Caryn Bee from transfer center in [facility] called, asked for update on Pt, [place], [place], Caryn Bee stated "Since he is tolerating clear liquid he is doing a lot better". He went on to ask "Does he still need transfer? Tell them call back if he still needs transfer"

## 2018-06-25 NOTE — Plan of Care (Signed)
Pt is progressing toward desired goal 

## 2018-06-25 NOTE — Progress Notes (Signed)
Progress Note   Subjective  Patient is intermittently able to swallow liquids. Had some difficulty this AM. Evaluated by IR for a push PEG which is pending.    Objective   Vital signs in last 24 hours: Temp:  [97.9 F (36.6 C)-98.3 F (36.8 C)] 98.3 F (36.8 C) (02/14 0407) Pulse Rate:  [45-57] 50 (02/14 1009) Resp:  [15-18] 16 (02/14 1009) BP: (108-132)/(57-85) 132/85 (02/14 1009) SpO2:  [94 %-100 %] 99 % (02/14 1009) Last BM Date: 06/24/18 General:    white male in NAD Heart:  Regular rate and rhythm; no murmurs Lungs: Respirations even and unlabored, lungs CTA bilaterally Abdomen:  Soft, nontender and nondistended. Extremities:  Without edema. Neurologic:  Alert and oriented,  grossly normal neurologically. Psych:  Cooperative. Normal mood and affect.  Intake/Output from previous day: 02/13 0701 - 02/14 0700 In: 2642.5 [I.V.:2642.5] Out: -  Intake/Output this shift: No intake/output data recorded.  Lab Results: Recent Labs    06/25/18 0607  WBC 6.3  HGB 11.4*  HCT 33.0*  PLT 241   BMET Recent Labs    06/25/18 0607  NA 140  K 3.3*  CL 113*  CO2 22  GLUCOSE 97  BUN <5*  CREATININE 0.59*  CALCIUM 8.4*   LFT No results for input(s): PROT, ALBUMIN, AST, ALT, ALKPHOS, BILITOT, BILIDIR, IBILI in the last 72 hours. PT/INR No results for input(s): LABPROT, INR in the last 72 hours.  Studies/Results: Dg Esophagus Dilation  Result Date: 06/23/2018 ESOPHAGEAL DILATATION: Fluoroscopy was provided for use by the requesting physician.  No images were obtained for radiographic interpretation.  Dg Esophagus W Single Cm (sol Or Thin Ba)  Result Date: 06/24/2018 CLINICAL DATA:  Post dilation of an esophageal stricture. EXAM: ESOPHOGRAM/BARIUM SWALLOW TECHNIQUE: Single contrast examination was performed using  Omnipaque 300. FLUOROSCOPY TIME:  Fluoroscopy Time:  54 seconds Radiation Exposure Index (if provided by the fluoroscopic device): 3.2 mGy Number of  Acquired Spot Images: 0 COMPARISON:  06/23/2018 FINDINGS: The patient swallowed water-soluble contrast without difficulties. The contrast material outlines a smooth dilated proximal esophagus, which comes to a 15 mm long tight stricture, just below the level of the carina, where the lumen of the esophagus narrows to 1-2 mm. Contrast material is seen passing through the stricture and outlining normal in caliber distal esophagus, and normal in appearance gastroesophageal junction. Contrast is seen within the stomach. 4 minutes after administration of the oral contrast, there was a small residual within the dilated portion of the proximal esophagus, above the stricture. No extraluminal extravasation is seen. IMPRESSION: 15 mm long smooth tight esophageal stricture just below the level of the carina. Water-soluble contrast was able to pass through the stricture, with small amount of residual within the dilated proximal esophagus. Electronically Signed   By: Fidela Salisbury M.D.   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 Endoscopy on 2/11 showed complete obliteration of the lumen and no attempt at dilation performed at that time. CT scan showed thickening of the area and obstruction but no evidence of malignancy. Follow up barium study confirmed complete obstruction. Biopsies during the initial exam showed severe inflammatory peptic stricture without malignancy.  EGD 2/12 was able to place wire through the stricture and into the stomach, but the dilator was met with resistance at 55m. Post dilation attempt he has tolerated liquids intermittently. Repeat barium study after dilation showed  the stricture is roughly 1.5cm long and with a lumen of 1-8m.   See prior note for details of his case. [facility]. Otherwise, I spoke with  IR about a push PEG to get him enteral nutrition and allow him to be discharged and follow up as outpatient at a tertiary care facility. His preference is to have a PEG placed (nonsurgically if possible), as I don't think he will be able to eat food in any significant capacity for some time. I spoke with Dr. WEarleen Newport if they need help getting the catheter through the stricture and into the stomach for insufflation I can assist with that endoscopically. He thinks they will be able to do it on their own but standing by if needed. If this works, as outpatient we will consult [facility] or [facility] for further treatment. They agreed with the plan. I have made him NPO for possible push PEG later today  SCarolina Cellar MD [facility]

## 2018-06-25 NOTE — Progress Notes (Signed)
[facility] PROGRESS NOTE    Progress Note  Vincent Clarke  WTU:[id number] DOB: 14-Sep-1965 DOA: 06/21/2018 PCP: No primary care provider on file.     Brief Narrative:   Vincent Clarke is an 53 y.o. male past medical history of use, hypertension, severe erosive esophagitis who presents with progressive dysphagia associated with a 20 pound weight loss over the previous months, he was admitted to the hospital electrolytes were corrected, his hemoglobin remained stable GI was consulted to perform an EGD on 06/22/2018 that showed high-grade stenosis, CT scan of the chest showed at least partial esophageal obstruction at the mid esophagus also seen on esophagogram.  Repeated EGD on 05/01/2019 encounter high-grade stenosis, due to the need of alternative therapy GI consulted took services for possible transfer.  Assessment/Plan:   Dysphagia due to   Esophagitis, erosive/ Esophageal stenosis: Continue PPI. GI was consulted they have scoped the patient twice.  The patient is tolerated ice cream, the explained to the patient that IR might be able to place a PEG tube for enteral nutrition this way surgical PEG placement could be avoided.  First to do this and follow-up with [facility] as an outpatient for further evaluation. Continue IV fluids PRN Zofran.  Opioid use disorder: Continue Suboxone as prescribed as an outpatient  Essential hypertension: Blood pressure at goal.   DVT prophylaxis: lovenox Family Communication:wife and son Disposition Plan/Barrier to D/C: home once enteral feeding establish Code Status:     Code Status Orders  (From admission, onward)         Start     Ordered   06/22/18 0804  Full code  Continuous     06/22/18 0803        Code Status History    Date Active Date Inactive Code Status Order ID Comments User Context   06/01/2018 1801 06/05/2018 1514 Full Code [id number]  Inez Catalina, MD ED   05/10/2018 2133 05/15/2018 1820 Full Code [id number]  Duayne Cal, NP  ED   06/05/2015 1711 06/06/2015 2316 Full Code [id number]  Janalee Dane, PA-C Inpatient   12/26/2014 1556 12/27/2014 1826 Full Code [id number]  Janalee Dane, PA-C Inpatient   12/19/2014 0651 12/26/2014 1556 Full Code [id number]  Janalee Dane, PA-C Inpatient   03/20/2014 1836 03/21/2014 1323 Full Code [id number]  Atilano Ina, MD Inpatient        IV Access:    Peripheral IV   Procedures and diagnostic studies:   Dg Esophagus Dilation  Result Date: 06/23/2018 ESOPHAGEAL DILATATION: Fluoroscopy was provided for use by the requesting physician.  No images were obtained for radiographic interpretation.  Dg Esophagus W Single Cm (sol Or Thin Ba)  Result Date: 06/24/2018 CLINICAL DATA:  Post dilation of an esophageal stricture. EXAM: ESOPHOGRAM/BARIUM SWALLOW TECHNIQUE: Single contrast examination was performed using  Omnipaque 300. FLUOROSCOPY TIME:  Fluoroscopy Time:  54 seconds Radiation Exposure Index (if provided by the fluoroscopic device): 3.2 mGy Number of Acquired Spot Images: 0 COMPARISON:  06/23/2018 FINDINGS: The patient swallowed water-soluble contrast without difficulties. The contrast material outlines a smooth dilated proximal esophagus, which comes to a 15 mm long tight stricture, just below the level of the carina, where the lumen of the esophagus narrows to 1-2 mm. Contrast material is seen passing through the stricture and outlining normal in caliber distal esophagus, and normal in appearance gastroesophageal junction. Contrast is seen within the stomach. 4 minutes after administration of the oral contrast, there was a small residual within the dilated portion  of the proximal esophagus, above the stricture. No extraluminal extravasation is seen. IMPRESSION: 15 mm long smooth tight esophageal stricture just below the level of the carina. Water-soluble contrast was able to pass through the stricture, with small amount of residual within the dilated proximal esophagus. Electronically  Signed   By: Ted Mcalpineobrinka  Dimitrova M.D.   On: 06/24/2018 10:02     Medical Consultants:    None.  Anti-Infectives:   None  Subjective:    Vincent Clarke has not thrown up has tolerated his broth.  Objective:    Vitals:   06/24/18 1636 06/24/18 2027 06/24/18 2329 06/25/18 0407  BP: 131/75 (!) 108/57 122/74 109/74  Pulse: (!) 57 (!) 56 (!) 52 (!) 45  Resp: 15 18 18 18   Temp: 98.1 F (36.7 C) 98.1 F (36.7 C) 98.2 F (36.8 C) 98.3 F (36.8 C)  TempSrc: Oral Oral Oral Oral  SpO2: 100% 95% 99% 94%  Weight:      Height:        Intake/Output Summary (Last 24 hours) at 06/25/2018 0855 Last data filed at 06/25/2018 0200 Gross per 24 hour  Intake 2642.45 ml  Output -  Net 2642.45 ml   Filed Weights   06/21/18 2225 06/22/18 1005 06/23/18 1053  Weight: 70 kg 70 kg 70 kg    Exam: General exam: In no acute distress. Respiratory system: Good air movement and clear to auscultation. Cardiovascular system: S1 & S2 heard, RRR.  Gastrointestinal system: Positive bowel sounds, soft nontender nondistended Central nervous system: Alert and oriented. No focal neurological deficits. Extremities: No pedal edema. Skin: No rashes, lesions or ulcers   Data Reviewed:    Labs: Basic Metabolic Panel: Recent Labs  Lab 06/22/18 0420 06/25/18 0607  NA 141 140  K 3.6 3.3*  CL 106 113*  CO2 21* 22  GLUCOSE 84 97  BUN 6 <5*  CREATININE 0.72 0.59*  CALCIUM 9.5 8.4*   GFR Estimated Creatinine Clearance: 92.9 mL/min (A) (by C-G formula based on SCr of 0.59 mg/dL (L)). Liver Function Tests: Recent Labs  Lab 06/22/18 0420  AST 15  ALT 13  ALKPHOS 58  BILITOT 0.7  PROT 6.8  ALBUMIN 3.9   No results for input(s): LIPASE, AMYLASE in the last 168 hours. No results for input(s): AMMONIA in the last 168 hours. Coagulation profile No results for input(s): INR, PROTIME in the last 168 hours.  CBC: Recent Labs  Lab 06/22/18 0420 06/25/18 0607  WBC 8.8 6.3  NEUTROABS 4.5   --   HGB 13.5 11.4*  HCT 41.8 33.0*  MCV 90.1 87.1  PLT 317 241   Cardiac Enzymes: No results for input(s): CKTOTAL, CKMB, CKMBINDEX, TROPONINI in the last 168 hours. BNP (last 3 results) No results for input(s): PROBNP in the last 8760 hours. CBG: No results for input(s): GLUCAP in the last 168 hours. D-Dimer: No results for input(s): DDIMER in the last 72 hours. Hgb A1c: No results for input(s): HGBA1C in the last 72 hours. Lipid Profile: No results for input(s): CHOL, HDL, LDLCALC, TRIG, CHOLHDL, LDLDIRECT in the last 72 hours. Thyroid function studies: No results for input(s): TSH, T4TOTAL, T3FREE, THYROIDAB in the last 72 hours.  Invalid input(s): FREET3 Anemia work up: No results for input(s): VITAMINB12, FOLATE, FERRITIN, TIBC, IRON, RETICCTPCT in the last 72 hours. Sepsis Labs: Recent Labs  Lab 06/22/18 0420 06/25/18 0607  WBC 8.8 6.3   Microbiology No results found for this or any previous visit (from the past 240  hour(s)).   Medications:   . buprenorphine-naloxone  1 tablet Sublingual BID  . nicotine  14 mg Transdermal Daily  . pantoprazole (PROTONIX) IV  40 mg Intravenous Q12H   Continuous Infusions: . sodium chloride 100 mL/hr at 06/25/18 0052      LOS: 3 days   Marinda Elk  [facility]  06/25/2018, 8:55 AM

## 2018-06-26 LAB — PROTIME-INR
INR: 1.16
Prothrombin Time: 14.7 seconds (ref 11.4–15.2)

## 2018-06-26 LAB — APTT: aPTT: 35 seconds (ref 24–36)

## 2018-06-26 NOTE — Progress Notes (Addendum)
Progress Note   Subjective  Patient is able to tolerate liquids again. IR was not able to place PEG yesterday, told possibly Monday. Patient anxious and getting frustrated by prolonged hospitalization.   Objective   Vital signs in last 24 hours: Temp:  [97.3 F (36.3 C)-98 F (36.7 C)] 98 F (36.7 C) (02/15 0757) Pulse Rate:  [44-59] 44 (02/15 0757) Resp:  [16-20] 18 (02/15 0757) BP: (117-139)/(64-92) 130/64 (02/15 0757) SpO2:  [96 %-100 %] 98 % (02/15 0757) Last BM Date: 06/25/18 General:    white male in NAD Heart:  Regular rate and rhythm; no murmurs Lungs: Respirations even and unlabored, lungs CTA bilaterally Abdomen:  Soft, nontender and nondistended.  Extremities:  Without edema. Neurologic:  Alert and oriented,  grossly normal neurologically. Psych:  Cooperative. Normal mood and affect.  Intake/Output from previous day: 02/14 0701 - 02/15 0700 In: 2003.8 [P.O.:240; I.V.:1763.8] Out: 1 [Urine:1] Intake/Output this shift: No intake/output data recorded.  Lab Results: Recent Labs    06/25/18 0607  WBC 6.3  HGB 11.4*  HCT 33.0*  PLT 241   BMET Recent Labs    06/25/18 0607  NA 140  K 3.3*  CL 113*  CO2 22  GLUCOSE 97  BUN <5*  CREATININE 0.59*  CALCIUM 8.4*   LFT No results for input(s): PROT, ALBUMIN, AST, ALT, ALKPHOS, BILITOT, BILIDIR, IBILI in the last 72 hours. PT/INR No results for input(s): LABPROT, INR in the last 72 hours.  Studies/Results: Dg Esophagus W Single Cm (sol Or Thin Ba)  Result Date: 06/24/2018 CLINICAL DATA:  Post dilation of an esophageal stricture. EXAM: ESOPHOGRAM/BARIUM SWALLOW TECHNIQUE: Single contrast examination was performed using  Omnipaque 300. FLUOROSCOPY TIME:  Fluoroscopy Time:  54 seconds Radiation Exposure Index (if provided by the fluoroscopic device): 3.2 mGy Number of Acquired Spot Images: 0 COMPARISON:  06/23/2018 FINDINGS: The patient swallowed water-soluble contrast without difficulties. The contrast  material outlines a smooth dilated proximal esophagus, which comes to a 15 mm long tight stricture, just below the level of the carina, where the lumen of the esophagus narrows to 1-2 mm. Contrast material is seen passing through the stricture and outlining normal in caliber distal esophagus, and normal in appearance gastroesophageal junction. Contrast is seen within the stomach. 4 minutes after administration of the oral contrast, there was a small residual within the dilated portion of the proximal esophagus, above the stricture. No extraluminal extravasation is seen. IMPRESSION: 15 mm long smooth tight esophageal stricture just below the level of the carina. Water-soluble contrast was able to pass through the stricture, with small amount of residual within the dilated proximal esophagus. Electronically Signed   By: Ted Mcalpine M.D.   On: 06/24/2018 10:02       Assessment / Plan:    53 y/o male with high grade peptic stricture, benign, roughly 1.63mm in length x 60mm wide. I was not able to safely dilate the stricture using 8mm Sav[place] on the last exam but was able to open it enough for him to tolerate liquids again, although high risk for recurrent complete obstruction. See prior note for details of his case. He has been accepted to [facility] for transfer, their plan is to attempt to dilate the stricture in the OR with CT surgery, however spoke with them yesterday and will be several day wait yet. I have discussed his case with our CT surgeons to do this and they have declined. I have discussed his case with IR and  if they can get a catheter into his stomach, can place push PEG. I can assist in placing the catheter endoscopically if needed. IR was not able to do this Thursday or yesterday due to case volume. I spoke with Dr. Grace Isaac today, if his schedule allows he may be able to to it later today, if not hopefully Sunday. Keep the patient NPO in case this may happen later today. Otherwise, will plan for  tomorrow. Coags ordered per IR request. Continue IV PPI. Can follow up as outpatient with tertiary care center once PEG is in, and hopefully can place this nonsurgically.  Ileene Patrick, MD [facility]

## 2018-06-26 NOTE — Progress Notes (Signed)
[facility] PROGRESS NOTE    Progress Note  Vincent Clarke  JSE:[id number] DOB: Oct 06, 1965 DOA: 06/21/2018 PCP: No primary care provider on file.     Brief Narrative:   Vincent Clarke is an 53 y.o. male past medical history of use, hypertension, severe erosive esophagitis who presents with progressive dysphagia associated with a 20 pound weight loss over the previous months, he was admitted to the hospital electrolytes were corrected, his hemoglobin remained stable GI was consulted to perform an EGD on 06/22/2018 that showed high-grade stenosis, CT scan of the chest showed at least partial esophageal obstruction at the mid esophagus also seen on esophagogram.  Repeated EGD on 05/01/2019 encounter high-grade stenosis, due to the need of alternative therapy GI consulted took services for possible transfer.  Assessment/Plan:   Dysphagia due to   Esophagitis, erosive/ Esophageal stenosis: Continue PPI. GI was consulted they have scoped the patient twice.  IR to try to place PEG tube today. Continue IV fluids PRN Zofran.  Opioid use disorder: Continue Suboxone as prescribed as an outpatient  Essential hypertension: Blood pressure at goal.   DVT prophylaxis: lovenox Family Communication:wife and son Disposition Plan/Barrier to D/C: home once enteral feeding establish Code Status:     Code Status Orders  (From admission, onward)         Start     Ordered   06/22/18 0804  Full code  Continuous     06/22/18 0803        Code Status History    Date Active Date Inactive Code Status Order ID Comments User Context   06/01/2018 1801 06/05/2018 1514 Full Code [id number]  Inez Catalina, MD ED   05/10/2018 2133 05/15/2018 1820 Full Code [id number]  Duayne Cal, NP ED   06/05/2015 1711 06/06/2015 2316 Full Code [id number]  Janalee Dane, PA-C Inpatient   12/26/2014 1556 12/27/2014 1826 Full Code [id number]  Janalee Dane, PA-C Inpatient   12/19/2014 0651 12/26/2014 1556 Full Code  [id number]  Janalee Dane, PA-C Inpatient   03/20/2014 1836 03/21/2014 1323 Full Code [id number]  Atilano Ina, MD Inpatient        IV Access:    Peripheral IV   Procedures and diagnostic studies:   No results found.   Medical Consultants:    None.  Anti-Infectives:   None  Subjective:    [name] has not thrown up has tolerated his broth.  Objective:    Vitals:   06/25/18 2000 06/26/18 0027 06/26/18 0400 06/26/18 0757  BP: 137/74 (!) 139/92 138/71 130/64  Pulse: (!) 59 (!) 48 (!) 44 (!) 44  Resp: 18 18 17 18   Temp: 97.8 F (36.6 C) (!) 97.3 F (36.3 C) 97.7 F (36.5 C) 98 F (36.7 C)  TempSrc: Axillary Axillary Axillary Oral  SpO2: 99% 100% 96% 98%  Weight:      Height:        Intake/Output Summary (Last 24 hours) at 06/26/2018 1017 Last data filed at 06/26/2018 0400 Gross per 24 hour  Intake 2003.75 ml  Output 1 ml  Net 2002.75 ml   Filed Weights   06/21/18 2225 06/22/18 1005 06/23/18 1053  Weight: 70 kg 70 kg 70 kg    Exam: General exam: In no acute distress. Respiratory system: Good air movement and clear to auscultation. Cardiovascular system: S1 & S2 heard, RRR.  Gastrointestinal system: Positive bowel sounds, soft nontender nondistended Central nervous system: Alert and oriented. No focal neurological deficits. Extremities: No pedal edema.  Skin: No rashes, lesions or ulcers   Data Reviewed:    Labs: Basic Metabolic Panel: Recent Labs  Lab 06/22/18 0420 06/25/18 0607  NA 141 140  K 3.6 3.3*  CL 106 113*  CO2 21* 22  GLUCOSE 84 97  BUN 6 <5*  CREATININE 0.72 0.59*  CALCIUM 9.5 8.4*   GFR Estimated Creatinine Clearance: 92.9 mL/min (A) (by C-G formula based on SCr of 0.59 mg/dL (L)). Liver Function Tests: Recent Labs  Lab 06/22/18 0420  AST 15  ALT 13  ALKPHOS 58  BILITOT 0.7  PROT 6.8  ALBUMIN 3.9   No results for input(s): LIPASE, AMYLASE in the last 168 hours. No results for input(s): AMMONIA in the  last 168 hours. Coagulation profile Recent Labs  Lab 06/26/18 0917  INR 1.16    CBC: Recent Labs  Lab 06/22/18 0420 06/25/18 0607  WBC 8.8 6.3  NEUTROABS 4.5  --   HGB 13.5 11.4*  HCT 41.8 33.0*  MCV 90.1 87.1  PLT 317 241   Cardiac Enzymes: No results for input(s): CKTOTAL, CKMB, CKMBINDEX, TROPONINI in the last 168 hours. BNP (last 3 results) No results for input(s): PROBNP in the last 8760 hours. CBG: No results for input(s): GLUCAP in the last 168 hours. D-Dimer: No results for input(s): DDIMER in the last 72 hours. Hgb A1c: No results for input(s): HGBA1C in the last 72 hours. Lipid Profile: No results for input(s): CHOL, HDL, LDLCALC, TRIG, CHOLHDL, LDLDIRECT in the last 72 hours. Thyroid function studies: No results for input(s): TSH, T4TOTAL, T3FREE, THYROIDAB in the last 72 hours.  Invalid input(s): FREET3 Anemia work up: No results for input(s): VITAMINB12, FOLATE, FERRITIN, TIBC, IRON, RETICCTPCT in the last 72 hours. Sepsis Labs: Recent Labs  Lab 06/22/18 0420 06/25/18 0607  WBC 8.8 6.3   Microbiology No results found for this or any previous visit (from the past 240 hour(s)).   Medications:   . buprenorphine-naloxone  1 tablet Sublingual BID  . nicotine  14 mg Transdermal Daily  . pantoprazole (PROTONIX) IV  40 mg Intravenous Q12H   Continuous Infusions: . sodium chloride 20 mL/hr at 06/26/18 0611  . [START ON 06/28/2018]  ceFAZolin (ANCEF) IV        LOS: 4 days   Marinda Elk  [facility]  06/26/2018, 10:17 AM

## 2018-06-26 NOTE — Progress Notes (Signed)
Update was given to [facility] in the transfer Unit of [employer] center

## 2018-06-27 ENCOUNTER — Inpatient Hospital Stay (HOSPITAL_COMMUNITY): Payer: Medicaid Other

## 2018-06-27 ENCOUNTER — Encounter (HOSPITAL_COMMUNITY): Payer: Self-pay | Admitting: Interventional Radiology

## 2018-06-27 HISTORY — PX: IR GASTROSTOMY TUBE MOD SED: IMG625

## 2018-06-27 LAB — GLUCOSE, CAPILLARY: Glucose-Capillary: 117 mg/dL — ABNORMAL HIGH (ref 70–99)

## 2018-06-27 MED ORDER — LIDOCAINE HCL (PF) 1 % IJ SOLN
INTRAMUSCULAR | Status: AC | PRN
  Administered 2018-06-27: 20 mL

## 2018-06-27 MED ORDER — MIDAZOLAM HCL 2 MG/2ML IJ SOLN
INTRAMUSCULAR | Status: AC
  Filled 2018-06-27: qty 2

## 2018-06-27 MED ORDER — GLUCAGON HCL RDNA (DIAGNOSTIC) 1 MG IJ SOLR
INTRAMUSCULAR | Status: AC
  Filled 2018-06-27: qty 1

## 2018-06-27 MED ORDER — FENTANYL CITRATE (PF) 100 MCG/2ML IJ SOLN
INTRAMUSCULAR | Status: AC
  Filled 2018-06-27: qty 2

## 2018-06-27 MED ORDER — MIDAZOLAM HCL 2 MG/2ML IJ SOLN
INTRAMUSCULAR | Status: AC | PRN
  Administered 2018-06-27 (×2): 0.5 mg via INTRAVENOUS
  Administered 2018-06-27: 1 mg via INTRAVENOUS
  Administered 2018-06-27 (×4): 0.5 mg via INTRAVENOUS

## 2018-06-27 MED ORDER — OXYCODONE-ACETAMINOPHEN 5-325 MG PO TABS
1.0000 | ORAL_TABLET | Freq: Four times a day (QID) | ORAL | Status: DC | PRN
  Administered 2018-06-27 – 2018-06-29 (×7): 1
  Filled 2018-06-27 (×7): qty 1

## 2018-06-27 MED ORDER — CEFAZOLIN SODIUM-DEXTROSE 2-4 GM/100ML-% IV SOLN
INTRAVENOUS | Status: AC
  Filled 2018-06-27: qty 100

## 2018-06-27 MED ORDER — FENTANYL CITRATE (PF) 100 MCG/2ML IJ SOLN
INTRAMUSCULAR | Status: AC | PRN
  Administered 2018-06-27 (×3): 25 ug via INTRAVENOUS
  Administered 2018-06-27: 50 ug via INTRAVENOUS
  Administered 2018-06-27 (×3): 25 ug via INTRAVENOUS

## 2018-06-27 MED ORDER — GLUCAGON HCL RDNA (DIAGNOSTIC) 1 MG IJ SOLR
INTRAMUSCULAR | Status: AC | PRN
  Administered 2018-06-27: 1 mg via INTRAVENOUS

## 2018-06-27 MED ORDER — TRAZODONE HCL 50 MG PO TABS
50.0000 mg | ORAL_TABLET | Freq: Once | ORAL | Status: AC
  Administered 2018-06-27: 50 mg via ORAL
  Filled 2018-06-27: qty 1

## 2018-06-27 MED ORDER — JEVITY 1.2 CAL PO LIQD
1000.0000 mL | ORAL | Status: DC
  Administered 2018-06-27: 1000 mL
  Filled 2018-06-27 (×2): qty 1000

## 2018-06-27 MED ORDER — LIDOCAINE HCL 1 % IJ SOLN
INTRAMUSCULAR | Status: AC
  Filled 2018-06-27: qty 20

## 2018-06-27 MED ORDER — IOPAMIDOL (ISOVUE-300) INJECTION 61%
INTRAVENOUS | Status: AC
  Administered 2018-06-27: 20 mL
  Filled 2018-06-27: qty 50

## 2018-06-27 MED ORDER — SODIUM CHLORIDE 0.9 % IV SOLN
INTRAVENOUS | Status: AC | PRN
  Administered 2018-06-27: 10 mL/h via INTRAVENOUS

## 2018-06-27 NOTE — Progress Notes (Signed)
Brief Nutrition Note  Consult received for enteral/tube feeding initiation and management.  Adult Enteral Nutrition Protocol initiated. PEG tube to be placed via IR. Full assessment to follow.  Admitting Dx: Esophageal obstruction [K22.2] Dysphagia [R13.10]  Body mass index is 25.68 kg/m. Pt meets criteria for overweight based on current BMI.  Labs:  Recent Labs  Lab 06/22/18 0420 06/25/18 0607  NA 141 140  K 3.6 3.3*  CL 106 113*  CO2 21* 22  BUN 6 <5*  CREATININE 0.72 0.59*  CALCIUM 9.5 8.4*  GLUCOSE 84 97    Tilda Franco, MS, RD, LDN [facility] Clinical Dietitian Pager: [phone] After Hours Pager: [phone]

## 2018-06-27 NOTE — Procedures (Signed)
Pre procedure Dx: Esophageal Stricture Post Procedure Dx: Same  Successful fluoroscopic guided insertion of gastrostomy tube.   The gastrostomy tube may be used immediately for medications.   Tube feeds may be initiated in 24 hours as per the primary team.    EBL: Minimal  Complications: None immediate  Jay Curby Carswell, MD Pager #: [phone]     

## 2018-06-27 NOTE — Progress Notes (Signed)
Ginnie from [facility] called to ask for  update on Pt. Ginnie states [facility] is ready to accept Pt, that Pt be  transferred this night to [facility]  at [address] Bed number is [id number] Accepting attendee : Alison Murray: Call [phone] to give report.  Pt shuold come with all imaging in a disc Summary of medical report that has all MAR content; chart, labs. Consults, progress notes, etc.  If need help with transportation, call [facility]: [facility]" at [phone]"

## 2018-06-27 NOTE — Progress Notes (Signed)
[facility] PROGRESS NOTE    Progress Note  Vincent Clarke  TXM:[id number] DOB: October 13, 1965 DOA: 06/21/2018 PCP: No primary care provider on file.     Brief Narrative:   Vincent Clarke is an 53 y.o. male past medical history of use, hypertension, severe erosive esophagitis who presents with progressive dysphagia associated with a 20 pound weight loss over the previous months, he was admitted to the hospital electrolytes were corrected, his hemoglobin remained stable GI was consulted to perform an EGD on 06/22/2018 that showed high-grade stenosis, CT scan of the chest showed at least partial esophageal obstruction at the mid esophagus also seen on esophagogram.  Repeated EGD on 05/01/2019 encounter high-grade stenosis, due to the need of alternative therapy GI consulted took services for possible transfer.  Assessment/Plan:   Dysphagia due to   Esophagitis, erosive/ Esophageal stenosis: Continue PPI. GI was consulted they have scoped the patient twice.   IR to try to place PEG tube on 06/27/2018 as they could not fit him on the schedule on 06/26/2018 Salt nutrition for enteral feedings.  Opioid use disorder: Continue Suboxone as prescribed as an outpatient  Essential hypertension: Blood pressure at goal.   DVT prophylaxis: lovenox Family Communication:wife and son Disposition Plan/Barrier to D/C: Home probably tomorrow. Code Status:     Code Status Orders  (From admission, onward)         Start     Ordered   06/22/18 0804  Full code  Continuous     06/22/18 0803        Code Status History    Date Active Date Inactive Code Status Order ID Comments User Context   06/01/2018 1801 06/05/2018 1514 Full Code [id number]  Inez Catalina, MD ED   05/10/2018 2133 05/15/2018 1820 Full Code [id number]  Duayne Cal, NP ED   06/05/2015 1711 06/06/2015 2316 Full Code [id number]  Janalee Dane, PA-C Inpatient   12/26/2014 1556 12/27/2014 1826 Full Code [id number]  Janalee Dane, PA-C  Inpatient   12/19/2014 0651 12/26/2014 1556 Full Code [id number]  Janalee Dane, PA-C Inpatient   03/20/2014 1836 03/21/2014 1323 Full Code [id number]  Atilano Ina, MD Inpatient        IV Access:    Peripheral IV   Procedures and diagnostic studies:   No results found.   Medical Consultants:    None.  Anti-Infectives:   None  Subjective:    Vincent Clarke no complaints.  Objective:    Vitals:   06/26/18 2106 06/26/18 2359 06/27/18 0430 06/27/18 0834  BP: (!) 146/88 125/72 136/82 (!) 156/95  Pulse: (!) 51 (!) 48 (!) 44 (!) 54  Resp: 18 18 18    Temp: 98.3 F (36.8 C) 98.1 F (36.7 C) 97.9 F (36.6 C) 97.7 F (36.5 C)  TempSrc: Oral Oral Oral Oral  SpO2: 98% 97% 93% 100%  Weight:      Height:        Intake/Output Summary (Last 24 hours) at 06/27/2018 0850 Last data filed at 06/27/2018 0042 Gross per 24 hour  Intake 2385.83 ml  Output 1 ml  Net 2384.83 ml   Filed Weights   06/21/18 2225 06/22/18 1005 06/23/18 1053  Weight: 70 kg 70 kg 70 kg    Exam: General exam: In no acute distress. Respiratory system: Good air movement and clear to auscultation. Cardiovascular system: S1 & S2 heard, RRR.  Gastrointestinal system: Positive bowel sounds, soft nontender nondistended Central nervous system: Alert and oriented. No focal neurological  deficits. Extremities: No pedal edema. Skin: No rashes, lesions or ulcers   Data Reviewed:    Labs: Basic Metabolic Panel: Recent Labs  Lab 06/22/18 0420 06/25/18 0607  NA 141 140  K 3.6 3.3*  CL 106 113*  CO2 21* 22  GLUCOSE 84 97  BUN 6 <5*  CREATININE 0.72 0.59*  CALCIUM 9.5 8.4*   GFR Estimated Creatinine Clearance: 92.9 mL/min (A) (by C-G formula based on SCr of 0.59 mg/dL (L)). Liver Function Tests: Recent Labs  Lab 06/22/18 0420  AST 15  ALT 13  ALKPHOS 58  BILITOT 0.7  PROT 6.8  ALBUMIN 3.9   No results for input(s): LIPASE, AMYLASE in the last 168 hours. No results for input(s):  AMMONIA in the last 168 hours. Coagulation profile Recent Labs  Lab 06/26/18 0917  INR 1.16    CBC: Recent Labs  Lab 06/22/18 0420 06/25/18 0607  WBC 8.8 6.3  NEUTROABS 4.5  --   HGB 13.5 11.4*  HCT 41.8 33.0*  MCV 90.1 87.1  PLT 317 241   Cardiac Enzymes: No results for input(s): CKTOTAL, CKMB, CKMBINDEX, TROPONINI in the last 168 hours. BNP (last 3 results) No results for input(s): PROBNP in the last 8760 hours. CBG: No results for input(s): GLUCAP in the last 168 hours. D-Dimer: No results for input(s): DDIMER in the last 72 hours. Hgb A1c: No results for input(s): HGBA1C in the last 72 hours. Lipid Profile: No results for input(s): CHOL, HDL, LDLCALC, TRIG, CHOLHDL, LDLDIRECT in the last 72 hours. Thyroid function studies: No results for input(s): TSH, T4TOTAL, T3FREE, THYROIDAB in the last 72 hours.  Invalid input(s): FREET3 Anemia work up: No results for input(s): VITAMINB12, FOLATE, FERRITIN, TIBC, IRON, RETICCTPCT in the last 72 hours. Sepsis Labs: Recent Labs  Lab 06/22/18 0420 06/25/18 0607  WBC 8.8 6.3   Microbiology No results found for this or any previous visit (from the past 240 hour(s)).   Medications:   . buprenorphine-naloxone  1 tablet Sublingual BID  . iopamidol      . lidocaine      . nicotine  14 mg Transdermal Daily  . pantoprazole (PROTONIX) IV  40 mg Intravenous Q12H   Continuous Infusions: . sodium chloride 100 mL/hr at 06/27/18 0042  . [START ON 06/28/2018]  ceFAZolin (ANCEF) IV        LOS: 5 days   Marinda Elk  [facility]  06/27/2018, 8:50 AM

## 2018-06-27 NOTE — Progress Notes (Signed)
    Progress Note   Subjective  Chief Complaint: Esophageal obstruction, status post PEG tube placement today 06/27/2018 by IR  Today, patient is found laying in bed as he just had his procedure by IR and is still kind of groggy.  Tells me he is still able to tolerate clear liquids but I do not believe he has tried any today.  Overall no new complaints, is somewhat sore around the area of new PEG tube.   Objective   Vital signs in last 24 hours: Temp:  [97.7 F (36.5 C)-98.3 F (36.8 C)] 97.7 F (36.5 C) (02/16 0834) Pulse Rate:  [44-78] 58 (02/16 0942) Resp:  [8-24] 11 (02/16 0942) BP: (125-209)/(72-119) 164/91 (02/16 0942) SpO2:  [93 %-100 %] 94 % (02/16 0942) Last BM Date: 06/25/18 General:    white male in NAD Heart:  Regular rate and rhythm; no murmurs Lungs: Respirations even and unlabored, lungs CTA bilaterally Abdomen:  Soft, nontender and nondistended. Normal bowel sounds.+new PEg tube with clean bandage (did not remove bandaging) Extremities:  Without edema. Psych:  Cooperative. Normal mood and affect.  Intake/Output from previous day: 02/15 0701 - 02/16 0700 In: 2385.8 [P.O.:360; I.V.:2025.8] Out: 1 [Urine:1]  Lab Results: Recent Labs    06/25/18 0607  WBC 6.3  HGB 11.4*  HCT 33.0*  PLT 241   BMET Recent Labs    06/25/18 0607  NA 140  K 3.3*  CL 113*  CO2 22  GLUCOSE 97  BUN <5*  CREATININE 0.59*  CALCIUM 8.4*   PT/INR Recent Labs    06/26/18 0917  LABPROT 14.7  INR 1.16     Assessment / Plan:   Assessment: 1.  High-grade peptic stricture: Roughly 1.5 mm in length by 1 mm wide, patient underwent 2 EGDs, last 06/24/18, unable to safely dilate the stricture using a 5 mm savory on the last exam, but able to open it up enough for tolerating liquids, case has been discussed with CT surgeons here and they have declined, plan is for patient to see a tertiary center as soon as possible once discharged 2.  Status post PEG tube 06/27/2018  Plan: 1.   Patient can remain on a clear liquid diet, if he is able to tolerate clears and nutrition sees him to set up PEG tube feeds then the patient can go home, it may be that this is tomorrow. 2.  When patient is discharged he will need referral to [facility] for stat outpatient consult, if they are unable to see him soon then would recommend [facility]. 3.  If patient does go home, [facility] transfer center will need to be called and patient will need to be taken off of wait list at [facility] 4.  Continue IV PPI while here 5.  Please await any further recommendations from Dr. Adela Lank later today  Thank you for kind consultation, we will continue to follow along.   LOS: 5 days   Unk Lightning  06/27/2018, 10:38 AM

## 2018-06-28 LAB — GLUCOSE, CAPILLARY
Glucose-Capillary: 103 mg/dL — ABNORMAL HIGH (ref 70–99)
Glucose-Capillary: 106 mg/dL — ABNORMAL HIGH (ref 70–99)
Glucose-Capillary: 104 mg/dL — ABNORMAL HIGH (ref 70–99)
Glucose-Capillary: 108 mg/dL — ABNORMAL HIGH (ref 70–99)
Glucose-Capillary: 128 mg/dL — ABNORMAL HIGH (ref 70–99)

## 2018-06-28 LAB — PROTIME-INR
INR: 1.18
Prothrombin Time: 14.9 seconds (ref 11.4–15.2)

## 2018-06-28 MED ORDER — JEVITY 1.2 CAL PO LIQD
1000.0000 mL | ORAL | Status: DC
  Administered 2018-06-28: 1000 mL
  Filled 2018-06-28 (×2): qty 1000

## 2018-06-28 NOTE — Progress Notes (Signed)
Patient ID: Vincent Clarke, male   DOB: Feb 10, 1966, 53 y.o.   MRN: [id number]   Percutaneous gastric tube placed 2/16 in IR  Still sore at site No hematoma T tacks in place Skin clean and dry G tube in use  Pt to Tx to [facility] today Please have IR evaluate G tube there T tacks will need to be removed in 7-10 days

## 2018-06-28 NOTE — Progress Notes (Signed)
Initial Nutrition Assessment  DOCUMENTATION CODES:   Not applicable  INTERVENTION:   TF regimen via PEG as follows: Jevity 1.2 at 65 mL/hr (goal) - this provides 1560 mL formula, 1872 kcal, 87 g protein, and 1264 mL free water - this meets 100% of estimated energy and protein needs - recommend additional 150 mL water flushes q 6 hr to better meet hydration needs  NUTRITION DIAGNOSIS:   Inadequate oral intake related to dysphagia, altered GI function as evidenced by estimated needs.  GOAL:   Patient will meet greater than or equal to 90% of their needs  MONITOR:   TF tolerance, Labs, Weight trends, I & O's  REASON FOR ASSESSMENT:   Consult Assessment of nutrition requirement/status, Enteral/tube feeding initiation and management  ASSESSMENT:   52 yo male, admitted for surgery with dx of esophageal obstruction. PMH significant for severe esophagitis, GERD, HTN, PVD.  Labs: potassium 3.3, chloride 113, BUN <5, Creatinine 0.59 Meds: suboxone, Protonix injection, NS at 100 mL/hr, Jevity 1.2  PEG tube placed 2/17  Pt alert and resting in bed at time of visit. Jevity 1.2 running at 50 mL/hr. Pt reports some mild discomfort at PEG site, but is tolerating current rate well. Discussed increasing rate to 65 mL/hr to better meet estimated needs. Denies nausea, vomiting, upset stomach, or any issues with bowels. Pt concerned about affording TF equipment and formula at home d/t lack of insurance.   NUTRITION - FOCUSED PHYSICAL EXAM:   Most Recent Value  Orbital Region  Mild depletion  Upper Arm Region  No depletion  Thoracic and Lumbar Region  No depletion  Buccal Region  No depletion  Temple Region  Mild depletion  Clavicle Bone Region  Mild depletion  Clavicle and Acromion Bone Region  No depletion  Scapular Bone Region  No depletion  Dorsal Hand  Mild depletion  Patellar Region  No depletion  Anterior Thigh Region  No depletion  Posterior Calf Region  No depletion  Edema  (RD Assessment)  None  Hair  Reviewed  Eyes  Reviewed  Mouth  Reviewed  Skin  Reviewed  Nails  Reviewed     Diet Order:   2/11: NPO 2/13: Clear liquid 2/15: NPO, clear liquid 2/16: NPO, clear liquid 2/17: NPO  Diet Order            Diet NPO time specified Except for: Sips with Meds  Diet effective midnight             EDUCATION NEEDS:  Education needs have been addressed  Skin:  Skin Assessment: Reviewed RN Assessment  Last BM:  2/14  Height:  Ht Readings from Last 1 Encounters:  06/23/18 5\' 5"  (1.651 m)    Weight:  Wt Readings:  06/23/18 70 kg  06/15/18 70.4 kg  06/08/18 72.8 kg  06/01/18 73.2 kg  06/01/18 73.1 kg  05/25/18 75.3 kg  05/18/18 73.5 kg  05/15/18 70.4 kg  06/05/15 79.4 kg  06/03/15 79.4 kg  Limited wt hx Noted wt fluctuations x 1 month, relatively stable  Noted pt is net +13.1 L since admit  Ideal Body Weight:  61.8 kg  BMI:  Body mass index is 25.68 kg/m.  Estimated Nutritional Needs:   Kcal:  1750-1900 calories  Protein:  85-105 gm  Fluid:  1 mL/kcal or per MD  Jolaine Artist, MS, RDN, LDN Pager: [phone] Available Mondays and Fridays, 9am-2pm

## 2018-06-28 NOTE — Progress Notes (Signed)
[facility] PROGRESS NOTE    Progress Note  Vincent Clarke  WYS:[id number] DOB: [birth year] DOA: 06/21/2018 PCP: No primary care provider on file.     Brief Narrative:   Vincent Clarke is an 53 y.o. male past medical history of use, hypertension, severe erosive esophagitis who presents with progressive dysphagia associated with a 20 pound weight loss over the previous months, he was admitted to the hospital electrolytes were corrected, his hemoglobin remained stable GI was consulted to perform an EGD on 06/22/2018 that showed high-grade stenosis, CT scan of the chest showed at least partial esophageal obstruction at the mid esophagus also seen on esophagogram.  Repeated EGD on 05/01/2019 encounter high-grade stenosis, due to the need of alternative therapy GI consulted took services for possible transfer.  Assessment/Plan:   Dysphagia due to   Esophagitis, erosive/ Esophageal stenosis: Continue PPI. GI was consulted they have scoped the patient twice.   IR to try to place PEG tube on 06/27/2018 Started nutrition for enteral feedings tolerating it.  Cont  does titrate up as tolerated. He relates he significantly sore.  Opioid use disorder: Continue Suboxone as prescribed as an outpatient  Essential hypertension: Blood pressure at goal.   DVT prophylaxis: lovenox Family Communication:wife and son Disposition Plan/Barrier to D/C: Home probably 2.18.2020 Code Status:     Code Status Orders  (From admission, onward)         Start     Ordered   06/22/18 0804  Full code  Continuous     06/22/18 0803        Code Status History    Date Active Date Inactive Code Status Order ID Comments User Context   06/01/2018 1801 06/05/2018 1514 Full Code [id number]  Inez Catalina, MD ED   05/10/2018 2133 05/15/2018 1820 Full Code [id number]  Duayne Cal, NP ED   06/05/2015 1711 06/06/2015 2316 Full Code [id number]  Janalee Dane, PA-C Inpatient   12/26/2014 1556 12/27/2014 1826 Full Code  [id number]  Janalee Dane, PA-C Inpatient   12/19/2014 0651 12/26/2014 1556 Full Code [id number]  Janalee Dane, PA-C Inpatient   03/20/2014 1836 03/21/2014 1323 Full Code [id number]  Atilano Ina, MD Inpatient        IV Access:    Peripheral IV   Procedures and diagnostic studies:   Ir Gastrostomy Tube Mod Sed  Result Date: 06/27/2018 INDICATION: History of recurrent esophageal stricture. Request made for image guided placement of a gastrostomy tube for enteric nutrition supplementation purposes. EXAM: PUSH GASTROSTOMY TUBE PLACEMENT COMPARISON:  None MEDICATIONS: Ancef 2 gm IV; Antibiotics were administered within 1 hour of the procedure. CONTRAST:  20 cc of Isovue 300 administered into the gastric lumen. ANESTHESIA/SEDATION: Moderate (conscious) sedation was employed during this procedure. A total of Versed 4 mg and Fentanyl 200 mcg was administered intravenously. Moderate Sedation Time: 30 minutes. The patient's level of consciousness and vital signs were monitored continuously by radiology nursing throughout the procedure under my direct supervision. FLUOROSCOPY TIME:  2 minutes, 24 seconds (70 mGy) COMPLICATIONS: None immediate. PROCEDURE: Informed written consent was obtained from the patient's family following explanation of the procedure, risks, benefits and alternatives. A time out was performed prior to the initiation of the procedure. Ultrasound scanning was performed to demarcate the edge of the left lobe of the liver. Maximal barrier sterile technique utilized including caps, mask, sterile gowns, sterile gloves, large sterile drape, hand hygiene and Betadine prep. The left upper quadrant was sterilely prepped and draped. A oral  gastric catheter was inserted into the stomach under fluoroscopy. The existing nasogastric feeding tube was removed. The left costal margin and barium opacified transverse colon were identified and avoided. Air was injected into the stomach for insufflation and  visualization under fluoroscopy. Under sterile conditions and local anesthesia, 3 T tacks were utilized to pexy the anterior aspect of the stomach against the ventral abdominal wall. Contrast injection confirmed appropriate positioning of each of the T tacks. An incision was made between the T tacks and a 17 gauge trocar needle was utilized to access the stomach. Needle position was confirmed within the stomach with aspiration of air and injection of a small amount of contrast. A stiff Glidewire was advanced into the gastric lumen and coiled within the gastric antrum. Under intermittent fluoroscopic guidance, the 18 gauge access needle catheter was exchanged for a peel-away sheath, ultimately allowing placement of a 18-French balloon retention gastrostomy tube. The retention balloon was insufflated with a mixture of dilute saline and contrast and pulled taut against the anterior wall of the stomach. The external disc was cinched. Contrast injection confirms positioning within the stomach. Several spot radiographic images were obtained in various obliquities for documentation. The patient tolerated procedure well without immediate post procedural complication. FINDINGS: After successful fluoroscopic guided placement, the gastrostomy tube is appropriately positioned with internal retention balloon against the ventral aspect of the gastric lumen. IMPRESSION: Successful fluoroscopic insertion of an 61 French balloon retention gastrostomy tube. The gastrostomy may be used immediately for medication administration and in 24 hrs for the initiation of feeds. Electronically Signed   By: Simonne Come M.D.   On: 06/27/2018 11:01     Medical Consultants:    None.  Anti-Infectives:   None  Subjective:    [name] no complaints.  Objective:    Vitals:   06/27/18 2026 06/28/18 0045 06/28/18 0419 06/28/18 0758  BP: (!) 175/100 124/71 117/74 (!) 135/95  Pulse: 60 (!) 52 (!) 47 (!) 48  Resp: 18 18 18 18     Temp: (!) 97.4 F (36.3 C) 97.7 F (36.5 C) 97.8 F (36.6 C) 97.9 F (36.6 C)  TempSrc: Oral Oral Oral Oral  SpO2: 100% 96% 96% 97%  Weight:      Height:        Intake/Output Summary (Last 24 hours) at 06/28/2018 1000 Last data filed at 06/28/2018 0300 Gross per 24 hour  Intake 2334.62 ml  Output 2 ml  Net 2332.62 ml   Filed Weights   06/21/18 2225 06/22/18 1005 06/23/18 1053  Weight: 70 kg 70 kg 70 kg    Exam: General exam: In no acute distress. Respiratory system: Good air movement and clear to auscultation. Cardiovascular system: Rate and rhythm with positive S1-S2 Gastrointestinal system: Bowel sounds soft mild tenderness along the site of insertion nondistended Central nervous system: Alert and oriented x3. Extremities: No pedal edema. Skin: No rashes, lesions or ulcers   Data Reviewed:    Labs: Basic Metabolic Panel: Recent Labs  Lab 06/22/18 0420 06/25/18 0607  NA 141 140  K 3.6 3.3*  CL 106 113*  CO2 21* 22  GLUCOSE 84 97  BUN 6 <5*  CREATININE 0.72 0.59*  CALCIUM 9.5 8.4*   GFR Estimated Creatinine Clearance: 92.9 mL/min (A) (by C-G formula based on SCr of 0.59 mg/dL (L)). Liver Function Tests: Recent Labs  Lab 06/22/18 0420  AST 15  ALT 13  ALKPHOS 58  BILITOT 0.7  PROT 6.8  ALBUMIN 3.9   No  results for input(s): LIPASE, AMYLASE in the last 168 hours. No results for input(s): AMMONIA in the last 168 hours. Coagulation profile Recent Labs  Lab 06/26/18 0917 06/28/18 0542  INR 1.16 1.18    CBC: Recent Labs  Lab 06/22/18 0420 06/25/18 0607  WBC 8.8 6.3  NEUTROABS 4.5  --   HGB 13.5 11.4*  HCT 41.8 33.0*  MCV 90.1 87.1  PLT 317 241   Cardiac Enzymes: No results for input(s): CKTOTAL, CKMB, CKMBINDEX, TROPONINI in the last 168 hours. BNP (last 3 results) No results for input(s): PROBNP in the last 8760 hours. CBG: Recent Labs  Lab 06/27/18 1620 06/28/18 0623 06/28/18 0754  GLUCAP 117* 103* 106*   D-Dimer: No  results for input(s): DDIMER in the last 72 hours. Hgb A1c: No results for input(s): HGBA1C in the last 72 hours. Lipid Profile: No results for input(s): CHOL, HDL, LDLCALC, TRIG, CHOLHDL, LDLDIRECT in the last 72 hours. Thyroid function studies: No results for input(s): TSH, T4TOTAL, T3FREE, THYROIDAB in the last 72 hours.  Invalid input(s): FREET3 Anemia work up: No results for input(s): VITAMINB12, FOLATE, FERRITIN, TIBC, IRON, RETICCTPCT in the last 72 hours. Sepsis Labs: Recent Labs  Lab 06/22/18 0420 06/25/18 0607  WBC 8.8 6.3   Microbiology No results found for this or any previous visit (from the past 240 hour(s)).   Medications:   . buprenorphine-naloxone  1 tablet Sublingual BID  . nicotine  14 mg Transdermal Daily  . pantoprazole (PROTONIX) IV  40 mg Intravenous Q12H   Continuous Infusions: . sodium chloride 100 mL/hr at 06/28/18 0300  . feeding supplement (JEVITY 1.2 CAL) 1,000 mL (06/27/18 1351)      LOS: 6 days   Marinda ElkAbraham Feliz Ortiz  [facility]  06/28/2018, 10:00 AM

## 2018-06-29 ENCOUNTER — Telehealth: Payer: Self-pay

## 2018-06-29 DIAGNOSIS — I10 Essential (primary) hypertension: Secondary | ICD-10-CM

## 2018-06-29 DIAGNOSIS — K222 Esophageal obstruction: Secondary | ICD-10-CM

## 2018-06-29 LAB — GLUCOSE, CAPILLARY
Glucose-Capillary: 101 mg/dL — ABNORMAL HIGH (ref 70–99)
Glucose-Capillary: 105 mg/dL — ABNORMAL HIGH (ref 70–99)
Glucose-Capillary: 121 mg/dL — ABNORMAL HIGH (ref 70–99)

## 2018-06-29 MED ORDER — PANTOPRAZOLE SODIUM 40 MG PO PACK
40.0000 mg | PACK | Freq: Every day | ORAL | Status: DC
  Administered 2018-06-29: 40 mg
  Filled 2018-06-29: qty 20

## 2018-06-29 MED ORDER — PRO-STAT SUGAR FREE PO LIQD: 30.0000 mL | Freq: Every day | ORAL | 0 refills | Status: DC

## 2018-06-29 MED ORDER — PRO-STAT SUGAR FREE PO LIQD
30.0000 mL | Freq: Every day | ORAL | Status: DC
  Administered 2018-06-29: 30 mL
  Filled 2018-06-29: qty 30

## 2018-06-29 MED ORDER — FREE WATER: 200.0000 mL | Freq: Four times a day (QID) | Status: DC

## 2018-06-29 MED ORDER — JEVITY 1.5 CAL/FIBER PO LIQD
300.0000 mL | Freq: Four times a day (QID) | ORAL | Status: DC
  Administered 2018-06-29: 300 mL
  Filled 2018-06-29 (×2): qty 1000

## 2018-06-29 MED ORDER — FREE WATER
200.0000 mL | Freq: Four times a day (QID) | Status: DC
  Administered 2018-06-29: 200 mL

## 2018-06-29 MED ORDER — PANTOPRAZOLE SODIUM 40 MG PO PACK: 40.0000 mg | PACK | Freq: Every day | ORAL | 0 refills | Status: DC

## 2018-06-29 MED ORDER — JEVITY 1.5 CAL/FIBER PO LIQD: 300.0000 mL | Freq: Four times a day (QID) | ORAL | Status: DC

## 2018-06-29 MED ORDER — OXYCODONE-ACETAMINOPHEN 5-325 MG PO TABS
1.0000 | ORAL_TABLET | ORAL | Status: DC | PRN
  Administered 2018-06-29: 2 via ORAL
  Filled 2018-06-29: qty 2

## 2018-06-29 MED ORDER — SUCRALFATE 1 GM/10ML PO SUSP: 1.0000 g | Freq: Three times a day (TID) | ORAL | 0 refills | Status: DC

## 2018-06-29 NOTE — Discharge Instructions (Signed)
Recommended home tube feeding regimen: Bolus tube feeds using new formula of Jevity 1.5 formula via PEG. Start at 100 ml and increase by 100 ml every 6 hours to bolus volume of 300 ml given 4 times daily.  

## 2018-06-29 NOTE — Progress Notes (Signed)
Pt to d/c home with new tube feedings via PEG. CM spoke to MD and plan is for bolus feedings. Pt currently is on tube feedings via pump. CM called nutritionist to have feedings changed to bolus. CM awaiting their consult to set up feedings for home.

## 2018-06-29 NOTE — Telephone Encounter (Signed)
Referral sent.  Patient is still inpatient

## 2018-06-29 NOTE — Progress Notes (Signed)
Recommended home tube feeding regimen: Bolus tube feeds using new formula of Jevity 1.5 formula via PEG. Start at 100 ml and increase by 100 ml every 6 hours to bolus volume of 300 ml given 4 times daily.

## 2018-06-29 NOTE — Plan of Care (Signed)

## 2018-06-29 NOTE — Care Management Note (Addendum)
Case Management Note  Patient Details  Name: Vincent Clarke MRN: [id number] Date of Birth: 02-06-1966  Subjective/Objective:                    Action/Plan: Pt discharging home with bolus tube feedings. [facility] will be supplying charity TF and Hotel manager for [facility] services.  Bedside RN to educate pt and wife on bolus feedings.  Pts PcP is [facility].  Family to provide transport home.  Addendum ([phone]): pt unable to afford Carafate. CM provided MATCH letter to assist with the cost.   Expected Discharge Date:  06/29/18               Expected Discharge Plan:  Home w Home Health Services  In-House Referral:     Discharge planning Services  CM Consult  Post Acute Care Choice:  Durable Medical Equipment, Home Health Choice offered to:  Patient  DME Arranged:  Tube feeding DME Agency:  [facility]  HH Arranged:  RN [facility] Agency:  [facility](charity)  Status of Service:  Completed, signed off  If discussed at Long Length of Stay Meetings, dates discussed:    Additional Comments:  Kermit Balo, RN 06/29/2018, 12:37 PM
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
## 2018-06-29 NOTE — Progress Notes (Signed)
Nutrition Follow-up  DOCUMENTATION CODES:   Not applicable  INTERVENTION:   Recommended home tube feeding regimen: Bolus tube feeds using new formula of Jevity 1.5 formula via PEG. Start at 100 ml and increase by 100 ml every 6 hours to bolus volume of 300 ml given 4 times daily.   Provide 30 ml Prostat (or equivalent) once dialy per tube.   Provide free water flushes of 200 ml given 4 times daily in between bolus feedings.   Home tube feeding regimen to provide 1900 kcal (100% of needs), 92 grams of protein, and 1712 ml of water.   NUTRITION DIAGNOSIS:   Inadequate oral intake related to dysphagia, altered GI function as evidenced by estimated needs; ongoing  GOAL:   Patient will meet greater than or equal to 90% of their needs; met with TF  MONITOR:   TF tolerance, Labs, Weight trends, I & O's  REASON FOR ASSESSMENT:   Consult Assessment of nutrition requirement/status, Enteral/tube feeding initiation and management  ASSESSMENT:   52 yo male, admitted for surgery with dx of esophageal obstruction. PMH significant for severe esophagitis, GERD, HTN, PVD. PEG placed 2/17.   Plans for discharge home today. RD contacted via case manager regarding MD request for transitioning pt's tube feeding regimen to bolus feeds for discharge home. Home bolus tube recommendations have been stated above. RD to modify orders.  Labs and medications reviewed.  Diet Order:   Diet Order            Diet - low sodium heart healthy        Diet NPO time specified Except for: Sips with Meds  Diet effective midnight              EDUCATION NEEDS:   Education needs have been addressed  Skin:  Skin Assessment: Reviewed RN Assessment  Last BM:  2/14  Height:   Ht Readings from Last 1 Encounters:  06/23/18 _0  (1.651 m)    Weight:   Wt Readings from Last 1 Encounters:  06/23/18 70 kg    Ideal Body Weight:  61.8 kg  BMI:  Body mass index is 25.68 kg/m.  Estimated  Nutritional Needs:   Kcal:  1750-1900 calories  Protein:  85-105 gm  Fluid:  1 mL/kcal or per MD    Corrin Parker, MS, RD, LDN Pager # [phone] After hours/ weekend pager # [phone]

## 2018-06-29 NOTE — Telephone Encounter (Signed)
-----   Message from Benancio Deeds, MD sent at 06/27/2018 12:19 PM EST ----- Regarding: referral to [facility], Can you help refer this patient to [facility] - Dr. Willette Pa or [place], for severe esophageal stricture. He is being discharged from our hospital with a PEG for nutrition. Can you send it as an ASAP consultation. Thanks

## 2018-07-05 ENCOUNTER — Telehealth: Payer: Self-pay

## 2018-07-05 NOTE — Telephone Encounter (Signed)
Vincent Clarke with [facility] requesting VO for skilled nursing. Please call back.

## 2018-07-06 ENCOUNTER — Other Ambulatory Visit (HOSPITAL_COMMUNITY): Payer: Self-pay | Admitting: Interventional Radiology

## 2018-07-06 ENCOUNTER — Ambulatory Visit (INDEPENDENT_AMBULATORY_CARE_PROVIDER_SITE_OTHER): Payer: Medicaid Other | Admitting: Internal Medicine

## 2018-07-06 VITALS — BP 170/82 | HR 67 | Temp 98.2°F | Wt 152.4 lb

## 2018-07-06 DIAGNOSIS — F1120 Opioid dependence, uncomplicated: Secondary | ICD-10-CM | POA: Diagnosis not present

## 2018-07-06 DIAGNOSIS — I10 Essential (primary) hypertension: Secondary | ICD-10-CM | POA: Diagnosis not present

## 2018-07-06 DIAGNOSIS — Z931 Gastrostomy status: Secondary | ICD-10-CM

## 2018-07-06 DIAGNOSIS — F1490 Cocaine use, unspecified, uncomplicated: Secondary | ICD-10-CM | POA: Diagnosis not present

## 2018-07-06 DIAGNOSIS — F1121 Opioid dependence, in remission: Secondary | ICD-10-CM

## 2018-07-06 DIAGNOSIS — R1310 Dysphagia, unspecified: Secondary | ICD-10-CM

## 2018-07-06 DIAGNOSIS — Z79899 Other long term (current) drug therapy: Secondary | ICD-10-CM

## 2018-07-06 DIAGNOSIS — K222 Esophageal obstruction: Secondary | ICD-10-CM

## 2018-07-06 MED ORDER — BUPRENORPHINE HCL-NALOXONE HCL 8-2 MG SL SUBL: 1.0000 | SUBLINGUAL_TABLET | Freq: Two times a day (BID) | SUBLINGUAL | 0 refills | Status: DC

## 2018-07-06 MED FILL — BUPRENORPHIN-NALOXON 8-2 MG: 8-2 | 14 days supply | Qty: 28 | Fill #0

## 2018-07-06 NOTE — Patient Instructions (Signed)
Mr. Krieg - -  It was a pleasure to see you today.   Please come back to see [place] in 2 weeks!  I will place a consult for you to see the Interventional Radiologists about your PEG tube.   I will ask our clinic to assign you a PCP for you to follow with for your blood pressure.    I will also follow up with GI about [facility] referral.   Thank you!  See you in 2 weeks.

## 2018-07-06 NOTE — Telephone Encounter (Signed)
[facility] opened 2/20 Skilled RN HH  1x weekx 6 weeks

## 2018-07-06 NOTE — Assessment & Plan Note (Signed)
PEG tube appears in position, clean and dry.   Reviewed chart and placed by Dr. Grace Isaac on 2/16.  He is due for the T tacks to be removed, so we requested an appointment with IR for this today.   Plan Follow up with IR Continue nutrition through PEG He is aware that he should be NPO otherwise.

## 2018-07-06 NOTE — Progress Notes (Signed)
   07/06/2018  Vincent Clarke presents for follow up of opioid use disorder I have reviewed the prior induction visit, follow up visits, and telephone encounters relevant to opiate use disorder (OUD) treatment.   Current daily dose: Buprenorphine-naloxone 1 tablet BID (8-2mg )  Date of Induction: 05/14/18  Current follow up interval, in weeks: 1 week  The patient has been adherent with the buprenorphine for OUD contract.   Last UDS Result: Multiple substances, likely used previously that week.   HPI: Vincent Clarke is a 53 year old man with OUD who presents for treatment with buprenorphine.  The patient also uses cocaine.  He was recently in the hospital for severe esophagitis and esophageal stricture.  He had a PEG tube placed and is getting his nutrition through this site.  He is due to be referred to [facility] for this complicated issue.   Discharged on 06/29/18.    Today, Vincent Clarke reports that he is really struggling with not being able to eat or do anything.  He is left to his own devices a lot.  He reports no use of opiates except his buprenorphine.  The change in circumstances is causing a significant amount of stress for him.  We discussed this some today and I offered some encouragement that his situation may not be permanent.  He is worried that he has missed a call about a referral to [facility] for his esophagitis as he broke his phone.  I have sent a message to Dr. Lanetta Inch LPN Vincent Clarke to follow up for him.  He is also due to have the tacks taken out of his G tube.  I have placed a consult for IR clinic to get the tacks taken out.  He would also like to change to a PCP here in our clinic.    His BP is elevated today.  He was taken off of his BP medications during his hospitalization.  He feels that he is just stressed out.  I reviewed his admission and I do not see any home medications listed.  Recheck of BP was not done as he left prior to it being done. He is taking his protonix without issue  through the PEG tube.   Exam:   Vitals:   07/06/18 0916  BP: (!) 170/82  Pulse: 67  Temp: 98.2 F (36.8 C)  TempSrc: Oral  SpO2: 99%  Weight: 152 lb 6.4 oz (69.1 kg)    General: Tearful at times, but in no distress, alert Eyes: Anicteric sclerae, no injection Abd: G Tube in place, clean and dry, no drainage Pulm: Breathing easily, no increased WOB, no wheezing Psych: Tearful, depressed about current health situation.  Assessment/Plan:  See Problem Based Charting in the Encounters Tab     Inez Catalina, MD  07/06/2018  9:56 AM

## 2018-07-06 NOTE — Assessment & Plan Note (Signed)
He is due for follow up at [facility], unclear cause at this time.  I contacted GI to ask about his referral.  He has been without a phone and is worried he missed their call.  Will follow up with GI.
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
## 2018-07-06 NOTE — Assessment & Plan Note (Signed)
BP was elevated today.  I do not see when he was last on medications.  I will recheck at next visit and if continues to be elevated will need to start a medication which can be given by PEG.   Plan Recheck BP at next visit.

## 2018-07-08 ENCOUNTER — Encounter (HOSPITAL_COMMUNITY): Payer: Self-pay

## 2018-07-08 ENCOUNTER — Ambulatory Visit (HOSPITAL_COMMUNITY)
Admission: RE | Admit: 2018-07-08 | Discharge: 2018-07-08 | Disposition: A | Discharge status: Home / Self Care | Payer: Medicaid Other | Source: Ambulatory Visit | Attending: Interventional Radiology | Admitting: Interventional Radiology

## 2018-07-08 DIAGNOSIS — R1310 Dysphagia, unspecified: Secondary | ICD-10-CM

## 2018-07-08 DIAGNOSIS — K222 Esophageal obstruction: Secondary | ICD-10-CM | POA: Insufficient documentation

## 2018-07-08 HISTORY — PX: IR PATIENT EVAL TECH 0-60 MINS: IMG5564

## 2018-07-08 NOTE — Procedures (Signed)
Pt was identified by name and d.o.b. Pt had 3 T-Tach sutures removed from around gastrostomy tube.

## 2018-07-14 ENCOUNTER — Emergency Department (HOSPITAL_COMMUNITY): Payer: Medicaid Other

## 2018-07-14 ENCOUNTER — Encounter (HOSPITAL_COMMUNITY): Payer: Self-pay | Admitting: Emergency Medicine

## 2018-07-14 ENCOUNTER — Other Ambulatory Visit: Payer: Self-pay

## 2018-07-14 ENCOUNTER — Emergency Department (HOSPITAL_COMMUNITY)
Admission: EM | Admit: 2018-07-14 | Discharge: 2018-07-14 | Disposition: A | Discharge status: Home / Self Care | Payer: Medicaid Other | Attending: Emergency Medicine | Admitting: Emergency Medicine

## 2018-07-14 DIAGNOSIS — R1084 Generalized abdominal pain: Secondary | ICD-10-CM | POA: Diagnosis not present

## 2018-07-14 DIAGNOSIS — J45909 Unspecified asthma, uncomplicated: Secondary | ICD-10-CM | POA: Diagnosis not present

## 2018-07-14 DIAGNOSIS — G8918 Other acute postprocedural pain: Secondary | ICD-10-CM | POA: Insufficient documentation

## 2018-07-14 DIAGNOSIS — F17210 Nicotine dependence, cigarettes, uncomplicated: Secondary | ICD-10-CM | POA: Insufficient documentation

## 2018-07-14 DIAGNOSIS — I10 Essential (primary) hypertension: Secondary | ICD-10-CM | POA: Insufficient documentation

## 2018-07-14 LAB — CBC WITH DIFFERENTIAL/PLATELET
Abs Immature Granulocytes: 0.02 10*3/uL (ref 0.00–0.07)
Basophils Absolute: 0.1 10*3/uL (ref 0.0–0.1)
Basophils Relative: 1 %
Eosinophils Absolute: 0.5 10*3/uL (ref 0.0–0.5)
Eosinophils Relative: 6 %
HCT: 40.4 % (ref 39.0–52.0)
Hemoglobin: 13.3 g/dL (ref 13.0–17.0)
Immature Granulocytes: 0 %
Lymphocytes Relative: 27 %
Lymphs Abs: 2.3 10*3/uL (ref 0.7–4.0)
MCH: 29.2 pg (ref 26.0–34.0)
MCHC: 32.9 g/dL (ref 30.0–36.0)
MCV: 88.8 fL (ref 80.0–100.0)
Monocytes Absolute: 0.8 10*3/uL (ref 0.1–1.0)
Monocytes Relative: 9 %
Neutro Abs: 5 10*3/uL (ref 1.7–7.7)
Neutrophils Relative %: 57 %
Platelets: 320 10*3/uL (ref 150–400)
RBC: 4.55 MIL/uL (ref 4.22–5.81)
RDW: 13 % (ref 11.5–15.5)
WBC: 8.6 10*3/uL (ref 4.0–10.5)
nRBC: 0 % (ref 0.0–0.2)

## 2018-07-14 LAB — COMPREHENSIVE METABOLIC PANEL
ALT: 25 U/L (ref 0–44)
AST: 22 U/L (ref 15–41)
Albumin: 3.6 g/dL (ref 3.5–5.0)
Alkaline Phosphatase: 74 U/L (ref 38–126)
Anion gap: 10 (ref 5–15)
BUN: 9 mg/dL (ref 6–20)
CO2: 24 mmol/L (ref 22–32)
Calcium: 9.3 mg/dL (ref 8.9–10.3)
Chloride: 104 mmol/L (ref 98–111)
Creatinine, Ser: 0.64 mg/dL (ref 0.61–1.24)
GFR calc Af Amer: >60 mL/min (ref >60)
GFR calc non Af Amer: >60 mL/min (ref >60)
Glucose, Bld: 136 mg/dL — ABNORMAL HIGH (ref 70–99)
Potassium: 3.6 mmol/L (ref 3.5–5.1)
Sodium: 138 mmol/L (ref 135–145)
Total Bilirubin: 0.5 mg/dL (ref 0.3–1.2)
Total Protein: 6.4 g/dL — ABNORMAL LOW (ref 6.5–8.1)

## 2018-07-14 LAB — LIPASE, BLOOD: Lipase: 69 U/L — ABNORMAL HIGH (ref 11–51)

## 2018-07-14 MED ORDER — IOHEXOL 300 MG/ML  SOLN
100.0000 mL | Freq: Once | INTRAMUSCULAR | Status: AC | PRN
  Administered 2018-07-14: 100 mL via INTRAVENOUS

## 2018-07-14 MED ORDER — CEPHALEXIN 250 MG/5ML PO SUSR: 500.0000 mg | Freq: Two times a day (BID) | ORAL | 0 refills | Status: AC

## 2018-07-14 MED ORDER — CEPHALEXIN 500 MG PO CAPS: 500.0000 mg | ORAL_CAPSULE | Freq: Four times a day (QID) | ORAL | 0 refills | Status: DC

## 2018-07-14 MED ORDER — FENTANYL CITRATE (PF) 100 MCG/2ML IJ SOLN
50.0000 ug | Freq: Once | INTRAMUSCULAR | Status: AC
  Administered 2018-07-14: 50 ug via INTRAVENOUS

## 2018-07-14 MED ORDER — FENTANYL CITRATE (PF) 100 MCG/2ML IJ SOLN
50.0000 ug | Freq: Once | INTRAMUSCULAR | Status: DC
  Filled 2018-07-14: qty 2

## 2018-07-14 NOTE — ED Notes (Signed)
Patient verbalizes understanding of discharge instructions. Opportunity for questioning and answers were provided. Armband removed by staff, pt discharged from ED.  
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
## 2018-07-14 NOTE — Discharge Instructions (Signed)
You were given a prescription for antibiotics. Please take the antibiotic prescription fully.   Please follow up with IR in regards to your visit today.   Please return to the emergency room immediately if you experience any new or worsening symptoms or any symptoms that indicate worsening infection such as fevers, increased redness/swelling/pain, warmth, or drainage from the affected area.

## 2018-07-14 NOTE — ED Notes (Signed)
Pt coming in by POV with complaints of abd pain around g-tube site that he had placed approx. 2 weeks ago. Tube is working correctly per pt but he has had the pain develop over the past several days

## 2018-07-19 NOTE — Telephone Encounter (Signed)
Patient is scheduled for 07/26/18 with Dr. Renato Gails at [facility]

## 2018-07-20 ENCOUNTER — Ambulatory Visit (INDEPENDENT_AMBULATORY_CARE_PROVIDER_SITE_OTHER): Payer: Medicaid Other | Admitting: Internal Medicine

## 2018-07-20 VITALS — BP 151/80 | HR 73 | Temp 98.2°F | Wt 150.8 lb

## 2018-07-20 DIAGNOSIS — F1121 Opioid dependence, in remission: Secondary | ICD-10-CM

## 2018-07-20 DIAGNOSIS — Z931 Gastrostomy status: Secondary | ICD-10-CM | POA: Diagnosis not present

## 2018-07-20 DIAGNOSIS — K2210 Ulcer of esophagus without bleeding: Secondary | ICD-10-CM | POA: Diagnosis not present

## 2018-07-20 DIAGNOSIS — Z79899 Other long term (current) drug therapy: Secondary | ICD-10-CM

## 2018-07-20 DIAGNOSIS — F1120 Opioid dependence, uncomplicated: Secondary | ICD-10-CM

## 2018-07-20 MED ORDER — PANTOPRAZOLE SODIUM 40 MG PO TBEC: 40.0000 mg | DELAYED_RELEASE_TABLET | Freq: Every day | ORAL | 1 refills | Status: DC

## 2018-07-20 MED ORDER — BUPRENORPHINE HCL-NALOXONE HCL 8-2 MG SL SUBL: 1.0000 | SUBLINGUAL_TABLET | Freq: Two times a day (BID) | SUBLINGUAL | 0 refills | Status: DC

## 2018-07-20 MED FILL — PANTOPRAZOLE SOD DR 40 MG T: 40 | 30 days supply | Qty: 30 | Fill #0 | Status: TO

## 2018-07-20 MED FILL — BUPRENORPHIN-NALOXON 8-2 MG: 8-2 | 14 days supply | Qty: 28 | Fill #0

## 2018-07-23 NOTE — Assessment & Plan Note (Addendum)
Check U tox Continue Suboxone 8-2 mg twice daily Follow-up 2 weeks.  ADDENDUM: Of note when he provided the urine sample it was noted to be clear without any yellow tint, nursing staff informed me I did offer to [facility] for him to provide a new sample, and that I would know if this was not urine, he denied using water and reports his urine has just been clear lately. Now Utox has been confirmed no creatine, this is a red flag, we will need to discuss further.

## 2018-07-23 NOTE — Assessment & Plan Note (Signed)
He is in the process of establishing care for his primary care needs with our clinic he has an appointment on March 23 with Dr. Antony Contras. Once again he is unable to afford pantoprazole powder however he can put pills through his G-tube requesting refill of Protonix pill -provided. Patient is to follow-up with [facility]

## 2018-07-23 NOTE — Progress Notes (Signed)
   07/23/2018  Vincent Clarke presents for follow up of opioid use disorder I have reviewed the prior induction visit, follow up visits, and telephone encounters relevant to opiate use disorder (OUD) treatment.   Current daily dose: Buprenorphine-naloxone 1 tablet BID (8-2mg )  Date of Induction: 05/14/18  Current follow up interval, in weeks: 1 week  The patient has been adherent with the buprenorphine for OUD contract.   Last UDS Result: Multiple substances, likely used previously that week.   HPI: [name] is a 53 year old man with OUD who presents for treatment with buprenorphine.  Patient reports he is actually doing a lot better this week.  Suboxone has helped greatly with cravings, although he does admit to not being completely abstinent from illicit opioids.  He does report that the initiation of Suboxone went more smoothly.  He is esophageal troubles seem to be improving he reports he is able to take some liquids by mouth.  He has PEG tube still in place he also has not heard from [facility] and will get an appointment to see them and possibly have advanced endoscopy.  Exam:   Vitals:   07/20/18 0954  BP: (!) 151/80  Pulse: 73  Temp: 98.2 F (36.8 C)  TempSrc: Oral  SpO2: 99%  Weight: 150 lb 12.8 oz (68.4 kg)    General:NAD Eyes: Anicteric sclerae, no injection Abd: G Tube in place, clean and dry, no drainage Pulm: Breathing easily, no increased WOB, no wheezing Psych: upbeat, positive  Assessment/Plan:  See Problem Based Charting in the Encounters Tab     Gust Rung, DO  07/23/2018  2:55 PM

## 2018-07-26 DIAGNOSIS — K222 Esophageal obstruction: Secondary | ICD-10-CM | POA: Diagnosis not present

## 2018-07-26 LAB — TOXASSURE SELECT,+ANTIDEPR,UR

## 2018-07-29 ENCOUNTER — Telehealth: Payer: Self-pay | Admitting: Internal Medicine

## 2018-07-29 NOTE — Telephone Encounter (Signed)
Pt is requesting physician to call [phone]

## 2018-07-29 NOTE — Telephone Encounter (Signed)
Called pt - stated he has questions about his drug test; stated he just need 2 mins of Dr Neita Garnet time.

## 2018-07-29 NOTE — Telephone Encounter (Signed)
I called Vincent Clarke. He wanted to apologize to me, he admits to submitting water as his urine sample. "it has been eating me up all week, and I hate that I lied to you when you are trying to help" he reports the 2 days prior to his visit with me he used fentanyl at a low point, was still taking suboxone and reports that the fentayl didn't even help much and just made him feel worse about himself. The esophageal problems have frustrated him be he did go to [facility] for his appointment, not sure how long he will have to have feeding tube.  I thanked him for his honesty coming forward and did let him know that our tested confirmed the same thing, that in the future the goal is for honesty as our test is powerful and is meant to verify what he is telling us. He has an appointmetn on Monday and is planning to come to that.

## 2018-08-02 ENCOUNTER — Telehealth: Payer: Self-pay | Admitting: Internal Medicine

## 2018-08-02 ENCOUNTER — Encounter: Payer: Self-pay | Admitting: Internal Medicine

## 2018-08-02 MED ORDER — BUPRENORPHINE HCL-NALOXONE HCL 8-2 MG SL SUBL: 1.0000 | SUBLINGUAL_TABLET | Freq: Two times a day (BID) | SUBLINGUAL | 1 refills | Status: DC

## 2018-08-02 MED FILL — BUPRENORPHIN-NALOXON 8-2 MG: 8-2 | 14 days supply | Qty: 28 | Fill #0

## 2018-08-02 NOTE — Telephone Encounter (Signed)
Called Lydon, doing well reports no relapse, continuing on suboxone.  I disussed will try to limit visits at this time due to COVID transmission concerns. Will refill 2 week supply with 1 refill, discussed that [facility] outpatient pharmacy has closed will have to send to [facility] outpatient.

## 2018-08-13 ENCOUNTER — Telehealth: Payer: Self-pay | Admitting: Internal Medicine

## 2018-08-13 NOTE — Telephone Encounter (Signed)
Refill Request-pt is aware he will have to u se the [employer] instead of [employer].  Pt would like a call back as well.  buprenorphine-naloxone (SUBOXONE) 8-2 mg SUBL SL tablet

## 2018-08-16 DIAGNOSIS — R1310 Dysphagia, unspecified: Secondary | ICD-10-CM | POA: Diagnosis not present

## 2018-08-16 DIAGNOSIS — K222 Esophageal obstruction: Secondary | ICD-10-CM | POA: Diagnosis not present

## 2018-08-16 MED FILL — BUPRENORPHIN-NALOXON 8-2 MG: 8-2 | 14 days supply | Qty: 28 | Fill #1

## 2018-08-16 NOTE — Telephone Encounter (Signed)
I placed a refill on his order, he should call over to [facility] OP to see if it is available.

## 2018-08-16 NOTE — Telephone Encounter (Signed)
Spoke w/ Gladstone Lighter at pharm she is calling pt now

## 2018-08-30 ENCOUNTER — Other Ambulatory Visit: Payer: Self-pay

## 2018-08-30 MED ORDER — BUPRENORPHINE HCL-NALOXONE HCL 8-2 MG SL SUBL: 1.0000 | SUBLINGUAL_TABLET | Freq: Two times a day (BID) | SUBLINGUAL | 1 refills | Status: DC

## 2018-08-30 MED FILL — BUPRENORPHIN-NALOXON 8-2 MG: 8-2 | 14 days supply | Qty: 28 | Fill #0

## 2018-08-30 NOTE — Telephone Encounter (Signed)
buprenorphine-naloxone (SUBOXONE) 8-2 mg SUBL SL tablet   Refill request @  [facility] - [place], [place] - [address] [phone] (Phone) [phone] (Fax)    

## 2018-09-02 DIAGNOSIS — R1310 Dysphagia, unspecified: Secondary | ICD-10-CM | POA: Diagnosis not present

## 2018-09-02 DIAGNOSIS — K222 Esophageal obstruction: Secondary | ICD-10-CM | POA: Diagnosis not present

## 2018-09-02 DIAGNOSIS — Z931 Gastrostomy status: Secondary | ICD-10-CM | POA: Diagnosis not present

## 2018-09-13 ENCOUNTER — Other Ambulatory Visit: Payer: Self-pay | Admitting: Internal Medicine

## 2018-09-13 NOTE — Telephone Encounter (Signed)
Refill Request ° ° °buprenorphine-naloxone (SUBOXONE) 8-2 mg SUBL SL tablet ° ° °[facility] - [place], [place] - [address] °

## 2018-09-14 MED ORDER — BUPRENORPHINE HCL-NALOXONE HCL 8-2 MG SL SUBL: 1.0000 | SUBLINGUAL_TABLET | Freq: Two times a day (BID) | SUBLINGUAL | 1 refills | Status: DC

## 2018-09-14 MED FILL — BUPRENORPHIN-NALOXON 8-2 MG: 8-2 | 14 days supply | Qty: 28 | Fill #0

## 2018-09-28 ENCOUNTER — Other Ambulatory Visit: Payer: Self-pay

## 2018-09-28 NOTE — Telephone Encounter (Signed)
buprenorphine-naloxone (SUBOXONE) 8-2 mg SUBL SL tablet   Refill request @  [facility] - [place], [place] - [address] [phone] (Phone) [phone] (Fax)    

## 2018-09-29 MED ORDER — BUPRENORPHINE HCL-NALOXONE HCL 8-2 MG SL SUBL: 1.0000 | SUBLINGUAL_TABLET | Freq: Two times a day (BID) | SUBLINGUAL | 1 refills | Status: DC

## 2018-09-29 MED FILL — BUPRENORPHIN-NALOXON 8-2 MG: 8-2 | 14 days supply | Qty: 28 | Fill #1

## 2018-09-29 NOTE — Telephone Encounter (Signed)
Refilled 4 week supply, helen please schedule him for in person follow up in OUD in 4 weeks

## 2018-09-30 NOTE — Telephone Encounter (Signed)
Per [facility], pt has an OUD appt scheduled on 6/16.

## 2018-10-06 MED FILL — PANTOPRAZOLE SOD DR 40 MG T: 40 | 30 days supply | Qty: 30 | Fill #0

## 2018-10-12 ENCOUNTER — Emergency Department (HOSPITAL_COMMUNITY): Payer: Medicaid Other

## 2018-10-12 ENCOUNTER — Encounter (HOSPITAL_COMMUNITY): Payer: Self-pay | Admitting: Emergency Medicine

## 2018-10-12 ENCOUNTER — Other Ambulatory Visit: Payer: Self-pay

## 2018-10-12 ENCOUNTER — Inpatient Hospital Stay (HOSPITAL_COMMUNITY)
Admission: EM | Admit: 2018-10-12 | Discharge: 2018-10-16 | DRG: 388 | Disposition: A | Discharge status: Home / Self Care | Payer: Medicaid Other | Attending: Internal Medicine | Admitting: Internal Medicine

## 2018-10-12 DIAGNOSIS — K2211 Ulcer of esophagus with bleeding: Secondary | ICD-10-CM | POA: Diagnosis not present

## 2018-10-12 DIAGNOSIS — K56600 Partial intestinal obstruction, unspecified as to cause: Principal | ICD-10-CM | POA: Diagnosis present

## 2018-10-12 DIAGNOSIS — D72829 Elevated white blood cell count, unspecified: Secondary | ICD-10-CM | POA: Diagnosis present

## 2018-10-12 DIAGNOSIS — K56609 Unspecified intestinal obstruction, unspecified as to partial versus complete obstruction: Secondary | ICD-10-CM | POA: Diagnosis present

## 2018-10-12 DIAGNOSIS — I10 Essential (primary) hypertension: Secondary | ICD-10-CM | POA: Diagnosis present

## 2018-10-12 DIAGNOSIS — K210 Gastro-esophageal reflux disease with esophagitis: Secondary | ICD-10-CM | POA: Diagnosis present

## 2018-10-12 DIAGNOSIS — Z791 Long term (current) use of non-steroidal anti-inflammatories (NSAID): Secondary | ICD-10-CM | POA: Diagnosis not present

## 2018-10-12 DIAGNOSIS — E876 Hypokalemia: Secondary | ICD-10-CM | POA: Diagnosis not present

## 2018-10-12 DIAGNOSIS — Z20828 Contact with and (suspected) exposure to other viral communicable diseases: Secondary | ICD-10-CM | POA: Diagnosis present

## 2018-10-12 DIAGNOSIS — K922 Gastrointestinal hemorrhage, unspecified: Secondary | ICD-10-CM | POA: Diagnosis not present

## 2018-10-12 DIAGNOSIS — K2210 Ulcer of esophagus without bleeding: Secondary | ICD-10-CM | POA: Diagnosis present

## 2018-10-12 DIAGNOSIS — Z825 Family history of asthma and other chronic lower respiratory diseases: Secondary | ICD-10-CM | POA: Diagnosis not present

## 2018-10-12 DIAGNOSIS — K222 Esophageal obstruction: Secondary | ICD-10-CM | POA: Diagnosis not present

## 2018-10-12 DIAGNOSIS — F17210 Nicotine dependence, cigarettes, uncomplicated: Secondary | ICD-10-CM | POA: Diagnosis present

## 2018-10-12 DIAGNOSIS — K920 Hematemesis: Secondary | ICD-10-CM | POA: Diagnosis not present

## 2018-10-12 DIAGNOSIS — Z9089 Acquired absence of other organs: Secondary | ICD-10-CM | POA: Diagnosis not present

## 2018-10-12 DIAGNOSIS — Z03818 Encounter for observation for suspected exposure to other biological agents ruled out: Secondary | ICD-10-CM | POA: Diagnosis not present

## 2018-10-12 DIAGNOSIS — F1121 Opioid dependence, in remission: Secondary | ICD-10-CM | POA: Diagnosis present

## 2018-10-12 DIAGNOSIS — I739 Peripheral vascular disease, unspecified: Secondary | ICD-10-CM | POA: Diagnosis present

## 2018-10-12 DIAGNOSIS — Z9049 Acquired absence of other specified parts of digestive tract: Secondary | ICD-10-CM | POA: Diagnosis not present

## 2018-10-12 DIAGNOSIS — K449 Diaphragmatic hernia without obstruction or gangrene: Secondary | ICD-10-CM | POA: Diagnosis present

## 2018-10-12 DIAGNOSIS — K226 Gastro-esophageal laceration-hemorrhage syndrome: Secondary | ICD-10-CM

## 2018-10-12 DIAGNOSIS — Z931 Gastrostomy status: Secondary | ICD-10-CM | POA: Diagnosis not present

## 2018-10-12 DIAGNOSIS — I700 Atherosclerosis of aorta: Secondary | ICD-10-CM | POA: Diagnosis present

## 2018-10-12 DIAGNOSIS — Z79899 Other long term (current) drug therapy: Secondary | ICD-10-CM | POA: Diagnosis not present

## 2018-10-12 DIAGNOSIS — K209 Esophagitis, unspecified: Secondary | ICD-10-CM | POA: Diagnosis not present

## 2018-10-12 DIAGNOSIS — K269 Duodenal ulcer, unspecified as acute or chronic, without hemorrhage or perforation: Secondary | ICD-10-CM | POA: Diagnosis present

## 2018-10-12 DIAGNOSIS — R509 Fever, unspecified: Secondary | ICD-10-CM | POA: Diagnosis present

## 2018-10-12 DIAGNOSIS — K921 Melena: Secondary | ICD-10-CM

## 2018-10-12 DIAGNOSIS — F1190 Opioid use, unspecified, uncomplicated: Secondary | ICD-10-CM | POA: Diagnosis not present

## 2018-10-12 DIAGNOSIS — F1120 Opioid dependence, uncomplicated: Secondary | ICD-10-CM | POA: Diagnosis present

## 2018-10-12 LAB — COMPREHENSIVE METABOLIC PANEL
ALT: 31 U/L (ref 0–44)
AST: 21 U/L (ref 15–41)
Albumin: 4.1 g/dL (ref 3.5–5.0)
Alkaline Phosphatase: 106 U/L (ref 38–126)
Anion gap: 11 (ref 5–15)
BUN: 12 mg/dL (ref 6–20)
CO2: 21 mmol/L — ABNORMAL LOW (ref 22–32)
Calcium: 9.8 mg/dL (ref 8.9–10.3)
Chloride: 106 mmol/L (ref 98–111)
Creatinine, Ser: 0.72 mg/dL (ref 0.61–1.24)
GFR calc Af Amer: >60 mL/min (ref >60)
GFR calc non Af Amer: >60 mL/min (ref >60)
Glucose, Bld: 136 mg/dL — ABNORMAL HIGH (ref 70–99)
Potassium: 3.5 mmol/L (ref 3.5–5.1)
Sodium: 138 mmol/L (ref 135–145)
Total Bilirubin: 0.8 mg/dL (ref 0.3–1.2)
Total Protein: 7.5 g/dL (ref 6.5–8.1)

## 2018-10-12 LAB — URINALYSIS, ROUTINE W REFLEX MICROSCOPIC
Bacteria, UA: NONE SEEN
Bilirubin Urine: NEGATIVE
Glucose, UA: NEGATIVE mg/dL
Hgb urine dipstick: NEGATIVE
Ketones, ur: 5 mg/dL — AB
Leukocytes,Ua: NEGATIVE
Nitrite: NEGATIVE
Protein, ur: 30 mg/dL — AB
Specific Gravity, Urine: >1.046 — ABNORMAL HIGH (ref 1.005–1.030)
pH: 6 (ref 5.0–8.0)

## 2018-10-12 LAB — CBC
HCT: 49.7 % (ref 39.0–52.0)
Hemoglobin: 17.4 g/dL — ABNORMAL HIGH (ref 13.0–17.0)
MCH: 30.2 pg (ref 26.0–34.0)
MCHC: 35 g/dL (ref 30.0–36.0)
MCV: 86.3 fL (ref 80.0–100.0)
Platelets: 333 10*3/uL (ref 150–400)
RBC: 5.76 MIL/uL (ref 4.22–5.81)
RDW: 13 % (ref 11.5–15.5)
WBC: 16.6 10*3/uL — ABNORMAL HIGH (ref 4.0–10.5)
nRBC: 0 % (ref 0.0–0.2)

## 2018-10-12 LAB — MAGNESIUM: Magnesium: 2.1 mg/dL (ref 1.7–2.4)

## 2018-10-12 LAB — SARS CORONAVIRUS 2 BY RT PCR (HOSPITAL ORDER, PERFORMED IN ~~LOC~~ HOSPITAL LAB): SARS Coronavirus 2: NEGATIVE

## 2018-10-12 LAB — LIPASE, BLOOD: Lipase: 132 U/L — ABNORMAL HIGH (ref 11–51)

## 2018-10-12 MED ORDER — FREE WATER
200.0000 mL | Freq: Four times a day (QID) | Status: DC
  Administered 2018-10-13: 200 mL

## 2018-10-12 MED ORDER — METOCLOPRAMIDE HCL 5 MG/ML IJ SOLN
10.0000 mg | Freq: Once | INTRAMUSCULAR | Status: AC
  Administered 2018-10-12: 10 mg via INTRAVENOUS
  Filled 2018-10-12: qty 2

## 2018-10-12 MED ORDER — POTASSIUM CHLORIDE 2 MEQ/ML IV SOLN
INTRAVENOUS | Status: DC
  Administered 2018-10-13 – 2018-10-14 (×3): via INTRAVENOUS
  Filled 2018-10-12 (×4): qty 1000

## 2018-10-12 MED ORDER — ACETAMINOPHEN 160 MG/5ML PO SOLN
650.0000 mg | Freq: Four times a day (QID) | ORAL | Status: DC | PRN
  Administered 2018-10-13 – 2018-10-14 (×2): 650 mg
  Filled 2018-10-12 (×2): qty 20.3

## 2018-10-12 MED ORDER — BUPRENORPHINE HCL-NALOXONE HCL 8-2 MG SL SUBL
1.0000 | SUBLINGUAL_TABLET | Freq: Two times a day (BID) | SUBLINGUAL | Status: DC
  Administered 2018-10-13 – 2018-10-16 (×6): 1 via SUBLINGUAL
  Filled 2018-10-12 (×7): qty 1

## 2018-10-12 MED ORDER — PANTOPRAZOLE SODIUM 40 MG PO TBEC
40.0000 mg | DELAYED_RELEASE_TABLET | Freq: Every day | ORAL | Status: DC
  Administered 2018-10-12 – 2018-10-14 (×3): 40 mg via ORAL
  Filled 2018-10-12 (×3): qty 1

## 2018-10-12 MED ORDER — SODIUM CHLORIDE 0.9 % IV BOLUS
1000.0000 mL | Freq: Once | INTRAVENOUS | Status: AC
  Administered 2018-10-12: 1000 mL via INTRAVENOUS

## 2018-10-12 MED ORDER — IOHEXOL 300 MG/ML  SOLN
100.0000 mL | Freq: Once | INTRAMUSCULAR | Status: AC | PRN
  Administered 2018-10-12: 100 mL via INTRAVENOUS

## 2018-10-12 MED ORDER — ENOXAPARIN SODIUM 40 MG/0.4ML ~~LOC~~ SOLN
40.0000 mg | SUBCUTANEOUS | Status: DC
  Administered 2018-10-13 – 2018-10-14 (×2): 40 mg via SUBCUTANEOUS
  Filled 2018-10-12 (×2): qty 0.4

## 2018-10-12 MED ORDER — ADULT MULTIVITAMIN W/MINERALS CH
1.0000 | ORAL_TABLET | Freq: Every day | ORAL | Status: DC
  Administered 2018-10-13 – 2018-10-16 (×4): 1 via ORAL
  Filled 2018-10-12 (×4): qty 1

## 2018-10-12 MED ORDER — ONDANSETRON HCL 4 MG/5ML PO SOLN
4.0000 mg | Freq: Four times a day (QID) | ORAL | Status: DC | PRN
  Filled 2018-10-12: qty 5

## 2018-10-12 MED ORDER — ONDANSETRON HCL 4 MG/2ML IJ SOLN: 4.0000 mg | Freq: Four times a day (QID) | INTRAMUSCULAR | Status: DC | PRN
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
## 2018-10-12 NOTE — ED Notes (Signed)
Pt st's he has vomited x's 2 today
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
## 2018-10-12 NOTE — ED Triage Notes (Signed)
Pt here with c/o pain around his peg tube , pt pulled a well pump out two days ago and may have aggravated his site

## 2018-10-12 NOTE — ED Notes (Signed)
Dr. Janee Morn in to assess pt at this time
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
## 2018-10-13 ENCOUNTER — Inpatient Hospital Stay (HOSPITAL_COMMUNITY): Payer: Medicaid Other

## 2018-10-13 DIAGNOSIS — R509 Fever, unspecified: Secondary | ICD-10-CM

## 2018-10-13 DIAGNOSIS — K56600 Partial intestinal obstruction, unspecified as to cause: Principal | ICD-10-CM

## 2018-10-13 DIAGNOSIS — E876 Hypokalemia: Secondary | ICD-10-CM

## 2018-10-13 DIAGNOSIS — Z888 Allergy status to other drugs, medicaments and biological substances status: Secondary | ICD-10-CM

## 2018-10-13 DIAGNOSIS — K209 Esophagitis, unspecified: Secondary | ICD-10-CM

## 2018-10-13 DIAGNOSIS — Z9049 Acquired absence of other specified parts of digestive tract: Secondary | ICD-10-CM

## 2018-10-13 DIAGNOSIS — Z931 Gastrostomy status: Secondary | ICD-10-CM

## 2018-10-13 DIAGNOSIS — Z9089 Acquired absence of other organs: Secondary | ICD-10-CM

## 2018-10-13 DIAGNOSIS — K222 Esophageal obstruction: Secondary | ICD-10-CM

## 2018-10-13 DIAGNOSIS — Z79899 Other long term (current) drug therapy: Secondary | ICD-10-CM

## 2018-10-13 DIAGNOSIS — F1190 Opioid use, unspecified, uncomplicated: Secondary | ICD-10-CM

## 2018-10-13 DIAGNOSIS — Z885 Allergy status to narcotic agent status: Secondary | ICD-10-CM

## 2018-10-13 DIAGNOSIS — I10 Essential (primary) hypertension: Secondary | ICD-10-CM

## 2018-10-13 DIAGNOSIS — F17210 Nicotine dependence, cigarettes, uncomplicated: Secondary | ICD-10-CM

## 2018-10-13 DIAGNOSIS — I739 Peripheral vascular disease, unspecified: Secondary | ICD-10-CM

## 2018-10-13 LAB — BASIC METABOLIC PANEL
Anion gap: 16 — ABNORMAL HIGH (ref 5–15)
BUN: 15 mg/dL (ref 6–20)
CO2: 19 mmol/L — ABNORMAL LOW (ref 22–32)
Calcium: 9.5 mg/dL (ref 8.9–10.3)
Chloride: 103 mmol/L (ref 98–111)
Creatinine, Ser: 0.71 mg/dL (ref 0.61–1.24)
GFR calc Af Amer: >60 mL/min (ref >60)
GFR calc non Af Amer: >60 mL/min (ref >60)
Glucose, Bld: 129 mg/dL — ABNORMAL HIGH (ref 70–99)
Potassium: 3.2 mmol/L — ABNORMAL LOW (ref 3.5–5.1)
Sodium: 138 mmol/L (ref 135–145)

## 2018-10-13 LAB — MAGNESIUM: Magnesium: 1.9 mg/dL (ref 1.7–2.4)

## 2018-10-13 LAB — CBC
HCT: 46.8 % (ref 39.0–52.0)
Hemoglobin: 16.4 g/dL (ref 13.0–17.0)
MCH: 30 pg (ref 26.0–34.0)
MCHC: 35 g/dL (ref 30.0–36.0)
MCV: 85.7 fL (ref 80.0–100.0)
Platelets: 306 10*3/uL (ref 150–400)
RBC: 5.46 MIL/uL (ref 4.22–5.81)
RDW: 13 % (ref 11.5–15.5)
WBC: 17.1 10*3/uL — ABNORMAL HIGH (ref 4.0–10.5)
nRBC: 0 % (ref 0.0–0.2)

## 2018-10-13 LAB — LACTIC ACID, PLASMA
Lactic Acid, Venous: 1.3 mmol/L (ref 0.5–1.9)
Lactic Acid, Venous: 1.6 mmol/L (ref 0.5–1.9)

## 2018-10-13 LAB — C-REACTIVE PROTEIN: CRP: <0.8 mg/dL (ref <1.0)

## 2018-10-13 MED ORDER — PIPERACILLIN-TAZOBACTAM 3.375 G IVPB
3.3750 g | Freq: Three times a day (TID) | INTRAVENOUS | Status: DC
  Administered 2018-10-13 – 2018-10-16 (×9): 3.375 g via INTRAVENOUS
  Filled 2018-10-13 (×9): qty 50

## 2018-10-13 MED ORDER — FENTANYL CITRATE (PF) 100 MCG/2ML IJ SOLN: 25.0000 ug | INTRAMUSCULAR | Status: DC | PRN

## 2018-10-13 MED ORDER — POTASSIUM CHLORIDE 10 MEQ/100ML IV SOLN
10.0000 meq | INTRAVENOUS | Status: AC
  Administered 2018-10-13 (×2): 10 meq via INTRAVENOUS
  Filled 2018-10-13 (×2): qty 100

## 2018-10-13 MED ORDER — DIATRIZOATE MEGLUMINE & SODIUM 66-10 % PO SOLN
90.0000 mL | Freq: Once | ORAL | Status: AC
  Administered 2018-10-13: 90 mL
  Filled 2018-10-13: qty 90

## 2018-10-13 NOTE — Discharge Summary (Signed)
Name: Vincent Clarke MRN: [id number] DOB: [birth year] 53 y.o. PCP: Reymundo Poll, MD  Date of Admission: 10/12/2018  1:15 PM Date of Discharge: October 16, 2018 Attending Physician: Anne Shutter, MD  Discharge Diagnosis: 1.  Partial small bowel obstruction 2.  Mallory-Weiss tear 3.  Esophageal stenosis status post PEG tube placement 4.  Fever of unclear etiology 5.  Opioid use disorder 6.  Hypertension  Discharge Medications: Allergies as of 10/16/2018      Reactions   Cyclobenzaprine Swelling   Facial swelling Facial swelling   Tramadol Other (See Comments)   Unknown reaction      Medication List    TAKE these medications   buprenorphine-naloxone 8-2 mg Subl SL tablet Commonly known as:  SUBOXONE Place 1 tablet under the tongue 2 (two) times daily.   feeding supplement (JEVITY 1.5 CAL/FIBER) Liqd Place 300 mLs into feeding tube 4 (four) times daily.   feeding supplement (PRO-STAT SUGAR FREE 64) Liqd Place 30 mLs into feeding tube daily.   free water Soln Place 200 mLs into feeding tube 4 (four) times daily.   multivitamin with minerals Tabs tablet Take 1 tablet by mouth daily.   pantoprazole 20 MG tablet Commonly known as:  PROTONIX Take 4 tablets (80 mg total) by mouth 2 (two) times daily. What changed:    medication strength  how much to take  when to take this   sucralfate 1 GM/10ML suspension Commonly known as:  CARAFATE Take 10 mLs (1 g total) by mouth 4 (four) times daily -  with meals and at bedtime for 30 days.       Disposition and follow-up:   Vincent Clarke was discharged from [facility] in [place] condition.  At the hospital follow up visit please address:  1.  Mallory-Weiss tear: Please ensure compliance with Protonix, Carafate and gastroenterology follow-up.  Also supposed to be on a clear liquid diet  2.  Labs / imaging needed at time of follow-up: None  3.  Pending labs/ test needing follow-up: H. pylori  biopsy, GI panel, BMP  Follow-up Appointments: Follow-up Information    Armbruster, Willaim Rayas, MD. Schedule an appointment as soon as possible for a visit.   Specialty:  Gastroenterology Contact information: [address] [place] [address] [place] [address] [phone]           Hospital Course by problem list: 1.  Partial small bowel obstruction  [name] is a 53 year old gentleman with hypertension, high-grade esophageal stenosis felt to be secondary to opioid use disorder and severe esophagitis status post PEG tube placement who presented to [facility] emergency department with a 2-day history of progressive abdominal pain.  On arrival, CT abdomen and pelvis revealed partial versus early complete small bowel obstruction.  Office obstructions were thought to be secondary to previous abdominal surgeries including cholecystectomy and appendectomy.  Though he had image findings of SBO, he continued to pass flatus and bowel movement.  He was made n.p.o. and given IV fluids.  This resolved with medical management and we resumed clear liquid diet.  2.  Mallory-Weiss tear: During this admission, he reported of episodes of coffee-ground emesis and melanic stools.  He underwent an EGD through his PEG tube which revealed GI bleed secondary to Mallory-Weiss tear thought to be due to severe vomiting.  EGD also showed severe erosive esophagitis, duodenal erosion in the second portion.  He was started on IV Protonix 80 mg twice daily and transition to oral  Protonix 80 mg twice daily and Carafate.  He was asked to continue clear liquid diet and follow-up with gastroenterology for possible gradual Savary dilation of esophagus.  3.  Esophageal stenosis status post PEG tube placement: He has had three inpatient EGD's since January 2020. Initially showed esophagitis and he was started on pantoprazole and sucralfate (which he could not afford). He was hospitalized again in February for worsening of his severe  esophagitis and esophageal stricture. At that time he had 2 additional EGD's and the second with attempted dilation which failed. This was followed with a PEG tube placement. He is able to swallow liquids but still has a difficult time with solid foods. He was referred to [facility] where he had another EGD on 4/23 with electroincision and balloon dilation.  4.  Fever of unclear etiology: On arrival, he had an elevated leukocytosis of 16 and spiked fever the same night of admission.  We did not have any clear source of infection but he did report having transient diarrhea.  His white count continue to elevate with peak of 20.  He was started on vancomycin and Zosyn with resolution of fevers and leukocytosis.  His blood cultures remained negative, C. difficile antigen/toxin were also unremarkable.  5.  Opioid use disorder: He was maintained on home Suboxone.  6.  Hypokalemia: Solved with IV and oral repletion    Discharge Vitals:   BP 118/79 (BP Location: Left Arm)   Pulse 77   Temp 98.3 F (36.8 C) (Oral)   Resp 16   Ht 5\' 5"  (1.651 m)   Wt 69.2 kg   SpO2 98%   BMI 25.39 kg/m   Pertinent Labs, Studies, and Procedures:  EGD on 2018-10-22  1. Recent upper GI bleed secondary to Mallory-Weiss tear 2. Mallory-Weiss tear secondary to vomiting 3. Vomiting secondary to transient small bowel obstruction 4. Severe erosive esophagitis through the majority of the esophagus with diffuse narrowing, though a 5.4 mm ultrathin endoscope traversed the esophagus without difficulty. Biopsies taken 5. Status post PEG placement without complications 6. Duodenal erosions in the second portion status post CLO biopsy.  Impression: 1. Clear liquid diet 2. Recommend high-dose liquid PPI (the equivalent of pantoprazole 80 mg twice daily) 3. Follow-up biopsies 4. I believe that this patient would be a candidate for gradual Savary dilation of the esophagus over time. Healing of his esophagitis will be  paramount. Hopefully this is reflux related, as it appears, and not something else such as lichen planus. 5. Would be okay for discharge home tomorrow if stable overnight.   CT ABDOMEN AND PELVIS WITH CONTRAST  TECHNIQUE: Multidetector CT imaging of the abdomen and pelvis was performed using the standard protocol following bolus administration of intravenous contrast.  CONTRAST:  OMNIPAQUE IOHEXOL 300 MG/ML  SOLN  COMPARISON:  07/14/2018  FINDINGS: Lower chest: Heart size and vascularity are normal. Minimal atelectasis at the right lung base posteriorly.  Hepatobiliary: No focal liver abnormality. Status post cholecystectomy. No biliary dilatation.  Pancreas: Normal.  Spleen: Normal.  Adrenals/Urinary Tract: Normal adrenal glands, kidneys, and bladder.  Stomach/Bowel: Gastrostomy tube appears in good position in the stomach. Stomach is otherwise normal. There are several slightly distended small bowel loops in the left mid abdomen. Colon appears normal. Distal small bowel is normal. Appendix has been removed.  Vascular/Lymphatic: Aortic atherosclerosis. No enlarged abdominal or pelvic lymph nodes.  Reproductive: Prostate is unremarkable.  Other: No free air or free fluid or abdominal wall hernia.  Musculoskeletal: Normal.  IMPRESSION: Slightly dilated small bowel loops in the left mid abdomen. This could represent a partial or early complete small bowel obstruction.  Gastrostomy tube in good position.  Aortic atherosclerosis ICD 10-I70.0)  Discharge Instructions: Discharge Instructions    Diet - low sodium heart healthy   Complete by:  As directed    Discharge instructions   Complete by:  As directed    Please be sure to follow-up with the gastroenterologist as he suggested. Also, please make certain to take your medications as prescribed.  If you have any questions please call your gastroenterologist.   Increase activity slowly    Complete by:  As directed       Signed: Yvette RackAgyei, Cadden Elizondo K, MD 10/16/2018, 1:49 PM   Pager: [phone] IMTS PGY-1

## 2018-10-13 NOTE — Progress Notes (Signed)
1020: Per Leary Roca, PA gastrografin okay to admin through G tube without new image of site taken beforehand.   1151: Brooke in Radiology Department aware that gastrografin has been admin. G tube clamped at this time. Will continue to monitor.

## 2018-10-13 NOTE — Progress Notes (Signed)
Subjective: CC: Abdominal pain Patient sleeping. When awoken, he complains of generalized abdominal pain without localization. Denies nausea but did have 1 episode of emesis since being on floor. Continues to pass flatus. Last BM was yesterday. Has no mobilized.   Objective: Vital signs in last 24 hours: Temp:  [98.5 F (36.9 C)-100.6 F (38.1 C)] 99 F (37.2 C) (06/03 0536) Pulse Rate:  [69-99] 75 (06/03 0536) Resp:  [16-20] 20 (06/03 0536) BP: (143-167)/(70-102) 151/88 (06/03 0536) SpO2:  [94 %-98 %] 98 % (06/03 0536)    Intake/Output from previous day: 06/02 0701 - 06/03 0700 In: 1000 [IV Piggyback:1000] Out: -  Intake/Output this shift: No intake/output data recorded.  PE: Gen: Sleeping. Able to be awoken but appears drowsy Heart: RRR Lungs: CTA b/l. Mild tachypnea Abd: Soft, ND, generalized tenderness without rigidity or gaurding. +BS. G-tube in place.  Msk: No edema   Lab Results:  Recent Labs    10/12/18 1536 10/13/18 0427  WBC 16.6* 17.1*  HGB 17.4* 16.4  HCT 49.7 46.8  PLT 333 306   BMET Recent Labs    10/12/18 1536 10/13/18 0427  NA 138 138  K 3.5 3.2*  CL 106 103  CO2 21* 19*  GLUCOSE 136* 129*  BUN 12 15  CREATININE 0.72 0.71  CALCIUM 9.8 9.5   PT/INR No results for input(s): LABPROT, INR in the last 72 hours. CMP     Component Value Date/Time   NA 138 10/13/2018 0427   K 3.2 (L) 10/13/2018 0427   CL 103 10/13/2018 0427   CO2 19 (L) 10/13/2018 0427   GLUCOSE 129 (H) 10/13/2018 0427   BUN 15 10/13/2018 0427   CREATININE 0.71 10/13/2018 0427   CALCIUM 9.5 10/13/2018 0427   PROT 7.5 10/12/2018 1536   ALBUMIN 4.1 10/12/2018 1536   AST 21 10/12/2018 1536   ALT 31 10/12/2018 1536   ALKPHOS 106 10/12/2018 1536   BILITOT 0.8 10/12/2018 1536   GFRNONAA >60 10/13/2018 0427   GFRAA >60 10/13/2018 0427   Lipase     Component Value Date/Time   LIPASE 132 (H) 10/12/2018 1536       Studies/Results: Ct Abdomen Pelvis W  Contrast  Result Date: 10/12/2018 CLINICAL DATA:  Acute abdominal pain. EXAM: CT ABDOMEN AND PELVIS WITH CONTRAST TECHNIQUE: Multidetector CT imaging of the abdomen and pelvis was performed using the standard protocol following bolus administration of intravenous contrast. CONTRAST:  100mL OMNIPAQUE IOHEXOL 300 MG/ML  SOLN COMPARISON:  07/14/2018 FINDINGS: Lower chest: Heart size and vascularity are normal. Minimal atelectasis at the right lung base posteriorly. Hepatobiliary: No focal liver abnormality. Status post cholecystectomy. No biliary dilatation. Pancreas: Normal. Spleen: Normal. Adrenals/Urinary Tract: Normal adrenal glands, kidneys, and bladder. Stomach/Bowel: Gastrostomy tube appears in good position in the stomach. Stomach is otherwise normal. There are several slightly distended small bowel loops in the left mid abdomen. Colon appears normal. Distal small bowel is normal. Appendix has been removed. Vascular/Lymphatic: Aortic atherosclerosis. No enlarged abdominal or pelvic lymph nodes. Reproductive: Prostate is unremarkable. Other: No free air or free fluid or abdominal wall hernia. Musculoskeletal: Normal. IMPRESSION: Slightly dilated small bowel loops in the left mid abdomen. This could represent a partial or early complete small bowel obstruction. Gastrostomy tube in good position. Aortic atherosclerosis ICD 10-I70.0) Electronically Signed   By: Francene BoyersJames  Maxwell M.D.   On: 10/12/2018 17:25   Dg Chest Port 1 View  Result Date: 10/12/2018 CLINICAL DATA:  The patient reports  pain about his feeding tube. EXAM: PORTABLE CHEST 1 VIEW COMPARISON:  CT chest 06/22/2018. Single-view of the chest 05/10/2018. FINDINGS: Lungs clear. Heart size normal. Aortic atherosclerosis noted. No pneumothorax or pleural fluid. No acute or focal bony abnormality. IMPRESSION: No acute disease. Atherosclerosis. Electronically Signed   By: Drusilla Kanner M.D.   On: 10/12/2018 15:31    Anti-infectives: Anti-infectives  (From admission, onward)   Start     Dose/Rate Route Frequency Ordered Stop   10/13/18 0715  piperacillin-tazobactam (ZOSYN) IVPB 3.375 g     3.375 g 12.5 mL/hr over 240 Minutes Intravenous Every 8 hours 10/13/18 0712         Assessment/Plan Fever - UA, CXR negative. CTAP scan without cause of fever. Defer to medicine.  Hx of esophageal stricture  HTN PVD Asthma Hx of drug use   PSBO  - Hx prior cholecystectomy, appendectomy and PEG tube placement - Will avoid NG tube given hx of esophageal stricture  - SBO protocol by G tube. Spoke with nurse to ensure this was done through G-tube  - Bowel rest, IVF, nausea & pain control - Mobilize and IS  FEN - NPO, IVF VTE - SCD, lovenox ID - Zosyn 6/3 >> WBC 16.6>17.1 (appears to be some degree of hemoconcentration)    LOS: 1 day    Jacinto Halim , [facility] 10/13/2018, 9:40 AM Pager: [phone]

## 2018-10-13 NOTE — Progress Notes (Addendum)
   Subjective: HD#1   Overnight: Spiked a fever of 100.6 F.  Today, [name] was observed resting in bed. Somnolent but answers questions appropriately. Mentions abdominal pain (5-6/10, diffuse abdominal pain), nausea and clear emesis overnight. Denies any fevers or chills. He has been passing flatus. Last bowel movement yesterday described as regular. Explained plan for observation and starting antibiotics.   Objective:  Vital signs in last 24 hours: Vitals:   10/12/18 2045 10/13/18 0032 10/13/18 0100 10/13/18 0536  BP: (!) 155/94 (!) 161/89  (!) 151/88  Pulse: 83 72  75  Resp:  20  20  Temp:  (!) 100.6 F (38.1 C) 99.8 F (37.7 C) 99 F (37.2 C)  TempSrc:  Oral  Oral  SpO2: 94% 98%  98%   Const: Sleepy HEENT: Atraumatic, normocephalic Resp: CTA BL, no wheezes, crackles, rhonchi CV: RRR, no murmurs, gallop, rub Abd: PEG tube in place, abdomen diffusely tender to palpation, bowel sounds present Ext: No lower extremity edema, skin is warm to touch  Assessment/Plan:  Active Problems:   SBO (small bowel obstruction) (HCC)  Vincent Clarke is a 53 y.o male with HTN, PAD, and 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 CT abdomen showed partial or early complete SBO.  #Partial small bowel obstruction: Upon further review, he is status post cholecystectomy and appendectomy which could have caused adhesions leading to partial small bowel obstruction.  This morning, he continues to endorse generalized abdominal pain with nausea.  He states that he had an episode of clear emesis after moved to the floor.  He continues to pass flatus and denies fevers, chills.  He was noted to have a fever of 100.6 overnight leukocytosis up to 17. Given the findings of systemic infection, I wonder if he has some form of bowel ischemia.  Leukocytosis might be due to hemoconcentration as  his urinalysis showed elevated specific gravity with ketones and proteinuria.  He is status post 1 L bolus of normal saline in the ED.  -Keep n.p.o. -Continue Protonix 40 mg daily -Continue IVF with LR at 100 mL/hour -Zofran PRN nausea -Appreciate general surgery recommendation -Start Zosyn -Monitor CBC -Follow up Lactic acid -Encourage mobilization  #Esophageal stenosis s/p PEG tube placement: He has had an extensive work-up with multiple EGDs which confirmed esophageal stricture.  He is status post PEG tube placement and maintained on pantoprazole and sucralfate.  -Continue PEG tube placement -Continue pantoprazole  #Opioid use disorder: continue suboxone 8-2 mg bid   #Hypokalemia: K+ of 3.2.  Status post IV KCl 10 mEq x 2 and 20 additional mEq in LR  -Continue to monitor  #Hypertension: BP this a.m. 150s/80s.  Will not initiate antihypertensives at this point and will continue to monitor.  If hypertension persists, will start amlodipine. -No antihypertensives at home  Diet: NPO VTE ppx: Lovenox CODE STATUS: Full Code  Dispo: Anticipate discharge in 2 to 3 days.   Yvette Rack, MD 10/13/2018, 6:45 AM Pager: [phone] IMTS PGY-1

## 2018-10-13 NOTE — Progress Notes (Signed)
Pharmacy Antibiotic Note  Vincent Clarke is a 53 y.o. male admitted on 10/12/2018 with intra abdominal infection.  Pharmacy has been consulted for zosyn dosing.  Plan: Zosyn 3.375g IV q8h (4 hour infusion).  F/u cultures and clinical course     Temp (24hrs), Avg:99.5 F (37.5 C), Min:98.5 F (36.9 C), Max:100.6 F (38.1 C)  Recent Labs  Lab 10/12/18 1536 10/13/18 0427  WBC 16.6* 17.1*  CREATININE 0.72 0.71    CrCl cannot be calculated (Unknown ideal weight.).    Allergies  Allergen Reactions  . Cyclobenzaprine Swelling    Facial swelling Facial swelling  . Tramadol Other (See Comments)    Unknown reaction     Thank you for allowing pharmacy to be a part of this patient's care.  Talbert Cage [place]et 10/13/2018 7:12 AM

## 2018-10-14 ENCOUNTER — Inpatient Hospital Stay (HOSPITAL_COMMUNITY): Payer: Medicaid Other

## 2018-10-14 DIAGNOSIS — K222 Esophageal obstruction: Secondary | ICD-10-CM

## 2018-10-14 DIAGNOSIS — K2210 Ulcer of esophagus without bleeding: Secondary | ICD-10-CM

## 2018-10-14 DIAGNOSIS — K921 Melena: Secondary | ICD-10-CM

## 2018-10-14 DIAGNOSIS — R509 Fever, unspecified: Secondary | ICD-10-CM | POA: Diagnosis present

## 2018-10-14 DIAGNOSIS — Z791 Long term (current) use of non-steroidal anti-inflammatories (NSAID): Secondary | ICD-10-CM

## 2018-10-14 DIAGNOSIS — K922 Gastrointestinal hemorrhage, unspecified: Secondary | ICD-10-CM

## 2018-10-14 LAB — BASIC METABOLIC PANEL
Anion gap: 9 (ref 5–15)
Anion gap: 10 (ref 5–15)
Anion gap: 6 (ref 5–15)
BUN: 21 mg/dL — ABNORMAL HIGH (ref 6–20)
BUN: 15 mg/dL (ref 6–20)
BUN: 15 mg/dL (ref 6–20)
CO2: 23 mmol/L (ref 22–32)
CO2: 19 mmol/L — ABNORMAL LOW (ref 22–32)
CO2: 23 mmol/L (ref 22–32)
Calcium: 9 mg/dL (ref 8.9–10.3)
Calcium: 8.6 mg/dL — ABNORMAL LOW (ref 8.9–10.3)
Calcium: 8.1 mg/dL — ABNORMAL LOW (ref 8.9–10.3)
Chloride: 107 mmol/L (ref 98–111)
Chloride: 108 mmol/L (ref 98–111)
Chloride: 107 mmol/L (ref 98–111)
Creatinine, Ser: 0.63 mg/dL (ref 0.61–1.24)
Creatinine, Ser: 0.54 mg/dL — ABNORMAL LOW (ref 0.61–1.24)
Creatinine, Ser: 0.64 mg/dL (ref 0.61–1.24)
GFR calc Af Amer: >60 mL/min (ref >60)
GFR calc Af Amer: >60 mL/min (ref >60)
GFR calc Af Amer: >60 mL/min (ref >60)
GFR calc non Af Amer: >60 mL/min (ref >60)
GFR calc non Af Amer: >60 mL/min (ref >60)
GFR calc non Af Amer: >60 mL/min (ref >60)
Glucose, Bld: 132 mg/dL — ABNORMAL HIGH (ref 70–99)
Glucose, Bld: 101 mg/dL — ABNORMAL HIGH (ref 70–99)
Glucose, Bld: 127 mg/dL — ABNORMAL HIGH (ref 70–99)
Potassium: 2.5 mmol/L — CL (ref 3.5–5.1)
Potassium: 2.9 mmol/L — ABNORMAL LOW (ref 3.5–5.1)
Potassium: 3 mmol/L — ABNORMAL LOW (ref 3.5–5.1)
Sodium: 139 mmol/L (ref 135–145)
Sodium: 137 mmol/L (ref 135–145)
Sodium: 136 mmol/L (ref 135–145)

## 2018-10-14 LAB — CBC WITH DIFFERENTIAL/PLATELET
Abs Immature Granulocytes: 0.08 10*3/uL — ABNORMAL HIGH (ref 0.00–0.07)
Basophils Absolute: 0 10*3/uL (ref 0.0–0.1)
Basophils Relative: 0 %
Eosinophils Absolute: 0 10*3/uL (ref 0.0–0.5)
Eosinophils Relative: 0 %
HCT: 41.7 % (ref 39.0–52.0)
Hemoglobin: 14.7 g/dL (ref 13.0–17.0)
Immature Granulocytes: 0 %
Lymphocytes Relative: 9 %
Lymphs Abs: 1.8 10*3/uL (ref 0.7–4.0)
MCH: 30.2 pg (ref 26.0–34.0)
MCHC: 35.3 g/dL (ref 30.0–36.0)
MCV: 85.8 fL (ref 80.0–100.0)
Monocytes Absolute: 2.3 10*3/uL — ABNORMAL HIGH (ref 0.1–1.0)
Monocytes Relative: 11 %
Neutro Abs: 16.1 10*3/uL — ABNORMAL HIGH (ref 1.7–7.7)
Neutrophils Relative %: 80 %
Platelets: 293 10*3/uL (ref 150–400)
RBC: 4.86 MIL/uL (ref 4.22–5.81)
RDW: 13.2 % (ref 11.5–15.5)
WBC: 20.3 10*3/uL — ABNORMAL HIGH (ref 4.0–10.5)
nRBC: 0 % (ref 0.0–0.2)

## 2018-10-14 LAB — PHOSPHORUS: Phosphorus: 3.2 mg/dL (ref 2.5–4.6)

## 2018-10-14 LAB — MAGNESIUM: Magnesium: 1.9 mg/dL (ref 1.7–2.4)

## 2018-10-14 MED ORDER — JEVITY 1.2 CAL PO LIQD: 1000.0000 mL | ORAL | Status: DC

## 2018-10-14 MED ORDER — POTASSIUM CHLORIDE 10 MEQ/100ML IV SOLN
10.0000 meq | INTRAVENOUS | Status: DC
  Administered 2018-10-14: 10 meq via INTRAVENOUS

## 2018-10-14 MED ORDER — VANCOMYCIN HCL IN DEXTROSE 1-5 GM/200ML-% IV SOLN
1000.0000 mg | Freq: Two times a day (BID) | INTRAVENOUS | Status: DC
  Administered 2018-10-14 – 2018-10-16 (×3): 1000 mg via INTRAVENOUS
  Filled 2018-10-14 (×5): qty 200

## 2018-10-14 MED ORDER — VANCOMYCIN HCL 10 G IV SOLR
1250.0000 mg | Freq: Once | INTRAVENOUS | Status: AC
  Administered 2018-10-14: 1250 mg via INTRAVENOUS
  Filled 2018-10-14: qty 1250

## 2018-10-14 MED ORDER — POTASSIUM CHLORIDE 10 MEQ/100ML IV SOLN
10.0000 meq | INTRAVENOUS | Status: AC
  Administered 2018-10-14 (×4): 10 meq via INTRAVENOUS
  Filled 2018-10-14 (×4): qty 100

## 2018-10-14 MED ORDER — POTASSIUM CHLORIDE 10 MEQ/100ML IV SOLN
10.0000 meq | INTRAVENOUS | Status: DC
  Filled 2018-10-14: qty 100

## 2018-10-14 MED ORDER — PRO-STAT SUGAR FREE PO LIQD
30.0000 mL | Freq: Every day | ORAL | Status: DC
  Administered 2018-10-14 – 2018-10-16 (×3): 30 mL
  Filled 2018-10-14 (×2): qty 30

## 2018-10-14 MED ORDER — OSMOLITE 1.5 CAL PO LIQD
300.0000 mL | Freq: Four times a day (QID) | ORAL | Status: DC
  Administered 2018-10-14 – 2018-10-15 (×4): 300 mL
  Administered 2018-10-15: 237 mL
  Administered 2018-10-16: 300 mL
  Filled 2018-10-14 (×11): qty 474

## 2018-10-14 MED ORDER — POTASSIUM CHLORIDE 10 MEQ/100ML IV SOLN
10.0000 meq | INTRAVENOUS | Status: AC
  Administered 2018-10-14 (×3): 10 meq via INTRAVENOUS
  Filled 2018-10-14 (×3): qty 100

## 2018-10-14 MED ORDER — PANTOPRAZOLE SODIUM 40 MG IV SOLR
40.0000 mg | Freq: Two times a day (BID) | INTRAVENOUS | Status: DC
  Administered 2018-10-14 – 2018-10-15 (×3): 40 mg via INTRAVENOUS
  Filled 2018-10-14 (×3): qty 40

## 2018-10-14 MED ORDER — POTASSIUM CHLORIDE 10 MEQ/100ML IV SOLN: 10.0000 meq | INTRAVENOUS | Status: DC

## 2018-10-14 MED ORDER — SODIUM CHLORIDE 0.9% FLUSH: 10.0000 mL | INTRAVENOUS | Status: DC | PRN
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
## 2018-10-14 NOTE — Progress Notes (Signed)
Pharmacy Antibiotic Note  Vincent Clarke is a 53 y.o. male with fevers and leukocytosis.  Pharmacy has been consulted for Vancomycin dosing.  Plan: Vancomycin 1250 mg IV now, then 1 g IV q12h  Height: 5\' 5"  (165.1 cm) Weight: 150 lb 12.7 oz (68.4 kg) IBW/kg (Calculated) : 61.5  Temp (24hrs), Avg:99.5 F (37.5 C), Min:98.6 F (37 C), Max:100.6 F (38.1 C)  Recent Labs  Lab 10/12/18 1536 10/13/18 0427 10/13/18 0734 10/13/18 1018 10/14/18 0437  WBC 16.6* 17.1*  --   --  20.3*  CREATININE 0.72 0.71  --   --  0.63  LATICACIDVEN  --   --  1.3 1.6  --     Estimated Creatinine Clearance: 91.8 mL/min (by C-G formula based on SCr of 0.63 mg/dL).    Allergies  Allergen Reactions  . Cyclobenzaprine Swelling    Facial swelling Facial swelling  . Tramadol Other (See Comments)    Unknown reaction    Eddie Candle 10/14/2018 7:03 AM

## 2018-10-14 NOTE — Progress Notes (Signed)
Patient has a critical K+ of 2.6 repoted result to on call for internal medicine.

## 2018-10-14 NOTE — Social Work (Signed)
CSW has pending consult for "consult to adult protective services." CSW contacted MD regarding need for this consult, per MD this is entered in error.   CSW signing off. Please consult if any additional needs arise.  Doy Hutching, LCSWA [facility] Clinical Social Work [phone]

## 2018-10-14 NOTE — Progress Notes (Addendum)
   Subjective: HD#2   Overnight: Febrile with Tmax of 100.69F  Today, JEFERSON REYMOND states he feels 'alright.' He states his pain has not significantly changed. Described as sharp abdominal pain without radiation. Mentions an episode of nausea and vomiting with 'a bunch of' black emesis. Also had couple episodes of diarrhea with black watery stools. Denies fevers, chills, heart burn, BRBPR, hematemesis. Denies any dysuria, urgency or frequency.  Upon further questioning, mentions intermittent 'couple' pills of Alleve use daily for abdominal pain.  Discussed plan to get GI on board for evaluation.  Objective:  Vital signs in last 24 hours: Vitals:   10/13/18 1952 10/13/18 1954 10/14/18 0412 10/14/18 0419  BP: (!) 167/100 (!) 167/96 (!) 156/78 (!) 160/74  Pulse: 94 91 83 78  Resp: 14 14 18 16   Temp: (!) 100.5 F (38.1 C) 99.3 F (37.4 C) (!) 100.6 F (38.1 C) 99.1 F (37.3 C)  TempSrc: Oral Oral Oral Oral  SpO2: 99% 98% 98% 97%  Weight:      Height:       Const: In NAD, lying in bed, mood labile HEENT: Atraumatic, normocephalic, no sclera icterus  Resp: CTA BL, no wheezes, crackles, rhonchi CV: RRR, no murmurs, gallop, rub Abd: Bowel sounds present, nondistended, PEG tube in place Skin: Warm, no rashes   Assessment/Plan:  Principal Problem:   SBO (small bowel obstruction) (HCC) Active Problems:   HTN (hypertension)   Opioid use disorder, moderate, in early remission (HCC)   Esophagitis, erosive   Esophageal stenosis   S/P percutaneous endoscopic gastrostomy (PEG) tube placement (HCC)  Davin Mcadoo is a 53 y.o male with HTN, PAD, and 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  #Partial small bowel obstruction-resolved: Abdominal pain is stable. Mildly TTP. He still endorses nausea, vomiting.  -Keep  n.p.o. -Continue IVF with LR at 100 mL/hour -Continue Zosyn -Zofran PRN nausea -Encourage mobilization  #GI Bleed: he had an episode of coffee ground emesis and melana last night. Endorses daily use of NSAIDs prior to admission. DDx includes gastritis vs PUD.  -Hgb stable -Appreciate GI reccs  -IV protonix 40mg  BID  #Fever of unknown etiology: Continues to spike fever with Tmax of 100.69F without no clear etiology despite starting antibiotics. In addition, leukocytosis continues to persist. WBC 20<<17. Will start looking for possible sources of infection including osteomyelitis vs Endocarditis due to hx of IVDU vs Cellulitis  -Add Vancomycin -Continue Zosyn -MRI of spine  -Continue to monitor CBC -F/u Blood culture, stool studies, C. diff  #Esophageal stenosis s/p PEG tube placement: s/p PEG tube placement and maintained on pantoprazole and sucralfate.  -Continue PEG tube placement -Continue pantoprazole  #Opioid use disorder: continue suboxone 8-2 mg bid   #Hypokalemia: K+ this am 2.5. -IV KCL  #Hypertension:  -If hypertension persists, will start amlodipine. -No antihypertensives at home  Diet: NPO VTE ppx: Discontinue Lovenox and start SCDs. CODE STATUS: Full Code  Dispo: Anticipate discharge in 2 to 3 days.   Yvette Rack, MD 10/14/2018, 6:55 AM Pager: [phone] IMTS PGY-1

## 2018-10-14 NOTE — Plan of Care (Signed)

## 2018-10-14 NOTE — Progress Notes (Signed)
Initial Nutrition Assessment  RD working remotely.  DOCUMENTATION CODES:   Not applicable  INTERVENTION:   -Initiate bolus feedings of 300 ml Osmolite 1.5 QID vis PEG  30 ml Prostat daily.    Tube feeding regimen provides 1900 kcal (100% of needs), 90 grams of protein, and 914 ml of H2O.   -When no IVFs, consider 200 ml free water flush QID between feedings (Total free water: 1714 ml)  NUTRITION DIAGNOSIS:   Inadequate oral intake related to altered GI function as evidenced by NPO status.  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 CT abdomen showed partial or early complete SBO.  Pt admitted with partial SBO.   Reviewed I/O's: +1.5 L x 24 hours and +2.5 L since admission  UOP: 0 ml x 24 hours  Attempted to call pt via phone to obtain further nutrition hx, however, pt did not answer. Unable to obtain further nutrition-related history at this time.   Pt s/p PEG placement in 06/2018 secondary to high grade esophageal stenosis/stricture. Chart review indicates that pt is able to consume some clear liquids, however, vast majority of nutrition is via PEG feedings. Per last RD note on 06/29/18, home bolus feeding regimen recommendations were 300 ml Jevity 1.5 QID with 30 ml Prostat daily and 200 ml free water flush 4 times daily (regimen provides 1900 kcals, 92 grams protein, and 1712 ml free water daily, meeting 100% of estimated kcal and protein needs). However, per GI notes, pt indicates he takes 7-8 cans of Ensure via PEG daily (providing between 1540 kcals and 1760  kcals daily and 63-72 grams protein daily, meeting 91-100% of estimated kcal needs 70-80% of estimated protein needs).   Reviewed wt hx; pt has experienced a 5.8% wt loss over the past 3 months, which is not significant for time frame.   Per GI notes, possibility for EGD today or tomorrow.   Plan to re-start TF per general surgery notes. Will trial low fiber formula secondary to SBO.   Labs reviewed: K: 2.5 (on IV supplementation). Mg and Phos WDL.   NUTRITION - FOCUSED PHYSICAL EXAM:    Most Recent Value  Orbital Region  Unable to assess  Upper Arm Region  Unable to assess  Thoracic and Lumbar Region  Unable to assess  Buccal Region  Unable to assess  Temple Region  Unable to assess  Clavicle Bone Region  Unable to assess  Clavicle and Acromion Bone Region  Unable to assess  Scapular Bone Region  Unable to assess  Dorsal Hand  Unable to assess  Patellar Region  Unable to assess  Anterior Thigh Region  Unable to assess  Posterior Calf Region  Unable to assess  Edema (RD Assessment)  Unable to assess  Hair  Unable to assess  Eyes  Unable to assess  Mouth  Unable to assess  Skin  Unable to assess  Nails  Unable to assess       Diet Order:   Diet Order            Diet NPO  time specified Except for: Citigroupce Chips, Sips with Meds  Diet effective now              EDUCATION NEEDS:   No education needs have been identified at this time  Skin:  Skin Assessment: Reviewed RN Assessment  Last BM:  10/13/18  Height:   Ht Readings from Last 1 Encounters:  10/13/18 5\' 5"  (1.651 m)    Weight:   Wt Readings from Last 1 Encounters:  10/13/18 68.4 kg    Ideal Body Weight:  61.8 kg  BMI:  Body mass index is 25.09 kg/m.  Estimated Nutritional Needs:   Kcal:  1700-1900  Protein:  90-105 grams  Fluid:  > 1.7 L    Kemara Quigley A. Mayford KnifeWilliams, RD, LDN, CDCES Registered Dietitian II Certified Diabetes Care and Education Specialist Pager: [phone] After hours Pager:  [phone]

## 2018-10-14 NOTE — Final Consult Note (Addendum)
Consultant Final Sign-Off Note    Assessment/Final recommendations  Vincent Clarke is a 53 y.o. male followed by me for partial small bowel obstruction.  Addendum - we will follow for an additional day to ensure that he tolerates tube feeds.   Wound care (if applicable): N/A   Diet at discharge: per primary team. Tube feed's ordered per dietitian.    Activity at discharge: per primary team   Follow-up appointment:  PRN   Pending results:  Unresulted Labs (From admission, onward)    Start     Ordered   10/14/18 1300  Basic metabolic panel  Once-Timed,   R     10/14/18 0821   10/14/18 0935  Gastrointestinal Panel by PCR , Stool  (Gastrointestinal Panel by PCR, Stool)  Once,   R     10/14/18 0935   10/14/18 0934  C difficile quick scan w PCR reflex  (C Difficile quick screen w PCR reflex panel)  Once, for 24 hours,   R     10/14/18 0933   10/14/18 0822  Phosphorus  Add-on,   R     10/14/18 0821   10/14/18 0742  Magnesium  Add-on,   R     10/14/18 0741   10/14/18 0722  Culture, blood (routine x 2)  BLOOD CULTURE X 2,   R     10/14/18 7510           Medication recommendations: Patient does not need to be on abx from a surgical standpoint. His elevated WBC count does not appear to be coming from his abdomen. Internal medicine appears to be looking for other causes of patient's elevated WBC per their note.    Other recommendations: Restart tube feed's.     Thank you for allowing [place] to participate in the care of your patient!  Please consult [place] again if you have further needs for your patient.  [name] [facility] 10/14/2018 9:39 AM    Subjective   Patient reports some epigastric abdominal pain. No N/V yesterday since after I saw him or this morning. Has had two soft BM's. Passing flatus. SBO Xrays w/ contrast w/in the colon and without signs of obstruction.   Objective  Vital signs in last 24 hours: Temp:  [98.6 F (37 C)-100.6 F (38.1 C)] 99.1 F (37.3 C) (06/04  0419) Pulse Rate:  [75-99] 78 (06/04 0419) Resp:  [14-20] 16 (06/04 0419) BP: (150-167)/(74-100) 160/74 (06/04 0419) SpO2:  [97 %-100 %] 97 % (06/04 0419) Weight:  [68.4 kg] 68.4 kg (06/03 1122)  Gen: Sleeping. Able to be awoken and appears alert, NAD Heart: RRR Lungs: CTA b/l. Mild tachypnea Abd: Soft, ND, mild tenderness of the epigastrium without rigidity or gaurding. +BS. G-tube in place.  Msk: No edema   Pertinent labs and Studies: Recent Labs    10/12/18 1536 10/13/18 0427 10/14/18 0437  WBC 16.6* 17.1* 20.3*  HGB 17.4* 16.4 14.7  HCT 49.7 46.8 41.7   BMET Recent Labs    10/13/18 0427 10/14/18 0437  NA 138 139  K 3.2* 2.5*  CL 103 107  CO2 19* 23  GLUCOSE 129* 132*  BUN 15 21*  CREATININE 0.71 0.63  CALCIUM 9.5 9.0   No results for input(s): LABURIN in the last 72 hours. Results for orders placed or performed during the hospital encounter of 10/12/18  SARS Coronavirus 2 (CEPHEID - Performed in [facility] hospital lab), Hosp Order     Status: None   Collection Time:  10/12/18  6:21 PM  Result Value Ref Range Status   SARS Coronavirus 2 NEGATIVE NEGATIVE Final    Comment: (NOTE) If result is NEGATIVE SARS-CoV-2 target nucleic acids are NOT DETECTED. The SARS-CoV-2 RNA is generally detectable in upper and lower  respiratory specimens during the acute phase of infection. The lowest  concentration of SARS-CoV-2 viral copies this assay can detect is 250  copies / mL. A negative result does not preclude SARS-CoV-2 infection  and should not be used as the sole basis for treatment or other  patient management decisions.  A negative result may occur with  improper specimen collection / handling, submission of specimen other  than nasopharyngeal swab, presence of viral mutation(s) within the  areas targeted by this assay, and inadequate number of viral copies  (<250 copies / mL). A negative result must be combined with clinical  observations, patient history, and  epidemiological information. If result is POSITIVE SARS-CoV-2 target nucleic acids are DETECTED. The SARS-CoV-2 RNA is generally detectable in upper and lower  respiratory specimens dur ing the acute phase of infection.  Positive  results are indicative of active infection with SARS-CoV-2.  Clinical  correlation with patient history and other diagnostic information is  necessary to determine patient infection status.  Positive results do  not rule out bacterial infection or co-infection with other viruses. If result is PRESUMPTIVE POSTIVE SARS-CoV-2 nucleic acids MAY BE PRESENT.   A presumptive positive result was obtained on the submitted specimen  and confirmed on repeat testing.  While 2019 novel coronavirus  (SARS-CoV-2) nucleic acids may be present in the submitted sample  additional confirmatory testing may be necessary for epidemiological  and / or clinical management purposes  to differentiate between  SARS-CoV-2 and other Sarbecovirus currently known to infect humans.  If clinically indicated additional testing with an alternate test  methodology [phone]) is advised. The SARS-CoV-2 RNA is generally  detectable in upper and lower respiratory sp ecimens during the acute  phase of infection. The expected result is Negative. Fact Sheet for Patients:  BoilerBrush.com.cyhttps://www.fda.gov/media/136312/download Fact Sheet for Healthcare Providers: https://pope.com/https://www.fda.gov/media/136313/download This test is not yet approved or cleared by the [place] FDA and has been authorized for detection and/or diagnosis of SARS-CoV-2 by FDA under an Emergency Use Authorization (EUA).  This EUA will remain in effect (meaning this test can be used) for the duration of the COVID-19 declaration under Section 564(b)(1) of the Act, 21 U.S.C. section 360bbb-3(b)(1), unless the authorization is terminated or revoked sooner. Performed at [facility], [address] [place], [place] [id number]      Imaging: Dg Abd Portable 1v-small Bowel Obstruction Protocol-24 Hr Delay  Result Date: 10/14/2018 CLINICAL DATA:  Follow up small bowel obstruction EXAM: PORTABLE ABDOMEN - 1 VIEW COMPARISON:  10/13/2018 FINDINGS: Contrast material is noted scattered throughout the colon. Previously seen residual in the small bowel has resolved. Gastrostomy catheter is noted. IMPRESSION: Contrast now lies completely within the colon when compared with the previous exam. No definitive obstructive changes are seen. Electronically Signed   By: Alcide CleverMark  Lukens M.D.   On: 10/14/2018 08:21   Dg Abd Portable 1v-small Bowel Obstruction Protocol-initial, 8 Hr Delay  Result Date: 10/13/2018 CLINICAL DATA:  Small-bowel obstruction EXAM: PORTABLE ABDOMEN - 1 VIEW COMPARISON:  10/12/2018 FINDINGS: Oral contrast is seen to the level of the rectum. There are mildly dilated loops of small bowel scattered throughout the abdomen. No pneumatosis or free air. IMPRESSION: Oral contrast is seen to the level  of the rectum. There is mild distention of multiple small bowel loops, likely improved from prior CT. Electronically Signed   By: Katherine Mantle M.D.   On: 10/13/2018 20:36

## 2018-10-15 ENCOUNTER — Encounter (HOSPITAL_COMMUNITY): Payer: Self-pay

## 2018-10-15 ENCOUNTER — Inpatient Hospital Stay (HOSPITAL_COMMUNITY): Payer: Medicaid Other | Admitting: Registered Nurse

## 2018-10-15 ENCOUNTER — Encounter (HOSPITAL_COMMUNITY)
Admission: EM | Disposition: A | Discharge status: Home / Self Care | Payer: Self-pay | Source: Home / Self Care | Attending: Internal Medicine

## 2018-10-15 DIAGNOSIS — K226 Gastro-esophageal laceration-hemorrhage syndrome: Secondary | ICD-10-CM

## 2018-10-15 DIAGNOSIS — E876 Hypokalemia: Secondary | ICD-10-CM

## 2018-10-15 HISTORY — PX: BIOPSY: SHX5522

## 2018-10-15 HISTORY — PX: ESOPHAGOGASTRODUODENOSCOPY (EGD) WITH PROPOFOL: SHX5813

## 2018-10-15 LAB — HEPATIC FUNCTION PANEL
ALT: 19 U/L (ref 0–44)
AST: 12 U/L — ABNORMAL LOW (ref 15–41)
Albumin: 2.7 g/dL — ABNORMAL LOW (ref 3.5–5.0)
Alkaline Phosphatase: 60 U/L (ref 38–126)
Bilirubin, Direct: <0.1 mg/dL (ref 0.0–0.2)
Total Bilirubin: 0.8 mg/dL (ref 0.3–1.2)
Total Protein: 5.1 g/dL — ABNORMAL LOW (ref 6.5–8.1)

## 2018-10-15 LAB — BASIC METABOLIC PANEL
Anion gap: 7 (ref 5–15)
Anion gap: 6 (ref 5–15)
BUN: 12 mg/dL (ref 6–20)
BUN: 10 mg/dL (ref 6–20)
CO2: 23 mmol/L (ref 22–32)
CO2: 22 mmol/L (ref 22–32)
Calcium: 7.8 mg/dL — ABNORMAL LOW (ref 8.9–10.3)
Calcium: 7.9 mg/dL — ABNORMAL LOW (ref 8.9–10.3)
Chloride: 108 mmol/L (ref 98–111)
Chloride: 108 mmol/L (ref 98–111)
Creatinine, Ser: 0.53 mg/dL — ABNORMAL LOW (ref 0.61–1.24)
Creatinine, Ser: 0.64 mg/dL (ref 0.61–1.24)
GFR calc Af Amer: >60 mL/min (ref >60)
GFR calc Af Amer: >60 mL/min (ref >60)
GFR calc non Af Amer: >60 mL/min (ref >60)
GFR calc non Af Amer: >60 mL/min (ref >60)
Glucose, Bld: 89 mg/dL (ref 70–99)
Glucose, Bld: 141 mg/dL — ABNORMAL HIGH (ref 70–99)
Potassium: 2.8 mmol/L — ABNORMAL LOW (ref 3.5–5.1)
Potassium: 3.2 mmol/L — ABNORMAL LOW (ref 3.5–5.1)
Sodium: 138 mmol/L (ref 135–145)
Sodium: 136 mmol/L (ref 135–145)

## 2018-10-15 LAB — LIPASE, BLOOD: Lipase: 32 U/L (ref 11–51)

## 2018-10-15 LAB — CBC
HCT: 36 % — ABNORMAL LOW (ref 39.0–52.0)
Hemoglobin: 12.5 g/dL — ABNORMAL LOW (ref 13.0–17.0)
MCH: 30.5 pg (ref 26.0–34.0)
MCHC: 34.7 g/dL (ref 30.0–36.0)
MCV: 87.8 fL (ref 80.0–100.0)
Platelets: 216 10*3/uL (ref 150–400)
RBC: 4.1 MIL/uL — ABNORMAL LOW (ref 4.22–5.81)
RDW: 13 % (ref 11.5–15.5)
WBC: 12.4 10*3/uL — ABNORMAL HIGH (ref 4.0–10.5)
nRBC: 0 % (ref 0.0–0.2)

## 2018-10-15 LAB — C DIFFICILE QUICK SCREEN W PCR REFLEX
C Diff antigen: NEGATIVE
C Diff interpretation: NOT DETECTED
C Diff toxin: NEGATIVE

## 2018-10-15 LAB — GLUCOSE, CAPILLARY: Glucose-Capillary: 98 mg/dL (ref 70–99)

## 2018-10-15 SURGERY — ESOPHAGOGASTRODUODENOSCOPY (EGD) WITH PROPOFOL
Anesthesia: Monitor Anesthesia Care

## 2018-10-15 MED ORDER — LIDOCAINE 2% (20 MG/ML) 5 ML SYRINGE
INTRAMUSCULAR | Status: DC | PRN
  Administered 2018-10-15: 40 mg via INTRAVENOUS

## 2018-10-15 MED ORDER — POTASSIUM CHLORIDE 10 MEQ/100ML IV SOLN
10.0000 meq | INTRAVENOUS | Status: AC
  Administered 2018-10-15 (×4): 10 meq via INTRAVENOUS
  Filled 2018-10-15 (×4): qty 100

## 2018-10-15 MED ORDER — POTASSIUM CHLORIDE 10 MEQ/100ML IV SOLN
10.0000 meq | INTRAVENOUS | Status: AC
  Administered 2018-10-15 – 2018-10-16 (×2): 10 meq via INTRAVENOUS
  Filled 2018-10-15 (×2): qty 100

## 2018-10-15 MED ORDER — POTASSIUM CHLORIDE CRYS ER 20 MEQ PO TBCR
40.0000 meq | EXTENDED_RELEASE_TABLET | Freq: Two times a day (BID) | ORAL | Status: DC
  Administered 2018-10-15: 40 meq via ORAL
  Filled 2018-10-15: qty 2

## 2018-10-15 MED ORDER — PHENYLEPHRINE HCL (PRESSORS) 10 MG/ML IV SOLN
INTRAVENOUS | Status: DC | PRN
  Administered 2018-10-15: 80 ug via INTRAVENOUS

## 2018-10-15 MED ORDER — POTASSIUM CHLORIDE 2 MEQ/ML IV SOLN
INTRAVENOUS | Status: DC
  Administered 2018-10-15: 14:00:00 via INTRAVENOUS
  Filled 2018-10-15 (×3): qty 1000

## 2018-10-15 MED ORDER — PROPOFOL 500 MG/50ML IV EMUL
INTRAVENOUS | Status: DC | PRN
  Administered 2018-10-15: 125 ug/kg/min via INTRAVENOUS

## 2018-10-15 MED ORDER — LACTATED RINGERS IV SOLN
INTRAVENOUS | Status: DC | PRN
  Administered 2018-10-15: 11:00:00 via INTRAVENOUS

## 2018-10-15 MED ORDER — PROPOFOL 10 MG/ML IV BOLUS
INTRAVENOUS | Status: DC | PRN
  Administered 2018-10-15: 20 mg via INTRAVENOUS

## 2018-10-15 MED ORDER — SODIUM CHLORIDE 0.9 % IV SOLN
80.0000 mg | Freq: Two times a day (BID) | INTRAVENOUS | Status: DC
  Administered 2018-10-15 – 2018-10-16 (×2): 80 mg via INTRAVENOUS
  Filled 2018-10-15 (×3): qty 80

## 2018-10-15 MED ORDER — LACTATED RINGERS IV SOLN
INTRAVENOUS | Status: DC
  Administered 2018-10-15: 10:00:00 via INTRAVENOUS

## 2018-10-15 SURGICAL SUPPLY — 15 items

## 2018-10-15 NOTE — Progress Notes (Addendum)
Daily Rounding Note  10/15/2018, 9:07 AM  LOS: 3 days   SUBJECTIVE:   Chief complaint:     No further black stools and no n/v No abd pain.   Feels worn out BPs and hear rate stable.  Last fever 100.6 at 12:30 yest.   Resident ordered stool panel, thus pt on enteric contact precautions, no spec collected yet.    OBJECTIVE:         Vital signs in last 24 hours:    Temp:  [97.8 F (36.6 C)-98.9 F (37.2 C)] 97.8 F (36.6 C) (06/05 0417) Pulse Rate:  [71-82] 71 (06/05 0417) Resp:  [17-19] 19 (06/05 0417) BP: (121-130)/(75-90) 122/83 (06/05 0417) SpO2:  [97 %-99 %] 97 % (06/05 0417) Last BM Date: 10/14/18 Filed Weights   10/13/18 1122  Weight: 68.4 kg   General: sleepy, arouseable.  Not reexamined   Chest: no cough or dyspnea Neuro/Psych:  Arouseable, alert x 3.    Intake/Output from previous day: 06/04 0701 - 06/05 0700 In: 430 [P.O.:180; IV Piggyback:250] Out: 150 [Urine:150]  Intake/Output this shift: No intake/output data recorded.  Lab Results: Recent Labs    10/13/18 0427 10/14/18 0437 10/15/18 0332  WBC 17.1* 20.3* 12.4*  HGB 16.4 14.7 12.5*  HCT 46.8 41.7 36.0*  PLT 306 293 216   BMET Recent Labs    10/14/18 1324 10/14/18 1942 10/15/18 0332  NA 137 136 138  K 2.9* 3.0* 2.8*  CL 108 107 108  CO2 19* 23 23  GLUCOSE 101* 127* 89  BUN 15 15 12   CREATININE 0.54* 0.64 0.53*  CALCIUM 8.6* 8.1* 7.8*   LFT Recent Labs    10/12/18 1536 10/15/18 0332  PROT 7.5 5.1*  ALBUMIN 4.1 2.7*  AST 21 12*  ALT 31 19  ALKPHOS 106 60  BILITOT 0.8 0.8  BILIDIR  --  <0.1  IBILI  --  NOT CALCULATED   PT/INR No results for input(s): LABPROT, INR in the last 72 hours. Hepatitis Panel No results for input(s): HEPBSAG, HCVAB, HEPAIGM, HEPBIGM in the last 72 hours.  Studies/Results: Dg Abd Portable 1v-small Bowel Obstruction Protocol-24 Hr Delay  Result Date: 10/14/2018 CLINICAL DATA:  Follow up  small bowel obstruction EXAM: PORTABLE ABDOMEN - 1 VIEW COMPARISON:  10/13/2018 FINDINGS: Contrast material is noted scattered throughout the colon. Previously seen residual in the small bowel has resolved. Gastrostomy catheter is noted. IMPRESSION: Contrast now lies completely within the colon when compared with the previous exam. No definitive obstructive changes are seen. Electronically Signed   By: Alcide CleverMark  Lukens M.D.   On: 10/14/2018 08:21   Dg Abd Portable 1v-small Bowel Obstruction Protocol-initial, 8 Hr Delay  Result Date: 10/13/2018 CLINICAL DATA:  Small-bowel obstruction EXAM: PORTABLE ABDOMEN - 1 VIEW COMPARISON:  10/12/2018 FINDINGS: Oral contrast is seen to the level of the rectum. There are mildly dilated loops of small bowel scattered throughout the abdomen. No pneumatosis or free air. IMPRESSION: Oral contrast is seen to the level of the rectum. There is mild distention of multiple small bowel loops, likely improved from prior CT. Electronically Signed   By: Katherine Mantlehristopher  Green M.D.   On: 10/13/2018 20:36   Scheduled Meds: . buprenorphine-naloxone  1 tablet Sublingual BID  . feeding supplement (OSMOLITE 1.5 CAL)  300 mL Per Tube QID  . feeding supplement (PRO-STAT SUGAR FREE 64)  30 mL Per Tube Daily  . multivitamin with minerals  1 tablet Oral Daily  .  pantoprazole (PROTONIX) IV  40 mg Intravenous Q12H   Continuous Infusions: . lactated ringers with kcl    . piperacillin-tazobactam (ZOSYN)  IV 3.375 g (10/15/18 0520)  . potassium chloride 10 mEq (10/15/18 0819)  . vancomycin 1,000 mg (10/14/18 2214)   PRN Meds:.acetaminophen (TYLENOL) oral liquid 160 mg/5 mL, ondansetron (ZOFRAN) IV **OR** ondansetron, sodium chloride flush   ASSESMENT:   *   GI bleed,  ulcer disease?, bleeding from known esophageal stricture?, bleeding from G tube?, bleeding from small bowel ? Blood pressure and heart rate stable On Protonix 40 mg IV BID.    *   Partial SBO, resolved.  *   Fever to 100.6  @ 12:30 yesterday, afebrile since.  WBCs 20.3 >> 12.4.  Day 2 Vanc, day 3 Zosyn.    *   2.5 g Hgb decline.  Suspect some of this was from IV fluids/rehydration but some of this may be from blood loss anemia.  *    Elevated lipase, no signs of pancreatitis or biliary ductal abnormalities on CT scan.  LFTs normal.    *    Severe esophageal stricture, status post cutting and dilation 09/02/2018.  *    Feeding G-tube placed 06/2018, continues to use this for feeding and meds. Last TF at 2200 last night.      PLAN   *  5 runs potassium ordered, 10 meq 1 hour x 5.  Received 2 runs so far, next run due at 0845.  *   EGD today at 11 AM.  If unable to traverse esoph stricture, MD may need to attempt vis via G tube site.      Jennye Moccasin  10/15/2018, 9:07 AM Phone [phone]  GI ATTENDING  Interval history and data reviewed.  Patient personally seen and examined.  Problems with abdominal pain and vomiting have resolved.  Coffee-ground emesis resolved.  No evidence for GI bleeding.  Hemoglobin drifted from 14.7-12.5 after hydration.  I believe the initial insult was partial small bowel obstruction which has resolved.  He has hypokalemia which is being corrected.  He is for endoscopy this morning to assess his esophagus and rule out significant cause for bleeding.  This may or may not be achievable given the known severe stricturing of his esophagus.  We will start with the ultrathin upper endoscope.The nature of the procedure, as well as the risks, benefits, and alternatives were carefully and thoroughly reviewed with the patient. Ample time for discussion and questions allowed. The patient understood, was satisfied, and agreed to proceed.  Wilhemina Bonito. Eda Keys., M.D. [facility] Division of Gastroenterology

## 2018-10-15 NOTE — Transfer of Care (Signed)
Immediate Anesthesia Transfer of Care Note  Patient: Vincent Clarke  Procedure(s) Performed: ESOPHAGOGASTRODUODENOSCOPY (EGD) WITH PROPOFOL (N/A ) BIOPSY  Patient Location: Endoscopy Unit  Anesthesia Type:MAC  Level of Consciousness: awake, alert  and oriented  Airway & Oxygen Therapy: Patient Spontanous Breathing and Patient connected to nasal cannula oxygen  Post-op Assessment: Report given to RN and Post -op Vital signs reviewed and stable  Post vital signs: Reviewed and stable  Last Vitals:  Vitals Value Taken Time  BP 121/61 10/15/2018 11:44 AM  Temp    Pulse 66 10/15/2018 11:46 AM  Resp 31 10/15/2018 11:46 AM  SpO2 98 % 10/15/2018 11:46 AM  Vitals shown include unvalidated device data.  Last Pain:  Vitals:   10/15/18 1025  TempSrc: Oral  PainSc: 0-No pain         Complications: No apparent anesthesia complications

## 2018-10-15 NOTE — Anesthesia Preprocedure Evaluation (Signed)
Anesthesia Evaluation  Patient identified by MRN, date of birth, ID band Patient awake    Reviewed: Allergy & Precautions, NPO status , Patient's Chart, lab work & pertinent test results  Airway Mallampati: II  TM Distance: >3 FB Neck ROM: Full    Dental  (+) Edentulous Upper, Edentulous Lower   Pulmonary Current Smoker,    breath sounds clear to auscultation       Cardiovascular hypertension,  Rhythm:Regular Rate:Normal     Neuro/Psych    GI/Hepatic   Endo/Other    Renal/GU      Musculoskeletal   Abdominal   Peds  Hematology   Anesthesia Other Findings   Reproductive/Obstetrics                             Anesthesia Physical Anesthesia Plan  ASA: III  Anesthesia Plan: MAC   Post-op Pain Management:    Induction:   PONV Risk Score and Plan: Ondansetron and Propofol infusion  Airway Management Planned: Natural Airway and Nasal Cannula  Additional Equipment:   Intra-op Plan:   Post-operative Plan:   Informed Consent: I have reviewed the patients History and Physical, chart, labs and discussed the procedure including the risks, benefits and alternatives for the proposed anesthesia with the patient or authorized representative who has indicated his/her understanding and acceptance.       Plan Discussed with: CRNA and Anesthesiologist  Anesthesia Plan Comments:         Anesthesia Quick Evaluation

## 2018-10-15 NOTE — Progress Notes (Signed)
   Subjective: HD#3   Overnight: No acute events reported  Today, [name] was examined at bedside and states he is doing much better today.  He only had one episode of loose stools overnight and it was black.  He denies any further episodes of hemoptysis, hematemesis, NBNB emesis.  Denies fevers, chills, nausea, abdominal pain.  On further questioning, he states he has never used IV drugs.  Objective:  Vital signs in last 24 hours: Vitals:   10/14/18 0419 10/14/18 1401 10/14/18 1933 10/15/18 0417  BP: (!) 160/74 130/90 121/75 122/83  Pulse: 78 82 78 71  Resp: 16 17 18 19   Temp: 99.1 F (37.3 C) 98.9 F (37.2 C) 98.6 F (37 C) 97.8 F (36.6 C)  TempSrc: Oral Oral Oral Oral  SpO2: 97% 98% 99% 97%  Weight:      Height:       Const: In no apparent distress, lying comfortably in bed, conversational HEENT: Atraumatic, normocephalic Resp: CTA BL, no wheezes, crackles, rhonchi CV: RRR, no murmurs, gallop, rub Abd: BS+, PEG tube in place, site is clean without erythema or induration Ext: No lower extremity edema  Assessment/Plan:  Principal Problem:   SBO (small bowel obstruction) (HCC) Active Problems:   HTN (hypertension)   Opioid use disorder, moderate, in early remission (HCC)   Esophagitis, erosive   Esophageal stenosis   S/P percutaneous endoscopic gastrostomy (PEG) tube placement (HCC)   Fever, unspecified   Melena  Vincent Clarke is a 53 y.o male with HTN, PAD, and 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  #Partial small bowel obstruction-resolved:His initial CT abdomen and pelvis did not show any evidence of intra-abdominal infection.  I wonder if he had a transient gastroenteritis.  Still pending GI panel, C. difficile, blood culture. -Keep n.p.o. except for meds -Continue IVF with LR at 100  mL/hour -Zofran PRN nausea  #GI Bleed: Denies any further episode of hemoptysis, hematemesis.  He had 1 episode of loose stool which he described as being black in color.  Currently denies headaches, dizziness, blurry vision. Hgb slightly downtrended 12<<14 (17 on admission). GI plans to perform EGD today. DDx includes gastritis vs PUD.  -Appreciate GI reccs  -IV protonix 40mg  BID  #Fever of unknown etiology: He remained afebrile overnight and leukocytosis is trending down.  DDx includes osteomyelitis-very less likely given his negative CRP and also the fact that he denies any history of IV drug use.  The possibilities include endocarditis  vs Cellulitis vs gastroenteritis.  Blood cultures has been remained no growth for less than 24 hours. -Continue Zosyn and vancomycin  -MRI of spine and TTE after EGD today if he continues to spike fever/persistent leukocytosis -Continue to monitor CBC -F/u Blood culture, stool studies, C. diff  #Esophageal stenosiss/pPEG tube placement:s/p PEG tube placement and maintained on pantoprazole and sucralfate. -Continue PEG tube placement -Continue pantoprazole  #Opioid use disorder: continue suboxone 8-2 mg bid  #Hypokalemia: K+ this am 2.8 -Will replete  -Continue maintenance LR with K+.  #Hypertension:  -BP stable   Diet: NPO, Tube feed VTE ppx: Discontinue Lovenox and start SCDs. CODE STATUS:Full Code  Dispo:Anticipate discharge in 2 to 3 days.  Yvette Rack, MD 10/15/2018, 6:28 AM Pager: [phone] IMTS PGY-1

## 2018-10-15 NOTE — Progress Notes (Signed)
Nutrition Follow-up  DOCUMENTATION CODES:   Not applicable  INTERVENTION:   -Continue bolus feedings of 300 ml Osmolite 1.5 QID vis PEG  30 ml Prostat daily.    Tube feeding regimen provides 1900 kcal (100% of needs), 90 grams of protein, and 914 ml of H2O.   -When no IVFs, consider 200 ml free water flush QID between feedings (Total free water: 1714 ml)  NUTRITION DIAGNOSIS:   Inadequate oral intake related to altered GI function as evidenced by NPO status.  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 CT abdomen showed partial or early complete SBO.  Reviewed I/O's: +280 ml x 24 hours and +2.8 L since admission  UOP: 150 ml x 24 hours  Pt advanced to clear liquids after RD visit yesterday. Per general surgery notes, pt was able to tolerate TF.   Attempted to speak with pt to obtain further history, however, pt out of room (in EGD) at time of visit.   Pt remains on bolus feedings: 300 ml Osmolite 1.5 QID with 30 ml Prostat daily. Tube feeding regimen provides 1900 kcal (100% of needs), 90 grams of protein, and 914 ml of H2O.   Labs reviewed: K: 2.8.   Diet Order:   Diet Order            Diet clear liquid Room service appropriate? Yes; Fluid consistency: Thin  Diet effective now              EDUCATION NEEDS:   No education needs have been identified at this time  Skin:  Skin Assessment: Reviewed RN Assessment  Last BM:  10/13/18  Height:   Ht Readings from Last 1 Encounters:  10/13/18 5' 5"  (1.651 m)     Weight:   Wt Readings from Last 1 Encounters:  10/13/18 68.4 kg    Ideal Body Weight:  61.8 kg  BMI:  Body mass index is 25.09 kg/m.  Estimated Nutritional Needs:   Kcal:  1700-1900  Protein:  90-105 grams  Fluid:  > 1.7 L    Vincent Clarke A. Jimmye Norman, RD, LDN, [place] Registered Dietitian II Certified Diabetes Care and Education Specialist Pager: [phone] After hours Pager: [phone]

## 2018-10-15 NOTE — Op Note (Signed)
[facility] Patient Name: Vincent Clarke Procedure Date : 10/15/2018 MRN: [id number] Attending MD: Wilhemina Bonito. Marina Goodell , MD Date of Birth: [birth year] CSN: [id number] Age: 53 Admit Type: Inpatient Procedure:                Upper GI endoscopy with biopsies Indications:              Hematemesis Providers:                Wilhemina Bonito. Marina Goodell, MD Clearnce Sorrel, RN, Arlee Muslim                            Tech., Technician Referring MD:              Medicines:                Monitored Anesthesia Care Complications:            No immediate complications. Estimated Blood Loss:     Estimated blood loss: none. Procedure:                Pre-Anesthesia Assessment:                           - Prior to the procedure, a History and Physical                            was performed, and patient medications and                            allergies were reviewed. The patient's tolerance of                            previous anesthesia was also reviewed. The risks                            and benefits of the procedure and the sedation                            options and risks were discussed with the patient.                            All questions were answered, and informed consent                            was obtained. Prior Anticoagulants: The patient has                            taken heparin, last dose was 1 day prior to                            procedure. ASA Grade Assessment: II - A patient                            with mild systemic disease. After reviewing the  risks and benefits, the patient was deemed in                            satisfactory condition to undergo the procedure.                           After obtaining informed consent, the endoscope was                            passed under direct vision. Throughout the                            procedure, the patient's blood pressure, pulse, and                            oxygen saturations were  monitored continuously. The                            [id number]-[id number] ([id number]) Olympus Ultraslim gastroscope                            was introduced through the mouth, and advanced to                            the second part of duodenum. The upper GI endoscopy                            was accomplished without difficulty. The patient                            tolerated the procedure well. Scope In: Scope Out: Findings:      There was severe erosive esophagitis beginning in the proximal esophagus       and extending to the gastroesophageal junction. The endoscopic       appearance was most consistent with severe reflux esophagitis. Biopsies       of the esophagitis were taken. A non-bleeding Mallory-Weiss tear with       stigmata of recent bleeding was found at the gastroesophageal junction.       Therapy not deemed necessary. Though there was stricturing of the       esophagus throughout from the proximal extent of the esophagitis, and       ultrathin 5.4 mm upper endoscope easily traversed the esophagus.      The stomach was normal except for a sliding hiatal hernia and PEG tube       bumper in the normal position without issues.      The examined duodenum revealed a normal bulb. However, linear ulceration       along the folds of the second portion noted. Changes like this sometimes       seen with ischemia.. Biopsies were taken with a cold forceps for       Helicobacter pylori testing using CLOtest.      The cardia and gastric fundus were normal on retroflexion. Impression:               1. Recent upper GI bleed secondary to Mallory-Weiss  tear                           2. Mallory-Weiss tear secondary to vomiting                           3. Vomiting secondary to transient small bowel                            obstruction                           4. Severe erosive esophagitis through the majority                            of the esophagus with diffuse  narrowing, though a                            5.4 mm ultrathin endoscope traversed the esophagus                            without difficulty. Biopsies taken                           5. Status post PEG placement without complications                           6. Duodenal erosions in the second portion status                            post CLO biopsy. Recommendation:           1. Clear liquid diet                           2. Recommend high-dose liquid PPI (the equivalent                            of pantoprazole 80 mg twice daily)                           3. Follow-up biopsies                           4. I believe that this patient would be a candidate                            for gradual Savary dilation of the esophagus over                            time. Healing of his esophagitis will be paramount.                            Hopefully this is reflux related, as it appears,  and not something else such as lichen planus.                           5. Would be okay for discharge home tomorrow if                            stable overnight. Procedure Code(s):        --- Professional ---                           [phone], Esophagogastroduodenoscopy, flexible,                            transoral; with biopsy, single or multiple Diagnosis Code(s):        --- Professional ---                           K22.6, Gastro-esophageal laceration-hemorrhage                            syndrome                           K92.0, Hematemesis CPT copyright 2019 American Medical Association. All rights reserved. The codes documented in this report are preliminary and upon coder review may  be revised to meet current compliance requirements. Wilhemina Bonito. Marina Goodell, MD 10/15/2018 11:55:29 AM This report has been signed electronically. Number of Addenda: 0

## 2018-10-15 NOTE — Anesthesia Postprocedure Evaluation (Signed)
Anesthesia Post Note  Patient: Vincent Clarke  Procedure(s) Performed: ESOPHAGOGASTRODUODENOSCOPY (EGD) WITH PROPOFOL (N/A ) BIOPSY     Patient location during evaluation: Endoscopy Anesthesia Type: MAC Level of consciousness: awake and alert Pain management: pain level controlled Vital Signs Assessment: post-procedure vital signs reviewed and stable Respiratory status: spontaneous breathing, nonlabored ventilation, respiratory function stable and patient connected to nasal cannula oxygen Cardiovascular status: stable and blood pressure returned to baseline Postop Assessment: no apparent nausea or vomiting Anesthetic complications: no    Last Vitals:  Vitals:   10/15/18 1200 10/15/18 1332  BP: (!) 147/66 124/61  Pulse: 65 75  Resp: 16 18  Temp:  36.9 C  SpO2: 100% 98%    Last Pain:  Vitals:   10/15/18 1332  TempSrc: Oral  PainSc:                  Myldred Raju COKER

## 2018-10-15 NOTE — Final Consult Note (Signed)
Consultant Final Sign-Off Note    Assessment/Final recommendations  Vincent CanFred B Clarke is a 53 y.o. male followed by me for partial small bowel obstruction   Wound care (if applicable): N/A    Diet at discharge: per primary team  tube feed's per dietitian.    Activity at discharge: per primary team   Follow-up appointment:  PRN   Pending results:  Unresulted Labs (From admission, onward)    Start     Ordered   10/15/18 0500  Creatinine, serum  Daily,   R     10/14/18 0940   10/14/18 0935  Gastrointestinal Panel by PCR , Stool  (Gastrointestinal Panel by PCR, Stool)  Once,   R     10/14/18 0935   10/14/18 0934  C difficile quick scan w PCR reflex  (C Difficile quick screen w PCR reflex panel)  Once, for 24 hours,   R     10/14/18 0933   10/14/18 0722  Culture, blood (routine x 2)  BLOOD CULTURE X 2,   R     10/14/18 0721           Medication recommendations: Patient does not need to be on abx from a surgical standpoint. His elevated WBC count does not appear to be coming from his abdomen. Internal medicine appears to be looking for other causes of patient's elevated WBC per their note. WBC is down to 12,400 this AM. K low this morning. Already receiving IV potassium   Other recommendations: Patient to have     Thank you for allowing us to participate in the care of your patient!  Please consult us again if you have further needs for your patient.  Elmer SowMichael M [facility] LL[facility] 10/15/2018 9:30 AM    Subjective   Patient looks much more awake this morning. Reports that his abdominal pain has resolved. Tolerated tube feeds last night (2200) without abdominal pain, N/V. Had a BM yesterday night that was black and tarry. Hgb went from 14.7 to 12.5.  He is supposed to have a EGD today (and possible G-tube site) with GI to eval. Primary team also ordered C. Diff testing. SBO Xrays w/ contrast w/in the colon and without signs of obstruction.   Objective  Vital signs in last 24 hours: Temp:   [97.8 F (36.6 C)-98.9 F (37.2 C)] 97.8 F (36.6 C) (06/05 0417) Pulse Rate:  [71-82] 71 (06/05 0417) Resp:  [17-19] 19 (06/05 0417) BP: (121-130)/(75-90) 122/83 (06/05 0417) SpO2:  [97 %-99 %] 97 % (06/05 0417)  Gen: Awake and alert, NAD Heart: RRR Lungs: CTA b/l. Mild tachypnea Abd: Soft, ND, NT. +BS. G-tube in place.  Msk: No edema  Pertinent labs and Studies: Recent Labs    10/13/18 0427 10/14/18 0437 10/15/18 0332  WBC 17.1* 20.3* 12.4*  HGB 16.4 14.7 12.5*  HCT 46.8 41.7 36.0*   BMET Recent Labs    10/14/18 1942 10/15/18 0332  NA 136 138  K 3.0* 2.8*  CL 107 108  CO2 23 23  GLUCOSE 127* 89  BUN 15 12  CREATININE 0.64 0.53*  CALCIUM 8.1* 7.8*   No results for input(s): LABURIN in the last 72 hours. Results for orders placed or performed during the hospital encounter of 10/12/18  SARS Coronavirus 2 (CEPHEID - Performed [facility]), [facility]     Status: None   Collection Time: 10/12/18  6:21 PM  Result Value Ref Range Status   SARS Coronavirus 2 NEGATIVE NEGATIVE Final  Comment: (NOTE) If result is NEGATIVE SARS-CoV-2 target nucleic acids are NOT DETECTED. The SARS-CoV-2 RNA is generally detectable in upper and lower  respiratory specimens during the acute phase of infection. The lowest  concentration of SARS-CoV-2 viral copies this assay Clarke detect is 250  copies / mL. A negative result does not preclude SARS-CoV-2 infection  and should not be used as the sole basis for treatment or other  patient management decisions.  A negative result may occur with  improper specimen collection / handling, submission of specimen other  than nasopharyngeal swab, presence of viral mutation(s) within the  areas targeted by this assay, and inadequate number of viral copies  (<250 copies / mL). A negative result must be combined with clinical  observations, patient history, and epidemiological information. If result is POSITIVE SARS-CoV-2 target  nucleic acids are DETECTED. The SARS-CoV-2 RNA is generally detectable in upper and lower  respiratory specimens dur ing the acute phase of infection.  Positive  results are indicative of active infection with SARS-CoV-2.  Clinical  correlation with patient history and other diagnostic information is  necessary to determine patient infection status.  Positive results do  not rule out bacterial infection or co-infection with other viruses. If result is PRESUMPTIVE POSTIVE SARS-CoV-2 nucleic acids MAY BE PRESENT.   A presumptive positive result was obtained on the submitted specimen  and confirmed on repeat testing.  While 2019 novel coronavirus  (SARS-CoV-2) nucleic acids may be present in the submitted sample  additional confirmatory testing may be necessary for epidemiological  and / or clinical management purposes  to differentiate between  SARS-CoV-2 and other Sarbecovirus currently known to infect humans.  If clinically indicated additional testing with an alternate test  methodology [phone]) is advised. The SARS-CoV-2 RNA is generally  detectable in upper and lower respiratory sp ecimens during the acute  phase of infection. The expected result is Negative. Fact Sheet for Patients:  BoilerBrush.com.cy Fact Sheet for Healthcare Providers: https://pope.com/ This test is not yet approved or cleared by the [place] FDA and has been authorized for detection and/or diagnosis of SARS-CoV-2 by FDA under an Emergency Use Authorization (EUA).  This EUA will remain in effect (meaning this test Clarke be used) for the duration of the COVID-19 declaration under Section 564(b)(1) of the Act, 21 U.S.C. section 360bbb-3(b)(1), unless the authorization is terminated or revoked sooner. Performed at [facility], [address] [place], [place] [address]     Imaging: No results found.

## 2018-10-16 DIAGNOSIS — K921 Melena: Secondary | ICD-10-CM

## 2018-10-16 DIAGNOSIS — K2210 Ulcer of esophagus without bleeding: Secondary | ICD-10-CM

## 2018-10-16 DIAGNOSIS — K56609 Unspecified intestinal obstruction, unspecified as to partial versus complete obstruction: Secondary | ICD-10-CM

## 2018-10-16 DIAGNOSIS — K226 Gastro-esophageal laceration-hemorrhage syndrome: Secondary | ICD-10-CM

## 2018-10-16 DIAGNOSIS — K2211 Ulcer of esophagus with bleeding: Secondary | ICD-10-CM

## 2018-10-16 LAB — GASTROINTESTINAL PANEL BY PCR, STOOL (REPLACES STOOL CULTURE)

## 2018-10-16 LAB — CBC
HCT: 32.9 % — ABNORMAL LOW (ref 39.0–52.0)
Hemoglobin: 11.4 g/dL — ABNORMAL LOW (ref 13.0–17.0)
MCH: 30.7 pg (ref 26.0–34.0)
MCHC: 34.7 g/dL (ref 30.0–36.0)
MCV: 88.7 fL (ref 80.0–100.0)
Platelets: 210 10*3/uL (ref 150–400)
RBC: 3.71 MIL/uL — ABNORMAL LOW (ref 4.22–5.81)
RDW: 12.8 % (ref 11.5–15.5)
WBC: 9.7 10*3/uL (ref 4.0–10.5)
nRBC: 0 % (ref 0.0–0.2)

## 2018-10-16 LAB — BASIC METABOLIC PANEL
Anion gap: 7 (ref 5–15)
BUN: 8 mg/dL (ref 6–20)
CO2: 24 mmol/L (ref 22–32)
Calcium: 8 mg/dL — ABNORMAL LOW (ref 8.9–10.3)
Chloride: 107 mmol/L (ref 98–111)
Creatinine, Ser: 0.69 mg/dL (ref 0.61–1.24)
GFR calc Af Amer: >60 mL/min (ref >60)
GFR calc non Af Amer: >60 mL/min (ref >60)
Glucose, Bld: 122 mg/dL — ABNORMAL HIGH (ref 70–99)
Potassium: 3.3 mmol/L — ABNORMAL LOW (ref 3.5–5.1)
Sodium: 138 mmol/L (ref 135–145)

## 2018-10-16 LAB — GLUCOSE, CAPILLARY
Glucose-Capillary: 101 mg/dL — ABNORMAL HIGH (ref 70–99)
Glucose-Capillary: 124 mg/dL — ABNORMAL HIGH (ref 70–99)
Glucose-Capillary: 97 mg/dL (ref 70–99)
Glucose-Capillary: 111 mg/dL — ABNORMAL HIGH (ref 70–99)

## 2018-10-16 LAB — CLOTEST (H. PYLORI), BIOPSY: Helicobacter screen: NEGATIVE — AB

## 2018-10-16 MED ORDER — PANTOPRAZOLE SODIUM 20 MG PO TBEC: 80.0000 mg | DELAYED_RELEASE_TABLET | Freq: Two times a day (BID) | ORAL | 1 refills | Status: DC

## 2018-10-16 MED ORDER — PANTOPRAZOLE SODIUM 40 MG PO PACK
80.0000 mg | PACK | Freq: Two times a day (BID) | ORAL | Status: DC
  Filled 2018-10-16: qty 40

## 2018-10-16 MED ORDER — SUCRALFATE 1 GM/10ML PO SUSP: 1.0000 g | Freq: Three times a day (TID) | ORAL | 2 refills | Status: DC

## 2018-10-16 MED ORDER — PANTOPRAZOLE SODIUM 40 MG PO PACK: 80.0000 mg | PACK | Freq: Two times a day (BID) | ORAL | 2 refills | Status: DC

## 2018-10-16 MED ORDER — SUCRALFATE 1 GM/10ML PO SUSP: 1.0000 g | Freq: Three times a day (TID) | ORAL | 0 refills | Status: DC

## 2018-10-16 NOTE — Discharge Instructions (Signed)
You will need to continue the tube feedings as you were as well as your liquid diet while you follow with the GI doctor. Please make certain to call them to be sure you have an appointment.

## 2018-10-16 NOTE — Plan of Care (Signed)
  Problem: Health Behavior/Discharge Planning: Goal: Ability to manage health-related needs will improve Outcome: Adequate for Discharge   Problem: Clinical Measurements: Goal: Will remain free from infection Outcome: Adequate for Discharge Goal: Diagnostic test results will improve Outcome: Adequate for Discharge   Problem: Nutrition: Goal: Adequate nutrition will be maintained Outcome: Adequate for Discharge

## 2018-10-16 NOTE — Progress Notes (Signed)
      Progress Note   Subjective  Patient doing well, had EGD yesterday with Dr. Henrene Pastor, was able to traverse the stricture and get into the stomach. Bleeding coming from [employer] tear / severe esophagitis which has since stopped. No dark stools, HGb stable. BUN normal   Objective   Vital signs in last 24 hours: Temp:  [98.4 F (36.9 C)-98.7 F (37.1 C)] 98.7 F (37.1 C) (06/06 0346) Pulse Rate:  [61-75] 61 (06/06 0346) Resp:  [15-34] 16 (06/06 0346) BP: (105-147)/(61-86) 105/73 (06/06 0346) SpO2:  [97 %-100 %] 98 % (06/06 0346) Weight:  [69.2 kg] 69.2 kg (06/06 0346) Last BM Date: 10/14/18 General:    white male in NAD Heart:  Regular rate and rhythm; no murmurs Lungs: Respirations even and unlabored, lungs CTA bilaterally Abdomen:  Soft, nontender and nondistended. PEG in place Extremities:  Without edema. Neurologic:  Alert and oriented,  grossly normal neurologically. Psych:  Cooperative. Normal mood and affect.  Intake/Output from previous day: 06/05 0701 - 06/06 0700 In: 1002.5 [P.O.:240; I.V.:427; IV Piggyback:335.5] Out: 2550 [Urine:2550] Intake/Output this shift: Total I/O In: -  Out: 325 [Urine:325]  Lab Results: Recent Labs    10/14/18 0437 10/15/18 0332 10/16/18 0441  WBC 20.3* 12.4* 9.7  HGB 14.7 12.5* 11.4*  HCT 41.7 36.0* 32.9*  PLT 293 216 210   BMET Recent Labs    10/15/18 0332 10/15/18 1928 10/16/18 0441  NA 138 136 138  K 2.8* 3.2* 3.3*  CL 108 108 107  CO2 23 22 24   GLUCOSE 89 141* 122*  BUN 12 10 8   CREATININE 0.53* 0.64 0.69  CALCIUM 7.8* 7.9* 8.0*   LFT Recent Labs    10/15/18 0332  PROT 5.1*  ALBUMIN 2.7*  AST 12*  ALT 19  ALKPHOS 60  BILITOT 0.8  BILIDIR <0.1  IBILI NOT CALCULATED   PT/INR No results for input(s): LABPROT, INR in the last 72 hours.  Studies/Results: No results found.     Assessment / Plan:    53 y/o male with severe esophageal stricture which led to PEG placement in February, had  follow up EGD with Dr. Lysle Rubens at [facility] who performed needle knife therapy with dilation up to 31mm. Came in with partial SBO and vomiting / leukocytosis and developed bleeding. EGD yesterday shows stricture that was able to be traversed with ultraslim scope, severe esophagitis and Mallory weiss tear, the PEG site looked okay. He has not had any further bleeding, tolerating PO, obstruction resolved, WBC normalized.  Recommend: - from bleeding standpoint okay to discharge home, obstruction appears to have resolved, if okay to complete his antibiotic course orally at this point - he should go home on liquid protonix 80mg  BID and liquid carafate 10cc po q 6 hours. I counseled him the carafate will make his stools dark - await pathology from recent EGD - hopefully can repeat an EGD in a few weeks with Savary dilation and slowly dilate stricture over time now that it has been opened up. Our office can coordinate follow up.  Please call with questions / concerns.  [place] Cellar, MD [facility]

## 2018-10-16 NOTE — Progress Notes (Addendum)
Pt given discharge instructions and prescription. Pt verbalized understanding of discharge instructions. All belongings gathered to be sent home.  1:42pm - explained to patient that protonix can be given via PEG tube but not to crush them. Pt verbalized understanding.

## 2018-10-16 NOTE — Plan of Care (Signed)
  Problem: Pain Managment: Goal: General experience of comfort will improve Outcome: Progressing   Problem: Safety: Goal: Ability to remain free from injury will improve Outcome: Progressing   Problem: Skin Integrity: Goal: Risk for impaired skin integrity will decrease Outcome: Progressing   

## 2018-10-16 NOTE — Progress Notes (Signed)
   Subjective: Mr. Conran was examined at bedside today and reports that he is feeling better and tolerated the EGD quite well. No subsequent nausea, vomiting that he reports. He states that GI wanted to discharge him with a PPI. Usually he goes to [facility] and we advised that we will contact Transition of Care prior to discharge. We gave him return precautions if he begin experiencing fevers, chills.   Objective:  Vital signs in last 24 hours: Vitals:   10/15/18 1200 10/15/18 1332 10/15/18 1933 10/16/18 0346  BP: (!) 147/66 124/61 121/72 105/73  Pulse: 65 75 66 61  Resp: 16 18 15 16   Temp:  98.4 F (36.9 C) 98.6 F (37 C) 98.7 F (37.1 C)  TempSrc:  Oral Oral Oral  SpO2: 100% 98% 99% 98%  Weight:    69.2 kg  Height:       General: A/O x4, in no acute distress, afebrile, nondiaphoretic HEENT: PEERL, EMO intact Cardio: RRR, no mrg's  Pulmonary: CTA bilaterally, no wheezing or crackles  Abdomen: Bowel sounds normal, soft, nontender  MSK: BLE nontender, nonedematous Psych: Appropriate affect, not depressed in appearance, engages well  Assessment/Plan:  Principal Problem:   SBO (small bowel obstruction) (HCC) Active Problems:   HTN (hypertension)   Opioid use disorder, moderate, in early remission (HCC)   Esophagitis, erosive   Esophageal stenosis   S/P percutaneous endoscopic gastrostomy (PEG) tube placement (HCC)   Fever, unspecified   Melena   Mallory-Weiss tear  Vincent Clarke is a 53 yo M w/ a PMHx notable for HTN, PAD, high grad esophageal stenosis, 2/2 to opiate use and severe esophagitis s/p PEG placement who presented to the ED w/ two days of progressive epigastric abdominal pain. He was found to have an SBO but after improving from this there was concern for an upper GI bleed with melena and hematemesis. EGD demonstrated a Mallory-Weise tear absent active bleeding but evidence of recent bleed present.   Partial SBO, Resolved: Uncertain etiology. GI panel  pending. C. Diff negative.  -Resuming liquid diet and tube feeding -Discontinue IV fluids  GI Bleed Esophageal Stenosis s/p PEG tube placement: GI bleed determined to most likely be 2/2 to the [employer] tear which is likely partially related to his esophageal stenosis and prior procedures. -No active bleed -High dose PPI 80mg  BID PO solution  -Sucralfate 1g/59ml solution q6 hours  -F/up with GI outpatient for dilation procedure  -liquid diet and tube feeds per patient    Fever:  Resolved. Discontinue IV antibiotics. No source identified.   Opioid Use disorder: Continue suboxone, stable.   Dispo: Anticipated discharge in approximately 0 day(s).   Kathi Ludwig, MD 10/16/2018, 6:52 AM Pager: Pager# [phone]

## 2018-10-17 ENCOUNTER — Encounter (HOSPITAL_COMMUNITY): Payer: Self-pay | Admitting: Internal Medicine

## 2018-10-19 LAB — CULTURE, BLOOD (ROUTINE X 2)
Culture: NO GROWTH
Culture: NO GROWTH
Special Requests: ADEQUATE

## 2018-10-22 ENCOUNTER — Other Ambulatory Visit: Payer: Self-pay

## 2018-10-22 ENCOUNTER — Telehealth: Payer: Self-pay

## 2018-10-22 DIAGNOSIS — K222 Esophageal obstruction: Secondary | ICD-10-CM

## 2018-10-22 NOTE — Telephone Encounter (Signed)
Great thanks so much 

## 2018-10-22 NOTE — Telephone Encounter (Signed)
Patient scheduled for EGD w/dilation @ [facility] 11/03/18 at 12:30pm. COVID order in Epic and appt. For testing 10/30/18 between 9:30am-12:45pm at [place]. Patient notified of quarantine requirements. Pre-visit (phone) 10/27/18 at 11:00am. Patient called and aware of all the above and in agreement

## 2018-10-22 NOTE — Telephone Encounter (Signed)
-----   Message from Yetta Flock, MD sent at 10/22/2018 12:01 PM EDT ----- Regarding: follow up EGD Sherlynn Stalls can you help see if there are any openings for a hospital EGD with dilation on 6/18 in the PM or 6/24 in the PM, for this patient? Thanks much ----- Message ----- From: Yetta Flock, MD Sent: 10/16/2018   8:18 AM EDT To: Yetta Flock, MD Subject: discuss follow up with Sherlynn Stalls

## 2018-10-25 ENCOUNTER — Other Ambulatory Visit: Payer: Self-pay

## 2018-10-25 ENCOUNTER — Emergency Department (HOSPITAL_COMMUNITY)
Admission: EM | Admit: 2018-10-25 | Discharge: 2018-10-25 | Disposition: A | Discharge status: Home / Self Care | Payer: Medicaid Other | Attending: Emergency Medicine | Admitting: Emergency Medicine

## 2018-10-25 ENCOUNTER — Emergency Department (HOSPITAL_COMMUNITY): Payer: Medicaid Other

## 2018-10-25 ENCOUNTER — Encounter (HOSPITAL_COMMUNITY): Payer: Self-pay | Admitting: Emergency Medicine

## 2018-10-25 DIAGNOSIS — K226 Gastro-esophageal laceration-hemorrhage syndrome: Secondary | ICD-10-CM | POA: Insufficient documentation

## 2018-10-25 DIAGNOSIS — R1084 Generalized abdominal pain: Secondary | ICD-10-CM | POA: Diagnosis not present

## 2018-10-25 DIAGNOSIS — F17210 Nicotine dependence, cigarettes, uncomplicated: Secondary | ICD-10-CM | POA: Insufficient documentation

## 2018-10-25 DIAGNOSIS — I10 Essential (primary) hypertension: Secondary | ICD-10-CM | POA: Diagnosis not present

## 2018-10-25 DIAGNOSIS — Z79899 Other long term (current) drug therapy: Secondary | ICD-10-CM | POA: Insufficient documentation

## 2018-10-25 DIAGNOSIS — Z931 Gastrostomy status: Secondary | ICD-10-CM | POA: Diagnosis not present

## 2018-10-25 LAB — CBC WITH DIFFERENTIAL/PLATELET
Abs Immature Granulocytes: 0.06 10*3/uL (ref 0.00–0.07)
Basophils Absolute: 0 10*3/uL (ref 0.0–0.1)
Basophils Relative: 0 %
Eosinophils Absolute: 0.3 10*3/uL (ref 0.0–0.5)
Eosinophils Relative: 2 %
HCT: 38.1 % — ABNORMAL LOW (ref 39.0–52.0)
Hemoglobin: 12.7 g/dL — ABNORMAL LOW (ref 13.0–17.0)
Immature Granulocytes: 1 %
Lymphocytes Relative: 12 %
Lymphs Abs: 1.4 10*3/uL (ref 0.7–4.0)
MCH: 30 pg (ref 26.0–34.0)
MCHC: 33.3 g/dL (ref 30.0–36.0)
MCV: 89.9 fL (ref 80.0–100.0)
Monocytes Absolute: 0.7 10*3/uL (ref 0.1–1.0)
Monocytes Relative: 6 %
Neutro Abs: 9.4 10*3/uL — ABNORMAL HIGH (ref 1.7–7.7)
Neutrophils Relative %: 79 %
Platelets: 406 10*3/uL — ABNORMAL HIGH (ref 150–400)
RBC: 4.24 MIL/uL (ref 4.22–5.81)
RDW: 13.5 % (ref 11.5–15.5)
WBC: 11.9 10*3/uL — ABNORMAL HIGH (ref 4.0–10.5)
nRBC: 0 % (ref 0.0–0.2)

## 2018-10-25 LAB — COMPREHENSIVE METABOLIC PANEL
ALT: 17 U/L (ref 0–44)
AST: 15 U/L (ref 15–41)
Albumin: 3.2 g/dL — ABNORMAL LOW (ref 3.5–5.0)
Alkaline Phosphatase: 81 U/L (ref 38–126)
Anion gap: 7 (ref 5–15)
BUN: 7 mg/dL (ref 6–20)
CO2: 23 mmol/L (ref 22–32)
Calcium: 9.2 mg/dL (ref 8.9–10.3)
Chloride: 110 mmol/L (ref 98–111)
Creatinine, Ser: 0.61 mg/dL (ref 0.61–1.24)
GFR calc Af Amer: >60 mL/min (ref >60)
GFR calc non Af Amer: >60 mL/min (ref >60)
Glucose, Bld: 110 mg/dL — ABNORMAL HIGH (ref 70–99)
Potassium: 3.6 mmol/L (ref 3.5–5.1)
Sodium: 140 mmol/L (ref 135–145)
Total Bilirubin: 0.5 mg/dL (ref 0.3–1.2)
Total Protein: 6.6 g/dL (ref 6.5–8.1)

## 2018-10-25 LAB — LIPASE, BLOOD: Lipase: 24 U/L (ref 11–51)

## 2018-10-25 MED ORDER — SUCRALFATE 1 GM/10ML PO SUSP: 1.0000 g | Freq: Three times a day (TID) | ORAL | 0 refills | Status: DC

## 2018-10-25 MED ORDER — SUCRALFATE 1 GM/10ML PO SUSP
1.0000 g | Freq: Once | ORAL | Status: AC
  Administered 2018-10-25: 1 g
  Filled 2018-10-25: qty 10

## 2018-10-25 MED ORDER — PANTOPRAZOLE SODIUM 40 MG PO PACK
80.0000 mg | PACK | Freq: Every day | ORAL | Status: DC
  Administered 2018-10-25: 80 mg
  Filled 2018-10-25: qty 40

## 2018-10-25 MED ORDER — PANTOPRAZOLE SODIUM 20 MG PO TBEC: 80.0000 mg | DELAYED_RELEASE_TABLET | Freq: Two times a day (BID) | ORAL | 0 refills | Status: DC

## 2018-10-25 MED ORDER — IOHEXOL 300 MG/ML  SOLN
100.0000 mL | Freq: Once | INTRAMUSCULAR | Status: AC | PRN
  Administered 2018-10-25: 100 mL via INTRAVENOUS

## 2018-10-25 NOTE — ED Notes (Signed)
Patient transported to CT 

## 2018-10-25 NOTE — ED Notes (Signed)
Pt requesting pain medication, EDP aware.

## 2018-10-25 NOTE — Discharge Instructions (Signed)
It is important for you to get your medications from the pharmacy.  I have given you a dose of those today.  It is important that you follow-up with your GI doctor as they will best be able to monitor your condition and treat any additional pain.
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
## 2018-10-25 NOTE — TOC Initial Note (Signed)
Transition of Care [facility]) - Initial/Assessment Note    Patient Details  Name: Vincent Clarke MRN: [id number] Date of Birth: 28-Jun-1965  Transition of [facility]) CM/SW Contact:    Erenest Rasher, RN Phone Number: 10/25/2018, 5:03 PM  Clinical Narrative:                 Spoke to pt and states he gets his meds from [facility] and pays out of pocket for meds. He gets his Subaxone from  [facility]. Pt has used [facility] in 06/2018 and will not be able to utilize again until next year. Will have attending send to [facility] and he will be able to pick up tomorrow at discounted rate. Made pt aware. Will follow up tomorrow with the pharmacy. Pt has applied for SS disability and Medicaid. Waiting on decision.   Expected Discharge Plan: Home/Self Care Barriers to Discharge: No Barriers Identified   Patient Goals and CMS Choice        Expected Discharge Plan and Services Expected Discharge Plan: Home/Self Care   Discharge Planning Services: CM Consult, MATCH Program, Medication Assistance                                          Prior Living Arrangements/Services   Lives with:: Spouse Patient language and need for interpreter reviewed:: Yes Do you feel safe going back to the place where you live?: Yes      Need for Family Participation in Patient Care: No (Comment) Care giver support system in place?: No (comment)   Criminal Activity/Legal Involvement Pertinent to Current Situation/Hospitalization: No - Comment as needed  Activities of Daily Living      Permission Sought/Granted Permission sought to share information with : Case Manager, PCP Permission granted to share information with : Yes, Verbal Permission Granted              Emotional Assessment   Attitude/Demeanor/Rapport: Engaged Affect (typically observed): Accepting Orientation: : Oriented to Self, Oriented to Place, Oriented to  Time, Oriented to Situation   Psych  Involvement: No (comment)  Admission diagnosis:  abd pain Patient Active Problem List   Diagnosis Date Noted  . Mallory-Weiss tear   . Fever, unspecified 10/14/2018  . Melena   . SBO (small bowel obstruction) ([place]) 10/12/2018  . S/P percutaneous endoscopic gastrostomy (PEG) tube placement ([place]) 07/06/2018  . Esophageal obstruction   . Esophageal stenosis   . Dysphagia 06/22/2018  . Esophagitis, erosive 06/05/2018  . Opioid use disorder, moderate, in early remission ([place])   . HTN (hypertension) 03/21/2014  . Asthma, chronic 03/21/2014  . GERD (gastroesophageal reflux disease) 03/21/2014  . Tobacco use disorder 03/21/2014   PCP:  [name], MD Pharmacy:   [facility], [place] [address] [place] [address] Phone: [phone] Fax: [place], [place] [address] [facility]. [address] [place] [address] Phone: [phone] Fax: [place], [place] [address] Phone: [phone] Fax: [phone]  [place], [place] [address] [place] [address] Phone: [phone] Fax: [place], [place]  [facility] [address] [facility] [address] [place] [address] Phone: [phone] Fax: [phone]     Social Determinants of Health (SDOH) Interventions    Readmission Risk Interventions No flowsheet data found.  

## 2018-10-25 NOTE — ED Triage Notes (Addendum)
Pt in with c/o abd pain - has feeding tube and has been "having problems with it since he was d/c'd". Denies any n/v/d

## 2018-10-25 NOTE — ED Triage Notes (Signed)
Pt states he is having continued pain around feeding tube site. Pt has tenderness around area on LUQ/ RUQ. Pt DC from hospital last Saturday for same. Pt unable to afford abx prescription.

## 2018-10-26 ENCOUNTER — Telehealth: Payer: Self-pay | Admitting: *Deleted

## 2018-10-26 ENCOUNTER — Other Ambulatory Visit: Payer: Self-pay

## 2018-10-26 MED ORDER — BUPRENORPHINE HCL-NALOXONE HCL 8-2 MG SL SUBL: 1.0000 | SUBLINGUAL_TABLET | Freq: Two times a day (BID) | SUBLINGUAL | 0 refills | Status: DC

## 2018-10-26 MED FILL — BUPRENORPHIN-NALOXON 8-2 MG: 8-2 | 14 days supply | Qty: 28 | Fill #0

## 2018-10-26 NOTE — Telephone Encounter (Signed)
Spoke w/ dr Heber Roosevelt, pt placed on 7/7, pt states he will be present

## 2018-10-26 NOTE — Telephone Encounter (Signed)
Next oud appt face to face is 7/7, is this ok or do you want a telehealth appt at another time?

## 2018-10-26 NOTE — Telephone Encounter (Signed)
Needs appointment rescheduled within the next 2 weeks.   Refill for 2 weeks.  No further refills until seen in person or via telehealth.

## 2018-10-26 NOTE — Telephone Encounter (Signed)
buprenorphine-naloxone (SUBOXONE) 8-2 mg SUBL SL tablet, refill request. Requesting to speak with a nurse about OUD appt.

## 2018-10-27 ENCOUNTER — Ambulatory Visit: Payer: Self-pay | Admitting: *Deleted

## 2018-10-27 ENCOUNTER — Other Ambulatory Visit: Payer: Self-pay

## 2018-10-27 VITALS — Ht 65.0 in | Wt 150.0 lb

## 2018-10-27 DIAGNOSIS — K222 Esophageal obstruction: Secondary | ICD-10-CM

## 2018-10-27 NOTE — Progress Notes (Signed)
No egg or soy allergy known to patient  No issues with past sedation with any surgeries  or procedures, no intubation problems  No diet pills per patient No home 02 use per patient  No blood thinners per patient  Pt denies issues with constipation  No A fib or A flutter  EMMI video sent to pt's e mail  Pt verified name, DOB, address and insurance during PV today. Pt mailed instruction packet to included paper to complete and mail back to [facility] with addressed and stamped envelope, Emmi video, copy of consent form to read and not return, and instructions.PV completed over the phone. Pt encouraged to call with questions or issues We discussed several times the COVID testing sight for Saturday, I gave pt the address and explained location and  also that he has to quarantine after the test Saturday- he verbalized understanding

## 2018-10-28 ENCOUNTER — Telehealth: Payer: Self-pay | Admitting: *Deleted

## 2018-10-28 NOTE — Telephone Encounter (Signed)
Contacted pt to follow up on his medications. They will be $20 at [facility], states he will pick up tomorrow, October 29, 2018. Jonnie Finner RN CCM Case Mgmt phone [phone]

## 2018-10-30 ENCOUNTER — Other Ambulatory Visit (HOSPITAL_COMMUNITY)
Admission: RE | Admit: 2018-10-30 | Discharge: 2018-10-30 | Disposition: A | Discharge status: Home / Self Care | Payer: Medicaid Other | Source: Ambulatory Visit | Attending: Gastroenterology | Admitting: Gastroenterology

## 2018-10-30 DIAGNOSIS — Z1159 Encounter for screening for other viral diseases: Secondary | ICD-10-CM | POA: Insufficient documentation

## 2018-10-31 LAB — SARS CORONAVIRUS 2 (TAT 6-24 HRS): SARS Coronavirus 2: NEGATIVE

## 2018-11-02 ENCOUNTER — Encounter (HOSPITAL_COMMUNITY): Payer: Self-pay | Admitting: Emergency Medicine

## 2018-11-02 ENCOUNTER — Other Ambulatory Visit: Payer: Self-pay

## 2018-11-03 ENCOUNTER — Encounter (HOSPITAL_COMMUNITY): Payer: Self-pay | Admitting: *Deleted

## 2018-11-03 ENCOUNTER — Ambulatory Visit (HOSPITAL_COMMUNITY): Payer: Medicaid Other | Admitting: Anesthesiology

## 2018-11-03 ENCOUNTER — Ambulatory Visit (HOSPITAL_COMMUNITY)
Admission: RE | Admit: 2018-11-03 | Discharge: 2018-11-03 | Disposition: A | Discharge status: Home / Self Care | Payer: Medicaid Other | Attending: Gastroenterology | Admitting: Gastroenterology

## 2018-11-03 ENCOUNTER — Encounter (HOSPITAL_COMMUNITY)
Admission: RE | Disposition: A | Discharge status: Home / Self Care | Payer: Self-pay | Source: Home / Self Care | Attending: Gastroenterology

## 2018-11-03 ENCOUNTER — Ambulatory Visit (HOSPITAL_COMMUNITY): Payer: Medicaid Other

## 2018-11-03 ENCOUNTER — Other Ambulatory Visit: Payer: Self-pay | Admitting: Gastroenterology

## 2018-11-03 DIAGNOSIS — K921 Melena: Secondary | ICD-10-CM | POA: Diagnosis not present

## 2018-11-03 DIAGNOSIS — K219 Gastro-esophageal reflux disease without esophagitis: Secondary | ICD-10-CM

## 2018-11-03 DIAGNOSIS — Z931 Gastrostomy status: Secondary | ICD-10-CM | POA: Diagnosis not present

## 2018-11-03 DIAGNOSIS — K56609 Unspecified intestinal obstruction, unspecified as to partial versus complete obstruction: Secondary | ICD-10-CM | POA: Diagnosis not present

## 2018-11-03 DIAGNOSIS — F17210 Nicotine dependence, cigarettes, uncomplicated: Secondary | ICD-10-CM | POA: Insufficient documentation

## 2018-11-03 DIAGNOSIS — I739 Peripheral vascular disease, unspecified: Secondary | ICD-10-CM | POA: Diagnosis not present

## 2018-11-03 DIAGNOSIS — I10 Essential (primary) hypertension: Secondary | ICD-10-CM | POA: Diagnosis not present

## 2018-11-03 DIAGNOSIS — K222 Esophageal obstruction: Secondary | ICD-10-CM | POA: Diagnosis not present

## 2018-11-03 DIAGNOSIS — Z79899 Other long term (current) drug therapy: Secondary | ICD-10-CM | POA: Insufficient documentation

## 2018-11-03 DIAGNOSIS — K449 Diaphragmatic hernia without obstruction or gangrene: Secondary | ICD-10-CM | POA: Diagnosis not present

## 2018-11-03 DIAGNOSIS — K2270 Barrett's esophagus without dysplasia: Secondary | ICD-10-CM | POA: Insufficient documentation

## 2018-11-03 DIAGNOSIS — K210 Gastro-esophageal reflux disease with esophagitis: Secondary | ICD-10-CM | POA: Diagnosis not present

## 2018-11-03 HISTORY — DX: Reserved for inherently not codable concepts without codable children: IMO0001

## 2018-11-03 HISTORY — PX: SAVORY DILATION: SHX5439

## 2018-11-03 HISTORY — PX: BIOPSY: SHX5522

## 2018-11-03 HISTORY — PX: ESOPHAGOGASTRODUODENOSCOPY (EGD) WITH PROPOFOL: SHX5813

## 2018-11-03 SURGERY — ESOPHAGOGASTRODUODENOSCOPY (EGD) WITH PROPOFOL
Anesthesia: Monitor Anesthesia Care

## 2018-11-03 MED ORDER — PROPOFOL 10 MG/ML IV BOLUS
INTRAVENOUS | Status: DC | PRN
  Administered 2018-11-03: 40 mg via INTRAVENOUS

## 2018-11-03 MED ORDER — SODIUM CHLORIDE 0.9 % IV SOLN: INTRAVENOUS | Status: DC

## 2018-11-03 MED ORDER — PROPOFOL 10 MG/ML IV BOLUS
INTRAVENOUS | Status: AC
  Filled 2018-11-03: qty 60

## 2018-11-03 MED ORDER — LACTATED RINGERS IV SOLN
INTRAVENOUS | Status: DC
  Administered 2018-11-03: 11:00:00 via INTRAVENOUS

## 2018-11-03 MED ORDER — PANTOPRAZOLE SODIUM 20 MG PO TBEC: 80.0000 mg | DELAYED_RELEASE_TABLET | Freq: Two times a day (BID) | ORAL | 6 refills | Status: DC

## 2018-11-03 MED ORDER — LIDOCAINE 2% (20 MG/ML) 5 ML SYRINGE
INTRAMUSCULAR | Status: DC | PRN
  Administered 2018-11-03: 100 mg via INTRAVENOUS

## 2018-11-03 MED ORDER — PROPOFOL 500 MG/50ML IV EMUL
INTRAVENOUS | Status: DC | PRN
  Administered 2018-11-03: 125 ug/kg/min via INTRAVENOUS

## 2018-11-03 SURGICAL SUPPLY — 14 items

## 2018-11-03 NOTE — Anesthesia Postprocedure Evaluation (Signed)
Anesthesia Post Note  Patient: Vincent Clarke  Procedure(s) Performed: ESOPHAGOGASTRODUODENOSCOPY (EGD) WITH PROPOFOL (N/A ) BIOPSY SAVORY DILATION (N/A )     Patient location during evaluation: Endoscopy Anesthesia Type: MAC Level of consciousness: awake Pain management: pain level controlled Vital Signs Assessment: post-procedure vital signs reviewed and stable Respiratory status: spontaneous breathing Cardiovascular status: stable Postop Assessment: no apparent nausea or vomiting Anesthetic complications: no    Last Vitals:  Vitals:   11/03/18 1350 11/03/18 1400  BP: 101/70 101/64  Pulse:    Resp: 14 15  Temp:    SpO2: 94% 92%    Last Pain:  Vitals:   11/03/18 1400  TempSrc:   PainSc: 0-No pain   Pain Goal:                   Huston Foley

## 2018-11-03 NOTE — Interval H&P Note (Signed)
History and Physical Interval Note:  11/03/2018 12:42 PM  Vincent Clarke  has presented today for surgery, with the diagnosis of ESOPHAGEAL STENOIS.  The various methods of treatment have been discussed with the patient and family. After consideration of risks, benefits and other options for treatment, the patient has consented to  Procedure(s): ESOPHAGOGASTRODUODENOSCOPY (EGD) WITH PROPOFOL (N/A) as a surgical intervention.  The patient's history has been reviewed, patient examined, no change in status, stable for surgery.  I have reviewed the patient's chart and labs.  Questions were answered to the patient's satisfaction.     [place]

## 2018-11-03 NOTE — Op Note (Signed)
[facility] Patient Name: Vincent Clarke Procedure Date: 11/03/2018 MRN: [id number] Attending MD: Carlota Raspberry. Havery [name] , MD Date of Birth: [birth year] CSN: [id number] Age: 53 Admit Type: Outpatient Procedure:                Upper GI endoscopy Indications:              severe peptic esophageal stricture, now on high                            dose protonix, s/p kneedle knife opening of                            stricture at [facility], continues to have dysphagia Providers:                Carlota Raspberry. Havery [name], MD, Cleda Daub, RN, Cherylynn Ridges, Technician, Stephanie [place] CRNA Referring MD:              Medicines:                Monitored Anesthesia Care Complications:            No immediate complications. Estimated blood loss:                            Minimal. Estimated Blood Loss:     Estimated blood loss was minimal. Procedure:                Pre-Anesthesia Assessment:                           - Prior to the procedure, a History and Physical                            was performed, and patient medications and                            allergies were reviewed. The patient's tolerance of                            previous anesthesia was also reviewed. The risks                            and benefits of the procedure and the sedation                            options and risks were discussed with the patient.                            All questions were answered, and informed consent                            was obtained. Prior Anticoagulants: The patient has  taken no previous anticoagulant or antiplatelet                            agents. ASA Grade Assessment: II - A patient with                            mild systemic disease. After reviewing the risks                            and benefits, the patient was deemed in                            satisfactory condition to undergo the procedure.               After obtaining informed consent, the endoscope was                            passed under direct vision. Throughout the                            procedure, the patient's blood pressure, pulse, and                            oxygen saturations were monitored continuously. The                            [id number]-[id number] ([id number] Olympus ultra slim endoscope                            was introduced through the mouth, and advanced to                            the second part of duodenum. The upper GI endoscopy                            was accomplished without difficulty. The patient                            tolerated the procedure well. Scope In: Scope Out: Findings:      Esophagogastric landmarks were identified: the gastroesophageal junction       was found at 37 cm and the upper extent of the gastric folds was found       at 39 cm from the incisors.      A 2 cm hiatal hernia was present.      Suspect Barrett's esophagus was present in the lower third of the       esophagus a few cm in length although transition to normal esophagus       tissue not able to be appreciated due to active inflammation / ulcer at       the stricture. Biopsies were taken with a cold forceps for histology.      One benign-appearing, intrinsic stenosis was found 30 to 34 cm from the       incisors with areas of ulceration, although inflammatory changes  considerably improved on high dose PPI compared to the last exam. This       stenosis measured roughly 5-6 mm (inner diameter) x 4 cm (in length).       The stenosis was traversed using ultra-thin upper endoscope. A guidewire       was placed and the scope was withdrawn. Dilation was performed with a       Savary dilator with mild resistance at 6 mm, 7 mm, 8 mm, 9 mm and 10 mm.       Relook endoscopy showed appropriate mucosal wrents of the stricture.      There was a PEG bumper in the gastric body. The entire examined stomach       was  otherwise normal.      The duodenal bulb and second portion of the duodenum were normal. Impression:               - Esophagogastric landmarks identified.                           - 2 cm hiatal hernia.                           - suspected Barrett's esophagus - length unable to                            be appreciated given persistent ulceration /                            esophagitis, although inflammation significantly                            improved from last exam. Biopsied.                           - Severe peptic esophageal stenosis with ulceration                            - improved in appearance from last exam on PPI.                            Dilated as above to 73m with good result.                           - Normal stomach.                           - Normal duodenal bulb and second portion of the                            duodenum. Moderate Sedation:      No moderate sedation, case performed with MAC Recommendation:           - Full liquid diet.                           - Continue present medications (high dose protonix  BID)                           - Await pathology results.                           - Repeat upper endoscopy in 2-3 weeks for                            retreatment and further dilation, our office will                            coordinate this for you Procedure Code(s):        --- Professional ---                           [phone], Esophagogastroduodenoscopy, flexible,                            transoral; with insertion of guide wire followed by                            passage of dilator(s) through esophagus over guide                            wire                           [id number], 59, Esophagogastroduodenoscopy, flexible,                            transoral; with biopsy, single or multiple Diagnosis Code(s):        --- Professional ---                           K22.70, Barrett's esophagus without dysplasia                            K44.9, Diaphragmatic hernia without obstruction or                            gangrene                           K22.2, Esophageal obstruction CPT copyright 2019 American Medical Association. All rights reserved. The codes documented in this report are preliminary and upon coder review may  be revised to meet current compliance requirements. Remo Lipps P. Hema Lanza, MD 11/03/2018 1:31:38 PM This report has been signed electronically. Number of Addenda: 0
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
## 2018-11-03 NOTE — Anesthesia Preprocedure Evaluation (Signed)
Anesthesia Evaluation  Patient identified by MRN, date of birth, ID band Patient awake    Reviewed: Allergy & Precautions, NPO status , Patient's Chart, lab work & pertinent test results  Airway Mallampati: II  TM Distance: >3 FB Neck ROM: Full    Dental  (+) Edentulous Upper, Edentulous Lower   Pulmonary Current Smoker,    Pulmonary exam normal breath sounds clear to auscultation       Cardiovascular Normal cardiovascular exam Rhythm:Regular Rate:Normal     Neuro/Psych    GI/Hepatic Neg liver ROS, GERD  Medicated,  Endo/Other    Renal/GU negative Renal ROS     Musculoskeletal   Abdominal Normal abdominal exam  (+)   Peds  Hematology  (+) Blood dyscrasia, anemia ,   Anesthesia Other Findings   Reproductive/Obstetrics                             Anesthesia Physical  Anesthesia Plan  ASA: II  Anesthesia Plan: MAC   Post-op Pain Management:    Induction:   PONV Risk Score and Plan: Ondansetron and Propofol infusion  Airway Management Planned: Natural Airway, Nasal Cannula and Mask  Additional Equipment:   Intra-op Plan:   Post-operative Plan:   Informed Consent: I have reviewed the patients History and Physical, chart, labs and discussed the procedure including the risks, benefits and alternatives for the proposed anesthesia with the patient or authorized representative who has indicated his/her understanding and acceptance.     Dental advisory given  Plan Discussed with: CRNA  Anesthesia Plan Comments:         Anesthesia Quick Evaluation

## 2018-11-03 NOTE — Transfer of Care (Signed)
Immediate Anesthesia Transfer of Care Note  Patient: Vincent Clarke  Procedure(s) Performed: ESOPHAGOGASTRODUODENOSCOPY (EGD) WITH PROPOFOL (N/A ) BIOPSY SAVORY DILATION (N/A )  Patient Location: PACU and Endoscopy Unit  Anesthesia Type:MAC  Level of Consciousness: awake, alert  and oriented  Airway & Oxygen Therapy: Patient Spontanous Breathing and Patient connected to face mask oxygen  Post-op Assessment: Report given to RN and Post -op Vital signs reviewed and stable  Post vital signs: Reviewed and stable  Last Vitals:  Vitals Value Taken Time  BP 98/53 11/03/18 1332  Temp 36.8 C 11/03/18 1332  Pulse 54 11/03/18 1335  Resp 13 11/03/18 1335  SpO2 93 % 11/03/18 1335  Vitals shown include unvalidated device data.  Last Pain:  Vitals:   11/03/18 1332  TempSrc: Oral  PainSc: 0-No pain         Complications: No apparent anesthesia complications

## 2018-11-04 ENCOUNTER — Encounter (HOSPITAL_COMMUNITY): Payer: Self-pay | Admitting: Gastroenterology

## 2018-11-15 ENCOUNTER — Ambulatory Visit: Payer: Self-pay

## 2018-11-15 ENCOUNTER — Other Ambulatory Visit: Payer: Self-pay

## 2018-11-15 VITALS — Ht 65.0 in | Wt 145.0 lb

## 2018-11-15 DIAGNOSIS — K222 Esophageal obstruction: Secondary | ICD-10-CM

## 2018-11-15 MED FILL — PANTOPRAZOLE SOD DR 20 MG T: 20 | 15 days supply | Qty: 120 | Fill #0

## 2018-11-15 NOTE — Progress Notes (Signed)
No egg or soy allergy known to patient  No issues with past sedation with any surgeries  or procedures, no intubation problems  No diet pills per patient No home 02 use per patient  No blood thinners per patient  Pt denies issues with constipation  No A fib or A flutter  EMMI video sent to pt's e mail , pt declined    

## 2018-11-16 ENCOUNTER — Encounter: Payer: Self-pay | Admitting: Internal Medicine

## 2018-11-16 ENCOUNTER — Ambulatory Visit (INDEPENDENT_AMBULATORY_CARE_PROVIDER_SITE_OTHER): Payer: Medicaid Other | Admitting: Internal Medicine

## 2018-11-16 VITALS — BP 162/92 | HR 93 | Temp 98.9°F | Wt 150.7 lb

## 2018-11-16 DIAGNOSIS — K222 Esophageal obstruction: Secondary | ICD-10-CM | POA: Diagnosis not present

## 2018-11-16 DIAGNOSIS — F1120 Opioid dependence, uncomplicated: Secondary | ICD-10-CM | POA: Diagnosis not present

## 2018-11-16 DIAGNOSIS — F1121 Opioid dependence, in remission: Secondary | ICD-10-CM

## 2018-11-16 MED ORDER — BUPRENORPHINE HCL-NALOXONE HCL 8-2 MG SL SUBL: 1.0000 | SUBLINGUAL_TABLET | Freq: Two times a day (BID) | SUBLINGUAL | 0 refills | Status: DC

## 2018-11-16 MED FILL — BUPRENORPHIN-NALOXON 8-2 MG: 8-2 | 14 days supply | Qty: 28 | Fill #0

## 2018-11-16 NOTE — Patient Instructions (Signed)
Mr. Mirsky,  We will continue your current dosing of Suboxone twice daily.   I hope you get some relief with esophageal dilation on Friday.   We'll see you back for for follow-up in 2 weeks.   Take care! Dr. Koleen Distance

## 2018-11-16 NOTE — Progress Notes (Signed)
   11/16/2018  Vincent Clarke presents for follow up of opioid use disorder I have reviewed the prior induction visit, follow up visits, and telephone encounters relevant to opiate use disorder (OUD) treatment.   Current daily dose: Suboxone 8-2 mg BID  Date of Induction: 05/14/18  Current follow up interval, in weeks: 2 weeks   The patient has been adherent with the buprenorphine for OUD contract.   Last UDS Result: Buprenorphine present, norbupenorphine present, sample was dilute.    HPI: [name] is a 53 y/o gentleman with OUD who presents for treatment with buprenorphine. He has been having a difficult time lately with his dysphagia. Recently underwent EGD with dilation, but has not noticed much improvement. Still has trouble swallowing solids and liquids. Scheduled for repeat EGD on Friday.  He admits he has been having a difficult time coping with this and has had a few relapses with snorting fentanyl, most recently 4 days ago. He endorses consistently taking twice daily Suboxone as well. Denies any side effects.   Exam:   Vitals:   11/16/18 0850  BP: (!) 162/92  Pulse: 93  Temp: 98.9 F (37.2 C)  TempSrc: Oral  SpO2: 99%  Weight: 150 lb 11.2 oz (68.4 kg)   General: appears tired, NAD HEENT: conjunctiva normal; rhinorrhea Pulm: normal work of breathing  Neuro: A&Ox3; answers questions appropriately, no focal deficits Psych: depressed mood with blunted affect   Assessment/Plan:  See Problem Based Charting in the Encounters Tab   Delice Bison, DO  11/16/2018  9:08 AM

## 2018-11-16 NOTE — Assessment & Plan Note (Signed)
Patient endorses consistently taking twice daily Suboxone. Having a difficult time with esophageal stricture and endorses approximately once weekly fentanyl use as well since last being seen. Will check tox screen today. Continue twice daily Suboxone 8 mg. Return for closer follow-up in 2 weeks.

## 2018-11-16 NOTE — Progress Notes (Signed)
Internal Medicine Clinic Attending  I saw and evaluated the patient.  I personally confirmed the key portions of the history and exam documented by Dr. Koleen Distance and I reviewed pertinent patient test results.  The assessment, diagnosis, and plan were formulated together and I agree with the documentation in the resident's note.  Patient not doing very well due to severe esophageal disease under the care of GI and awaiting repeat endoscopy. This is causing chronic stress. Chronic OUD not well controlled as a result. Plan is to continue with suboxone, refilled today for 2 weeks, close follow up with [place] in 2 weeks. We set a goal of no heroin use in the next 2 weeks. Hopefully improvement in his esophageal disease will lessen the stress burden on him. May encourage him to work with Dessie Coma at our next visit, CBT may help improve his coping mechanisms.

## 2018-11-18 ENCOUNTER — Telehealth: Payer: Self-pay | Admitting: Gastroenterology

## 2018-11-18 NOTE — Telephone Encounter (Signed)

## 2018-11-19 ENCOUNTER — Ambulatory Visit (AMBULATORY_SURGERY_CENTER): Payer: Medicaid Other | Admitting: Gastroenterology

## 2018-11-19 ENCOUNTER — Other Ambulatory Visit: Payer: Self-pay

## 2018-11-19 ENCOUNTER — Encounter: Payer: Self-pay | Admitting: Gastroenterology

## 2018-11-19 ENCOUNTER — Telehealth: Payer: Self-pay | Admitting: Gastroenterology

## 2018-11-19 VITALS — BP 115/68 | HR 62 | Temp 98.5°F | Resp 18 | Ht 65.0 in | Wt 150.0 lb

## 2018-11-19 DIAGNOSIS — R1310 Dysphagia, unspecified: Secondary | ICD-10-CM | POA: Diagnosis not present

## 2018-11-19 DIAGNOSIS — L03311 Cellulitis of abdominal wall: Secondary | ICD-10-CM

## 2018-11-19 DIAGNOSIS — K222 Esophageal obstruction: Secondary | ICD-10-CM | POA: Diagnosis not present

## 2018-11-19 MED ORDER — SODIUM CHLORIDE 0.9 % IV SOLN: 500.0000 mL | Freq: Once | INTRAVENOUS | Status: DC

## 2018-11-19 MED ORDER — DICLOXACILLIN SODIUM 500 MG PO CAPS: 500.0000 mg | ORAL_CAPSULE | Freq: Four times a day (QID) | ORAL | 0 refills | Status: DC

## 2018-11-19 MED FILL — DICLOXACILLIN 500 MG CAP: 500 | 7 days supply | Qty: 28 | Fill #0

## 2018-11-19 NOTE — Progress Notes (Signed)
Called to room to assist during endoscopic procedure.  Patient ID and intended procedure confirmed with present staff. Received instructions for my participation in the procedure from the performing physician.  

## 2018-11-19 NOTE — Patient Instructions (Signed)
Liquid diet only. Continue present medications ( protonix and carafate ). Repeat EGD in two weeks with another dilation ( scheduled July 27 at 1:00 pm, arrive at 12:00 pm ). Start Dicloxacillin 500 mg by mouth four times daily for one week.     YOU HAD AN ENDOSCOPIC PROCEDURE TODAY AT [facility]:   Refer to the procedure report that was given to you for any specific questions about what was found during the examination.  If the procedure report does not answer your questions, please call your gastroenterologist to clarify.  If you requested that your care partner not be given the details of your procedure findings, then the procedure report has been included in a sealed envelope for you to review at your convenience later.  YOU SHOULD EXPECT: Some feelings of bloating in the abdomen. Passage of more gas than usual.  Walking can help get rid of the air that was put into your GI tract during the procedure and reduce the bloating. If you had a lower endoscopy (such as a colonoscopy or flexible sigmoidoscopy) you may notice spotting of blood in your stool or on the toilet paper. If you underwent a bowel prep for your procedure, you may not have a normal bowel movement for a few days.  Please Note:  You might notice some irritation and congestion in your nose or some drainage.  This is from the oxygen used during your procedure.  There is no need for concern and it should clear up in a day or so.  SYMPTOMS TO REPORT IMMEDIATELY:     Following upper endoscopy (EGD)  Vomiting of blood or coffee ground material  New chest pain or pain under the shoulder blades  Painful or persistently difficult swallowing  New shortness of breath  Fever of 100F or higher  Black, tarry-looking stools  For urgent or emergent issues, a gastroenterologist can be reached at any hour by calling [phone].   Diet  Drink plenty of fluids but you should avoid alcoholic beverages for 24  hours.  ACTIVITY:  You should plan to take it easy for the rest of today and you should NOT DRIVE or use heavy machinery until tomorrow (because of the sedation medicines used during the test).    FOLLOW UP: Our staff will call the number listed on your records 48-72 hours following your procedure to check on you and address any questions or concerns that you may have regarding the information given to you following your procedure. If we do not reach you, we will leave a message.  We will attempt to reach you two times.  During this call, we will ask if you have developed any symptoms of COVID 19. If you develop any symptoms (ie: fever, flu-like symptoms, shortness of breath, cough etc.) before then, please call [phone].  If you test positive for Covid 19 in the 2 weeks post procedure, please call and report this information to [place].    If any biopsies were taken you will be contacted by phone or by letter within the next 1-3 weeks.  Please call us at [phone] if you have not heard about the biopsies in 3 weeks.    SIGNATURES/CONFIDENTIALITY: You and/or your care partner have signed paperwork which will be entered into your electronic medical record.  These signatures attest to the fact that that the information above on your After Visit Summary has been reviewed and is understood.  Full responsibility of the confidentiality of this  discharge information lies with you and/or your care-partner. 

## 2018-11-19 NOTE — Progress Notes (Signed)
A and O x3. Report to RN. Tolerated MAC anesthesia well.Gums unchanged after procedure.

## 2018-11-19 NOTE — Op Note (Signed)
[facility] Patient Name: Vincent Clarke Procedure Date: 11/19/2018 1:33 PM MRN: [id number] Endoscopist: Remo Lipps P. 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 On high dose protonix 80mg  BID and carafate.                            PED in place for nutrition. Appears to have a                            cellulitis at bumper site today. Medicines:                Monitored Anesthesia Care Procedure:                Pre-Anesthesia Assessment:                           - Prior to the procedure, a History and Physical                            was performed, and patient medications and                            allergies were reviewed. The patient's tolerance of                            previous anesthesia was also reviewed. The risks                            and benefits of the procedure and the sedation                            options and risks were discussed with the patient.                            All questions were answered, and informed consent                            was obtained. Prior Anticoagulants: The patient has                            taken no previous anticoagulant or antiplatelet  agents. ASA Grade Assessment: II - A patient with                            mild systemic disease. After reviewing the risks                            and benefits, the patient was deemed in                            satisfactory condition to undergo the procedure.                           After obtaining informed consent, the endoscope was                            passed under direct vision.  Throughout the                            procedure, the patient's blood pressure, pulse, and                            oxygen saturations were monitored continuously. The                            Endoscope was introduced through the mouth, and                            advanced to the lower third of esophagus. The upper                            GI endoscopy was accomplished without difficulty.                            The patient tolerated the procedure well. Scope In: Scope Out: Findings:                 One benign-appearing, intrinsic severe stenosis was                            found 30 cm from the incisors with some food                            remnants. The stenosis could not be traversed and                            strictured down again, lumen < 5mm in diameter. A                            guidewire was placed and the scope was withdrawn.                            Dilation was performed with a Savary dilator with  mild resistance at 8 mm and 9 mm. Relook endoscopy                            showed appropriate mucosal wrents at the site but                            the stricture is 4cm long from prior measuresments,                            fibrotic and could not pass upper endoscope.                           The exam of the esophagus was otherwise normal. Complications:            No immediate complications. Estimated blood loss:                            Minimal. Estimated Blood Loss:     Estimated blood loss was minimal. Impression:               - Benign-appearing esophageal stenosis, this has                            stricture down considerably in the past few weeks.                            Could not pass endoscope. Dilated to 9mm with good                            result.                           - Patient appears to have a developing cellulitis                            at the PEG bumper site. Recommendation:            - Patient has a contact number available for                            emergencies. The signs and symptoms of potential                            delayed complications were discussed with the                            patient. Return to normal activities tomorrow.                            Written discharge instructions were provided to the                            patient.                           -  Liquids only                           - Continue present medications (protonix and                            carafate)                           - Repeat EGD in 2 weeks with another dilation                           - Follow up with [facility] Dr. Edyth Gunnels in case further                            advanced therapy is required. I'm concerned about                            the significant worsening in short period of time                            since last dilation                           - Start Dicloxacillin  PO four times daily for                            one week for presumed cellulitis around PEG bumper.                            If worsening symptoms despite this regimen, fevers,                            etc, need to seek further care for management                            (would need broadening to cover MRSA). [name], MD 11/19/2018 2:04:44 PM This report has been signed electronically.

## 2018-11-19 NOTE — Telephone Encounter (Signed)
Patient stated that medication was high he need something less expensive

## 2018-11-19 NOTE — Progress Notes (Signed)
Temperature taken by Nancy Campbell, LPN, VS taken by Courtney Washington, CMA 

## 2018-11-22 ENCOUNTER — Telehealth: Payer: Self-pay | Admitting: Internal Medicine

## 2018-11-22 NOTE — Telephone Encounter (Signed)
Unfortunately the discount at the OP pharmacy to get the suboxone to $25 is the most financial help we can offer. I don't know of any coupon or Good Rx offering that will make it cheaper than that. Their is no longer any pharmaceutical program as there was in years past.

## 2018-11-22 NOTE — Telephone Encounter (Signed)
Returned call to patient. States he had surgery last Friday and also has an infection in his G-tube. States the antibiotics cost a lot of money so now he is unable to afford his suboxone and wonders if we can help him. Cost of suboxone at [facility] is $25. Hubbard Hartshorn, RN, BSN

## 2018-11-22 NOTE — Telephone Encounter (Signed)
Called pharmacy and antibiotic we sent is going to be over $40.  Looked at [facility] and it is about 1/2 the cost.  I called the pt and he will get the GoodRx app and let me know which pharmacy is most convenient for him so we can send the script to. Pt expressed understanding.

## 2018-11-22 NOTE — Telephone Encounter (Signed)
Patient made aware of info below. L. Octa Uplinger, RN, BSN   

## 2018-11-22 NOTE — Telephone Encounter (Signed)
Pt is requesting a call back from nurse [phone]

## 2018-11-23 ENCOUNTER — Telehealth: Payer: Self-pay | Admitting: *Deleted

## 2018-11-23 NOTE — Telephone Encounter (Signed)
1. Have you developed a fever since your procedure? no  2.   Have you had an respiratory symptoms (SOB or cough) since your procedure? no  3.   Have you tested positive for COVID 19 since your procedure no  4.   Have you had any family members/close contacts diagnosed with the COVID 19 since your procedure?  no   If yes to any of these questions please route to Joylene John, RN and Alphonsa Gin, RN. , Follow up Call-  Call back number 11/19/2018  Post procedure Call Back phone  # [phone]  Permission to leave phone message Yes  Some recent data might be hidden     Patient questions:  Do you have a fever, pain , or abdominal swelling? No. Pain Score  0 *  Have you tolerated food without any problems? Yes.    Have you been able to return to your normal activities? Yes.    Do you have any questions about your discharge instructions: Diet   No. Medications  No. Follow up visit  No.  Do you have questions or concerns about your Care? No.  Actions: * If pain score is 4 or above: No action needed, pain <4. Pt says he remains on restricted diet as ordered

## 2018-11-24 LAB — TOXASSURE SELECT,+ANTIDEPR,UR

## 2018-11-30 ENCOUNTER — Ambulatory Visit (INDEPENDENT_AMBULATORY_CARE_PROVIDER_SITE_OTHER): Payer: Self-pay | Admitting: Internal Medicine

## 2018-11-30 ENCOUNTER — Encounter: Payer: Self-pay | Admitting: Internal Medicine

## 2018-11-30 ENCOUNTER — Other Ambulatory Visit: Payer: Self-pay

## 2018-11-30 DIAGNOSIS — R1310 Dysphagia, unspecified: Secondary | ICD-10-CM

## 2018-11-30 DIAGNOSIS — F1121 Opioid dependence, in remission: Secondary | ICD-10-CM

## 2018-11-30 DIAGNOSIS — F1120 Opioid dependence, uncomplicated: Secondary | ICD-10-CM

## 2018-11-30 DIAGNOSIS — Z931 Gastrostomy status: Secondary | ICD-10-CM

## 2018-11-30 MED ORDER — BUPRENORPHINE HCL-NALOXONE HCL 8-2 MG SL SUBL: 1.0000 | SUBLINGUAL_TABLET | Freq: Two times a day (BID) | SUBLINGUAL | 0 refills | Status: DC

## 2018-11-30 MED FILL — BUPRENORPHIN-NALOXON 8-2 MG: 8-2 | 14 days supply | Qty: 28 | Fill #0

## 2018-11-30 NOTE — Progress Notes (Signed)
   11/30/2018  Vincent Clarke presents for follow up of opioid use disorder I have reviewed the prior induction visit, follow up visits, and telephone encounters relevant to opiate use disorder (OUD) treatment.   Current daily dose: Suboxone 8-2 mg BID  Date of Induction: 05/14/18  Current follow up interval, in weeks: 2 weeks   The patient has not been adherent with the buprenorphine for OUD contract.   Last UDS Result: buprenorphine with metabolite, positive for methamphetamine and fentanyl   HPI: Vincent Clarke is a 53 y/o gentleman with OUD who presents for treatment with buprenorphine. Since last visit, he continues to struggle with relapses due to the stress of his current health issues with dysphagia. He continues to follow with GI and undergoes EGD with dilation every 2-3 weeks. He has made some progress and is now able to swallow most liquids and few soft foods. He is still using PEG tube to help supplement nutrition. He endorses illicit substance use with fentanyl intranasally approximately 4x per week, most recently 2 days ago.    Exam:   Vitals:   11/30/18 0859  BP: (!) 141/74  Pulse: 76  Temp: 98.8 F (37.1 C)  TempSrc: Oral  SpO2: 98%  Weight: 154 lb 1.6 oz (69.9 kg)   General: alert, appears stated age, in NAD CV: RRR; no m/r/g Pulm: normal work of breathing; lungs CTAB Abd: PEG site clean, dry and intact. No surrounding erythema or edema. Abdomen is soft, non-distended, non-tender Psych: pleasant, interactive, normal mood   Assessment/Plan:  See Problem Based Charting in the Encounters Tab   Delice Bison, DO  11/30/2018  11:35 AM

## 2018-11-30 NOTE — Patient Instructions (Addendum)
Mr. Gilbert, It was good seeing you again. I am hopeful that your swallowing will continue to improve and things will get better overall.  We will continue your Suboxone dosing as prescribed.  We also discussed our behavioral health services for you to consider in the future.   We'll give you a call in 2 weeks to see how things are going.   Take care, Dr. Koleen Distance

## 2018-11-30 NOTE — Assessment & Plan Note (Addendum)
Patient continues to struggle with illicit substance use in addition to consistently taking twice daily suboxone. Uses fentanyl intranasally approximately 4x weekly. Will hopefully stabilize on suboxone once stress surrounding current issues with dysphagia improves.  Continue current Suboxone 8 mg BID.  Will need continued close follow-up with telehealth visit in 2 weeks

## 2018-12-03 ENCOUNTER — Telehealth: Payer: Self-pay | Admitting: Gastroenterology

## 2018-12-03 NOTE — Progress Notes (Signed)
Internal Medicine Clinic Attending  I saw and evaluated the patient.  I personally confirmed the key portions of the history and exam documented by Dr. Bloomfield and I reviewed pertinent patient test results.  The assessment, diagnosis, and plan were formulated together and I agree with the documentation in the resident's note.  

## 2018-12-03 NOTE — Telephone Encounter (Signed)

## 2018-12-06 ENCOUNTER — Other Ambulatory Visit: Payer: Self-pay

## 2018-12-06 ENCOUNTER — Encounter: Payer: Self-pay | Admitting: Gastroenterology

## 2018-12-06 ENCOUNTER — Ambulatory Visit (AMBULATORY_SURGERY_CENTER): Payer: Medicaid Other | Admitting: Gastroenterology

## 2018-12-06 VITALS — BP 112/74 | HR 56 | Temp 98.6°F | Resp 16 | Ht 65.0 in | Wt 145.0 lb

## 2018-12-06 DIAGNOSIS — K222 Esophageal obstruction: Secondary | ICD-10-CM | POA: Diagnosis not present

## 2018-12-06 DIAGNOSIS — R1310 Dysphagia, unspecified: Secondary | ICD-10-CM | POA: Diagnosis not present

## 2018-12-06 MED ORDER — SODIUM CHLORIDE 0.9 % IV SOLN: 500.0000 mL | Freq: Once | INTRAVENOUS | Status: DC

## 2018-12-06 NOTE — Progress Notes (Signed)
Called to room to assist during endoscopic procedure.  Patient ID and intended procedure confirmed with present staff. Received instructions for my participation in the procedure from the performing physician.  

## 2018-12-06 NOTE — Progress Notes (Signed)
PT taken to PACU. Monitors in place. VSS. Report given to RN. 

## 2018-12-06 NOTE — Progress Notes (Signed)
Pt's states no medical or surgical changes since previsit or office visit. 

## 2018-12-06 NOTE — Progress Notes (Signed)
JM- Temp CW- vitals 

## 2018-12-06 NOTE — Op Note (Signed)
[facility] Patient Name: Vincent Clarke Procedure Date: 12/06/2018 1:06 PM MRN: [id number] Endoscopist: Viviann SpareSteven P. Adela LankArmbruster , MD Age: [age] Referring MD:  Date of Birth: 07/22/1965 Gender: Male Account #: [id number] Procedure:                Upper GI endoscopy Indications:              For therapy of esophageal stricture - severe peptic                            stricture 4cm in length, s/p kneedle knife therapy                            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 The patient's tolerance of                            previous anesthesia was also reviewed. The risks                            and benefits of the procedure and the sedation                            options and risks were discussed with the patient.                            All questions were answered, and informed consent                            was obtained. Prior Anticoagulants: The patient has  taken no previous anticoagulant or antiplatelet                            agents. ASA Grade Assessment: II - A patient with                            mild systemic disease. After reviewing the risks                            and benefits, the patient was deemed in                            satisfactory condition to undergo the procedure.                           After obtaining informed consent,  the endoscope was                            passed under direct vision. Throughout the                            procedure, the patient's blood pressure, pulse, and                            oxygen saturations were monitored continuously. The                            Endoscope was introduced through the mouth, and                            advanced to the lower third of esophagus. The upper                            GI endoscopy was accomplished without difficulty.                            The patient tolerated the procedure well. Scope In: Scope Out: Findings:                 One benign-appearing, intrinsic severe stenosis was                            found 30 cm from the incisors, significant                            fibrosis, could not be traversed with regular EGD                            scope. A guidewire was placed and the scope was                            withdrawn. Dilation was performed with a Savary  dilator with mild resistance at 8 mm, 9 mm, and 10                            mm. Relook endoscopy showed multiple wrents, but                            given length of stricture and fibrosis, passage of                            regular endoscope again not possible. No further                            dilation performed given multiple wrents noted.                           The exam of the proximal esophagus was otherwise                            normal. Complications:            No immediate complications. Estimated blood loss:                            Minimal. Estimated Blood Loss:     Estimated blood loss was minimal. Impression:               - Benign-appearing esophageal stenosis. Dilated to                            42mm as above. Recommendation:           - Patient has a contact number available for                            emergencies. The signs and symptoms of potential                            delayed  complications were discussed with the                            patient. Return to normal activities tomorrow.                            Written discharge instructions were provided to the                            patient.                           - Full liquid diet, no solids                           - Continue present medications.                           - Repeat upper endoscopy in 2 weeks for repeat  dilation. Will try to order Kenolog for injection                            with next dilation                           - Would make a follow up appointment [facility] Baron                            [facility] if we cannot make progress                            with continued Savory dilations Mackynzie Woolford P. Adela LankArmbruster, MD 12/06/2018 1:26:25 PM This report has been signed electronically.

## 2018-12-06 NOTE — Patient Instructions (Addendum)
Thank you for allowing [place] to participate in your care today!  Full liquid diet, no solids.    Repeat upper endoscopy in 2 weeks for repeat dilation.   August 13 @ 2pm.  Please arrive at 1 pm.  No eating after midnight on 12/22/18.   May continue clear liquids until 10 AM on 12/23/18.     YOU HAD AN ENDOSCOPIC PROCEDURE TODAY AT [facility]:   Refer to the procedure report that was given to you for any specific questions about what was found during the examination.  If the procedure report does not answer your questions, please call your gastroenterologist to clarify.  If you requested that your care partner not be given the details of your procedure findings, then the procedure report has been included in a sealed envelope for you to review at your convenience later.  YOU SHOULD EXPECT: Some feelings of bloating in the abdomen. Passage of more gas than usual.  Walking can help get rid of the air that was put into your GI tract during the procedure and reduce the bloating. If you had a lower endoscopy (such as a colonoscopy or flexible sigmoidoscopy) you may notice spotting of blood in your stool or on the toilet paper. If you underwent a bowel prep for your procedure, you may not have a normal bowel movement for a few days.  Please Note:  You might notice some irritation and congestion in your nose or some drainage.  This is from the oxygen used during your procedure.  There is no need for concern and it should clear up in a day or so.  SYMPTOMS TO REPORT IMMEDIATELY:     Following upper endoscopy (EGD)  Vomiting of blood or coffee ground material  New chest pain or pain under the shoulder blades  Painful or persistently difficult swallowing  New shortness of breath  Fever of 100F or higher  Black, tarry-looking stools  For urgent or emergent issues, a gastroenterologist can be reached at any hour by calling [phone].   DIET:  We do recommend a small meal at  first, but then you may proceed to your regular diet.  Drink plenty of fluids but you should avoid alcoholic beverages for 24 hours.  ACTIVITY:  You should plan to take it easy for the rest of today and you should NOT DRIVE or use heavy machinery until tomorrow (because of the sedation medicines used during the test).    FOLLOW UP: Our staff will call the number listed on your records 48-72 hours following your procedure to check on you and address any questions or concerns that you may have regarding the information given to you following your procedure. If we do not reach you, we will leave a message.  We will attempt to reach you two times.  During this call, we will ask if you have developed any symptoms of COVID 19. If you develop any symptoms (ie: fever, flu-like symptoms, shortness of breath, cough etc.) before then, please call [phone].  If you test positive for Covid 19 in the 2 weeks post procedure, please call and report this information to [place].    If any biopsies were taken you will be contacted by phone or by letter within the next 1-3 weeks.  Please call us at [phone] if you have not heard about the biopsies in 3 weeks.    SIGNATURES/CONFIDENTIALITY: You and/or your care partner have signed paperwork which will be entered into your  electronic medical record.  These signatures attest to the fact that that the information above on your After Visit Summary has been reviewed and is understood.  Full responsibility of the confidentiality of this discharge information lies with you and/or your care-partner.

## 2018-12-07 ENCOUNTER — Telehealth: Payer: Self-pay | Admitting: Gastroenterology

## 2018-12-07 ENCOUNTER — Encounter (HOSPITAL_COMMUNITY): Payer: Self-pay | Admitting: Emergency Medicine

## 2018-12-07 ENCOUNTER — Emergency Department (HOSPITAL_COMMUNITY)
Admission: EM | Admit: 2018-12-07 | Discharge: 2018-12-08 | Disposition: A | Discharge status: Home / Self Care | Payer: Medicaid Other | Attending: Emergency Medicine | Admitting: Emergency Medicine

## 2018-12-07 ENCOUNTER — Other Ambulatory Visit: Payer: Self-pay

## 2018-12-07 DIAGNOSIS — I10 Essential (primary) hypertension: Secondary | ICD-10-CM | POA: Diagnosis not present

## 2018-12-07 DIAGNOSIS — T85528A Displacement of other gastrointestinal prosthetic devices, implants and grafts, initial encounter: Secondary | ICD-10-CM

## 2018-12-07 DIAGNOSIS — K9423 Gastrostomy malfunction: Secondary | ICD-10-CM | POA: Diagnosis not present

## 2018-12-07 DIAGNOSIS — F17210 Nicotine dependence, cigarettes, uncomplicated: Secondary | ICD-10-CM | POA: Diagnosis not present

## 2018-12-07 DIAGNOSIS — Z03818 Encounter for observation for suspected exposure to other biological agents ruled out: Secondary | ICD-10-CM | POA: Diagnosis not present

## 2018-12-07 DIAGNOSIS — Z20828 Contact with and (suspected) exposure to other viral communicable diseases: Secondary | ICD-10-CM | POA: Diagnosis not present

## 2018-12-07 DIAGNOSIS — J45909 Unspecified asthma, uncomplicated: Secondary | ICD-10-CM | POA: Diagnosis not present

## 2018-12-07 NOTE — Telephone Encounter (Signed)
He called tonight from the ER waiting room.  His radiologically placed G-tube (06/2018) was inadvertently dislodged this evening while he was in the bath.  I explained that he was in the best place and recommended he stay in the ER for feeding tube replacement.

## 2018-12-07 NOTE — ED Triage Notes (Signed)
Pt reports his g-tube fell out today in the shower. NAD.

## 2018-12-08 ENCOUNTER — Telehealth: Payer: Self-pay

## 2018-12-08 LAB — SARS CORONAVIRUS 2 BY RT PCR (HOSPITAL ORDER, PERFORMED IN ~~LOC~~ HOSPITAL LAB): SARS Coronavirus 2: NEGATIVE

## 2018-12-08 NOTE — Telephone Encounter (Signed)
  Follow up Call-  Call back number 12/06/2018 11/19/2018  Post procedure Call Back phone  # [phone]  Permission to leave phone message Yes Yes  Some recent data might be hidden     Patient questions:  Do you have a fever, pain , or abdominal swelling? No. Pain Score  0 *  Have you tolerated food without any problems? Yes.    Have you been able to return to your normal activities? No.  Do you have any questions about your discharge instructions: Diet   No. Medications  No. Follow up visit  No.  Do you have questions or concerns about your Care? Patient pulled out feeding tube went to ER, no complaints of pain.   Actions: * If pain score is 4 or above: No action needed, pain <4. 1. Have you developed a fever since your procedure? no  2.   Have you had an respiratory symptoms (SOB or cough) since your procedure? no  3.   Have you tested positive for COVID 19 since your procedure no  4.   Have you had any family members/close contacts diagnosed with the COVID 19 since your procedure?  no   If yes to any of these questions please route to Joylene John, RN and Alphonsa Gin, Therapist, sports.

## 2018-12-08 NOTE — Discharge Instructions (Addendum)
You were seen today for dislodged G-tube.  You will need to schedule replacements with interventional radiology.  Call the number provided to schedule replacement.
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
## 2018-12-09 ENCOUNTER — Other Ambulatory Visit (HOSPITAL_COMMUNITY): Payer: Self-pay | Admitting: Interventional Radiology

## 2018-12-09 ENCOUNTER — Encounter (HOSPITAL_COMMUNITY): Payer: Self-pay | Admitting: Interventional Radiology

## 2018-12-09 ENCOUNTER — Ambulatory Visit (HOSPITAL_COMMUNITY)
Admission: RE | Admit: 2018-12-09 | Discharge: 2018-12-09 | Disposition: A | Discharge status: Home / Self Care | Payer: Medicaid Other | Source: Ambulatory Visit | Attending: Interventional Radiology | Admitting: Interventional Radiology

## 2018-12-09 ENCOUNTER — Other Ambulatory Visit: Payer: Self-pay

## 2018-12-09 DIAGNOSIS — K9423 Gastrostomy malfunction: Secondary | ICD-10-CM | POA: Insufficient documentation

## 2018-12-09 DIAGNOSIS — K9429 Other complications of gastrostomy: Secondary | ICD-10-CM | POA: Diagnosis not present

## 2018-12-09 DIAGNOSIS — Z431 Encounter for attention to gastrostomy: Secondary | ICD-10-CM

## 2018-12-09 HISTORY — PX: IR REPLC GASTRO/COLONIC TUBE PERCUT W/FLUORO: IMG2333

## 2018-12-09 MED ORDER — LIDOCAINE VISCOUS HCL 2 % MT SOLN
OROMUCOSAL | Status: AC
  Administered 2018-12-09: 10 mL
  Filled 2018-12-09: qty 15

## 2018-12-09 MED ORDER — IOHEXOL 300 MG/ML  SOLN
50.0000 mL | Freq: Once | INTRAMUSCULAR | Status: AC | PRN
  Administered 2018-12-09: 10 mL

## 2018-12-09 MED ORDER — LIDOCAINE HCL 1 % IJ SOLN
INTRAMUSCULAR | Status: AC
  Administered 2018-12-09: 10 mL
  Filled 2018-12-09: qty 20

## 2018-12-09 MED ORDER — LIDOCAINE VISCOUS HCL 2 % MT SOLN
OROMUCOSAL | Status: AC
  Administered 2018-12-09: 5 mL
  Filled 2018-12-09: qty 15

## 2018-12-14 ENCOUNTER — Telehealth: Payer: Self-pay | Admitting: Gastroenterology

## 2018-12-14 ENCOUNTER — Other Ambulatory Visit: Payer: Self-pay

## 2018-12-14 ENCOUNTER — Ambulatory Visit (INDEPENDENT_AMBULATORY_CARE_PROVIDER_SITE_OTHER): Payer: Medicaid Other | Admitting: Student in an Organized Health Care Education/Training Program

## 2018-12-14 DIAGNOSIS — F1121 Opioid dependence, in remission: Secondary | ICD-10-CM | POA: Diagnosis not present

## 2018-12-14 MED ORDER — BUPRENORPHINE HCL-NALOXONE HCL 8-2 MG SL SUBL: 1.0000 | SUBLINGUAL_TABLET | Freq: Two times a day (BID) | SUBLINGUAL | 0 refills | Status: DC

## 2018-12-14 MED FILL — BUPRENORPHIN-NALOXON 8-2 MG: 8-2 | 14 days supply | Qty: 28 | Fill #0

## 2018-12-14 MED FILL — PANTOPRAZOLE SOD DR 40 MG T: 40 | 15 days supply | Qty: 60 | Fill #0

## 2018-12-14 NOTE — Telephone Encounter (Signed)
Thank you :)

## 2018-12-14 NOTE — Telephone Encounter (Signed)
buprenorphine-naloxone (SUBOXONE) 8-2 mg SUBL SL tablet   Refill request @  [facility] - [place], [place] - [address] [phone] (Phone) [phone] (Fax)    

## 2018-12-14 NOTE — Telephone Encounter (Signed)
Return call made to patient-no answer, HIPPA compliant message left on recorder. Pt had tele health visit today and suboxone was refilled.  Pt will also need to follow up in 2wks.Regenia Skeeter, Amerigo Mcglory Cassady8/4/202010:12 AM
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
## 2018-12-14 NOTE — Assessment & Plan Note (Signed)
Opioid use disorder is stable but not well controlled.  One relapse about a week ago.  This is complicated by his esophageal disease which causes frequent nausea and vomiting and his difficulties with G-tube feedings.  Thankfully he still reports being able to absorb the Suboxone well sublingually, does not lose the medicine to vomiting.  Reports his cravings are well controlled, denies any withdrawal symptoms.  I think he is going to continue to be at high risk for intermittent relapses as long as he has these flares of pain and disability.  We talked about positive coping mechanisms, we talked about having awareness of these high risk times.  Plan is to continue with Suboxone 8 mg twice a day.  We talked about pending Medicaid approval, he will have more options for stable Suboxone use once that is approved.  Plan is for follow-up with [place] in 2 weeks, we will get a tox assure at that time.  Set a goal of no relapses over the next 2 weeks.

## 2018-12-14 NOTE — Telephone Encounter (Signed)
Called [facility].  Informed pharmacist that it is ok to change to 40 mg tablets but that Dr. Havery [name] intended for pt to be on 80mg  BID.  If pt is taking only 40mg  BID and it is managing his symptoms he should let us know. For the meantime they will fill script as written by Dr. Havery [name] but with 40mg  tablets:  take 2 tablets (80mg ) twice a day.

## 2018-12-15 ENCOUNTER — Other Ambulatory Visit: Payer: Self-pay

## 2018-12-15 MED ORDER — PANTOPRAZOLE SODIUM 40 MG PO TBEC: 80.0000 mg | DELAYED_RELEASE_TABLET | Freq: Two times a day (BID) | ORAL | 2 refills | Status: DC

## 2018-12-15 NOTE — Telephone Encounter (Signed)
We can take care of this, thanks. ESther can you refill protonix as outlined, 80mg  BID, high dose to treat refractory stricture. Thanks

## 2018-12-21 ENCOUNTER — Encounter: Payer: Self-pay | Admitting: Gastroenterology

## 2018-12-22 ENCOUNTER — Telehealth: Payer: Self-pay | Admitting: Gastroenterology

## 2018-12-22 NOTE — Telephone Encounter (Signed)

## 2018-12-23 ENCOUNTER — Encounter: Payer: Self-pay | Admitting: Gastroenterology

## 2018-12-23 ENCOUNTER — Other Ambulatory Visit: Payer: Self-pay

## 2018-12-23 ENCOUNTER — Ambulatory Visit (AMBULATORY_SURGERY_CENTER): Payer: Medicaid Other | Admitting: Gastroenterology

## 2018-12-23 VITALS — BP 129/75 | HR 57 | Temp 96.0°F | Resp 17 | Ht 65.0 in | Wt 145.0 lb

## 2018-12-23 DIAGNOSIS — K2270 Barrett's esophagus without dysplasia: Secondary | ICD-10-CM | POA: Diagnosis not present

## 2018-12-23 DIAGNOSIS — I10 Essential (primary) hypertension: Secondary | ICD-10-CM | POA: Diagnosis not present

## 2018-12-23 DIAGNOSIS — K222 Esophageal obstruction: Secondary | ICD-10-CM | POA: Diagnosis not present

## 2018-12-23 DIAGNOSIS — K209 Esophagitis, unspecified: Secondary | ICD-10-CM | POA: Diagnosis not present

## 2018-12-23 DIAGNOSIS — K449 Diaphragmatic hernia without obstruction or gangrene: Secondary | ICD-10-CM

## 2018-12-23 DIAGNOSIS — J45909 Unspecified asthma, uncomplicated: Secondary | ICD-10-CM | POA: Diagnosis not present

## 2018-12-23 DIAGNOSIS — K219 Gastro-esophageal reflux disease without esophagitis: Secondary | ICD-10-CM | POA: Diagnosis not present

## 2018-12-23 MED ORDER — SODIUM CHLORIDE 0.9 % IV SOLN: 500.0000 mL | Freq: Once | INTRAVENOUS | Status: DC

## 2018-12-23 NOTE — Progress Notes (Signed)
Report given to PACU, vss 

## 2018-12-23 NOTE — Progress Notes (Signed)
Kenalog 200mg /5 ml. Injected 1 ml in 1/4 to 4 quadrants of the mid esophagus. Good results.  Lot# [id number], Exp 05/2020

## 2018-12-23 NOTE — Patient Instructions (Signed)
YOU HAD AN ENDOSCOPIC PROCEDURE TODAY AT THE [facility]:   Refer to the procedure report that was given to you for any specific questions about what was found during the examination.  If the procedure report does not answer your questions, please call your gastroenterologist to clarify.  If you requested that your care partner not be given the details of your procedure findings, then the procedure report has been included in a sealed envelope for you to review at your convenience later.  YOU SHOULD EXPECT: Some feelings of bloating in the abdomen. Passage of more gas than usual.  Walking can help get rid of the air that was put into your GI tract during the procedure and reduce the bloating. If you had a lower endoscopy (such as a colonoscopy or flexible sigmoidoscopy) you may notice spotting of blood in your stool or on the toilet paper. If you underwent a bowel prep for your procedure, you may not have a normal bowel movement for a few days.  Please Note:  You might notice some irritation and congestion in your nose or some drainage.  This is from the oxygen used during your procedure.  There is no need for concern and it should clear up in a day or so.  SYMPTOMS TO REPORT IMMEDIATELY:   Following upper endoscopy (EGD)  Vomiting of blood or coffee ground material  New chest pain or pain under the shoulder blades  Painful or persistently difficult swallowing  New shortness of breath  Fever of 100F or higher  Black, tarry-looking stools  For urgent or emergent issues, a gastroenterologist can be reached at any hour by calling [phone].   DIET:  We do recommend a small meal at first, but then you may proceed to your regular diet.  Drink plenty of fluids but you should avoid alcoholic beverages for 24 hours.  ACTIVITY:  You should plan to take it easy for the rest of today and you should NOT DRIVE or use heavy machinery until tomorrow (because of the sedation medicines used  during the test).    FOLLOW UP: Our staff will call the number listed on your records 48-72 hours following your procedure to check on you and address any questions or concerns that you may have regarding the information given to you following your procedure. If we do not reach you, we will leave a message.  We will attempt to reach you two times.  During this call, we will ask if you have developed any symptoms of COVID 19. If you develop any symptoms (ie: fever, flu-like symptoms, shortness of breath, cough etc.) before then, please call [phone].  If you test positive for Covid 19 in the 2 weeks post procedure, please call and report this information to us.    If any biopsies were taken you will be contacted by phone or by letter within the next 1-3 weeks.  Please call us at [phone] if you have not heard about the biopsies in 3 weeks.    SIGNATURES/CONFIDENTIALITY: You and/or your care partner have signed paperwork which will be entered into your electronic medical record.  These signatures attest to the fact that that the information above on your After Visit Summary has been reviewed and is understood.  Full responsibility of the confidentiality of this discharge information lies with you and/or your care-partner. 

## 2018-12-23 NOTE — Progress Notes (Signed)
Temp- [place]  12-08-18- negative Covid test

## 2018-12-23 NOTE — Op Note (Signed)
[facility] Patient Name: Bartholomew BoardsFred Jirak Procedure Date: 12/23/2018 2:02 PM MRN: [id number] Endoscopist: Viviann SpareSteven P. 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 Here for repeat EGD with further                            therapy Medicines:                Monitored Anesthesia Care Procedure:                Pre-Anesthesia Assessment:                           - Prior to the procedure, a History and Physical                            was performed, and patient medications and                            allergies were reviewed. The patient's tolerance of                            previous anesthesia was also reviewed. The risks                            and benefits of the procedure and the sedation                            options and risks were discussed with the patient.                            All questions were answered, and informed consent                            was obtained. Prior Anticoagulants: The patient has  taken no previous anticoagulant or antiplatelet                            agents. ASA Grade Assessment: II - A patient with                            mild systemic disease. After reviewing the risks                            and benefits, the patient was deemed in                            satisfactory condition to undergo the procedure.                           After obtaining informed consent, the endoscope was                     passed under direct vision. Throughout the                            procedure, the patient's blood pressure, pulse, and                            oxygen saturations were monitored continuously. The                            Endoscope was introduced through the mouth, and                            advanced to the second part of duodenum. The upper                            GI endoscopy was accomplished without difficulty.                            The patient tolerated the procedure well. Scope In: Scope Out: Findings:                 Esophagogastric landmarks were identified: the                            Z-line was found at 35 cm, the gastroesophageal                            junction was found at 38 cm and the upper extent of                            the gastric folds was found at 40 cm from the                            incisors.                           A 2 cm hiatal hernia  was present.                           Barrett's esophagus was present in the lower third                            of the esophagus. The maximum longitudinal extent                            of these mucosal changes was 2-3 cm in length. This                            was biopsied on relatively recent EGD, no further                            biopsies done today.                           One benign-appearing, intrinsic moderate stenosis                            was found 30 to 34 cm from the incisors. This                            stenosis measured 4 cm (in length). The stenosis                            was initially not able to be traversed after. 4                            quadrants were successfully injected with of                            triamcinolone (kenolog) (40 mg/mL) for drug                            delivery prior to dilation. A guidewire was placed                            and the scope was withdrawn. Dilation was performed                             with a Savary dilator with mild resistance at 8 mm,                            10 mm, and then 11 mm. Relook endoscopy was then                            able to traverse the stricture, with small mucosal                            wrents noted. The scope was removed and additional  dilation was performed to 12 mm and 13 mm, at which                            time multiple appropriate mucosal wrents were noted.                           The exam of the esophagus was otherwise normal.                           There was evidence of a gastrostomy present in the                            gastric body. This was characterized by healthy                            appearing mucosa.                           The exam of the stomach was otherwise normal.                           The duodenal bulb and second portion of the                            duodenum were normal. Complications:            No immediate complications. Estimated blood loss:                            Minimal. Estimated Blood Loss:     Estimated blood loss was minimal. Impression:               - Esophagogastric landmarks identified.                           - 2 cm hiatal hernia.                           - Barrett's esophagus.                           - Benign-appearing esophageal stenosis. Injected                            with steroid. Dilated up to 13mm Savary as                            outlined, able to traverse with adult endoscope for                            the first time.                           - Gastrostomy present characterized by healthy  appearing mucosa.                           - Normal duodenal bulb and second portion of the                            duodenum. Recommendation:           - Patient has a contact number available for                            emergencies. The signs and symptoms of potential                            delayed  complications were discussed with the                            patient. Return to normal activities tomorrow.                            Written discharge instructions were provided to the                            patient.                           - Full liquid diet. No solids yet to avoid                            impaction.                           - Continue present medications.                           - Repeat upper endoscopy in 2 weeks for additional                            dilation Viviann SpareSteven P. Jaymason Ledesma, MD 12/23/2018 2:40:06 PM This report has been signed electronically.

## 2018-12-23 NOTE — Progress Notes (Signed)
Called to room to assist during endoscopic procedure.  Patient ID and intended procedure confirmed with present staff. Received instructions for my participation in the procedure from the performing physician.  

## 2018-12-27 ENCOUNTER — Telehealth: Payer: Self-pay

## 2018-12-27 NOTE — Telephone Encounter (Signed)
  Follow up Call-  Call back number 12/23/2018 12/06/2018 11/19/2018  Post procedure Call Back phone  # [phone]  Permission to leave phone message Yes Yes Yes  Some recent data might be hidden     Patient questions:  Do you have a fever, pain , or abdominal swelling? No. Pain Score  0 *  Have you tolerated food without any problems? Yes.    Have you been able to return to your normal activities? Yes.    Do you have any questions about your discharge instructions: Diet   No. Medications  No. Follow up visit  No.  Do you have questions or concerns about your Care? No.  Actions: * If pain score is 4 or above: 1. No action needed, pain <4.Have you developed a fever since your procedure? no  2.   Have you had an respiratory symptoms (SOB or cough) since your procedure? no  3.   Have you tested positive for COVID 19 since your procedure no  4.   Have you had any family members/close contacts diagnosed with the COVID 19 since your procedure?  no   If yes to any of these questions please route to Joylene John, RN and Alphonsa Gin, Therapist, sports.

## 2018-12-28 ENCOUNTER — Other Ambulatory Visit: Payer: Self-pay

## 2018-12-28 ENCOUNTER — Ambulatory Visit (INDEPENDENT_AMBULATORY_CARE_PROVIDER_SITE_OTHER): Payer: Medicaid Other | Admitting: Internal Medicine

## 2018-12-28 VITALS — BP 141/76 | HR 84 | Temp 98.2°F | Ht 65.0 in | Wt 154.9 lb

## 2018-12-28 DIAGNOSIS — Z931 Gastrostomy status: Secondary | ICD-10-CM | POA: Diagnosis not present

## 2018-12-28 DIAGNOSIS — F1121 Opioid dependence, in remission: Secondary | ICD-10-CM

## 2018-12-28 DIAGNOSIS — M25572 Pain in left ankle and joints of left foot: Secondary | ICD-10-CM | POA: Diagnosis not present

## 2018-12-28 DIAGNOSIS — I10 Essential (primary) hypertension: Secondary | ICD-10-CM | POA: Diagnosis not present

## 2018-12-28 DIAGNOSIS — F1120 Opioid dependence, uncomplicated: Secondary | ICD-10-CM

## 2018-12-28 DIAGNOSIS — J45909 Unspecified asthma, uncomplicated: Secondary | ICD-10-CM | POA: Diagnosis not present

## 2018-12-28 MED ORDER — BUPRENORPHINE HCL-NALOXONE HCL 8-2 MG SL SUBL: 1.0000 | SUBLINGUAL_TABLET | Freq: Two times a day (BID) | SUBLINGUAL | 1 refills | Status: DC

## 2018-12-28 MED FILL — BUPRENORPHIN-NALOXON 8-2 MG: 8-2 | 14 days supply | Qty: 28 | Fill #0

## 2018-12-28 NOTE — Patient Instructions (Addendum)
Thank you for allowing [place] to provide your care today. Today we discussed your opioid use disorder    I have ordered urine labs for you. I will call if any are abnormal.    Today we made the following changes to your medications:  Please continue suboxone 8-2mg  twice a day  Please follow-up in 4 weeks.    Should you have any questions or concerns please call the internal medicine clinic at [phone].
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
## 2018-12-28 NOTE — Assessment & Plan Note (Signed)
Presents for management of moderate opioid use disorder. Had history of multiple relapses due to stressors from other chronic medical conditions, especially his dysphagia and requirement for G-tube feeding. Mentions improving symptoms after f/u with surgery but mentions having residual pain. Has f/u appointments in 3 days. Currently denies cravings, withdrawal symptoms. Denies any significant nausea or vomiting. Mentions recent relapse 3 days ago with fentanyl. Will need to emphasize avoiding relapse events once acute exacerbation of his esophageal disease has resolved.  - C/w suboxone 8mg  BID - Urine tox-assure today - F/u in 4 weeks

## 2018-12-30 NOTE — Progress Notes (Signed)
Internal Medicine Clinic Attending  I saw and evaluated the patient.  I personally confirmed the key portions of the history and exam documented by Dr. Truman Hayward and I reviewed pertinent patient test results.  The assessment, diagnosis, and plan were formulated together and I agree with the documentation in the resident's note.   We did stress the need for abstinence from opioids.  Currently, buprenorphine is serving as a risk reduction strategy as he stabilizes in treatment.

## 2019-01-01 LAB — TOXASSURE SELECT,+ANTIDEPR,UR

## 2019-01-03 ENCOUNTER — Ambulatory Visit: Payer: Self-pay

## 2019-01-03 ENCOUNTER — Other Ambulatory Visit: Payer: Self-pay

## 2019-01-03 VITALS — Ht 65.0 in | Wt 155.0 lb

## 2019-01-03 DIAGNOSIS — K222 Esophageal obstruction: Secondary | ICD-10-CM

## 2019-01-03 NOTE — Progress Notes (Signed)
Per pt, no allergies to soy or egg products.Pt not taking any weight loss meds or using  O2 at home.  Pt denies sedation problems. Pt refused emmi info.  The PV was done over the phone due to COVID-19. Verified pt's address. Pt does not have insurance at this time but is working thru an Risk manager thru [employer].  Reviewed pt's medical hx and prep instructions and he will come to the 3rd floor to pick up his instructions tomorrow. Informed pt to call with any questions or changes prior to his procedure. He understood.

## 2019-01-04 ENCOUNTER — Encounter: Payer: Self-pay | Admitting: Gastroenterology

## 2019-01-06 ENCOUNTER — Telehealth: Payer: Self-pay

## 2019-01-06 ENCOUNTER — Telehealth: Payer: Self-pay | Admitting: *Deleted

## 2019-01-06 NOTE — Telephone Encounter (Signed)
Vincent Clarke has called back and states pt is now on medicaid so he will be able to continue feedings

## 2019-01-06 NOTE — Telephone Encounter (Signed)
Covid-19 screening questions   Do you now or have you had a fever in the last 14 days? NO   Do you have any respiratory symptoms of shortness of breath or cough now or in the last 14 days? NO  Do you have any family members or close contacts with diagnosed or suspected Covid-19 in the past 14 days? NO  Have you been tested for Covid-19 and found to be positive? NO        

## 2019-01-06 NOTE — Telephone Encounter (Signed)
Vincent Clarke, [phone], calls to say pt's charity account has expired at [facility]( [facility]) and pt will no longer be able to get tube feeding liquid from them, she has gotten in touch with the person that is the caseworker at [facility] who has filed for his medicaid but [name] states she has done all she can do to assist pt. [name] states she has ask pt to buy carnation instant breakfast until he can get more of the tube feeding liquid. Will send to [facility]. And dr Maudie Mercury for advice. Have called [facility] for prices and check on [employer] possibility for jevity and pro-stat sugar free feeding supplement. Pro-stat depending on amount goes up to 90.00 Have spoken to Morrison, pharmacist at [facility] and she will speak to stevef. But he is in a meeting until [employer] today.

## 2019-01-07 ENCOUNTER — Encounter: Payer: Self-pay | Admitting: Gastroenterology

## 2019-01-07 ENCOUNTER — Telehealth: Payer: Self-pay | Admitting: Gastroenterology

## 2019-01-07 DIAGNOSIS — R1310 Dysphagia, unspecified: Secondary | ICD-10-CM | POA: Diagnosis not present

## 2019-01-07 DIAGNOSIS — K222 Esophageal obstruction: Secondary | ICD-10-CM | POA: Diagnosis not present

## 2019-01-07 NOTE — Telephone Encounter (Signed)
Thank you Vincent Clarke 

## 2019-01-10 ENCOUNTER — Other Ambulatory Visit: Payer: Self-pay | Admitting: Internal Medicine

## 2019-01-10 DIAGNOSIS — F1121 Opioid dependence, in remission: Secondary | ICD-10-CM

## 2019-01-10 MED ORDER — BUPRENORPHINE HCL-NALOXONE HCL 8-2 MG SL SUBL: 1.0000 | SUBLINGUAL_TABLET | Freq: Two times a day (BID) | SUBLINGUAL | 0 refills | Status: DC

## 2019-01-10 MED FILL — BUPRENORPHIN-NALOXON 8-2 MG: 8-2 | 14 days supply | Qty: 28 | Fill #0

## 2019-01-10 NOTE — Telephone Encounter (Signed)
Needs a refill on buprenorphine-naloxone (SUBOXONE) 8-2 mg SUBL SL tablet ;pt contact [phone]  Pt appt is 9/15, pt said there wasn't enough medicine

## 2019-01-11 DIAGNOSIS — K222 Esophageal obstruction: Secondary | ICD-10-CM | POA: Diagnosis not present

## 2019-01-11 DIAGNOSIS — R1310 Dysphagia, unspecified: Secondary | ICD-10-CM | POA: Diagnosis not present

## 2019-01-12 MED FILL — PANTOPRAZOLE SOD DR 40 MG T: 40 | 15 days supply | Qty: 60 | Fill #1

## 2019-01-13 ENCOUNTER — Telehealth: Payer: Self-pay

## 2019-01-13 NOTE — Telephone Encounter (Signed)
Pt calling back

## 2019-01-13 NOTE — Telephone Encounter (Signed)
Covid-19 screening questions   Do you now or have you had a fever in the last 14 days? NO   Do you have any respiratory symptoms of shortness of breath or cough now or in the last 14 days? NO  Do you have any family members or close contacts with diagnosed or suspected Covid-19 in the past 14 days? NO  Have you been tested for Covid-19 and found to be positive? NO        

## 2019-01-13 NOTE — Telephone Encounter (Signed)
Pt states medicaid will not paid for buprenorphine-naloxone (SUBOXONE) 8-2 mg SUBL SL tablet. Please call pt back.

## 2019-01-14 ENCOUNTER — Encounter: Payer: Self-pay | Admitting: Gastroenterology

## 2019-01-14 ENCOUNTER — Other Ambulatory Visit: Payer: Self-pay

## 2019-01-14 ENCOUNTER — Ambulatory Visit (AMBULATORY_SURGERY_CENTER): Payer: Medicaid Other | Admitting: Gastroenterology

## 2019-01-14 VITALS — BP 126/76 | HR 60 | Temp 98.2°F | Resp 21 | Ht 65.0 in | Wt 154.0 lb

## 2019-01-14 DIAGNOSIS — K449 Diaphragmatic hernia without obstruction or gangrene: Secondary | ICD-10-CM

## 2019-01-14 DIAGNOSIS — K2270 Barrett's esophagus without dysplasia: Secondary | ICD-10-CM

## 2019-01-14 DIAGNOSIS — K222 Esophageal obstruction: Secondary | ICD-10-CM | POA: Diagnosis not present

## 2019-01-14 DIAGNOSIS — R1310 Dysphagia, unspecified: Secondary | ICD-10-CM | POA: Diagnosis not present

## 2019-01-14 MED ORDER — SODIUM CHLORIDE 0.9 % IV SOLN: 500.0000 mL | Freq: Once | INTRAVENOUS | Status: DC

## 2019-01-14 NOTE — Patient Instructions (Signed)
YOU HAD AN ENDOSCOPIC PROCEDURE TODAY AT [facility]:   Refer to the procedure report that was given to you for any specific questions about what was found during the examination.  If the procedure report does not answer your questions, please call your gastroenterologist to clarify.  If you requested that your care partner not be given the details of your procedure findings, then the procedure report has been included in a sealed envelope for you to review at your convenience later.  YOU SHOULD EXPECT: Some feelings of bloating in the abdomen. Passage of more gas than usual.  Walking can help get rid of the air that was put into your GI tract during the procedure and reduce the bloating. If you had a lower endoscopy (such as a colonoscopy or flexible sigmoidoscopy) you may notice spotting of blood in your stool or on the toilet paper. If you underwent a bowel prep for your procedure, you may not have a normal bowel movement for a few days.  Please Note:  You might notice some irritation and congestion in your nose or some drainage.  This is from the oxygen used during your procedure.  There is no need for concern and it should clear up in a day or so.  SYMPTOMS TO REPORT IMMEDIATELY:    Following upper endoscopy (EGD)  Vomiting of blood or coffee ground material  New chest pain or pain under the shoulder blades  Painful or persistently difficult swallowing  New shortness of breath  Fever of 100F or higher  Black, tarry-looking stools  For urgent or emergent issues, a gastroenterologist can be reached at any hour by calling [phone].   DIET:  FULL LIQUID DIET  ACTIVITY:  You should plan to take it easy for the rest of today and you should NOT DRIVE or use heavy machinery until tomorrow (because of the sedation medicines used during the test).    FOLLOW UP: Our staff will call the number listed on your records 48-72 hours following your procedure to check on you and  address any questions or concerns that you may have regarding the information given to you following your procedure. If we do not reach you, we will leave a message.  We will attempt to reach you two times.  During this call, we will ask if you have developed any symptoms of COVID 19. If you develop any symptoms (ie: fever, flu-like symptoms, shortness of breath, cough etc.) before then, please call [phone].  If you test positive for Covid 19 in the 2 weeks post procedure, please call and report this information to [place].    If any biopsies were taken you will be contacted by phone or by letter within the next 1-3 weeks.  Please call us at [phone] if you have not heard about the biopsies in 3 weeks.    SIGNATURES/CONFIDENTIALITY: You and/or your care partner have signed paperwork which will be entered into your electronic medical record.  These signatures attest to the fact that that the information above on your After Visit Summary has been reviewed and is understood.  Full responsibility of the confidentiality of this discharge information lies with you and/or your care-partner.

## 2019-01-14 NOTE — Telephone Encounter (Signed)
Vincent Clarke, med needs PA, can you please try to do ASAP, thanks!

## 2019-01-14 NOTE — Progress Notes (Signed)
A/ox3, pleased with MAC, report to RN 

## 2019-01-14 NOTE — Telephone Encounter (Signed)
Called pt, he states medicaid is refusing to let him have tablets, gladys has started a PA, do you want to change to strips? They will pay for these?

## 2019-01-14 NOTE — Progress Notes (Signed)
Called to room to assist during endoscopic procedure.  Patient ID and intended procedure confirmed with present staff. Received instructions for my participation in the procedure from the performing physician.  

## 2019-01-14 NOTE — Telephone Encounter (Signed)
This seems odd, it does however appear that he last filled a Rx on 8/31 (2 week Rx) according to the [facility], so I suspect we do have some time to sort this out. I assume he will need the strips called in for 01/24/19 fill date

## 2019-01-14 NOTE — Op Note (Addendum)
[facility] Patient Name: Vincent Clarke Procedure Date: 01/14/2019 2:17 PM MRN: [id number] Endoscopist: Viviann SpareSteven P. Adela LankArmbruster , MD Age: 53 Referring MD:  Date of Birth: 10/14/1965 Gender: Male Account #: [id number] Procedure:                Upper GI endoscopy Indications:              Dysphagia - severe esophageal stricture s/p kneedle                            knife [facility] with recurrent                            stricturing, on high dose protonix, multiple                            dilations, s/p kenologue injection 2 weeks ago with                            additional dilation (last done to 13mm), here for                            repeat EGD. PEG in place for nutrition. Medicines:                Monitored Anesthesia Care Procedure:                Pre-Anesthesia Assessment:                           - Prior to the procedure, a History and Physical                            was performed, and patient medications and                            allergies were reviewed. The patient's tolerance of                            previous anesthesia was also reviewed. The risks                            and benefits of the procedure and the sedation                            options and risks were discussed with the patient.                            All questions were answered, and informed consent                            was obtained. Prior Anticoagulants: The patient has                            taken no previous anticoagulant or antiplatelet  agents. ASA Grade Assessment: II - A patient with                            mild systemic disease. After reviewing the risks                            and benefits, the patient was deemed in                            satisfactory condition to undergo the procedure.                           After obtaining informed consent, the endoscope was                            passed under  direct vision. Throughout the                            procedure, the patient's blood pressure, pulse, and                            oxygen saturations were monitored continuously. The                            Endoscope was introduced through the mouth, and                            advanced to the antrum of the stomach. The upper GI                            endoscopy was accomplished without difficulty. The                            patient tolerated the procedure well. Scope In: Scope Out: Findings:                 Esophagogastric landmarks were identified: the                            Z-line was found at 35 cm, the gastroesophageal                            junction was found at 38 cm and the upper extent of                            the gastric folds was found at 40 cm from the                            incisors.                           A 2 cm hiatal hernia was present.  Barrett's esophagus was present in the lower third                            of the esophagus. The maximum longitudinal extent                            of these mucosal changes was 3 cm in length.                            Biopsies not taken as this has been done in the                            past.                           One benign-appearing, intrinsic stenosis was found                            30 to 34 cm from the incisors. It was previously                            dilated to 30mm however had strictured down again                            significantly. This stenosis measured 4 cm (in                            length). The stenosis was able to be traversed                            without dilation beforehand for the first time. The                            scope itself dilated the stricture. A guidewire was                            placed and the scope was withdrawn. Dilation was                            performed with a Savary dilator with mild                             resistance at 12 mm, 13 mm and 14 mm. Relook                            endoscopy showed 3 appropriate mucosal wrents, one                            large. No further dilation performed.                           The exam of the esophagus was otherwise normal.  There was evidence of a gastrostomy present in the                            gastric body. This was characterized by healthy                            appearing mucosa. Complications:            No immediate complications. Estimated blood loss:                            Minimal. Estimated Blood Loss:     Estimated blood loss was minimal. Impression:               - Esophagogastric landmarks identified.                           - 2 cm hiatal hernia.                           - Barrett's esophagus.                           - Benign-appearing esophageal stenosis. Dilated to                            14mm..                           - Gastrostomy present characterized by healthy                            appearing mucosa.                           Overall, making progress with dilation however it                            has been a slow process due to significant                            restricturing despite high dose protonix (80mg  BID)                            and steroid injection Recommendation:           - Patient has a contact number available for                            emergencies. The signs and symptoms of potential                            delayed complications were discussed with the                            patient. Return to normal activities tomorrow.  Written discharge instructions were provided to the                            patient.                           - Full liquid diet.                           - Continue present medications.                           - Repeat upper endoscopy in 2 weeks for retreatment. Remo Lipps  P. Harsh Trulock, MD 01/14/2019 2:41:53 PM This report has been signed electronically.

## 2019-01-19 ENCOUNTER — Encounter: Payer: Self-pay | Admitting: Internal Medicine

## 2019-01-19 ENCOUNTER — Telehealth: Payer: Self-pay | Admitting: Internal Medicine

## 2019-01-19 ENCOUNTER — Telehealth: Payer: Self-pay

## 2019-01-19 NOTE — Telephone Encounter (Signed)
  Follow up Call-  Call back number 01/14/2019 12/23/2018 12/06/2018 11/19/2018  Post procedure Call Back phone  # [phone]  Permission to leave phone message Yes Yes Yes Yes  Some recent data might be hidden     Patient questions:  Do you have a fever, pain , or abdominal swelling? No. Pain Score  0 *  Have you tolerated food without any problems? Yes.    Have you been able to return to your normal activities? Yes.    Do you have any questions about your discharge instructions: Diet   No. Medications  No. Follow up visit  No.  Do you have questions or concerns about your Care? No.  Actions: * If pain score is 4 or above: 1. No action needed, pain <4.Have you developed a fever since your procedure? no  2.   Have you had an respiratory symptoms (SOB or cough) since your procedure? no  3.   Have you tested positive for COVID 19 since your procedure no  4.   Have you had any family members/close contacts diagnosed with the COVID 19 since your procedure?  no   If yes to any of these questions please route to Joylene John, RN and Alphonsa Gin, Therapist, sports.

## 2019-01-19 NOTE — Telephone Encounter (Signed)
Patient called back states he is out of suboxone and wants to know if any progress has been made on PA. Call placed to [facility]. States Rx for suboxone sent 01/10/2019 has been on hold awaiting PA. Will forward to PA staff. Hubbard Hartshorn, RN, BSN

## 2019-01-20 ENCOUNTER — Telehealth: Payer: Self-pay | Admitting: *Deleted

## 2019-01-20 MED ORDER — BUPRENORPHINE HCL-NALOXONE HCL 8-2 MG SL FILM: 1.0000 | ORAL_FILM | Freq: Two times a day (BID) | SUBLINGUAL | 0 refills | Status: DC

## 2019-01-20 MED FILL — SUBOXONE 8 MG-2 MG SL FILM: 8-2 | 30 days supply | Qty: 60 | Fill #0

## 2019-01-20 NOTE — Telephone Encounter (Signed)
Call to [facility].  Medicaid will cover Suboxone film.  Patient's tablets form will need ti changed to the film.  Spoke with Dr. Heber McDermott who will rewrite the prescription for the Suboxone Film for the patient. Patient was called and notified of the needed change.  Sander Nephew, RN 01/20/2019 10:11 AM.

## 2019-01-20 NOTE — Telephone Encounter (Signed)
Change to suboxone film due to insurance preference.

## 2019-01-20 NOTE — Addendum Note (Signed)
Addended by: Joni Reining C on: 01/20/2019 10:19 AM   Modules accepted: Orders

## 2019-01-22 ENCOUNTER — Encounter (HOSPITAL_COMMUNITY): Payer: Self-pay | Admitting: Emergency Medicine

## 2019-01-22 ENCOUNTER — Other Ambulatory Visit: Payer: Self-pay

## 2019-01-22 ENCOUNTER — Inpatient Hospital Stay (HOSPITAL_COMMUNITY)
Admission: EM | Admit: 2019-01-22 | Discharge: 2019-01-25 | DRG: 917 | Disposition: A | Discharge status: Home / Self Care | Payer: Medicaid Other | Attending: Student in an Organized Health Care Education/Training Program | Admitting: Student in an Organized Health Care Education/Training Program

## 2019-01-22 ENCOUNTER — Emergency Department (HOSPITAL_COMMUNITY): Payer: Medicaid Other

## 2019-01-22 DIAGNOSIS — F1520 Other stimulant dependence, uncomplicated: Secondary | ICD-10-CM | POA: Diagnosis present

## 2019-01-22 DIAGNOSIS — I739 Peripheral vascular disease, unspecified: Secondary | ICD-10-CM | POA: Diagnosis not present

## 2019-01-22 DIAGNOSIS — Z781 Physical restraint status: Secondary | ICD-10-CM

## 2019-01-22 DIAGNOSIS — Z8719 Personal history of other diseases of the digestive system: Secondary | ICD-10-CM

## 2019-01-22 DIAGNOSIS — Z931 Gastrostomy status: Secondary | ICD-10-CM

## 2019-01-22 DIAGNOSIS — I10 Essential (primary) hypertension: Secondary | ICD-10-CM | POA: Diagnosis present

## 2019-01-22 DIAGNOSIS — Z885 Allergy status to narcotic agent status: Secondary | ICD-10-CM | POA: Diagnosis not present

## 2019-01-22 DIAGNOSIS — F1120 Opioid dependence, uncomplicated: Secondary | ICD-10-CM | POA: Diagnosis present

## 2019-01-22 DIAGNOSIS — F17210 Nicotine dependence, cigarettes, uncomplicated: Secondary | ICD-10-CM | POA: Diagnosis present

## 2019-01-22 DIAGNOSIS — J45909 Unspecified asthma, uncomplicated: Secondary | ICD-10-CM | POA: Diagnosis present

## 2019-01-22 DIAGNOSIS — F1510 Other stimulant abuse, uncomplicated: Secondary | ICD-10-CM | POA: Diagnosis not present

## 2019-01-22 DIAGNOSIS — R0902 Hypoxemia: Secondary | ICD-10-CM | POA: Diagnosis not present

## 2019-01-22 DIAGNOSIS — K222 Esophageal obstruction: Secondary | ICD-10-CM

## 2019-01-22 DIAGNOSIS — R509 Fever, unspecified: Secondary | ICD-10-CM | POA: Diagnosis present

## 2019-01-22 DIAGNOSIS — K219 Gastro-esophageal reflux disease without esophagitis: Secondary | ICD-10-CM | POA: Diagnosis not present

## 2019-01-22 DIAGNOSIS — K226 Gastro-esophageal laceration-hemorrhage syndrome: Secondary | ICD-10-CM

## 2019-01-22 DIAGNOSIS — Z20828 Contact with and (suspected) exposure to other viral communicable diseases: Secondary | ICD-10-CM | POA: Diagnosis present

## 2019-01-22 DIAGNOSIS — F15229 Other stimulant dependence with intoxication, unspecified: Secondary | ICD-10-CM | POA: Diagnosis present

## 2019-01-22 DIAGNOSIS — G9340 Encephalopathy, unspecified: Secondary | ICD-10-CM | POA: Diagnosis not present

## 2019-01-22 DIAGNOSIS — F1910 Other psychoactive substance abuse, uncomplicated: Secondary | ICD-10-CM | POA: Diagnosis present

## 2019-01-22 DIAGNOSIS — R4182 Altered mental status, unspecified: Secondary | ICD-10-CM | POA: Diagnosis not present

## 2019-01-22 DIAGNOSIS — G92 Toxic encephalopathy: Secondary | ICD-10-CM | POA: Diagnosis not present

## 2019-01-22 DIAGNOSIS — Z79899 Other long term (current) drug therapy: Secondary | ICD-10-CM

## 2019-01-22 DIAGNOSIS — F1121 Opioid dependence, in remission: Secondary | ICD-10-CM | POA: Diagnosis present

## 2019-01-22 DIAGNOSIS — Z23 Encounter for immunization: Secondary | ICD-10-CM

## 2019-01-22 DIAGNOSIS — T43621A Poisoning by amphetamines, accidental (unintentional), initial encounter: Principal | ICD-10-CM | POA: Diagnosis present

## 2019-01-22 DIAGNOSIS — Z888 Allergy status to other drugs, medicaments and biological substances status: Secondary | ICD-10-CM | POA: Diagnosis not present

## 2019-01-22 DIAGNOSIS — Z825 Family history of asthma and other chronic lower respiratory diseases: Secondary | ICD-10-CM | POA: Diagnosis not present

## 2019-01-22 DIAGNOSIS — R52 Pain, unspecified: Secondary | ICD-10-CM | POA: Diagnosis not present

## 2019-01-22 DIAGNOSIS — R825 Elevated urine levels of drugs, medicaments and biological substances: Secondary | ICD-10-CM | POA: Diagnosis not present

## 2019-01-22 DIAGNOSIS — R404 Transient alteration of awareness: Secondary | ICD-10-CM | POA: Diagnosis not present

## 2019-01-22 DIAGNOSIS — T887XXA Unspecified adverse effect of drug or medicament, initial encounter: Secondary | ICD-10-CM | POA: Diagnosis not present

## 2019-01-22 DIAGNOSIS — R451 Restlessness and agitation: Secondary | ICD-10-CM | POA: Diagnosis not present

## 2019-01-22 DIAGNOSIS — R1310 Dysphagia, unspecified: Secondary | ICD-10-CM | POA: Diagnosis not present

## 2019-01-22 DIAGNOSIS — G929 Unspecified toxic encephalopathy: Secondary | ICD-10-CM | POA: Diagnosis present

## 2019-01-22 LAB — COMPREHENSIVE METABOLIC PANEL
ALT: 24 U/L (ref 0–44)
AST: 28 U/L (ref 15–41)
Albumin: 4.5 g/dL (ref 3.5–5.0)
Alkaline Phosphatase: 101 U/L (ref 38–126)
Anion gap: 15 (ref 5–15)
BUN: 9 mg/dL (ref 6–20)
CO2: 20 mmol/L — ABNORMAL LOW (ref 22–32)
Calcium: 9.9 mg/dL (ref 8.9–10.3)
Chloride: 108 mmol/L (ref 98–111)
Creatinine, Ser: 0.64 mg/dL (ref 0.61–1.24)
GFR calc Af Amer: >60 mL/min (ref >60)
GFR calc non Af Amer: >60 mL/min (ref >60)
Glucose, Bld: 121 mg/dL — ABNORMAL HIGH (ref 70–99)
Potassium: 3.5 mmol/L (ref 3.5–5.1)
Sodium: 143 mmol/L (ref 135–145)
Total Bilirubin: 0.2 mg/dL — ABNORMAL LOW (ref 0.3–1.2)
Total Protein: 8.2 g/dL — ABNORMAL HIGH (ref 6.5–8.1)

## 2019-01-22 LAB — CBC WITH DIFFERENTIAL/PLATELET
Abs Immature Granulocytes: 0.03 10*3/uL (ref 0.00–0.07)
Basophils Absolute: 0 10*3/uL (ref 0.0–0.1)
Basophils Relative: 0 %
Eosinophils Absolute: 0.1 10*3/uL (ref 0.0–0.5)
Eosinophils Relative: 1 %
HCT: 48.8 % (ref 39.0–52.0)
Hemoglobin: 16.3 g/dL (ref 13.0–17.0)
Immature Granulocytes: 0 %
Lymphocytes Relative: 12 %
Lymphs Abs: 1.3 10*3/uL (ref 0.7–4.0)
MCH: 30 pg (ref 26.0–34.0)
MCHC: 33.4 g/dL (ref 30.0–36.0)
MCV: 89.9 fL (ref 80.0–100.0)
Monocytes Absolute: 0.6 10*3/uL (ref 0.1–1.0)
Monocytes Relative: 5 %
Neutro Abs: 9.5 10*3/uL — ABNORMAL HIGH (ref 1.7–7.7)
Neutrophils Relative %: 82 %
Platelets: 364 10*3/uL (ref 150–400)
RBC: 5.43 MIL/uL (ref 4.22–5.81)
RDW: 12.4 % (ref 11.5–15.5)
WBC: 11.5 10*3/uL — ABNORMAL HIGH (ref 4.0–10.5)
nRBC: 0 % (ref 0.0–0.2)

## 2019-01-22 LAB — APTT: aPTT: 34 seconds (ref 24–36)

## 2019-01-22 LAB — RAPID URINE DRUG SCREEN, HOSP PERFORMED
Amphetamines: POSITIVE — AB
Barbiturates: NOT DETECTED
Benzodiazepines: NOT DETECTED
Cocaine: NOT DETECTED
Opiates: POSITIVE — AB
Tetrahydrocannabinol: NOT DETECTED

## 2019-01-22 LAB — ETHANOL: Alcohol, Ethyl (B): <10 mg/dL (ref <10)

## 2019-01-22 LAB — URINALYSIS, ROUTINE W REFLEX MICROSCOPIC
Bilirubin Urine: NEGATIVE
Glucose, UA: NEGATIVE mg/dL
Hgb urine dipstick: NEGATIVE
Ketones, ur: NEGATIVE mg/dL
Leukocytes,Ua: NEGATIVE
Nitrite: NEGATIVE
Protein, ur: NEGATIVE mg/dL
Specific Gravity, Urine: 1.015 (ref 1.005–1.030)
pH: 8.5 — ABNORMAL HIGH (ref 5.0–8.0)

## 2019-01-22 LAB — LACTIC ACID, PLASMA
Lactic Acid, Venous: 3.3 mmol/L (ref 0.5–1.9)
Lactic Acid, Venous: 2 mmol/L (ref 0.5–1.9)

## 2019-01-22 LAB — PROTIME-INR
INR: 1 (ref 0.8–1.2)
Prothrombin Time: 12.6 seconds (ref 11.4–15.2)

## 2019-01-22 LAB — ACETAMINOPHEN LEVEL: Acetaminophen (Tylenol), Serum: <10 ug/mL — ABNORMAL LOW (ref 10–30)

## 2019-01-22 LAB — SALICYLATE LEVEL: Salicylate Lvl: <7 mg/dL (ref 2.8–30.0)

## 2019-01-22 LAB — CBG MONITORING, ED: Glucose-Capillary: 117 mg/dL — ABNORMAL HIGH (ref 70–99)

## 2019-01-22 LAB — CK: Total CK: 310 U/L (ref 49–397)

## 2019-01-22 LAB — SARS CORONAVIRUS 2 BY RT PCR (HOSPITAL ORDER, PERFORMED IN ~~LOC~~ HOSPITAL LAB): SARS Coronavirus 2: NEGATIVE

## 2019-01-22 MED ORDER — SODIUM CHLORIDE 0.9 % IV SOLN
2.0000 g | Freq: Once | INTRAVENOUS | Status: AC
  Administered 2019-01-22: 2 g via INTRAVENOUS
  Filled 2019-01-22: qty 2

## 2019-01-22 MED ORDER — LORAZEPAM 1 MG PO TABS: 1.0000 mg | ORAL_TABLET | Freq: Four times a day (QID) | ORAL | Status: DC | PRN

## 2019-01-22 MED ORDER — MIDAZOLAM HCL 2 MG/2ML IJ SOLN
2.0000 mg | Freq: Once | INTRAMUSCULAR | Status: AC
  Administered 2019-01-22: 2 mg via INTRAVENOUS
  Filled 2019-01-22: qty 2

## 2019-01-22 MED ORDER — DROPERIDOL 2.5 MG/ML IJ SOLN
5.0000 mg | Freq: Once | INTRAMUSCULAR | Status: AC
  Administered 2019-01-22: 5 mg via INTRAVENOUS
  Filled 2019-01-22: qty 2

## 2019-01-22 MED ORDER — LORAZEPAM 2 MG/ML IJ SOLN: 0.0000 mg | Freq: Two times a day (BID) | INTRAMUSCULAR | Status: DC

## 2019-01-22 MED ORDER — BUPRENORPHINE HCL 8 MG SL SUBL
8.0000 mg | SUBLINGUAL_TABLET | Freq: Two times a day (BID) | SUBLINGUAL | Status: DC
  Administered 2019-01-24 – 2019-01-25 (×3): 8 mg via SUBLINGUAL
  Filled 2019-01-22 (×3): qty 1

## 2019-01-22 MED ORDER — LORAZEPAM 2 MG/ML IJ SOLN
1.0000 mg | Freq: Four times a day (QID) | INTRAMUSCULAR | Status: DC | PRN
  Administered 2019-01-23: 3 mg via INTRAVENOUS
  Administered 2019-01-23: 4 mg via INTRAVENOUS
  Administered 2019-01-23 (×2): 2 mg via INTRAVENOUS
  Administered 2019-01-24: 3 mg via INTRAVENOUS
  Filled 2019-01-22: qty 2
  Filled 2019-01-22 (×2): qty 1
  Filled 2019-01-22 (×2): qty 2

## 2019-01-22 MED ORDER — VITAMIN B-1 100 MG PO TABS
100.0000 mg | ORAL_TABLET | Freq: Every day | ORAL | Status: DC
  Administered 2019-01-24 – 2019-01-25 (×2): 100 mg via ORAL
  Filled 2019-01-22 (×2): qty 1

## 2019-01-22 MED ORDER — ONDANSETRON HCL 4 MG/2ML IJ SOLN: 4.0000 mg | Freq: Four times a day (QID) | INTRAMUSCULAR | Status: DC | PRN

## 2019-01-22 MED ORDER — METRONIDAZOLE IN NACL 5-0.79 MG/ML-% IV SOLN
500.0000 mg | Freq: Once | INTRAVENOUS | Status: AC
  Administered 2019-01-22: 500 mg via INTRAVENOUS
  Filled 2019-01-22: qty 100

## 2019-01-22 MED ORDER — THIAMINE HCL 100 MG/ML IJ SOLN
100.0000 mg | Freq: Every day | INTRAMUSCULAR | Status: DC
  Administered 2019-01-22 – 2019-01-23 (×2): 100 mg via INTRAVENOUS
  Filled 2019-01-22 (×2): qty 2

## 2019-01-22 MED ORDER — LACTATED RINGERS IV SOLN
INTRAVENOUS | Status: DC
  Administered 2019-01-22 – 2019-01-24 (×3): via INTRAVENOUS
  Administered 2019-01-24: 1000 mL via INTRAVENOUS

## 2019-01-22 MED ORDER — VANCOMYCIN HCL IN DEXTROSE 1-5 GM/200ML-% IV SOLN
1000.0000 mg | Freq: Once | INTRAVENOUS | Status: AC
  Administered 2019-01-22: 1000 mg via INTRAVENOUS
  Filled 2019-01-22: qty 200

## 2019-01-22 MED ORDER — HALOPERIDOL LACTATE 5 MG/ML IJ SOLN
5.0000 mg | Freq: Once | INTRAMUSCULAR | Status: AC
  Administered 2019-01-22: 5 mg via INTRAVENOUS
  Filled 2019-01-22: qty 1

## 2019-01-22 MED ORDER — LORAZEPAM 1 MG PO TABS: 0.0000 mg | ORAL_TABLET | Freq: Four times a day (QID) | ORAL | Status: DC

## 2019-01-22 MED ORDER — SODIUM CHLORIDE 0.9 % IV BOLUS (SEPSIS)
1000.0000 mL | Freq: Once | INTRAVENOUS | Status: AC
  Administered 2019-01-22: 1000 mL via INTRAVENOUS

## 2019-01-22 MED ORDER — LORAZEPAM 2 MG/ML IJ SOLN
2.0000 mg | Freq: Once | INTRAMUSCULAR | Status: AC
  Administered 2019-01-22: 2 mg via INTRAVENOUS
  Filled 2019-01-22: qty 1

## 2019-01-22 MED ORDER — SODIUM CHLORIDE 0.9 % IV BOLUS
1000.0000 mL | Freq: Once | INTRAVENOUS | Status: AC
  Administered 2019-01-22: 1000 mL via INTRAVENOUS

## 2019-01-22 MED ORDER — SODIUM CHLORIDE 0.9 % IV BOLUS (SEPSIS)
250.0000 mL | Freq: Once | INTRAVENOUS | Status: AC
  Administered 2019-01-22: 250 mL via INTRAVENOUS

## 2019-01-22 MED ORDER — ONDANSETRON HCL 4 MG PO TABS: 4.0000 mg | ORAL_TABLET | Freq: Four times a day (QID) | ORAL | Status: DC | PRN

## 2019-01-22 MED ORDER — PANTOPRAZOLE SODIUM 40 MG PO TBEC
40.0000 mg | DELAYED_RELEASE_TABLET | Freq: Two times a day (BID) | ORAL | Status: DC
  Administered 2019-01-23 – 2019-01-25 (×5): 40 mg via ORAL
  Filled 2019-01-22 (×6): qty 1

## 2019-01-22 MED ORDER — LORAZEPAM 2 MG/ML IJ SOLN
0.0000 mg | Freq: Four times a day (QID) | INTRAMUSCULAR | Status: DC
  Administered 2019-01-22 (×3): 4 mg via INTRAVENOUS
  Filled 2019-01-22 (×3): qty 2

## 2019-01-22 MED ORDER — LORAZEPAM 1 MG PO TABS: 0.0000 mg | ORAL_TABLET | Freq: Two times a day (BID) | ORAL | Status: DC

## 2019-01-22 NOTE — ED Triage Notes (Signed)
Arrives via EMS, suspected overdose, patient does not respond to questions, only says he is "cold." White powder found in his room with blowtorch and straw. Pt does take suboxone and fentanyl per roommate. Pt has feeding tube. Pupils 5, diaphoretic in bed and curled up into a ball.

## 2019-01-22 NOTE — ED Notes (Signed)
[facility] has been called and notified of patient requiring transport to [facility], [place], Room [phone]
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
## 2019-01-22 NOTE — ED Notes (Signed)
Patient still very agitated, sweat soaking the bed, tachycardic and tachypnic. Patient unable to stay still to get accurate BP, MD notified that versed dose did not work.

## 2019-01-22 NOTE — ED Notes (Signed)
RN called Marni Griffon RN will call Treyvon Blahut RN back once [employer] is finished providing patient care.

## 2019-01-22 NOTE — ED Notes (Addendum)
MD notified that patient is not responding to ativan.

## 2019-01-22 NOTE — ED Notes (Addendum)
Report given to [facility] for [employer] 5W, Room 01.

## 2019-01-22 NOTE — Progress Notes (Signed)
Paged for admission of Vincent Clarke by Dr. Leonette Monarch at [facility]. Patient reportedly brought in by EMS as he was minimally responsive, febrile, found with possible illicit drug material. Patient has improved minimally, with ativan, versed and haldol as his presentation is consistent with likely stimulant overdose. Will need medical admission for monitoring and ruling out infectious causes.  IMTS to admit following transfer to [facility] granted our physician team is able to assess the patient within ~2 hours. Given the inability of our physician team to assess the patient at [facility], the patient will need to arrive at [facility] within that time frame and remain under the care of the ER physician until departure to prevent a lapse in care. We apologize for the inconvenience.   Thank you for the consult for admission.   Kathi Ludwig, MD [facility] Internal Medicine, PGY-3 Pager # [phone]

## 2019-01-22 NOTE — ED Notes (Signed)
[facility] has arrived to transport patient to [place], Room 01.
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
## 2019-01-22 NOTE — Progress Notes (Signed)
A consult was received from an ED physician for Vancomycin, cefepime per pharmacy dosing.  The patient's profile has been reviewed for ht/wt/allergies/indication/available labs.   A one time order has been placed for Vancomycin 1gm iv x1, and Cefepime 2gm iv x1.  Further antibiotics/pharmacy consults should be ordered by admitting physician if indicated.                       Thank you, Nani Skillern Crowford 01/22/2019  5:51 AM

## 2019-01-22 NOTE — ED Notes (Signed)
RN has just spoke to [place] MD regarding patient status on transferring to [facility].  Pager# [phone]

## 2019-01-22 NOTE — ED Notes (Signed)
Nurse Secreatary called bed placement with RN present and verified that bed placement is aware that patient is needing to be placed at [facility], not [facility].

## 2019-01-22 NOTE — Progress Notes (Signed)
Discussed admission status with patients nurse. Unfortunately, an order for admission and transfer was not placed at the time of signout to the admitting team.  We are unable to assess the patient at [facility]. As such, the patient will need to remain under the care of the ER physician until transfer. The patient will need to arrive at [facility] by noon today for evaluation by the IMTS team to facilitate a safe transfer. I apologize for the inconvenience.   Kathi Ludwig, MD [facility] Internal Medicine, PGY-3 Pager # [phone]

## 2019-01-23 DIAGNOSIS — Z79899 Other long term (current) drug therapy: Secondary | ICD-10-CM

## 2019-01-23 DIAGNOSIS — G9340 Encephalopathy, unspecified: Secondary | ICD-10-CM

## 2019-01-23 LAB — BASIC METABOLIC PANEL
Anion gap: 12 (ref 5–15)
BUN: 8 mg/dL (ref 6–20)
CO2: 21 mmol/L — ABNORMAL LOW (ref 22–32)
Calcium: 9.2 mg/dL (ref 8.9–10.3)
Chloride: 106 mmol/L (ref 98–111)
Creatinine, Ser: 0.59 mg/dL — ABNORMAL LOW (ref 0.61–1.24)
GFR calc Af Amer: >60 mL/min (ref >60)
GFR calc non Af Amer: >60 mL/min (ref >60)
Glucose, Bld: 89 mg/dL (ref 70–99)
Potassium: 3.1 mmol/L — ABNORMAL LOW (ref 3.5–5.1)
Sodium: 139 mmol/L (ref 135–145)

## 2019-01-23 LAB — CBC
HCT: 44.5 % (ref 39.0–52.0)
Hemoglobin: 15.4 g/dL (ref 13.0–17.0)
MCH: 30.2 pg (ref 26.0–34.0)
MCHC: 34.6 g/dL (ref 30.0–36.0)
MCV: 87.3 fL (ref 80.0–100.0)
Platelets: 306 10*3/uL (ref 150–400)
RBC: 5.1 MIL/uL (ref 4.22–5.81)
RDW: 12.3 % (ref 11.5–15.5)
WBC: 11 10*3/uL — ABNORMAL HIGH (ref 4.0–10.5)
nRBC: 0 % (ref 0.0–0.2)

## 2019-01-23 LAB — CK: Total CK: 502 U/L — ABNORMAL HIGH (ref 49–397)

## 2019-01-23 LAB — URINE CULTURE: Culture: NO GROWTH

## 2019-01-23 LAB — MAGNESIUM: Magnesium: 1.8 mg/dL (ref 1.7–2.4)

## 2019-01-23 LAB — PHOSPHORUS: Phosphorus: 3 mg/dL (ref 2.5–4.6)

## 2019-01-23 MED ORDER — ACETAMINOPHEN 650 MG RE SUPP: 325.0000 mg | Freq: Four times a day (QID) | RECTAL | Status: DC | PRN

## 2019-01-23 MED ORDER — INFLUENZA VAC SPLIT QUAD 0.5 ML IM SUSY
0.5000 mL | PREFILLED_SYRINGE | INTRAMUSCULAR | Status: AC
  Administered 2019-01-24: 0.5 mL via INTRAMUSCULAR
  Filled 2019-01-23: qty 0.5

## 2019-01-23 MED ORDER — POTASSIUM CHLORIDE 10 MEQ/100ML IV SOLN
10.0000 meq | INTRAVENOUS | Status: AC
  Administered 2019-01-23 (×6): 10 meq via INTRAVENOUS
  Filled 2019-01-23 (×6): qty 100

## 2019-01-23 NOTE — Progress Notes (Addendum)
Patient maintained in 4 pt restraints throughout shift.  He is quite confused and repetative with his speech pattern.  Withdrawal protocol has been upheld.  Safety is an issue with this patient as he has impulse control difficulties and unaware of his surroundings.  Ativan protocol followed as ordered, though as the shift progressed, it appeared to be less effective over-all.  Patient very restless at this point, attempting to bite his way out of restraints.  Wife called this Probation officer and explained that her husband attempted to end his life on Friday by taking "too much Fentanyl", which the patient denies.   She also states that he has not been taking Suboxon as prescribed, though did not elaborate.  Due to the fact that Ativan had been working earlier and making patient so somnolent, it was discussed with the MDs during am rounds re:  Holding Suboxon for the first 24 hours of admission, than re-starting in am.  Medication history is sketchy at best, but the common thread is the cumulative use over the years of multiple abusive substances.  Wife states patient has not been taking Suboxon, but he did request a re-fill from earlier in the month.  It is clear that the wife's knowledge of his medication as well as the healthy use of his prescribed medication is shrouded in doubt.

## 2019-01-23 NOTE — Progress Notes (Signed)
Received patient from Day Nurse at 730pm 01/22/2019.  Kept patient in 4 point non violent restraints throughout the night.  He periodically would get a little agitated and restless and would pull the restraints and require to be fixed up in the bed.  His Vital Signs remained stable.  I did not give any Ativan on night shift.  I was waiting to see if he came around and became  more responsive.  At the start of the shift he was not responsive to my voice.  Now he is responsive, he will answer me and open his eyes when I talk to him.  I was just checking his restraints and felt his skin is warm to touch.  I took his Axillary temp and it is creeping up.  HE was afebrile all night, right now he is 100.1.  I will pass this on to day nurse to watch his temp.  I will also put out page to as an FYI to MD.

## 2019-01-23 NOTE — Progress Notes (Signed)
   Subjective: Pt seen at the bedside on rounds this AM. Pt continues to be altered this AM. Still in restraints.   Objective:  Vital signs in last 24 hours: Vitals:   01/23/19 0500 01/23/19 0532 01/23/19 0632 01/23/19 0819  BP:  (!) 156/92  (!) 171/98  Pulse:  90  94  Resp:  14  (!) 21  Temp:  98 F (36.7 C) 100.1 F (37.8 C) 98.6 F (37 C)  TempSrc:  Axillary Axillary Axillary  SpO2:  97%  98%  Weight: 69.6 kg     Height:       Physical Exam: General: Laying in bed in restraints HEENT: NCAT, PERRLA CV: RRR, normal S1 and S2 no murmurs rubs or gallops appreciated PULM: Clear to auscultation bilaterally. No crackles or wheezes appreciated ABD: Nontender in all quadrants and nondistended NEURO: Not alert or oriented.  Minimal response to sternal rub.  Withdraws extremities to noxious stimuli.  Assessment/Plan:  Active Problems:   Altered behavior  In summary, Mr. Garis is a 53 year old male with a past medical history significant of HTN, asthma, PVD, GERD, Mallory-Weiss tears, dysphagia secondary to esophageal stenosis and strictures s/p several dilation procedures, PEG tube placement in February 2020, and polysubstance abuse who presented with suspected amphetamine overdose.  #Encepholopathy  #Suspected Amphetamine Overdose: UDS positive for amphetamines and opiates. Patient on buprenorphine at home. Salicylate, acetaminophen, and ethanol levels were unremarkable. - CIWA protocol, Ativan 1-4 mg every 6 hours PRN for agitation. Pt has received 4 mg Ativan since admission. - Thiamine 100 mg daily - Continue restraints and soft mittens - Continually reassess mental status. Consider head imaging if patient's mental status does not improve and not receiving sedating medications.   #Fever: Patient presented to EMS with a fever 101.2, leukocytosis to 11.5 with left shift and received one dose of vancomycin, cefepime, and Flagyl in the ED. Given the fact the patient was found  with white powder, a straw and blowtorch in his room and had a positive UDS for amphetamines, his fever and mild leukocytosis is likely a consequence of drug use as opposed to an infection.   - Discontinue antibiotics and monitor for fever  - Follow-up blood and urine cultures - Daily CBC & BMP  #History of Esophageal Strictures: Patient has an extensive history of strictures and multiple dilating procedures and recurrent scarring causing dysphagia. Patient had PEG tube placed in February of this year. - Protonix 40 mg BID  #FEN/GI - Diet: NPO - IVF: LR 100 cc/hr   #Dispo: Pending medical course.  Earlene Plater, MD Internal Medicine, PGY1 Pager: [phone]  01/23/2019,12:10 PM

## 2019-01-24 ENCOUNTER — Other Ambulatory Visit: Payer: Self-pay

## 2019-01-24 ENCOUNTER — Telehealth: Payer: Self-pay | Admitting: *Deleted

## 2019-01-24 DIAGNOSIS — G92 Toxic encephalopathy: Secondary | ICD-10-CM

## 2019-01-24 DIAGNOSIS — F1510 Other stimulant abuse, uncomplicated: Secondary | ICD-10-CM

## 2019-01-24 DIAGNOSIS — F1520 Other stimulant dependence, uncomplicated: Secondary | ICD-10-CM | POA: Diagnosis present

## 2019-01-24 LAB — CBC
HCT: 44.3 % (ref 39.0–52.0)
Hemoglobin: 15.4 g/dL (ref 13.0–17.0)
MCH: 30.6 pg (ref 26.0–34.0)
MCHC: 34.8 g/dL (ref 30.0–36.0)
MCV: 87.9 fL (ref 80.0–100.0)
Platelets: 305 10*3/uL (ref 150–400)
RBC: 5.04 MIL/uL (ref 4.22–5.81)
RDW: 12.2 % (ref 11.5–15.5)
WBC: 12.4 10*3/uL — ABNORMAL HIGH (ref 4.0–10.5)
nRBC: 0 % (ref 0.0–0.2)

## 2019-01-24 LAB — CK: Total CK: 196 U/L (ref 49–397)

## 2019-01-24 LAB — BASIC METABOLIC PANEL
Anion gap: 12 (ref 5–15)
BUN: 10 mg/dL (ref 6–20)
CO2: 21 mmol/L — ABNORMAL LOW (ref 22–32)
Calcium: 9 mg/dL (ref 8.9–10.3)
Chloride: 105 mmol/L (ref 98–111)
Creatinine, Ser: 0.62 mg/dL (ref 0.61–1.24)
GFR calc Af Amer: >60 mL/min (ref >60)
GFR calc non Af Amer: >60 mL/min (ref >60)
Glucose, Bld: 97 mg/dL (ref 70–99)
Potassium: 3.3 mmol/L — ABNORMAL LOW (ref 3.5–5.1)
Sodium: 138 mmol/L (ref 135–145)

## 2019-01-24 LAB — MRSA PCR SCREENING: MRSA by PCR: NEGATIVE

## 2019-01-24 LAB — MAGNESIUM: Magnesium: 1.8 mg/dL (ref 1.7–2.4)

## 2019-01-24 MED ORDER — MAGNESIUM SULFATE 2 GM/50ML IV SOLN
2.0000 g | Freq: Once | INTRAVENOUS | Status: AC
  Administered 2019-01-24: 2 g via INTRAVENOUS
  Filled 2019-01-24: qty 50

## 2019-01-24 MED ORDER — DEXTROSE IN LACTATED RINGERS 5 % IV SOLN
INTRAVENOUS | Status: DC
  Administered 2019-01-24 – 2019-01-25 (×2): via INTRAVENOUS

## 2019-01-24 MED ORDER — POTASSIUM CHLORIDE 10 MEQ/100ML IV SOLN
10.0000 meq | INTRAVENOUS | Status: AC
  Administered 2019-01-24: 10 meq via INTRAVENOUS
  Filled 2019-01-24: qty 100

## 2019-01-24 MED ORDER — LORAZEPAM 1 MG PO TABS
2.0000 mg | ORAL_TABLET | Freq: Three times a day (TID) | ORAL | Status: DC
  Administered 2019-01-24 – 2019-01-25 (×3): 2 mg via ORAL
  Filled 2019-01-24 (×3): qty 2

## 2019-01-24 MED ORDER — POTASSIUM CHLORIDE 10 MEQ/100ML IV SOLN
10.0000 meq | INTRAVENOUS | Status: DC
  Administered 2019-01-24: 10 meq via INTRAVENOUS
  Filled 2019-01-24: qty 100

## 2019-01-24 MED ORDER — AMLODIPINE BESYLATE 5 MG PO TABS
5.0000 mg | ORAL_TABLET | Freq: Every day | ORAL | Status: DC
  Administered 2019-01-24 – 2019-01-25 (×2): 5 mg via ORAL
  Filled 2019-01-24 (×2): qty 1

## 2019-01-24 NOTE — Progress Notes (Signed)
One on one suicide sitter at bedside at this time. Patient is resting quietly right now with his  eyes closed, respirations even and unlabored Patient was restless, agitated and was able to bite and removed all his 4 restrain. He pulled all his leads off afterwards.  Restrained reapplied and .Ativan 3 mg IV was administered per CIWA protocol.. Ativan was effective.Marland KitchenHe's A & O x 2 and arousable to engage in conversation Will continue to monitor.

## 2019-01-24 NOTE — Progress Notes (Signed)
MEWS/VS Documentation      01/24/2019 0701 01/24/2019 0804 01/24/2019 0811 01/24/2019 0830   MEWS Score:  1  4  3  2    MEWS Score Color:  Green  Red  Yellow  Yellow   Resp:  -  (!) 36  (!) 31  (!) 23   Pulse:  -  79  77  86   BP:  -  (!) 165/88  -  -   Temp:  -  98.4 F (36.9 C)  -  -   O2 Device:  -  Room Air  -  -    MEWS protocol was started. Patient respiration elevated; is not an acute change. MD aware. Will continue to monitor.

## 2019-01-24 NOTE — Progress Notes (Addendum)
Patient seen and assessed around 10:30pm, admitted 9/13 with AMS and concern for withdrawal and amphetamine overdose. Spoke with nurse at bedside who stated patient had been agitated and aggressive earlier in the evening with biting leads, attempting to get out of bed, not redirectable or coherent. He was given 3mg  ativan and symptoms are improved currently.  In the room patient is asleep, HR 90, O2 100% on RA, Resp 16, BP elevated to 170/73. He is arrousable to touch and states he is feeling better than he was earlier. He is oriented to place and self but falls asleep during conversation. He states he is here for withdrawal and currently feels like his withdrawal symptoms are controlled. He denies pain or SOB. Four point restraints were checked which are not excessively tight.  Day team unable to reach family but per chart review nursing was able to speak with wife earlier today who stated he attempted suicide with fentanyl. Patient continues to have AMS although this appears to be improving compared to previous.   - continue 1 to 1 precautions for agitation - staff is currently limited and no one at bedside - add suicide precautions for 1 to 1, will reassess in am - cont. Soft restraints and soft mittens - keep head of bed raised >30 degrees  - continue COWS q4h - CIWA q6h - cont. q1h checks when giving ativan   Elison Worrel A, DO 01/24/2019, 1:14 AM Pager: [phone]

## 2019-01-24 NOTE — Progress Notes (Addendum)
   Subjective: Pt seen at the bedside on rounds this AM. Pt continues to be altered this a.m. but is more responsive and answers questions.  The patient says he thinks he is on the hospital because of his esophagus.  He says that he took fentanyl recently but unable to provide details at this time.  Objective:  Vital signs in last 24 hours: Vitals:   01/24/19 0700 01/24/19 0804 01/24/19 0811 01/24/19 0830  BP:  (!) 165/88    Pulse: 91 79 77 86  Resp: 19 (!) 36 (!) 31 (!) 23  Temp:  98.4 F (36.9 C)    TempSrc:  Axillary    SpO2: 96% 94% 96% 97%  Weight:      Height:       Physical Exam: General: Laying in bed in restraints, tired appearing, NAD HEENT: NCAT, PERRLA CV: RRR, normal S1 and S2 no murmurs rubs or gallops appreciated PULM: Clear to auscultation bilaterally. No crackles or wheezes appreciated ABD: Nontender in all quadrants and nondistended. PEG tube dry and intact. NEURO: Alert and oriented to person, place but not month or year. Difficult to arouse but does awaken and answers some questions.  Otherwise, no focal deficits appreciated  Assessment/Plan:  Principal Problem:   Toxic encephalopathy Active Problems:   Opioid use disorder, moderate, in early remission (HCC)   S/P percutaneous endoscopic gastrostomy (PEG) tube placement (HCC)   Fever, unspecified   Amphetamine use disorder, severe ([place])   In summary, Vincent Clarke is a 53 year old male with a past medical history significant of HTN, asthma, PVD, GERD, Mallory-Weiss tears, dysphagia secondary to esophageal stenosis and strictures s/p several dilation procedures, PEG tube placement in February 2020, and polysubstance abuse who presented with suspected amphetamine overdose.   #Encepholopathy  #Suspected Amphetamine Overdose: UDS positive for amphetamines and opiates. Patient on buprenorphine at home but has not been able to take this during this hospitalization due to his AMS. Pt has received 14 mg Ativan in the  last 24 hours.  Last dose was received at 5:30 this morning. Will try to wean off Ativan to improve mental status.  - Ativan PO 2mg  q8 hours, dose extra IV as needed for agitation or withdrawal  Thiamine 100 mg daily  Continue restraints    #Fever: Patient presented to EMS with a fever 101.2, leukocytosis to 11.5 with left shift and received one dose of vancomycin, cefepime, and Flagyl in the ED. Given the fact the patient was found with white powder, a straw and blowtorch in his room and had a positive UDS for amphetamines, his fever and mild leukocytosis is likely a consequence of drug use as opposed to an infection.  Patient still has leukocytosis to 12.4 today.  Follow-up blood and urine cultures  Daily CBC & BMP   #History of Esophageal Strictures: Patient has an extensive history of strictures and multiple dilating procedures and recurrent scarring causing dysphagia. Patient had PEG tube placed in February of this year.  Protonix 40 mg BID   #FEN/GI  Diet: NPO  IVF: LR 100 cc/hr   #Dispo: Pending medical course.  Earlene Plater, MD Internal Medicine, PGY1 Pager: [phone]  01/24/2019,9:29 AM

## 2019-01-24 NOTE — Progress Notes (Signed)
Paged MD regarding Potassium level 3.3 with 6 runs Potassium ordered.  MD decreased to 2 runs Potassium.  Also notified MD of elevated BP.

## 2019-01-24 NOTE — Telephone Encounter (Signed)
Called patient for his virtual PV, no answer, left a message for him to call us back to reschedule the PV. Looks like he is in the hospital. I will leave the EGD on the schedule for 01/31/2019 for now. We will follow up to make sure he can have the procedure, Tia Alert, CMA also notified.

## 2019-01-24 NOTE — Progress Notes (Signed)
Patient's wife Caren Griffins called staff and left a phone number to be called back. Writer called patient's wife back but she was unavailable at that time. She said she will contact us later. Will continue to monitor.

## 2019-01-25 DIAGNOSIS — Z8719 Personal history of other diseases of the digestive system: Secondary | ICD-10-CM

## 2019-01-25 DIAGNOSIS — T43621A Poisoning by amphetamines, accidental (unintentional), initial encounter: Principal | ICD-10-CM

## 2019-01-25 LAB — BASIC METABOLIC PANEL
Anion gap: 10 (ref 5–15)
BUN: 9 mg/dL (ref 6–20)
CO2: 22 mmol/L (ref 22–32)
Calcium: 8.6 mg/dL — ABNORMAL LOW (ref 8.9–10.3)
Chloride: 106 mmol/L (ref 98–111)
Creatinine, Ser: 0.6 mg/dL — ABNORMAL LOW (ref 0.61–1.24)
GFR calc Af Amer: >60 mL/min (ref >60)
GFR calc non Af Amer: >60 mL/min (ref >60)
Glucose, Bld: 106 mg/dL — ABNORMAL HIGH (ref 70–99)
Potassium: 3.6 mmol/L (ref 3.5–5.1)
Sodium: 138 mmol/L (ref 135–145)

## 2019-01-25 LAB — CBC
HCT: 43 % (ref 39.0–52.0)
Hemoglobin: 14.8 g/dL (ref 13.0–17.0)
MCH: 30.3 pg (ref 26.0–34.0)
MCHC: 34.4 g/dL (ref 30.0–36.0)
MCV: 88.1 fL (ref 80.0–100.0)
Platelets: 293 10*3/uL (ref 150–400)
RBC: 4.88 MIL/uL (ref 4.22–5.81)
RDW: 12.2 % (ref 11.5–15.5)
WBC: 10.1 10*3/uL (ref 4.0–10.5)
nRBC: 0 % (ref 0.0–0.2)

## 2019-01-25 MED ORDER — PRO-STAT SUGAR FREE PO LIQD: 30.0000 mL | Freq: Two times a day (BID) | ORAL | Status: DC

## 2019-01-25 MED ORDER — JEVITY 1.5 CAL/FIBER PO LIQD
474.0000 mL | Freq: Three times a day (TID) | ORAL | Status: DC
  Filled 2019-01-25: qty 474

## 2019-01-25 MED ORDER — JEVITY 1.5 CAL/FIBER PO LIQD: 300.0000 mL | Freq: Three times a day (TID) | ORAL | Status: DC

## 2019-01-25 MED ORDER — JEVITY 1.5 CAL/FIBER PO LIQD
474.0000 mL | Freq: Three times a day (TID) | ORAL | Status: DC
  Filled 2019-01-25 (×2): qty 474

## 2019-01-25 NOTE — Progress Notes (Signed)
Initial Nutrition Assessment  DOCUMENTATION CODES:   Non-severe (moderate) malnutrition in context of chronic illness  INTERVENTION:   -Continue bolus feedings of 474 ml Jevity 1.5 QID  Tube feeding regimen provides 2130 kcal (97% of needs), 91 grams of protein, and 1080 ml of H2O.   NUTRITION DIAGNOSIS:   Moderate Malnutrition related to chronic illness(esophageal stenosis and strictures) as evidenced by mild fat depletion, moderate fat depletion, mild muscle depletion, moderate muscle depletion.  GOAL:   Patient will meet greater than or equal to 90% of their needs  MONITOR:   PO intake, Supplement acceptance, Labs, Weight trends, TF tolerance, Skin, I & O's  REASON FOR ASSESSMENT:   Malnutrition Screening Tool    ASSESSMENT:   Vincent Clarke is a 53 year old man with PMH of HTN, asthma, PVD, GERD, dysphagia and esophageal dysphagia who [facility] for possible amephetamine overdose.  He has been febrile, mostly nonresponsive to questions, pupils dilated.  CXR did not show sign of infection.  He was started on broad spectrum Abx.  Unfortunately, he was showing significant agitation and aggression in the ED and required sedative medications.  Pt admitted with AMS and amphetamine overdose.   Reviewed I/O's: -959 ml x 24 hours and -1.4 L since admission  UOP: 2.9 L x 24 hours  Pt sleepy at time of visit, however, reports feeling better today. He just consumed a diet coke prior to RD visit and confirms he receives nutrition via PO intake and PEG feedings.   History mainly deferred to pt's wife at bedside. Per pt wife, pt has been PEG dependent since February secondary to esophageal stenosis and strictures. 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 Pt has undergone some esophageal dilations with success; per pt wife, pt has been able to tolerate some oral  intake over the past month- mainly hamburgers and liquids such as soft drinks and milk.   Confirmed pt's home TF regimen with wife: pt receive 6 cans of Jevity 1.5 daily (typically administers 2 cans at a time). Pt also administers 60-120 ml free water with each feeding. Complete TF regimen provides 2130 kcals, 91 grams protein, and 1260-1440 ml free water daily, meeting 97% of estimated kcals and 87% of estimated protein needs daily.   Pt reports UBW is around 170#. She endorses wt loss prior to PEG placement, however, pt has steadily gained weight over the past month, since he started PO's again. Pt wife think he may have lost a few pounds due to decreased oral intake over the past few days. Per wt hx, pt with steady wt gain over the past 2 months.   Pt wife with no further questions and pt will likely discharge home later today per her report.   Labs reviewed: K: 3.3.   NUTRITION - FOCUSED PHYSICAL EXAM:    Most Recent Value  Orbital Region  Severe depletion  Upper Arm Region  Moderate depletion  Thoracic and Lumbar Region  Moderate depletion  Buccal Region  Moderate depletion  Temple Region  Severe depletion  Clavicle Bone Region  Mild depletion  Clavicle and Acromion Bone Region  Mild depletion  Scapular Bone Region  Mild depletion  Dorsal Hand  Mild depletion  Patellar Region  Moderate depletion  Anterior Thigh Region  Moderate depletion  Posterior Calf Region  Moderate depletion  Edema (RD Assessment)  None  Hair  Reviewed  Eyes  Reviewed  Mouth  Reviewed  Skin  Reviewed  Nails  Reviewed       Diet Order:   Diet Order            Diet regular Room service appropriate? Yes; Fluid consistency: Thin  Diet effective now        Diet - low sodium heart healthy              EDUCATION NEEDS:   No education needs have been identified at this time  Skin:  Skin Assessment: Reviewed RN Assessment  Last BM:  01/21/19  Height:   Ht Readings from Last 1 Encounters:   01/22/19 5\' 5"  (1.651 m)    Weight:   Wt Readings from Last 1 Encounters:  01/23/19 69.6 kg    Ideal Body Weight:  61.8 kg  BMI:  Body mass index is 25.53 kg/m.  Estimated Nutritional Needs:   Kcal:  2200-2400  Protein:  105-120 grams  Fluid:  > 2.2 L   Vincent Clarke, RD, LDN, [place] Registered Dietitian II Certified Diabetes Care and Education Specialist Pager: [phone] After hours Pager: [phone]

## 2019-01-25 NOTE — Progress Notes (Signed)
   Subjective: Pt seen at the bedside on rounds this AM. He is much more alert and oriented this morning. He stated that he did not recall the specific events leading up to his admission but denied this being an intent at self harm. He stated that he used the "regular" dose of fentanyl and does not know why this caused his symptoms. He is agreeable to discharge home today and having his diet advanced.   Objective:  Vital signs in last 24 hours: Vitals:   01/24/19 1602 01/24/19 2000 01/25/19 0000 01/25/19 0400  BP: 139/68 (!) 141/75 135/82 133/86  Pulse: 92 76 73 70  Resp: 15 20 (!) 22 16  Temp: 97.9 F (36.6 C) 97.8 F (36.6 C) 98.3 F (36.8 C) 98.3 F (36.8 C)  TempSrc: Axillary Axillary Oral Oral  SpO2: 94% 96% 94% 99%  Weight:      Height:       Physical Exam: General: Laying in bed in restraints, tired appearing, NAD HEENT: NCAT, PERRLA CV: RRR, normal S1 and S2 no murmurs rubs or gallops appreciated PULM: Clear to auscultation bilaterally. No crackles or wheezes appreciated ABD: Nontender in all quadrants and nondistended. PEG tube dry and intact. NEURO: Alert and oriented to person, place but not month or year. Difficult to arouse but does awaken and answers some questions.  Otherwise, no focal deficits appreciated  Assessment/Plan:  Principal Problem:   Toxic encephalopathy Active Problems:   Opioid use disorder, moderate, in early remission (HCC)   S/P percutaneous endoscopic gastrostomy (PEG) tube placement (HCC)   Fever, unspecified   Amphetamine use disorder, severe ([place])  In summary, Mr. Byers is a 53 year old male with a past medical history significant of HTN, asthma, PVD, GERD, Mallory-Weiss tears, dysphagia secondary to esophageal stenosis and strictures s/p several dilation procedures, PEG tube placement in February 2020, and polysubstance abuse who presented with suspected amphetamine overdose. He is stable today now off of PRN ativan and cleared for  discharge home.    A/P: #Encepholopathy  #Suspected Amphetamine Overdose:  Patient admitted for sympathomimetic intoxication syndrome.  Placed in a mild sedation to prevent fibrinolysis or self-harm likely amphetamine overdose.  Now recovering. -Discontinue Ativan -Thiamine 100 mg daily -Discontinue restraints -Discontinue RN placed suicide precaution orders as not indicated   #Fever:  Secondary to amphetamine intoxication.  No signs of localized or systemic infection.   #History of Esophageal Strictures:  Patient is been following with GI for his history esophageal stricture was have an appointment with him on 914/2020.  He will need to reschedule this appointment - Protonix 40 mg BID - Continue sucralfate -Follow-up with GI   #FEN/GI - Diet: NPO - IVF: LR 100 cc/hr   #Dispo: Today  Kathi Ludwig, MD [facility] Internal Medicine, PGY-3 Pager # [phone]

## 2019-01-25 NOTE — Progress Notes (Signed)
   Subjective: Patient seen at the bedside on rounds this morning.  Much more alert today.  Says he feels hungry and would like to go home.  He is amendable to following up in the outpatient setting.  Objective:  Vital signs in last 24 hours: Vitals:   01/24/19 2000 01/25/19 0000 01/25/19 0400 01/25/19 0753  BP: (!) 141/75 135/82 133/86 (!) 138/97  Pulse: 76 73 70   Resp: 20 (!) 22 16   Temp: 97.8 F (36.6 C) 98.3 F (36.8 C) 98.3 F (36.8 C) 97.8 F (36.6 C)  TempSrc: Axillary Oral Oral Oral  SpO2: 96% 94% 99%   Weight:      Height:       Physical exam: General: Resting in bed, NAD HEENT: NCAT CV: RRR, S1-S2 appreciated no murmurs rubs or gallops PU LM: Clear to auscultation bilaterally ABD: No tenderness to palpation in all 4 quadrants Neuro: Alert and oriented x4 answers questions appropriately.  No focal deficits.  Assessment/Plan:  Principal Problem:   Toxic encephalopathy Active Problems:   Opioid use disorder, moderate, in early remission (HCC)   S/P percutaneous endoscopic gastrostomy (PEG) tube placement (HCC)   Fever, unspecified   Amphetamine use disorder, severe ([place])  In summary,Mr. Grieder is a 53 year old male with a past medical history significant of HTN, asthma, PVD, GERD, Mallory-Weiss tears,dysphagia secondary to esophagealstenosis andstrictures s/p several dilation procedures,PEG tube placementin February 2020,and polysubstance abusewho presented with suspected amphetamine overdose.  The patient's mental status has returned to baseline and is medically ready for discharge.  #Encepholopathy  #SuspectedAmphetamineOverdose:UDS positive for amphetamines and opiates. Patient on buprenorphine at home but has not been able to take this during this hospitalization due to his AMS. Pt has received 14 mg Ativan in the last 24 hours.  Last dose was received at 5:30 this morning. Will try to wean off Ativan to improve mental status.  -Thiamine 100 mg  daily  #Fever:Patient presented to EMS with a fever 101.2, leukocytosis to 11.5 with left shift andreceived one dose ofvancomycin,cefepime,and Flagyl in the ED. Given the fact the patient was found with white powder, a straw and blowtorch in his room and had a positive UDS for amphetamines, his fever and mild leukocytosis is likely a consequence of drug use as opposed to an infection.Patient still has leukocytosis to 12.4 today. -Follow-up blood and urine cultures  #History ofEsophagealStrictures:Patient has an extensive history of strictures and multiple dilating procedures and recurrent scarring causing dysphagia. Patient had PEG tube placed in February of this year. -Protonix 40 mg BID  #FEN/GI -Diet: NPO -IVF: LR 100 cc/hr  #Dispo: Medically ready for discharge today.  Will have close follow-up in outpatient setting  Earlene Plater, MD Internal Medicine, PGY1 Pager: [phone]  01/25/2019,11:45 AM

## 2019-01-25 NOTE — Discharge Summary (Signed)
Name: Vincent Clarke MRN: [id number] DOB: 04/22/1966 53 y.o. PCP: Vincent PollGuilloud, Carolyn, MD  Date of Admission: 01/22/2019  3:18 AM Date of Discharge: 01/25/2019 Attending Physician: Vincent Clarke, Vincent Clarke, *  Discharge Diagnosis: 1. Amphetamine overdose 2. Fentanyl, IV injection drug use disorder 3. Esophageal stricture   Discharge Medications: Allergies as of 01/25/2019      Reactions   Cyclobenzaprine Swelling   Facial swelling   Tramadol Swelling      Medication List    TAKE these medications   Buprenorphine HCl-Naloxone HCl 8-2 MG Film Place 1 Film under the tongue 2 (two) times daily.   feeding supplement (JEVITY 1.5 CAL/FIBER) Liqd Place 300 mLs into feeding tube 4 (four) times daily. What changed:   how much to take  when to take this   feeding supplement (PRO-STAT SUGAR FREE 64) Liqd Place 30 mLs into feeding tube daily.   free water Soln Place 200 mLs into feeding tube 4 (four) times daily.   multivitamin with minerals Tabs tablet Take 1 tablet by mouth daily.   pantoprazole 40 MG tablet Commonly known as: PROTONIX Take 40 mg by mouth 2 (two) times daily.   sucralfate 1 GM/10ML suspension Commonly known as: CARAFATE Take 10 mLs (1 g total) by mouth 4 (four) times daily -  with meals and at bedtime for 30 days.       Disposition and follow-up:   [name] was [facility] St[facility] condition.  At the hospital follow up visit please address:  1.  A. Amphetamine overdose: Admitted with sympathomimetic syndrome felt to be due to amphetamine use, please discuss amphetamine cessation given his life threatening illness B. Fentanyl, IV injection drug use disorder: Please continue with the suboxone therapy, patient agreeable and appears appropriate for such by Dr. Oswaldo Clarke C. Esophageal stricture: Needs to follow-up with GI, missed appointment while admitted and will need to make sure he follows up with them  2.  Labs /  imaging needed at time of follow-up: Urine Toxicology  3.  Pending labs/ test needing follow-up: n/a  Follow-up Appointments:   Hospital Course by problem list: 1.  [name] is a 53 year old male with a past medical history notable for HTN, asthma, PVD, GERD, Mallory-Weiss tears, dysphagia secondary to esophageal stenosis and strictures s/p repeat dilations, PEG tube placement 07/29/2020 2/2 to the esophageal stricture, and polysubstance use disorder who presented for amphetamine overdose.  He was found by white powder substance per EMS with a story of being collaborated by his wife.  The sympathomimetic syndrome was treated with benzodiazepines to a state of mild sedation in order to prevent rhabdomyolysis or other severe personal self injury.  Patient improved rapidly over the 72-hour stay in the hospital ward.  Patient was discharged home in stable condition following recovery.  There was no admission nor evidence of suicidal ideation or intent despite the overdose.  Discharge Vitals:   BP (!) 138/97 (BP Location: Right Arm)   Pulse 70   Temp 97.8 F (36.6 C) (Oral)   Resp 16   Ht 5\' 5"  (1.651 m)   Wt 69.6 kg   SpO2 99%   BMI 25.53 kg/m   Pertinent Labs, Studies, and Procedures:  CBC Latest Ref Rng & Units 01/25/2019 01/24/2019 01/23/2019  WBC 4.0 - 10.5 K/uL 10.1 12.4(H) 11.0(H)  Hemoglobin 13.0 - 17.0 g/dL 40.914.8 81.115.4 91.415.4  Hematocrit 39.0 - 52.0 % 43.0 44.3 44.5  Platelets 150 - 400 K/uL 293  305 306   CMP Latest Ref Rng & Units 01/25/2019 01/24/2019 01/23/2019  Glucose 70 - 99 mg/dL 106(H) 97 89  BUN 6 - 20 mg/dL 9 10 8   Creatinine 0.61 - 1.24 mg/dL 0.60(L) 0.62 0.59(L)  Sodium 135 - 145 mmol/L 138 138 139  Potassium 3.5 - 5.1 mmol/L 3.6 3.3(L) 3.1(L)  Chloride 98 - 111 mmol/L 106 105 106  CO2 22 - 32 mmol/L 22 21(L) 21(L)  Calcium 8.9 - 10.3 mg/dL 8.6(L) 9.0 9.2  Total Protein 6.5 - 8.1 g/dL - - -  Total Bilirubin 0.3 - 1.2 mg/dL - - -  Alkaline Phos 38 - 126 U/L - - -  AST 15  - 41 U/L - - -  ALT 0 - 44 U/L - - -    Discharge Instructions: Discharge Instructions    Diet - low sodium heart healthy   Complete by: As directed    Discharge instructions   Complete by: As directed    Please continue to follow in the suboxone clinic. You have an appointment scheduled.   Increase activity slowly   Complete by: As directed       Signed: Kathi Ludwig, MD 01/25/2019, 11:34 AM   Pager: # [phone]

## 2019-01-25 NOTE — Progress Notes (Signed)
Nsg Discharge Note  Admit Date:  01/22/2019 Discharge date: 01/25/2019   Saverio Danker to be D/C'd Home per MD order.  AVS completed.  Copy for chart, and copy for patient signed, and dated. Patient/caregiver able to verbalize understanding.  Discharge Medication: Allergies as of 01/25/2019      Reactions   Cyclobenzaprine Swelling   Facial swelling   Tramadol Swelling      Medication List    TAKE these medications   Buprenorphine HCl-Naloxone HCl 8-2 MG Film Place 1 Film under the tongue 2 (two) times daily.   feeding supplement (JEVITY 1.5 CAL/FIBER) Liqd Place 300 mLs into feeding tube 4 (four) times daily. What changed:   how much to take  when to take this   feeding supplement (PRO-STAT SUGAR FREE 64) Liqd Place 30 mLs into feeding tube daily.   free water Soln Place 200 mLs into feeding tube 4 (four) times daily.   multivitamin with minerals Tabs tablet Take 1 tablet by mouth daily.   pantoprazole 40 MG tablet Commonly known as: PROTONIX Take 40 mg by mouth 2 (two) times daily.   sucralfate 1 GM/10ML suspension Commonly known as: CARAFATE Take 10 mLs (1 g total) by mouth 4 (four) times daily -  with meals and at bedtime for 30 days.       Discharge Assessment: Vitals:   01/25/19 0400 01/25/19 0753  BP: 133/86 (!) 138/97  Pulse: 70   Resp: 16   Temp: 98.3 F (36.8 C) 97.8 F (36.6 C)  SpO2: 99%    Skin clean, dry and intact without evidence of skin break down, no evidence of skin tears noted. Foley was discontinued prior to discharge. Pt able to void w/o any difficulty prior to leaving. IV catheter discontinued intact. Site without signs and symptoms of complications - no redness or edema noted at insertion site, patient denies c/o pain - only slight tenderness at site.  Dressing with slight pressure applied.  D/c Instructions-Education: Discharge instructions given to patient/family with verbalized understanding. D/c education completed with  patient/family including follow up instructions, medication list, d/c activities limitations if indicated, with other d/c instructions as indicated by MD - patient able to verbalize understanding, all questions fully answered. Patient instructed to return to ED, call 911, or call MD for any changes in condition.  Patient escorted via [place], and D/C home via private auto.  Walker Shadow, RN 01/25/2019 11:41 AM

## 2019-01-26 ENCOUNTER — Telehealth: Payer: Self-pay

## 2019-01-26 NOTE — Telephone Encounter (Signed)
-----   Message from Yetta Flock, MD sent at 01/25/2019  5:34 PM EDT ----- Regarding: RE: EGD scheduled for 9-21 Yes okay to proceed ----- Message ----- From: Roetta Sessions, CMA Sent: 01/25/2019   4:53 PM EDT To: Yetta Flock, MD Subject: EGD scheduled for 9-21                         Pt is scheduled for EGD on Monday the 21st. He was discharged today. Please review and let me know if you think we should reschedule his procedure or if you think he is OK to have the procedure on Monday if he is still agreeable. He missed his Previsit so I need to call him to get him in this week asap to be instructed. Thanks, Jan     ----- Message ----- From: Levonne Spiller, RN Sent: 01/24/2019  10:37 AM EDT To: Roetta Sessions, CMA  Patient is in the hospital. Virtual PV NO SHOW today, PV cancelled. EGD is still on the schedule for 01/31/2019 with Dr.Armbruster.  thanks!!!

## 2019-01-26 NOTE — Telephone Encounter (Signed)
Called and LM for pt to call back to discuss EGD procedure for Monday 9-21 at 10:00am, to arrive at 9:00am, no solids after midnight and NPO after 7:00am.  Asked him to call back to confirm. He was offered to come in to be instructed or have his instructions mailed to him as he has had this procedure several times per [employer]

## 2019-01-27 LAB — CULTURE, BLOOD (ROUTINE X 2)
Culture: NO GROWTH
Culture: NO GROWTH
Special Requests: ADEQUATE
Special Requests: ADEQUATE

## 2019-01-27 NOTE — Telephone Encounter (Signed)
Called and spoke to pt.  He is still planning on having his procedure on Monday.  He will try to stop by and sign the consent today or tomorrow.  Mailing instructions.

## 2019-01-31 ENCOUNTER — Other Ambulatory Visit: Payer: Self-pay

## 2019-01-31 ENCOUNTER — Ambulatory Visit (AMBULATORY_SURGERY_CENTER): Payer: Medicaid Other | Admitting: Gastroenterology

## 2019-01-31 ENCOUNTER — Encounter: Payer: Self-pay | Admitting: Gastroenterology

## 2019-01-31 VITALS — BP 115/74 | HR 67 | Temp 98.1°F | Resp 16 | Ht 65.0 in | Wt 155.0 lb

## 2019-01-31 DIAGNOSIS — K222 Esophageal obstruction: Secondary | ICD-10-CM

## 2019-01-31 DIAGNOSIS — R1310 Dysphagia, unspecified: Secondary | ICD-10-CM | POA: Diagnosis not present

## 2019-01-31 DIAGNOSIS — K449 Diaphragmatic hernia without obstruction or gangrene: Secondary | ICD-10-CM | POA: Diagnosis not present

## 2019-01-31 MED ORDER — SODIUM CHLORIDE 0.9 % IV SOLN: 500.0000 mL | Freq: Once | INTRAVENOUS | Status: DC

## 2019-01-31 NOTE — Progress Notes (Signed)
A and O x3. Report to RN. Tolerated MAC anesthesia well.Gums unchanged after procedure. 

## 2019-01-31 NOTE — Op Note (Signed)
[facility] Patient Name: Vincent Clarke Procedure Date: 01/31/2019 10:11 AM MRN: [id number] Endoscopist: Viviann SpareSteven P. Adela LankArmbruster , MD Age: 53 Referring MD:  Date of Birth: 06/30/1965 Gender: Male Account #: [id number] Procedure:                Upper GI endoscopy Indications:              For therapy of esophageal stricture - refractory                            [facility],                            refractory despite protonix 80mg  BID, had gone up                            to 14mm Savary last dilatin with good result,                            patient endorses worsening dysphagia. Multiple EGDs                            with Savary and prior kenologue injection with                            continued stricturing Medicines:                Monitored Anesthesia Care Procedure:                Pre-Anesthesia Assessment:                           - Prior to the procedure, a History and Physical                            was performed, and patient medications and                            allergies were reviewed. The patient's tolerance of                            previous anesthesia was also reviewed. The risks                            and benefits of the procedure and the sedation                            options and risks were discussed with the patient.                            All questions were answered, and informed consent                            was obtained. Prior Anticoagulants: The patient has  taken no previous anticoagulant or antiplatelet                            agents. ASA Grade Assessment: II - A patient with                            mild systemic disease. After reviewing the risks                            and benefits, the patient was deemed in                            satisfactory condition to undergo the procedure.                           After obtaining informed consent, the  endoscope was                            passed under direct vision. Throughout the                            procedure, the patient's blood pressure, pulse, and                            oxygen saturations were monitored continuously. The                            Endoscope was introduced through the mouth, and                            advanced to the body of the stomach. The upper GI                            endoscopy was accomplished without difficulty. The                            patient tolerated the procedure well. Scope In: Scope Out: Findings:                 Esophagogastric landmarks were identified: the                            gastroesophageal junction was found at 38 cm and                            the upper extent of the gastric folds was found at                            40 cm from the incisors.                           A 2 cm hiatal hernia was present.  Barrett's esophagus was present in the lower third                            of the esophagus. The maximum longitudinal extent                            of these mucosal changes was 3 cm in length.                            Previously biopsied and no dysplasia.                           One benign-appearing, intrinsic severe stenosis was                            found 30 cm from the incisors. This stenosis                            measured 4 cm (in length). The stenosis had                            severely strictured down since the last exam, was                            only able to traversed after dilation. There was an                            impacted pill / retained food in the stricture                            which was removed with a Roth net. A guidewire was                            placed and the scope was withdrawn. Dilation was                            performed with a Savary dilator with moderate                            resistance at 11 mm at which  time the endoscope was                            able to pass. Then repeat dilation was done with 12                            mm and 13 mm Savary with moderate resistance and                            multiple wrents noted at the site. No further                            dilation performed.  The exam of the esophagus was otherwise normal.                           There was evidence of a gastrostomy present in the                            gastric body.                           The exam of the stomach was otherwise normal.                            Duodenum not examined given multiple prior normal                            exams. Complications:            No immediate complications. Estimated blood loss:                            Minimal. Estimated Blood Loss:     Estimated blood loss was minimal. Impression:               - Esophagogastric landmarks identified.                           - 2 cm hiatal hernia.                           - Barrett's esophagus - not biopsied, appears                            stable.                           - Benign-appearing esophageal stenosis - severely                            strictured back down over the past 2 weeks. Dilated                            back to 68mm.                           - Gastrostomy present.                           - No specimens collected.                           This stricture has been refractory and difficult to                            open to lumen amenable to tolerate solid foods                            despite multiple dilations and kenologue injection                            /  high dose PPI. Recommend the patient schedule a                            follow up with [facility]. I will discuss with my advanced                            endoscopy colleagues possibility for stenting this                            stricture. Would otherwise shorten interval for his                             next dilation to one week to see if that helps. Recommendation:           - Patient has a contact number available for                            emergencies. The signs and symptoms of potential                            delayed complications were discussed with the                            patient. Return to normal activities tomorrow.                            Written discharge instructions were provided to the                            patient.                           - Full liquid diet.                           - Continue present medications.                           - Repeat EGD in one week as above Viviann Spare P. Jalei Shibley, MD 01/31/2019 10:50:37 AM This report has been signed electronically.

## 2019-01-31 NOTE — Progress Notes (Signed)
Temperature taken by K.A., VS taken by C.W. 

## 2019-01-31 NOTE — Patient Instructions (Signed)
Please read handouts provided. Full Liquid diet. Repeat EGD in one week, Tues. Sept. 29 at 4:30 pm.       YOU HAD AN ENDOSCOPIC PROCEDURE TODAY AT [facility]:   Refer to the procedure report that was given to you for any specific questions about what was found during the examination.  If the procedure report does not answer your questions, please call your gastroenterologist to clarify.  If you requested that your care partner not be given the details of your procedure findings, then the procedure report has been included in a sealed envelope for you to review at your convenience later.  YOU SHOULD EXPECT: Some feelings of bloating in the abdomen. Passage of more gas than usual.  Walking can help get rid of the air that was put into your GI tract during the procedure and reduce the bloating. If you had a lower endoscopy (such as a colonoscopy or flexible sigmoidoscopy) you may notice spotting of blood in your stool or on the toilet paper. If you underwent a bowel prep for your procedure, you may not have a normal bowel movement for a few days.  Please Note:  You might notice some irritation and congestion in your nose or some drainage.  This is from the oxygen used during your procedure.  There is no need for concern and it should clear up in a day or so.  SYMPTOMS TO REPORT IMMEDIATELY:    Following upper endoscopy (EGD)  Vomiting of blood or coffee ground material  New chest pain or pain under the shoulder blades  Painful or persistently difficult swallowing  New shortness of breath  Fever of 100F or higher  Black, tarry-looking stools  For urgent or emergent issues, a gastroenterologist can be reached at any hour by calling [phone].   DIET:  We do recommend a small meal at first, but then you may proceed to your regular diet.  Drink plenty of fluids but you should avoid alcoholic beverages for 24 hours.  ACTIVITY:  You should plan to take it easy for the  rest of today and you should NOT DRIVE or use heavy machinery until tomorrow (because of the sedation medicines used during the test).    FOLLOW UP: Our staff will call the number listed on your records 48-72 hours following your procedure to check on you and address any questions or concerns that you may have regarding the information given to you following your procedure. If we do not reach you, we will leave a message.  We will attempt to reach you two times.  During this call, we will ask if you have developed any symptoms of COVID 19. If you develop any symptoms (ie: fever, flu-like symptoms, shortness of breath, cough etc.) before then, please call [phone].  If you test positive for Covid 19 in the 2 weeks post procedure, please call and report this information to [place].    If any biopsies were taken you will be contacted by phone or by letter within the next 1-3 weeks.  Please call us at [phone] if you have not heard about the biopsies in 3 weeks.    SIGNATURES/CONFIDENTIALITY: You and/or your care partner have signed paperwork which will be entered into your electronic medical record.  These signatures attest to the fact that that the information above on your After Visit Summary has been reviewed and is understood.  Full responsibility of the confidentiality of this discharge information lies with you and/or  your care-partner. 

## 2019-01-31 NOTE — Progress Notes (Signed)
Called to room to assist during endoscopic procedure.  Patient ID and intended procedure confirmed with present staff. Received instructions for my participation in the procedure from the performing physician.  

## 2019-02-02 ENCOUNTER — Telehealth: Payer: Self-pay

## 2019-02-02 ENCOUNTER — Encounter: Payer: Self-pay | Admitting: Internal Medicine

## 2019-02-02 NOTE — Telephone Encounter (Signed)
  Follow up Call-  Call back number 01/31/2019 01/14/2019 12/23/2018 12/06/2018 11/19/2018  Post procedure Call Back phone  # [phone]  Permission to leave phone message Yes Yes Yes Yes Yes  Some recent data might be hidden     Patient questions:  Do you have a fever, pain , or abdominal swelling? No. Pain Score  0 *  Have you tolerated food without any problems? Yes.    Have you been able to return to your normal activities? Yes.    Do you have any questions about your discharge instructions: Diet   No. Medications  No. Follow up visit  No.  Do you have questions or concerns about your Care? No.  Actions: * If pain score is 4 or above: No action needed, pain <4.  1. Have you developed a fever since your procedure? no  2.   Have you had an respiratory symptoms (SOB or cough) since your procedure? no  3.   Have you tested positive for COVID 19 since your procedure no  4.   Have you had any family members/close contacts diagnosed with the COVID 19 since your procedure?  no   If yes to any of these questions please route to Joylene John, RN and Alphonsa Gin, Therapist, sports.

## 2019-02-07 ENCOUNTER — Telehealth: Payer: Self-pay

## 2019-02-07 NOTE — Telephone Encounter (Signed)
Covid-19 screening questions   Do you now or have you had a fever in the last 14 days? NO   Do you have any respiratory symptoms of shortness of breath or cough now or in the last 14 days? NO  Do you have any family members or close contacts with diagnosed or suspected Covid-19 in the past 14 days? NO  Have you been tested for Covid-19 and found to be positive? NO        

## 2019-02-08 ENCOUNTER — Encounter: Payer: Medicaid Other | Admitting: Gastroenterology

## 2019-02-08 ENCOUNTER — Ambulatory Visit (INDEPENDENT_AMBULATORY_CARE_PROVIDER_SITE_OTHER): Payer: Medicaid Other | Admitting: Student in an Organized Health Care Education/Training Program

## 2019-02-08 ENCOUNTER — Other Ambulatory Visit: Payer: Self-pay

## 2019-02-08 VITALS — BP 133/74 | HR 73 | Temp 98.4°F | Wt 155.0 lb

## 2019-02-08 DIAGNOSIS — F1120 Opioid dependence, uncomplicated: Secondary | ICD-10-CM

## 2019-02-08 DIAGNOSIS — F439 Reaction to severe stress, unspecified: Secondary | ICD-10-CM

## 2019-02-08 DIAGNOSIS — K222 Esophageal obstruction: Secondary | ICD-10-CM | POA: Diagnosis not present

## 2019-02-08 DIAGNOSIS — Z23 Encounter for immunization: Secondary | ICD-10-CM

## 2019-02-08 DIAGNOSIS — R1310 Dysphagia, unspecified: Secondary | ICD-10-CM | POA: Diagnosis not present

## 2019-02-08 DIAGNOSIS — Z931 Gastrostomy status: Secondary | ICD-10-CM | POA: Diagnosis not present

## 2019-02-08 DIAGNOSIS — F1121 Opioid dependence, in remission: Secondary | ICD-10-CM

## 2019-02-08 MED ORDER — BUPRENORPHINE HCL-NALOXONE HCL 8-2 MG SL FILM: 1.0000 | ORAL_FILM | Freq: Three times a day (TID) | SUBLINGUAL | 0 refills | Status: DC

## 2019-02-08 NOTE — Assessment & Plan Note (Signed)
Chronic issue, recent exacerbation with two relapses in the last few weeks.  One relapse leading to hospitalization for amphetamine overdose.  Trigger for relapse is stress related to his esophageal stricture and medical complications.  This has dramatically impacted his quality of life.  When his stress level increases his cravings to increase and he is prone to make impulsive choices.  We talked about the importance of maintaining on the Suboxone to prevent overdose.  Plan is to increase Suboxone to 8 mg 3 times daily to try to improve control of the cravings.  Checking urine tox assure today, as I want to ensure compliance with the Suboxone.  Follow-up in 4 weeks.  I offered him counseling with Miquel Dunn, he is going to think about this and will let us know.

## 2019-02-08 NOTE — Patient Instructions (Signed)
Today we talked about your opioid use disorder given recent relapses and your cravings, we are going to increase the Suboxone to 3 films daily.  We talked about the struggles you are having with your esophageal stricture.  Follow-up with your GI physician.  We talked about coping with the problems that you are having.  The stress you are under it makes at high risk for having relapses.  If you want to talk with our counselor, please give [place] a call and we can arrange for that.

## 2019-02-08 NOTE — Progress Notes (Signed)
   02/08/2019  Vincent Clarke presents for follow up of opioid use disorder I have reviewed the prior induction visit, follow up visits, and telephone encounters relevant to opiate use disorder (OUD) treatment.   Current daily dose: Suboxone 8 mg twice daily  Date of Induction: 05/14/18  Current follow up interval, in weeks: 4  The patient has been adherent with the buprenorphine for OUD contract.    HPI: 53 year old man here for follow-up of opioid use disorder.  His treatment is complicated by severe esophageal stenosis which has been difficult to treat.  This is being managed by Dr. Havery [name] with GI.  He has a percutaneous gastric tube in place through which he does feedings with Jevity and free water.  He has had a return of symptoms and has difficulty passing any food or water right now.  He has a endoscopy procedure planned for today at 330 to do another dilation.  He is really struggling with all of the disability that comes with this medical problem.  He feels trapped in his house.  He has been unable to work.  Eating causes him pain.  He really wants to be able to eat again.  This causes a lot of stress.  He snorted heroin a few weeks ago, and then became confused and altered.  He was hospitalized for acute intoxication with amphetamines which was likely mixed into the heroin he used.  He was treated with benzodiazepines, had return of normal mentation though he had no memory of the events.  Discharged home, had 1 more relapse about 1 week ago and snorted heroin.  He reports good compliance with Suboxone twice daily.  No injecting.  No fevers or chills.   Exam:   Vitals:   02/08/19 1001  BP: 133/74  Pulse: 73  Temp: 98.4 F (36.9 C)  TempSrc: Oral  SpO2: 99%    General: No acute distress Abd: Soft, nontender, percutaneous gastric tube is in place at the mid epigastrium, skin is clean with no signs of infection CV: Regular rate and rhythm with no murmurs Neuro: Alert, oriented,  conversational, normal strength upper and lower extremities Psych: Tearful, depressed appearing  Assessment/Plan:  See Problem Based Charting in the Encounters Tab     Axel Filler, MD  02/08/2019  11:25 AM

## 2019-02-09 ENCOUNTER — Telehealth: Payer: Self-pay | Admitting: Gastroenterology

## 2019-02-09 NOTE — Telephone Encounter (Signed)

## 2019-02-10 ENCOUNTER — Encounter: Payer: Self-pay | Admitting: Gastroenterology

## 2019-02-10 ENCOUNTER — Ambulatory Visit (AMBULATORY_SURGERY_CENTER): Payer: Medicaid Other | Admitting: Gastroenterology

## 2019-02-10 ENCOUNTER — Other Ambulatory Visit: Payer: Self-pay

## 2019-02-10 VITALS — BP 111/70 | HR 62 | Temp 99.1°F | Resp 20 | Ht 65.0 in | Wt 155.0 lb

## 2019-02-10 DIAGNOSIS — K449 Diaphragmatic hernia without obstruction or gangrene: Secondary | ICD-10-CM

## 2019-02-10 DIAGNOSIS — K222 Esophageal obstruction: Secondary | ICD-10-CM | POA: Diagnosis not present

## 2019-02-10 DIAGNOSIS — K219 Gastro-esophageal reflux disease without esophagitis: Secondary | ICD-10-CM | POA: Diagnosis not present

## 2019-02-10 DIAGNOSIS — R1310 Dysphagia, unspecified: Secondary | ICD-10-CM | POA: Diagnosis not present

## 2019-02-10 DIAGNOSIS — K2270 Barrett's esophagus without dysplasia: Secondary | ICD-10-CM

## 2019-02-10 DIAGNOSIS — K209 Esophagitis, unspecified without bleeding: Secondary | ICD-10-CM | POA: Diagnosis not present

## 2019-02-10 MED ORDER — SODIUM CHLORIDE 0.9 % IV SOLN: 500.0000 mL | Freq: Once | INTRAVENOUS | Status: DC

## 2019-02-10 NOTE — Progress Notes (Signed)
Pt's states no medical or surgical changes since previsit or office visit.  Vitals CW Temp LC 

## 2019-02-10 NOTE — Patient Instructions (Signed)
Information on strictures and esophagitis given to you today.  Full liquid diet.  YOU HAD AN ENDOSCOPIC PROCEDURE TODAY AT [facility]:   Refer to the procedure report that was given to you for any specific questions about what was found during the examination.  If the procedure report does not answer your questions, please call your gastroenterologist to clarify.  If you requested that your care partner not be given the details of your procedure findings, then the procedure report has been included in a sealed envelope for you to review at your convenience later.  YOU SHOULD EXPECT: Some feelings of bloating in the abdomen. Passage of more gas than usual.  Walking can help get rid of the air that was put into your GI tract during the procedure and reduce the bloating. If you had a lower endoscopy (such as a colonoscopy or flexible sigmoidoscopy) you may notice spotting of blood in your stool or on the toilet paper. If you underwent a bowel prep for your procedure, you may not have a normal bowel movement for a few days.  Please Note:  You might notice some irritation and congestion in your nose or some drainage.  This is from the oxygen used during your procedure.  There is no need for concern and it should clear up in a day or so.  SYMPTOMS TO REPORT IMMEDIATELY:     Following upper endoscopy (EGD)  Vomiting of blood or coffee ground material  New chest pain or pain under the shoulder blades  Painful or persistently difficult swallowing  New shortness of breath  Fever of 100F or higher  Black, tarry-looking stools  For urgent or emergent issues, a gastroenterologist can be reached at any hour by calling [phone].   DIET:  We do recommend a small meal at first, but then you may proceed to your regular diet.  Drink plenty of fluids but you should avoid alcoholic beverages for 24 hours.  ACTIVITY:  You should plan to take it easy for the rest of today and you should  NOT DRIVE or use heavy machinery until tomorrow (because of the sedation medicines used during the test).    FOLLOW UP: Our staff will call the number listed on your records 48-72 hours following your procedure to check on you and address any questions or concerns that you may have regarding the information given to you following your procedure. If we do not reach you, we will leave a message.  We will attempt to reach you two times.  During this call, we will ask if you have developed any symptoms of COVID 19. If you develop any symptoms (ie: fever, flu-like symptoms, shortness of breath, cough etc.) before then, please call [phone].  If you test positive for Covid 19 in the 2 weeks post procedure, please call and report this information to [place].    If any biopsies were taken you will be contacted by phone or by letter within the next 1-3 weeks.  Please call us at [phone] if you have not heard about the biopsies in 3 weeks.    SIGNATURES/CONFIDENTIALITY: You and/or your care partner have signed paperwork which will be entered into your electronic medical record.  These signatures attest to the fact that that the information above on your After Visit Summary has been reviewed and is understood.  Full responsibility of the confidentiality of this discharge information lies with you and/or your care-partner.

## 2019-02-10 NOTE — Op Note (Signed)
[facility] Patient Name: Vincent Clarke Procedure Date: 02/10/2019 4:23 PM MRN: [id number] Endoscopist: Remo Lipps P. Havery [name] , MD Age: 53 Referring MD:  Date of Birth: 11-09-1965 Gender: Male Account #: [id number] Procedure:                Upper GI endoscopy Indications:              For therapy of refractory esophageal stricture -                            s/p needle knife at [facility] to open it initially, PEG                            in place for nutrition, multiple Savary dilations                            every 2 weeks with history of Kenologue injection,                            have not been able to progress further than 45mm.                            Last exam had worsening regression following 2                            weeks after dilation, was redilated up to 81mm.                            Interval shortened for his next dilation, awaiting                            follow up at [facility]. Medicines:                Monitored Anesthesia Care Procedure:                Pre-Anesthesia Assessment:                           - Prior to the procedure, a History and Physical                            was performed, and patient medications and                            allergies were reviewed. The patient's tolerance of                            previous anesthesia was also reviewed. The risks                            and benefits of the procedure and the sedation                            options and risks were discussed with the patient.  All questions were answered, and informed consent                            was obtained. Prior Anticoagulants: The patient has                            taken no previous anticoagulant or antiplatelet                            agents. ASA Grade Assessment: II - A patient with                            mild systemic disease. After reviewing the risks                            and benefits, the patient  was deemed in                            satisfactory condition to undergo the procedure.                           After obtaining informed consent, the endoscope was                            passed under direct vision. Throughout the                            procedure, the patient's blood pressure, pulse, and                            oxygen saturations were monitored continuously. The                            Endoscope was introduced through the mouth, and                            advanced to the antrum of the stomach. The upper GI                            endoscopy was accomplished without difficulty. The                            patient tolerated the procedure well. Scope In: Scope Out: Findings:                 Esophagogastric landmarks were identified: the                            gastroesophageal junction was found at 38 cm and                            the upper extent of the gastric folds was found at  40 cm from the incisors.                           A 2 cm hiatal hernia was present.                           Barrett's esophagus was present in the lower third                            of the esophagus. The maximum longitudinal extent                            of these mucosal changes was 2-3 cm in length.                            Previously biopsied so not biopsied before.                           One benign-appearing, intrinsic stenosis was found                            30 to 34 cm from the incisors. This stenosis                            measured 3-4 cm (in length). It had restrictured                            severely since the last dilation 10 days ago,                            however the endoscope was able to traverse it and                            dilated it itself. A guidewire was placed and the                            scope was withdrawn. Dilation was performed with a                            Savary  dilator with mild resistance at 11 mm, 12 mm                            and 14 mm. Relook endoscopy showed multiple mucosal                            wrents.                           The exam of the esophagus was otherwise normal.                           There was evidence of a gastrostomy present in the  gastric body.                           The exam of the stomach was otherwise normal.                            Duodenum not examined given it has been examined on                            prior endoscopies and normal. Complications:            No immediate complications. Estimated blood loss:                            Minimal. Estimated Blood Loss:     Estimated blood loss was minimal. Impression:               - Esophagogastric landmarks identified.                           - 2 cm hiatal hernia.                           - Stable appearing Barrett's esophagus inferior to                            the esophageal stricture.                           - Benign-appearing esophageal stenosis - it has                            restenosed since the last dilation 10 days ago.                            Dilated again up to 14mm with multiple appropriate                            mucosal wrents.                           - Gastrostomy present.                           Overall, the stricture has been refractory to                            multiple Savary dilations and kenologue injection,                            has been quick to scar down after 10-14 days post                            dilation. Given his course in recent months with  inability to progress beyond 13-31mm over the past                            several weeks, he needs to be evaluated for                            possible stenting for this. I don't think continued                            Savary dilations at this point is likely to keep                             him open at a luminal diameter that will allow him                            to eat without restricturing. I will discuss this                            case with my advanced endoscopy colleagues to see                            if they are willing to do this locally. If not, he                            should also follow up with [facility] - Dr. Edyth Gunnels, who                            has previously treated him. Recommendation:           - Patient has a contact number available for                            emergencies. The signs and symptoms of potential                            delayed complications were discussed with the                            patient. Return to normal activities tomorrow.                            Written discharge instructions were provided to the                            patient.                           - Full liquid diet.                           - Continue present medications.                           - I will be  in touch in the next week with further                            recommendations as outlined above. Viviann Spare P. Breeley Bischof, MD 02/10/2019 5:02:31 PM This report has been signed electronically.

## 2019-02-10 NOTE — Progress Notes (Signed)
Called to room to assist during endoscopic procedure.  Patient ID and intended procedure confirmed with present staff. Received instructions for my participation in the procedure from the performing physician.  

## 2019-02-10 NOTE — Progress Notes (Signed)
To pacu, VSS. Report to Rn.tb 

## 2019-02-11 MED FILL — PANTOPRAZOLE SOD DR 40 MG T: 40 | 15 days supply | Qty: 60 | Fill #2

## 2019-02-13 LAB — TOXASSURE SELECT,+ANTIDEPR,UR

## 2019-02-14 ENCOUNTER — Telehealth: Payer: Self-pay

## 2019-02-14 ENCOUNTER — Telehealth: Payer: Self-pay | Admitting: Internal Medicine

## 2019-02-14 NOTE — Telephone Encounter (Signed)
Left message on follow up call. 

## 2019-02-14 NOTE — Telephone Encounter (Signed)
Pt is having a hard time getting his suboxene ,pls contact [place], [place] - [address] [facility].

## 2019-02-14 NOTE — Telephone Encounter (Signed)
Medicaid is asking for PA due to dr Evette Doffing increasing med, it is very well documented by dr Evette Doffing in the 9/29 office encounter. Sending to PA staff

## 2019-02-14 NOTE — Telephone Encounter (Signed)
  Follow up Call-  Call back number 02/10/2019 01/31/2019 01/14/2019 12/23/2018 12/06/2018 11/19/2018  Post procedure Call Back phone  # [phone]  Permission to leave phone message Yes Yes Yes Yes Yes Yes  Some recent data might be hidden     Patient questions:  Do you have a fever, pain , or abdominal swelling? No. Pain Score  0 *  Have you tolerated food without any problems? Yes.    Have you been able to return to your normal activities? Yes.    Do you have any questions about your discharge instructions: Diet   No. Medications  No. Follow up visit  No.  Do you have questions or concerns about your Care? No.  Actions: * If pain score is 4 or above: No action needed, pain <4.  1. Have you developed a fever since your procedure? no  2.   Have you had an respiratory symptoms (SOB or cough) since your procedure? no  3.   Have you tested positive for COVID 19 since your procedure no  4.   Have you had any family members/close contacts diagnosed with the COVID 19 since your procedure?  no   If yes to any of these questions please route to Joylene John, RN and Alphonsa Gin, Therapist, sports.

## 2019-02-15 MED FILL — SUBOXONE 8 MG-2 MG SL FILM: 8-2 | 30 days supply | Qty: 90 | Fill #0

## 2019-02-16 ENCOUNTER — Telehealth: Payer: Self-pay | Admitting: Gastroenterology

## 2019-02-16 ENCOUNTER — Other Ambulatory Visit: Payer: Self-pay

## 2019-02-16 DIAGNOSIS — Z1159 Encounter for screening for other viral diseases: Secondary | ICD-10-CM

## 2019-02-16 DIAGNOSIS — K222 Esophageal obstruction: Secondary | ICD-10-CM

## 2019-02-16 NOTE — Telephone Encounter (Signed)
Scheduled for EGD w/dilation on 02/23/19, patient to arrive at 3:30pm. Instructions reviewed verbally on the phone (no MyChart and patient has had this several times). Pre-Procedure Pt. Acknowledgement mailed with note for patient to bring in with him. Called Dr.Baron, Todd's office with [facility] and spoke to Dr. Ella Jubilee, Claiborne Billings [phone] pager (she works with him). States Dr. Lysle Rubens only does procedures and doesn't do Clinic visits anymore. Said she will send Dr. Mariea Clonts (one of their colleagues) an email as soon as we hang-up, asking him to call the patient and make an appt. for assessment. Let her know Dr. Havery [name] is having to do EGD with dilations about every 2 weeks for the patient and he is trying to get an Esophageal stenting scheduled, but probably can't until November. Wanting to know if they might be able to do it sooner.

## 2019-02-16 NOTE — Telephone Encounter (Signed)
Great thanks very much, I appreciate you speaking with them and coordinating the EGD.

## 2019-02-16 NOTE — Telephone Encounter (Signed)
I discussed this case with Dr. Rush Landmark in regards to possible esophageal stenting of refractory stricture. He is willing to do this but we need to order some equipment to do it and he may not have openings until November for this. We will reach out to Dr. Glenna Durand office at [facility] in the interim to see if they can get him in any sooner to have this done. In the interim, will plan on continued dilations every 2 weeks to keep his esophagus open enough to accommodate the stent. The next dilation I will also plan for another kenologue injection.   Sherlynn Stalls, can you please relay the following and help out with this: - can you please touch base with Dr. Charolette Forward office at [facility] and see if the patient can book a follow up with him ASAP. If he can't get in there in the next few weeks, Dr. Rush Landmark will see him in November for possible stent, however please get an appointment with Dr. Lysle Rubens regardless as his services may still be needed. Can you please let me know if you hear back from Dr. Glenna Durand office regarding appointment - can you let the patient know it has been recommended that we continue to dilate every 10-14 days or so to keep his esophagus open enough to accommodate the stent as he restrictures down quickly. I have an opening to do this on 10/14 if he can do it then.   Thank you!

## 2019-02-21 ENCOUNTER — Telehealth: Payer: Self-pay

## 2019-02-21 DIAGNOSIS — R1310 Dysphagia, unspecified: Secondary | ICD-10-CM | POA: Diagnosis not present

## 2019-02-21 DIAGNOSIS — Z1159 Encounter for screening for other viral diseases: Secondary | ICD-10-CM | POA: Diagnosis not present

## 2019-02-21 NOTE — Telephone Encounter (Signed)
-----   Message from Irving Copas., MD sent at 02/16/2019  4:47 AM EDT ----- Regarding: RE: possible esophageal stent Richardson Landry and Freight forwarder for [place], I think that a discussion and follow up with Dr. Zenia Resides is not unreasonable.  So I believe that he should be attempted to get a follow up with [facility]. Covering RN, please look for my next available slot, which I believe is in November and lets tentatively plan to try and get him on that as well. Once the dates are known for both we can decide if he is able to be seen by them sooner (since he is an established patient with them) then we can decide what will be the best method for trying to keep this patient's esophagus patent/open. Thank you. GM ----- Message ----- From: Yetta Flock, MD Sent: 02/11/2019   7:37 AM EDT To: Irving Copas., MD Subject: RE: possible esophageal stent                  Thanks Gabe. He does not have any follow up scheduled with Dr. Lysle Rubens yet... he is established there but hasn't followed through with coordinating. If you are interested in this case and can / want to do it, feel free. If it is too much to coordinate, etc., I understand, and can try to have our staff set up him up to go back to [facility]. He is hoping to avoid going back there if possible but understands the complexity of his case.   Richardson Landry ----- Message ----- From: Irving Copas., MD Sent: 02/11/2019   5:49 AM EDT To: Yetta Flock, MD Subject: RE: possible esophageal stent                  Steve,We can definitely consider an esophageal stent.  The thought of suturing in place for placing a stent fix clip to the region could be considered.I do not suture at this time.I also do not have the stent fix clip but that is something that we could order.When is he scheduled to see Dr. Okey Dupre?Gabe ----- Message ----- From: Yetta Flock, MD Sent: 02/10/2019   5:03 PM EDT To: Irving Copas., MD Subject:  possible esophageal stent                      Sloan Leiter, Want to get your thoughts on this case. Severe refractory peptic stricture. Had to have initial therapy with needle knife to open it up at [facility] by Gayleen Orem. He has strictured down repeatedly. I have done serial Savary dilations including kenologue injection but have not been able to get him above 1mm, and he strictures down severely even after only 10 days or so post dilation. Very tough case, wondering what you think about a stent to keep him open for a bit. He has not eaten in months, relying on PEG. Would you be willing to consider a stent for him, and if so, would it need to be sutured in place? If not, I can have him follow up again with Lysle Rubens at [facility], just curious to see if you have done this and think he's amenable. Stricture is about 3-4 cm in length.   Thanks, Richardson Landry

## 2019-02-22 ENCOUNTER — Telehealth: Payer: Self-pay

## 2019-02-22 LAB — SARS CORONAVIRUS 2 (TAT 6-24 HRS): SARS Coronavirus 2: NEGATIVE

## 2019-02-22 NOTE — Telephone Encounter (Signed)
Pt returned call and answered “No” to all questions.  ° °

## 2019-02-22 NOTE — Telephone Encounter (Signed)
Per Sherlynn Stalls she has taken care of this appt with Dr Okey Dupre.

## 2019-02-22 NOTE — Telephone Encounter (Signed)
Covid-19 screening questions   Do you now or have you had a fever in the last 14 days?  Do you have any respiratory symptoms of shortness of breath or cough now or in the last 14 days?  Do you have any family members or close contacts with diagnosed or suspected Covid-19 in the past 14 days?  Have you been tested for Covid-19 and found to be positive?       

## 2019-02-23 ENCOUNTER — Ambulatory Visit (AMBULATORY_SURGERY_CENTER): Payer: Medicaid Other | Admitting: Gastroenterology

## 2019-02-23 ENCOUNTER — Other Ambulatory Visit: Payer: Self-pay

## 2019-02-23 ENCOUNTER — Encounter: Payer: Self-pay | Admitting: Gastroenterology

## 2019-02-23 VITALS — BP 117/78 | HR 64 | Temp 98.3°F | Resp 13 | Ht 65.0 in | Wt 155.0 lb

## 2019-02-23 DIAGNOSIS — K222 Esophageal obstruction: Secondary | ICD-10-CM | POA: Diagnosis not present

## 2019-02-23 DIAGNOSIS — K3189 Other diseases of stomach and duodenum: Secondary | ICD-10-CM | POA: Diagnosis not present

## 2019-02-23 DIAGNOSIS — K2270 Barrett's esophagus without dysplasia: Secondary | ICD-10-CM

## 2019-02-23 MED ORDER — SODIUM CHLORIDE 0.9 % IV SOLN: 500.0000 mL | Freq: Once | INTRAVENOUS | Status: DC

## 2019-02-23 NOTE — Progress Notes (Signed)
KA- Temp CW- Vitals 

## 2019-02-23 NOTE — Op Note (Addendum)
[facility] Patient Name: Vincent Clarke Procedure Date: 02/23/2019 5:23 PM MRN: [id number] Endoscopist: Viviann Spare P. Adela Lank , MD Age: 53 Referring MD:  Date of Birth: 07-22-1965 Gender: Male Account #: [id number] Procedure:                Upper GI endoscopy Indications:              For therapy of refractory esophageal stricture -                            s/p needle knife at [facility] to open it initially, PEG                            in place for nutrition, multiple Savary dilations                            every 1-2 weeks with history of Kenologue                            injection, have not been able to progress further                            than 14mm. Last dilated 1 week ago. Awaiting follow                            up at [name] for possible stenting. Continuing to                            dilate periodically to keep lumen open in                            preparation for possible stent given quick                            recurrence of stenosis in between dilations Medicines:                Monitored Anesthesia Care Procedure:                Pre-Anesthesia Assessment:                           - Prior to the procedure, a History and Physical                            was performed, and patient medications and                            allergies were reviewed. The patient's tolerance of                            previous anesthesia was also reviewed. The risks                            and benefits of the procedure and the sedation  options and risks were discussed with the patient.                            All questions were answered, and informed consent                            was obtained. Prior Anticoagulants: The patient has                            taken no previous anticoagulant or antiplatelet                            agents. ASA Grade Assessment: II - A patient with                            mild systemic  disease. After reviewing the risks                            and benefits, the patient was deemed in                            satisfactory condition to undergo the procedure.                           After obtaining informed consent, the endoscope was                            passed under direct vision. Throughout the                            procedure, the patient's blood pressure, pulse, and                            oxygen saturations were monitored continuously. The                            Endoscope was introduced through the mouth, and                            advanced to the body of the stomach. The upper GI                            endoscopy was accomplished without difficulty. The                            patient tolerated the procedure well. Scope In: Scope Out: Findings:                 Esophagogastric landmarks were identified: the                            gastroesophageal junction was found at 38 cm and  the upper extent of the gastric folds was found at                            40 cm from the incisors.                           A 2 cm hiatal hernia was present.                           Barrett's esophagus was present in the lower third                            of the esophagus. The maximum longitudinal extent                            of these mucosal changes was 3 cm in length. Not                            biopsied given prior biopsies obtained and did not                            show dysplasia.                           One benign-appearing, intrinsic moderate stenosis                            was found 30 cm from the incisors. This stenosis                            measured 3-4 cm (in length). The stenosis was able                            to be traversed. Area was successfully injected                            with of triamcinolone (40 mg/mL total) for drug                            delivery -  in each  quadrant, 4 quadrant                            injection. A guidewire was placed and the scope was                            withdrawn. Dilation was performed with a Savary                            dilator with mild resistance at 12 mm, 14 mm and                            then 15 mm. Relook endoscopy showed multiple  appropriate mucosal wrents (6)                           There was evidence of a gastrostomy present in the                            gastric body. Rest of stomach and duodenum not                            examined given multiple prior exams. Complications:            No immediate complications. Estimated blood loss:                            Minimal. Estimated Blood Loss:     Estimated blood loss was minimal. Impression:               - Esophagogastric landmarks identified.                           - 2 cm hiatal hernia.                           - Barrett's esophagus.                           - Benign-appearing esophageal stenosis as above.                            Injected with Kenologue (2nd time total). Dilated                            to 15mm with good result.                           - Gastrostomy present. Recommendation:           - Patient has a contact number available for                            emergencies. The signs and symptoms of potential                            delayed complications were discussed with the                            patient. Return to normal activities tomorrow.                            Written discharge instructions were provided to the                            patient.                           - Full liquid diet. NO MEAT                           -  Continue present medications.                           - Repeat upper endoscopy in 10 days or so for                            retreatment while we await follow up treatment at                            [name]. I will contact primary GI at [name]  for update on                            his condition. Viviann Spare P. Erva Koke, MD 02/23/2019 6:11:10 PM This report has been signed electronically.

## 2019-02-23 NOTE — Progress Notes (Signed)
Patient with lower epigastric pain, described as sharp and 8/10 on scale.   Dr Havery [name] returned to assess.  Pain slowly dissipated after patient drank small amount of water.    Patient discharged with strict instructions to call Dr Doyne Keel cell phone or 911 if pain returns without relief.  Patient and wife verbalized understanding.

## 2019-02-23 NOTE — Progress Notes (Signed)
To PACU, VSS. Report to Rn.tb 

## 2019-02-23 NOTE — Progress Notes (Signed)
Called to room to assist during endoscopic procedure.  Patient ID and intended procedure confirmed with present staff. Received instructions for my participation in the procedure from the performing physician.  

## 2019-02-23 NOTE — Patient Instructions (Addendum)
Thank you for allowing [place] to care for you today!  Continue current medications.  Full liquid diet... NO MEAT.  Repeat upper endoscopy in 10 days or so for re treatment while we await follow up treatment at [facility].   Dr Havery [name] will contact primary GI at [facility] for update on his condition.     YOU HAD AN ENDOSCOPIC PROCEDURE TODAY AT [facility]:   Refer to the procedure report that was given to you for any specific questions about what was found during the examination.  If the procedure report does not answer your questions, please call your gastroenterologist to clarify.  If you requested that your care partner not be given the details of your procedure findings, then the procedure report has been included in a sealed envelope for you to review at your convenience later.  YOU SHOULD EXPECT: Some feelings of bloating in the abdomen. Passage of more gas than usual.  Walking can help get rid of the air that was put into your GI tract during the procedure and reduce the bloating. If you had a lower endoscopy (such as a colonoscopy or flexible sigmoidoscopy) you may notice spotting of blood in your stool or on the toilet paper. If you underwent a bowel prep for your procedure, you may not have a normal bowel movement for a few days.  Please Note:  You might notice some irritation and congestion in your nose or some drainage.  This is from the oxygen used during your procedure.  There is no need for concern and it should clear up in a day or so.  SYMPTOMS TO REPORT IMMEDIATELY:   Following upper endoscopy (EGD)  Vomiting of blood or coffee ground material  New chest pain or pain under the shoulder blades  Painful or persistently difficult swallowing  New shortness of breath  Fever of 100F or higher  Black, tarry-looking stools  For urgent or emergent issues, a gastroenterologist can be reached at any hour by calling [phone].   DIET:  We do recommend a small meal at  first, but then you may proceed to your regular diet.  Drink plenty of fluids but you should avoid alcoholic beverages for 24 hours.  ACTIVITY:  You should plan to take it easy for the rest of today and you should NOT DRIVE or use heavy machinery until tomorrow (because of the sedation medicines used during the test).    FOLLOW UP: Our staff will call the number listed on your records 48-72 hours following your procedure to check on you and address any questions or concerns that you may have regarding the information given to you following your procedure. If we do not reach you, we will leave a message.  We will attempt to reach you two times.  During this call, we will ask if you have developed any symptoms of COVID 19. If you develop any symptoms (ie: fever, flu-like symptoms, shortness of breath, cough etc.) before then, please call [phone].  If you test positive for Covid 19 in the 2 weeks post procedure, please call and report this information to [place].    If any biopsies were taken you will be contacted by phone or by letter within the next 1-3 weeks.  Please call us at [phone] if you have not heard about the biopsies in 3 weeks.    SIGNATURES/CONFIDENTIALITY: You and/or your care partner have signed paperwork which will be entered into your electronic medical record.  These signatures  attest to the fact that that the information above on your After Visit Summary has been reviewed and is understood.  Full responsibility of the confidentiality of this discharge information lies with you and/or your care-partner.

## 2019-02-24 ENCOUNTER — Other Ambulatory Visit: Payer: Self-pay | Admitting: Internal Medicine

## 2019-02-24 NOTE — Telephone Encounter (Signed)
Pls contact pharmacy [phone]  

## 2019-02-24 NOTE — Telephone Encounter (Signed)
Called pharm back, they have resolved the problem

## 2019-02-25 ENCOUNTER — Telehealth: Payer: Self-pay

## 2019-02-25 NOTE — Telephone Encounter (Signed)
  Follow up Call-  Call back number 02/23/2019 02/10/2019 01/31/2019 01/14/2019 12/23/2018 12/06/2018 11/19/2018  Post procedure Call Back phone  # [phone]  Permission to leave phone message Yes Yes Yes Yes Yes Yes Yes  Some recent data might be hidden     Patient questions:  Do you have a fever, pain , or abdominal swelling? No. Pain Score  0 *  Have you tolerated food without any problems? Yes.    Have you been able to return to your normal activities? Yes.    Do you have any questions about your discharge instructions: Diet   No. Medications  No. Follow up visit  No.  Do you have questions or concerns about your Care? No.  Actions: * If pain score is 4 or above: No action needed, pain <4.  1. Have you developed a fever since your procedure? no  2.   Have you had an respiratory symptoms (SOB or cough) since your procedure? no  3.   Have you tested positive for COVID 19 since your procedure no  4.   Have you had any family members/close contacts diagnosed with the COVID 19 since your procedure?  no   If yes to any of these questions please route to Joylene John, RN and Alphonsa Gin, Therapist, sports.

## 2019-03-03 ENCOUNTER — Emergency Department (HOSPITAL_COMMUNITY): Payer: Medicaid Other

## 2019-03-03 ENCOUNTER — Emergency Department (HOSPITAL_COMMUNITY)
Admission: EM | Admit: 2019-03-03 | Discharge: 2019-03-04 | Disposition: A | Discharge status: Home / Self Care | Payer: Medicaid Other | Attending: Emergency Medicine | Admitting: Emergency Medicine

## 2019-03-03 ENCOUNTER — Telehealth: Payer: Self-pay | Admitting: Gastroenterology

## 2019-03-03 ENCOUNTER — Other Ambulatory Visit: Payer: Self-pay

## 2019-03-03 ENCOUNTER — Encounter (HOSPITAL_COMMUNITY): Payer: Self-pay

## 2019-03-03 DIAGNOSIS — E876 Hypokalemia: Secondary | ICD-10-CM

## 2019-03-03 DIAGNOSIS — T50904A Poisoning by unspecified drugs, medicaments and biological substances, undetermined, initial encounter: Secondary | ICD-10-CM

## 2019-03-03 DIAGNOSIS — Z79899 Other long term (current) drug therapy: Secondary | ICD-10-CM | POA: Diagnosis not present

## 2019-03-03 DIAGNOSIS — K311 Adult hypertrophic pyloric stenosis: Secondary | ICD-10-CM | POA: Diagnosis not present

## 2019-03-03 DIAGNOSIS — I10 Essential (primary) hypertension: Secondary | ICD-10-CM | POA: Insufficient documentation

## 2019-03-03 DIAGNOSIS — T401X4A Poisoning by heroin, undetermined, initial encounter: Secondary | ICD-10-CM | POA: Insufficient documentation

## 2019-03-03 DIAGNOSIS — Z978 Presence of other specified devices: Secondary | ICD-10-CM | POA: Diagnosis not present

## 2019-03-03 DIAGNOSIS — Z20828 Contact with and (suspected) exposure to other viral communicable diseases: Secondary | ICD-10-CM | POA: Insufficient documentation

## 2019-03-03 DIAGNOSIS — F17210 Nicotine dependence, cigarettes, uncomplicated: Secondary | ICD-10-CM | POA: Insufficient documentation

## 2019-03-03 DIAGNOSIS — J45909 Unspecified asthma, uncomplicated: Secondary | ICD-10-CM | POA: Diagnosis not present

## 2019-03-03 DIAGNOSIS — T401X1A Poisoning by heroin, accidental (unintentional), initial encounter: Secondary | ICD-10-CM | POA: Diagnosis not present

## 2019-03-03 DIAGNOSIS — R109 Unspecified abdominal pain: Secondary | ICD-10-CM | POA: Diagnosis not present

## 2019-03-03 DIAGNOSIS — Z03818 Encounter for observation for suspected exposure to other biological agents ruled out: Secondary | ICD-10-CM | POA: Diagnosis not present

## 2019-03-03 LAB — RAPID URINE DRUG SCREEN, HOSP PERFORMED
Amphetamines: NOT DETECTED
Barbiturates: NOT DETECTED
Benzodiazepines: NOT DETECTED
Cocaine: NOT DETECTED
Opiates: POSITIVE — AB
Tetrahydrocannabinol: NOT DETECTED

## 2019-03-03 LAB — COMPREHENSIVE METABOLIC PANEL
ALT: 14 U/L (ref 0–44)
AST: 16 U/L (ref 15–41)
Albumin: 3.8 g/dL (ref 3.5–5.0)
Alkaline Phosphatase: 96 U/L (ref 38–126)
Anion gap: 12 (ref 5–15)
BUN: <5 mg/dL — ABNORMAL LOW (ref 6–20)
CO2: 22 mmol/L (ref 22–32)
Calcium: 9.3 mg/dL (ref 8.9–10.3)
Chloride: 108 mmol/L (ref 98–111)
Creatinine, Ser: 0.69 mg/dL (ref 0.61–1.24)
GFR calc Af Amer: >60 mL/min (ref >60)
GFR calc non Af Amer: >60 mL/min (ref >60)
Glucose, Bld: 116 mg/dL — ABNORMAL HIGH (ref 70–99)
Potassium: 3 mmol/L — ABNORMAL LOW (ref 3.5–5.1)
Sodium: 142 mmol/L (ref 135–145)
Total Bilirubin: 0.9 mg/dL (ref 0.3–1.2)
Total Protein: 6.9 g/dL (ref 6.5–8.1)

## 2019-03-03 LAB — CBC WITH DIFFERENTIAL/PLATELET
Abs Immature Granulocytes: 0.03 10*3/uL (ref 0.00–0.07)
Basophils Absolute: 0.1 10*3/uL (ref 0.0–0.1)
Basophils Relative: 1 %
Eosinophils Absolute: 0.1 10*3/uL (ref 0.0–0.5)
Eosinophils Relative: 1 %
HCT: 43.8 % (ref 39.0–52.0)
Hemoglobin: 15.1 g/dL (ref 13.0–17.0)
Immature Granulocytes: 0 %
Lymphocytes Relative: 20 %
Lymphs Abs: 2 10*3/uL (ref 0.7–4.0)
MCH: 30.3 pg (ref 26.0–34.0)
MCHC: 34.5 g/dL (ref 30.0–36.0)
MCV: 88 fL (ref 80.0–100.0)
Monocytes Absolute: 0.7 10*3/uL (ref 0.1–1.0)
Monocytes Relative: 7 %
Neutro Abs: 7.1 10*3/uL (ref 1.7–7.7)
Neutrophils Relative %: 71 %
Platelets: 359 10*3/uL (ref 150–400)
RBC: 4.98 MIL/uL (ref 4.22–5.81)
RDW: 13.2 % (ref 11.5–15.5)
WBC: 10.1 10*3/uL (ref 4.0–10.5)
nRBC: 0 % (ref 0.0–0.2)

## 2019-03-03 LAB — TYPE AND SCREEN
ABO/RH(D): A POS
Antibody Screen: NEGATIVE

## 2019-03-03 LAB — CBG MONITORING, ED: Glucose-Capillary: 104 mg/dL — ABNORMAL HIGH (ref 70–99)

## 2019-03-03 LAB — ETHANOL: Alcohol, Ethyl (B): <10 mg/dL (ref <10)

## 2019-03-03 LAB — ACETAMINOPHEN LEVEL: Acetaminophen (Tylenol), Serum: <10 ug/mL — ABNORMAL LOW (ref 10–30)

## 2019-03-03 LAB — SALICYLATE LEVEL: Salicylate Lvl: <7 mg/dL (ref 2.8–30.0)

## 2019-03-03 LAB — SARS CORONAVIRUS 2 BY RT PCR (HOSPITAL ORDER, PERFORMED IN ~~LOC~~ HOSPITAL LAB): SARS Coronavirus 2: NEGATIVE

## 2019-03-03 LAB — POC OCCULT BLOOD, ED: Fecal Occult Bld: NEGATIVE

## 2019-03-03 LAB — ABO/RH: ABO/RH(D): A POS

## 2019-03-03 MED ORDER — ONDANSETRON HCL 4 MG/2ML IJ SOLN
4.0000 mg | Freq: Once | INTRAMUSCULAR | Status: AC
  Administered 2019-03-03: 4 mg via INTRAVENOUS
  Filled 2019-03-03: qty 2

## 2019-03-03 MED ORDER — ONDANSETRON HCL 4 MG PO TABS: 4.0000 mg | ORAL_TABLET | Freq: Three times a day (TID) | ORAL | 0 refills | Status: AC | PRN

## 2019-03-03 MED ORDER — POTASSIUM CHLORIDE 10 MEQ/100ML IV SOLN
10.0000 meq | INTRAVENOUS | Status: AC
  Administered 2019-03-03 (×2): 10 meq via INTRAVENOUS
  Filled 2019-03-03 (×2): qty 100

## 2019-03-03 MED ORDER — ACETAMINOPHEN 325 MG PO TABS
650.0000 mg | ORAL_TABLET | Freq: Once | ORAL | Status: AC
  Administered 2019-03-03: 650 mg via ORAL
  Filled 2019-03-03: qty 2

## 2019-03-03 MED ORDER — SODIUM CHLORIDE 0.9 % IV BOLUS
1000.0000 mL | Freq: Once | INTRAVENOUS | Status: AC
  Administered 2019-03-03: 1000 mL via INTRAVENOUS

## 2019-03-03 MED ORDER — IOHEXOL 300 MG/ML  SOLN
100.0000 mL | Freq: Once | INTRAMUSCULAR | Status: AC | PRN
  Administered 2019-03-03: 20:00:00 100 mL via INTRAVENOUS

## 2019-03-03 MED ORDER — ONDANSETRON 4 MG PO TBDP
4.0000 mg | ORAL_TABLET | Freq: Once | ORAL | Status: AC
  Administered 2019-03-03: 4 mg via ORAL
  Filled 2019-03-03: qty 1

## 2019-03-03 NOTE — Telephone Encounter (Signed)
Called by ED  Pt in ED for heroin/cocaine OD Treated symptomatically -being discharged home Had 1 episode of N/V before being brought to ED. CT AP done showed ?  Gastric outlet obstruction due to PEG balloon.  I was told that GI had placed the PEG.  I told the ED doc to gently pull the inflated PEG tube to the skin and snug abdominal plastic anchor.  That should solve the problem.  Stomach was not distended on CT.    When I finally got into [facility], I realized IR had placed PEG tube and then replaced it with 18 Fr balloon PEG on 12/09/2018.   [name], Can you please call patient's wife tomorrow.  If he still has N/V, please have him see IR (early next week would be fine too).   Richardson Landry, do you think it is reasonable?    RG
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
## 2019-03-03 NOTE — Discharge Instructions (Addendum)
Please make an appointment with Dr. Lyndel Safe of gastroenterology follow-up for your PEG tube management.  Your labs are otherwise within normal limits.  Please discontinue drug use as this is worsening your condition.

## 2019-03-03 NOTE — ED Triage Notes (Signed)
Pt arrived via [facility] from home. Pt snorted heroin and became apneic. Pt's significant other gave pt narcan 4MG  IM. Pt now c/o abdominal pain, nausea, tremors. Pt told EMS that he hasn't used heroin in the last couple of days and that he has been withdrawaling. Pt's breathing is within normal limits. Pt drowsy, but arousable with repeated verbal stimuli and physical stimuli. Pt is confused.

## 2019-03-03 NOTE — ED Notes (Signed)
Pt asked for some pain meds for abdominal pain.

## 2019-03-04 NOTE — Telephone Encounter (Signed)
Sherlynn Stalls this is a Dr Havery [name] pt.  Dr Rush Landmark has not seen this pt as of yet.  I tried to call the pt but no answer and no voice mail

## 2019-03-04 NOTE — Telephone Encounter (Signed)
Thanks Merrie Roof, I agree with you. Hopefully just pulling it to the skin / making it snug will solve the issue. Otherwise, yes this PEG was placed by IR and if further issues with it, he will need to follow up with them. Thanks for the follow up.

## 2019-03-07 DIAGNOSIS — R1310 Dysphagia, unspecified: Secondary | ICD-10-CM | POA: Diagnosis not present

## 2019-03-07 DIAGNOSIS — K222 Esophageal obstruction: Secondary | ICD-10-CM | POA: Diagnosis not present

## 2019-03-07 NOTE — Telephone Encounter (Signed)
Called patient and he states he is feeling better and no more n/v. I asked him if he starts feeling bad again to let us know and he would need to see IR for assessment/repositioning of his Peg tube. He agreed

## 2019-03-08 ENCOUNTER — Ambulatory Visit (INDEPENDENT_AMBULATORY_CARE_PROVIDER_SITE_OTHER): Payer: Medicaid Other | Admitting: Student in an Organized Health Care Education/Training Program

## 2019-03-08 ENCOUNTER — Other Ambulatory Visit: Payer: Self-pay

## 2019-03-08 ENCOUNTER — Telehealth: Payer: Self-pay | Admitting: *Deleted

## 2019-03-08 ENCOUNTER — Telehealth: Payer: Self-pay

## 2019-03-08 DIAGNOSIS — F1121 Opioid dependence, in remission: Secondary | ICD-10-CM

## 2019-03-08 DIAGNOSIS — F1120 Opioid dependence, uncomplicated: Secondary | ICD-10-CM | POA: Diagnosis not present

## 2019-03-08 DIAGNOSIS — R1310 Dysphagia, unspecified: Secondary | ICD-10-CM

## 2019-03-08 MED ORDER — BUPRENORPHINE HCL-NALOXONE HCL 8-2 MG SL FILM: 1.0000 | ORAL_FILM | Freq: Two times a day (BID) | SUBLINGUAL | 0 refills | Status: DC

## 2019-03-08 NOTE — Telephone Encounter (Signed)
Covid-19 screening questions   Do you now or have you had a fever in the last 14 days? NO   Do you have any respiratory symptoms of shortness of breath or cough now or in the last 14 days? NO  Do you have any family members or close contacts with diagnosed or suspected Covid-19 in the past 14 days? NO  Have you been tested for Covid-19 and found to be positive? NO        

## 2019-03-08 NOTE — Assessment & Plan Note (Signed)
Severe opioid use disorder, not well controlled.  Still having trouble with consistency in taking Suboxone.  Still having trouble with frequent relapses.  At this point we come to the conclusion that he will require a higher level of care.  He is actively looking into his options for residential or inpatient rehab.  He has a procedure scheduled for November 10 at [facility] to place an esophageal stent to help with his severe dysphagia.  Hopeful that will improve his symptoms, as I anticipate he probably being too medically complex right now for most residential rehab.  For now we are going to continue with Suboxone 8 mg twice daily.  I counseled him on the importance of consistency, we set a goal to be more consistent with Suboxone.  At this point I think this is a risk reduction model of treatments.  Once he is more medically stable we will plan to transition him to a residential rehab program in November.  I encouraged him to look for a treatment center that allows for medication-assisted treatment.

## 2019-03-08 NOTE — Telephone Encounter (Signed)
Call from [place] [facility], needing to discuss suboxone script, informed dr Evette Doffing he will call the pharm

## 2019-03-08 NOTE — Progress Notes (Signed)
   Assessment and Plan:  See Encounters tab for problem-based medical decision making.   __________________________________________________________  HPI:   53 year old man here for follow-up of opioid use disorder.  Struggling over the last few months with frequent relapses.  One hospitalization over a month ago for amphetamine overdose.  Continues to struggle with esophageal disease that is partly due to his opioid use disorder.  Still using heroin several times a week.  Inconsistent with using Suboxone.  No symptoms of withdrawal, no recent overdoses.  Denies fevers or chills.  No chest pain or shortness of breath.  Mental health is not going on either, struggling with depression, isolation, he feels worthless, struggling with the idea of not being employed.  Tells me that he is already planning on going to an inpatient rehab center as he recognizes that he is not doing well with current treatment.  __________________________________________________________  Problem List: Patient Active Problem List   Diagnosis Date Noted  . Opioid use disorder, moderate, in early remission [facility])     Priority: High  . 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 Toxic encephalopathy 01/22/2019  . Fever, unspecified 10/14/2018  . S/P percutaneous endoscopic gastrostomy (PEG) tube placement ([place]) 07/06/2018  . Esophageal obstruction   . Esophageal stricture   . Dysphagia 06/22/2018  . Esophagitis, erosive 06/05/2018  . HTN (hypertension) 03/21/2014  . Asthma, chronic 03/21/2014  . GERD (gastroesophageal reflux disease) 03/21/2014  . Tobacco use disorder 03/21/2014    Medications: Reconciled today in Epic __________________________________________________________  Physical Exam:  Vital Signs: Vitals:   03/08/19 0950  BP: (!) 163/91  Pulse: 80  SpO2: 100%  Weight: 155 lb 6.4 oz (70.5 kg)    Gen: Well appearing, NAD ENT: OP clear without erythema or exudate.  Neck: No cervical  LAD, No thyromegaly or nodules, No JVD. CV: RRR, no murmurs Pulm: Normal effort, CTA throughout, no wheezing Abd: Soft, NT, ND, normal BS.  Ext: Warm, no edema, normal joints Skin: No atypical appearing moles. No rashes

## 2019-03-09 ENCOUNTER — Other Ambulatory Visit: Payer: Self-pay | Admitting: *Deleted

## 2019-03-09 ENCOUNTER — Telehealth: Payer: Self-pay | Admitting: Gastroenterology

## 2019-03-09 ENCOUNTER — Encounter: Payer: Medicaid Other | Admitting: Gastroenterology

## 2019-03-09 NOTE — Telephone Encounter (Signed)
No charge, that's okay, thanks

## 2019-03-09 NOTE — Telephone Encounter (Signed)
Hey Dr. Havery [name]- this patient Vincent Clarke called to cancel his endoscopy for today at 4:30- He stated that his ride/care partner never showed up to get him to take him to the appointment. He did reschedule for Monday 11/2 at 4:00. Did you want to charge this patient?

## 2019-03-10 ENCOUNTER — Other Ambulatory Visit: Payer: Self-pay | Admitting: Gastroenterology

## 2019-03-10 ENCOUNTER — Telehealth: Payer: Self-pay

## 2019-03-10 DIAGNOSIS — Z1159 Encounter for screening for other viral diseases: Secondary | ICD-10-CM | POA: Diagnosis not present

## 2019-03-10 NOTE — Telephone Encounter (Signed)
Called patient and he is going to try to get over the [facility] today to get COVID tested for his EGD on 03/14/19. If he can't, he will call us back

## 2019-03-10 NOTE — Telephone Encounter (Signed)
-----   Message from Yetta Flock, MD sent at 03/10/2019  7:30 AM EDT ----- Lorelle Formosa,  This patient is scheduled for an EGD on Monday. Can you see if he is willing to get a COVID test this AM? If he is, he may be able to still get his exam done. If not, then he will have to be rescheduled. Thanks

## 2019-03-10 NOTE — Telephone Encounter (Signed)
Okay great thanks

## 2019-03-11 LAB — SARS CORONAVIRUS 2 (TAT 6-24 HRS): SARS Coronavirus 2: NEGATIVE

## 2019-03-11 MED FILL — PANTOPRAZOLE SOD DR 40 MG T: 40 | 15 days supply | Qty: 60 | Fill #3

## 2019-03-13 DIAGNOSIS — K222 Esophageal obstruction: Secondary | ICD-10-CM | POA: Diagnosis not present

## 2019-03-13 DIAGNOSIS — R1310 Dysphagia, unspecified: Secondary | ICD-10-CM | POA: Diagnosis not present

## 2019-03-14 ENCOUNTER — Ambulatory Visit (AMBULATORY_SURGERY_CENTER): Payer: Medicaid Other | Admitting: Gastroenterology

## 2019-03-14 ENCOUNTER — Other Ambulatory Visit: Payer: Self-pay

## 2019-03-14 ENCOUNTER — Encounter: Payer: Self-pay | Admitting: Gastroenterology

## 2019-03-14 VITALS — BP 97/57 | HR 62 | Temp 98.4°F | Resp 16 | Ht 65.0 in | Wt 155.0 lb

## 2019-03-14 DIAGNOSIS — K449 Diaphragmatic hernia without obstruction or gangrene: Secondary | ICD-10-CM

## 2019-03-14 DIAGNOSIS — K222 Esophageal obstruction: Secondary | ICD-10-CM | POA: Diagnosis not present

## 2019-03-14 DIAGNOSIS — R1310 Dysphagia, unspecified: Secondary | ICD-10-CM

## 2019-03-14 MED ORDER — SODIUM CHLORIDE 0.9 % IV SOLN: 500.0000 mL | Freq: Once | INTRAVENOUS | Status: DC

## 2019-03-14 NOTE — Op Note (Signed)
[place] Patient Name: Vincent Clarke Procedure Date: 03/14/2019 3:50 PM MRN: [id number] Endoscopist: Remo Lipps P. Havery [name] , MD Age: 53 Referring MD:  Date of Birth: Feb 11, 1966 Gender: Male Account #: [id number] Procedure:                Upper GI endoscopy Indications:              For therapy of esophageal stricture - refractory                            stricture, s/p kneedle knife at [facility] previously with                            recurrent stricturing since that time. Multiple                            EGDs, kenologue injections - last done 2 weeks ago,                            largest diameter Savary dilation to 63mm but even                            after 1 week there is recurrent stricturing despite                            high dose protonix. Pending follow up at [facility] for                            possible stenting. Medicines:                Monitored Anesthesia Care Procedure:                Pre-Anesthesia Assessment:                           - Prior to the procedure, a History and Physical                            was performed, and patient medications and                            allergies were reviewed. The patient's tolerance of                            previous anesthesia was also reviewed. The risks                            and benefits of the procedure and the sedation                            options and risks were discussed with the patient.                            All questions were answered, and informed consent  was obtained. Prior Anticoagulants: The patient has                            taken no previous anticoagulant or antiplatelet                            agents. ASA Grade Assessment: II - A patient with                            mild systemic disease. After reviewing the risks                            and benefits, the patient was deemed in                            satisfactory condition to  undergo the procedure.                           After obtaining informed consent, the endoscope was                            passed under direct vision. Throughout the                            procedure, the patient's blood pressure, pulse, and                            oxygen saturations were monitored continuously. The                            Endoscope was introduced through the mouth, and                            advanced to the antrum of the stomach. The upper GI                            endoscopy was accomplished without difficulty. The                            patient tolerated the procedure well. Scope In: Scope Out: Findings:                 A 2 cm hiatal hernia was present.                           Barrett's esophagus was present in the lower third                            of the esophagus. The maximum longitudinal extent                            of these mucosal changes was 3 cm in length.  Biopsies not taken given it has done in the past                            without dysplasia.                           One benign-appearing, intrinsic moderate stenosis                            was found 30 cm from the incisors. This stenosis                            measured 3-4 cm (in length). Most recently dilated                            to 15mm roughly 2 weeks ago, it has since                            restenosed to around 57mm or so. The stenosis was                            traversed with the endoscope. A guidewire was                            placed and the scope was withdrawn. Dilation was                            performed with a Savary dilator with moderate                            resistance at 13 mm and 15 mm after which multiple                            appropriate mucosal wrents were noted on relook                            endoscopy.                           The exam of the esophagus was otherwise normal.                            There was evidence of a gastrostomy present in the                            gastric body. This was characterized by healthy                            appearing mucosa.                           The exam of the stomach was otherwise normal.  Duodenum not examined given multiple prior normal                            exams. Complications:            No immediate complications. Estimated blood loss:                            Minimal. Estimated Blood Loss:     Estimated blood loss was minimal. Impression:               - 2 cm hiatal hernia.                           - Barrett's esophagus - stable in appearance.                           - Benign-appearing esophageal stenosis. Restenosed                            since last exam. Dilated to 15mm with good result.                           - Gastrostomy present characterized by healthy                            appearing mucosa. Recommendation:           - Patient has a contact number available for                            emergencies. The signs and symptoms of potential                            delayed complications were discussed with the                            patient. Return to normal activities tomorrow.                            Written discharge instructions were provided to the                            patient.                           - Full liquid diet.                           - Continue present medications.                           - Repeat upper endoscopy for retreatment at [facility]                            next week. Will discuss case with them in regards  to possible stenting. Viviann SpareSteven P. Sister Carbone, MD 03/14/2019 4:34:04 PM This report has been signed electronically.

## 2019-03-14 NOTE — Progress Notes (Signed)
Called to room to assist during endoscopic procedure.  Patient ID and intended procedure confirmed with present staff. Received instructions for my participation in the procedure from the performing physician.  

## 2019-03-14 NOTE — Patient Instructions (Signed)
FULL LIQUID DIET.  Repeat upper endoscopy for retreatment at [facility] next week.  YOU HAD AN ENDOSCOPIC PROCEDURE TODAY AT [facility]:   Refer to the procedure report that was given to you for any specific questions about what was found during the examination.  If the procedure report does not answer your questions, please call your gastroenterologist to clarify.  If you requested that your care partner not be given the details of your procedure findings, then the procedure report has been included in a sealed envelope for you to review at your convenience later.  YOU SHOULD EXPECT: Some feelings of bloating in the abdomen. Passage of more gas than usual.  Walking can help get rid of the air that was put into your GI tract during the procedure and reduce the bloating. If you had a lower endoscopy (such as a colonoscopy or flexible sigmoidoscopy) you may notice spotting of blood in your stool or on the toilet paper. If you underwent a bowel prep for your procedure, you may not have a normal bowel movement for a few days.  Please Note:  You might notice some irritation and congestion in your nose or some drainage.  This is from the oxygen used during your procedure.  There is no need for concern and it should clear up in a day or so.  SYMPTOMS TO REPORT IMMEDIATELY:    Following upper endoscopy (EGD)  Vomiting of blood or coffee ground material  New chest pain or pain under the shoulder blades  Painful or persistently difficult swallowing  New shortness of breath  Fever of 100F or higher  Black, tarry-looking stools  For urgent or emergent issues, a gastroenterologist can be reached at any hour by calling [phone].   DIET:  We do recommend a small meal at first, but then you may proceed to your regular diet.  Drink plenty of fluids but you should avoid alcoholic beverages for 24 hours.  ACTIVITY:  You should plan to take it easy for the rest of today and you should NOT  DRIVE or use heavy machinery until tomorrow (because of the sedation medicines used during the test).    FOLLOW UP: Our staff will call the number listed on your records 48-72 hours following your procedure to check on you and address any questions or concerns that you may have regarding the information given to you following your procedure. If we do not reach you, we will leave a message.  We will attempt to reach you two times.  During this call, we will ask if you have developed any symptoms of COVID 19. If you develop any symptoms (ie: fever, flu-like symptoms, shortness of breath, cough etc.) before then, please call [phone].  If you test positive for Covid 19 in the 2 weeks post procedure, please call and report this information to [place].    If any biopsies were taken you will be contacted by phone or by letter within the next 1-3 weeks.  Please call us at [phone] if you have not heard about the biopsies in 3 weeks.    SIGNATURES/CONFIDENTIALITY: You and/or your care partner have signed paperwork which will be entered into your electronic medical record.  These signatures attest to the fact that that the information above on your After Visit Summary has been reviewed and is understood.  Full responsibility of the confidentiality of this discharge information lies with you and/or your care-partner.

## 2019-03-14 NOTE — Progress Notes (Signed)
A/ox3, pleased with MAC, report to RN 

## 2019-03-14 NOTE — Progress Notes (Signed)
VS Vincent Clarke Temperature- Lisa Clapps 

## 2019-03-15 NOTE — Telephone Encounter (Signed)
Refill Request-  Pt states he has been waiting since 03/08/2019 to get he medication.  Pt went to pick up medication today and was told he would have to wait until 03/21/2019 to pick it up.  Pt would like a call back as to why.  Buprenorphine HCl-Naloxone HCl 8-2 MG FILM  [facility] - [place], [place] - [address].

## 2019-03-16 ENCOUNTER — Telehealth: Payer: Self-pay

## 2019-03-16 ENCOUNTER — Other Ambulatory Visit: Payer: Self-pay

## 2019-03-16 MED FILL — SUBOXONE 8 MG-2 MG SL FILM: 8-2 | 21 days supply | Qty: 42 | Fill #0

## 2019-03-16 NOTE — Telephone Encounter (Signed)
I spoke with Bonnita Nasuti. It sounds like he had one extra suboxone script on record at pharmacy that I was unaware of. He has been inconsistent in filling and adherence, which is causing this confusion. We are using the suboxone for risk reduction to bridge him for when he can arrange a residential rehab treatment. Ok with me for him to fill the scrip I wrote on 10/27 whenever he runs out of his current script.

## 2019-03-16 NOTE — Telephone Encounter (Signed)
Patient is calling today and asking about getting his suboxone, please advise if he must wait until 11/9 or today

## 2019-03-16 NOTE — Telephone Encounter (Signed)
Ok. Then ok to fill the next script that is on file from 10/27.

## 2019-03-16 NOTE — Telephone Encounter (Signed)
Left message on answering machine. 

## 2019-03-16 NOTE — Telephone Encounter (Signed)
Per pharmacy last picked up suboxone  Written 10/6, picked up 10/6 #90, spoke w/ pharmacist they will fill 10/27 script today and call pt

## 2019-03-16 NOTE — Telephone Encounter (Signed)
  Follow up Call-  Call back number 03/14/2019 02/23/2019 02/10/2019 01/31/2019 01/14/2019 12/23/2018 12/06/2018  Post procedure Call Back phone  # [phone]  Permission to leave phone message Yes Yes Yes Yes Yes Yes Yes  Some recent data might be hidden     Patient questions:  Do you have a fever, pain , or abdominal swelling? No. Pain Score  0 *  Have you tolerated food without any problems? Yes.    Have you been able to return to your normal activities? Yes.    Do you have any questions about your discharge instructions: Diet   No. Medications  No. Follow up visit  No.  Do you have questions or concerns about your Care? No.  Actions: * If pain score is 4 or above: 1. No action needed, pain <4.Have you developed a fever since your procedure? no  2.   Have you had an respiratory symptoms (SOB or cough) since your procedure? no  3.   Have you tested positive for COVID 19 since your procedure no  4.   Have you had any family members/close contacts diagnosed with the COVID 19 since your procedure?  no   If yes to any of these questions please route to Joylene John, RN and Alphonsa Gin, Therapist, sports.

## 2019-03-16 NOTE — Telephone Encounter (Signed)
Buprenorphine HCl-Naloxone HCl 8-2 MG FILM   Refill request @  [place], [place] - [address] [phone] (Phone) [phone] (Fax)   Pt would like the nurse to call back.

## 2019-03-18 ENCOUNTER — Telehealth: Payer: Self-pay

## 2019-03-18 ENCOUNTER — Telehealth: Payer: Self-pay | Admitting: Gastroenterology

## 2019-03-18 NOTE — Telephone Encounter (Signed)
The patient contacted me but I was not able to speak with him. He left a message saying he cannot travel to [facility] for his EGD next week as he does not have a ride and prefers to wait and have this done locally.   Sherlynn Stalls can you call the patient and touch base with him. Is it just this particular day he cannot go there or in general wants to have all of his care here? I had told him I could speak with Dr. Rush Landmark about doing this, but he books over 6 weeks out and now will have to wait for him to get on that schedule. If that is his preference I can discuss his case with Dr. Rush Landmark again - can you please let me know? Thanks

## 2019-03-18 NOTE — Telephone Encounter (Signed)
Called patient and family member said he was not home. He will call back

## 2019-03-18 NOTE — Telephone Encounter (Signed)
I called the patient back, he is unable to get to [facility] to seek care there due to transportation issue. He no longer has a car, no one can drive him that distance. I will discuss with Dr. Rush Landmark if he can take care of this locally, although the patient understands it may be 6-8 weeks until there is an opening with him and that he may need continued EGDs to keep the lumen open in the interim. I will get back to him next week

## 2019-03-21 NOTE — Telephone Encounter (Signed)
I heard back from Dr. Rush Landmark who has accepted his case.  He will be scheduled to see him in the clinic and then booked for EGD with stent placement at the hospital, this may need to be sutured in place, and due to case backlog may not occur for a few months.  The patient was understanding of this and grateful that we can hopefully take care of this for him. Dr. Donneta Romberg clinic will be contacting him for scheduling.  In the interim he will need another EGD in a week or so to maintain luminal patency until the stent can be placed.  Sherlynn Stalls can you please contact the patient and schedule him for an EGD next week if he is willing.  Thanks

## 2019-03-22 ENCOUNTER — Other Ambulatory Visit: Payer: Self-pay

## 2019-03-22 ENCOUNTER — Telehealth: Payer: Self-pay

## 2019-03-22 DIAGNOSIS — K222 Esophageal obstruction: Secondary | ICD-10-CM

## 2019-03-22 NOTE — Telephone Encounter (Signed)
Scheduled patient for EGD w/dilation on 03/30/19 at 3:30pm. Patient to arrive at 2:30pm and NPO after midnight, except clear liquids up to 4 hours before procedure. COVID testing scheduled for 03/28/19 at 3:00pm at [address]. Patient agrees with the above.

## 2019-03-22 NOTE — Telephone Encounter (Signed)
05/12/19 at 910 am ROV with Dr Rush Landmark.  The pt has been advised

## 2019-03-22 NOTE — Telephone Encounter (Signed)
-----   Message from Irving Copas., MD sent at 03/21/2019  3:10 PM EST ----- Regarding: RE: esophageal stent Steve,Sounds like a plan I had be happy to see him.Patsey Pitstick please schedule an outpatient clinic visit in the coming weeks to discuss esophageal stent placement.Please move forward with trying to schedule a EGD with stent placement 75-minute procedure time next available.Thanks.GM ----- Message ----- From: Yetta Flock, MD Sent: 03/21/2019  11:08 AM EST To: Irving Copas., MD Subject: esophageal stent                               Sloan Leiter,  I've spoken with you previously about this guy, severe esophageal stricture that continues to recur despite kenologue injections and serial dilation. I had him set up for esophageal stent placement at [facility] for this week, I had been dilating him every 2 weeks in the interim to maintain his lumen for the stent. Now he calls and states he has no way to get to [facility] for transportation, can't go there anymore, and wants to do it locally if you can help him out. Unfortunately it took him a long time to get scheduled there. I told him you are booked through the year and this may take a few months to get scheduled which he was fine with. I can continue to dilate him every 2 weeks or so. I have gotten him up to 57mm recently which is the best he has been.   Thanks for your consideration, let me know what you think. If you don't want to do this let me know and I'll see if he is willing to go elsewhere.  Richardson Landry

## 2019-03-28 ENCOUNTER — Other Ambulatory Visit: Payer: Self-pay | Admitting: Gastroenterology

## 2019-03-28 ENCOUNTER — Ambulatory Visit (INDEPENDENT_AMBULATORY_CARE_PROVIDER_SITE_OTHER): Payer: Medicaid Other

## 2019-03-28 DIAGNOSIS — Z1159 Encounter for screening for other viral diseases: Secondary | ICD-10-CM

## 2019-03-28 DIAGNOSIS — Z03818 Encounter for observation for suspected exposure to other biological agents ruled out: Secondary | ICD-10-CM | POA: Diagnosis not present

## 2019-03-29 ENCOUNTER — Ambulatory Visit (INDEPENDENT_AMBULATORY_CARE_PROVIDER_SITE_OTHER): Payer: Medicaid Other | Admitting: Internal Medicine

## 2019-03-29 VITALS — BP 167/96 | HR 61 | Temp 97.6°F | Wt 162.0 lb

## 2019-03-29 DIAGNOSIS — I10 Essential (primary) hypertension: Secondary | ICD-10-CM | POA: Diagnosis not present

## 2019-03-29 DIAGNOSIS — F1120 Opioid dependence, uncomplicated: Secondary | ICD-10-CM

## 2019-03-29 DIAGNOSIS — Z931 Gastrostomy status: Secondary | ICD-10-CM

## 2019-03-29 DIAGNOSIS — K222 Esophageal obstruction: Secondary | ICD-10-CM | POA: Diagnosis not present

## 2019-03-29 DIAGNOSIS — F1121 Opioid dependence, in remission: Secondary | ICD-10-CM

## 2019-03-29 LAB — SARS CORONAVIRUS 2 (TAT 6-24 HRS): SARS Coronavirus 2: NEGATIVE

## 2019-03-29 NOTE — Assessment & Plan Note (Signed)
Patient was seen in [place] clinic today. Instructed to follow up with PCP as scheduled for 12/2 for BP check and management.

## 2019-03-29 NOTE — Progress Notes (Signed)
   03/29/2019  Vincent Clarke presents for follow up of opioid use disorder I have reviewed the prior induction visit, follow up visits, and telephone encounters relevant to opiate use disorder (OUD) treatment.   Current daily dose: Suboxone 8 mg twice daily  Date of Induction: 05/14/18 Current follow up interval, in weeks: 3 weeks  The patient has been adherent with the buprenorphine for OUD contract.   Last UDS Result: Norbuprenorphine, Methamphetamine,Amphetamine, Codeine, Morphine, Morphine, Hydromorphone, Normorphine, Fentanyl, Norfentanyl  HPI: [name] is a 53 year old male, here for follow-up of opiate use disorder. He has extensive history of GI issues: esophageal stricture required multiple procedures and PEG tube placement for feeding. He had trouble with Suboxone use consistency and has had relapse before and considered to go to inpatient rehab for higher level of care. Today he mentions that he has had some progress on his medical (GI issues), he has started some PO diet and gained some weight and he feels that it has helped him on more consistency with Suboxone and less relapse. He reports one episode of relapse on 4 days ago (Inhaled Heroin 2 times that day) due to depressed mood. He kept taking Suboxone though, denies withdrawal symptoms.  Exam:   Vitals:   03/29/19 0940  BP: (!) 167/96  Pulse: 61  Temp: 97.6 F (36.4 C)  TempSrc: Oral  SpO2: 98%  Weight: 162 lb (73.5 kg)   Physical Exam Constitutional:      General: He is not in acute distress.    Appearance: Normal appearance.  Cardiovascular:     Rate and Rhythm: Normal rate and regular rhythm.     Heart sounds: Normal heart sounds. No murmur.  Pulmonary:     Effort: Pulmonary effort is normal.     Breath sounds: Normal breath sounds. No wheezing or rales.  Musculoskeletal:        General: No swelling.     Right lower leg: No edema.     Left lower leg: No edema.  Skin:    Findings: No lesion or  rash.  Neurological:     Mental Status: He is alert and oriented to person, place, and time. Mental status is at baseline.  Psychiatric:        Mood and Affect: Mood normal.        Behavior: Behavior normal.    Assessment/Plan:  See Problem Based Charting in the Encounters Tab  Dewayne Hatch, MD  03/29/2019  9:56 AM

## 2019-03-29 NOTE — Patient Instructions (Addendum)
It was our pleasure taking care of you in our clinic today.  I am glad that you are doing better. Please take Suboxone twice a day as before. Call us for refill few days before running out of it.  Please take rest of your medications as before and contact us if you have any question or concern.   Follow up with your primary care (You have an appointment on December second) for blood pressure management and also to discuss starting medication for depression.   Please let us know if your depression gets worse meanwhile.  Please come back to opioid use clinic in 3 weeks or earlier if needed. Thank you

## 2019-03-29 NOTE — Assessment & Plan Note (Signed)
Mr. Moncus reports adherence with Suboxone treatment. He says that he had one relapse 4 days ago due due to depressed mood. He feels that he has done a better job on adherence to Suboxone and also has had less relapses this month.) He denies withdrawal symptoms and craving now.  - We will do UDS today to check adherence to Suboxone. - Follow up in clinic in 3 weeks. Patient will call us meanwhile for refill

## 2019-03-30 ENCOUNTER — Telehealth: Payer: Self-pay | Admitting: Gastroenterology

## 2019-03-30 ENCOUNTER — Encounter: Payer: Medicaid Other | Admitting: Gastroenterology

## 2019-03-30 NOTE — Telephone Encounter (Signed)
Okay please reschedule at his earliest convenience.

## 2019-03-31 ENCOUNTER — Other Ambulatory Visit: Payer: Self-pay | Admitting: Internal Medicine

## 2019-03-31 MED ORDER — BUPRENORPHINE HCL-NALOXONE HCL 8-2 MG SL FILM: 1.0000 | ORAL_FILM | Freq: Two times a day (BID) | SUBLINGUAL | 0 refills | Status: DC

## 2019-03-31 NOTE — Telephone Encounter (Signed)
Needs refill on Buprenorphine HCl-Naloxone HCl 8-2 MG FILM  ;pt contact [place], [place].

## 2019-04-02 LAB — TOXASSURE SELECT,+ANTIDEPR,UR

## 2019-04-04 MED FILL — SUBOXONE 8 MG-2 MG SL FILM: 8-2 | 21 days supply | Qty: 42 | Fill #0

## 2019-04-05 NOTE — Progress Notes (Signed)
Internal Medicine Clinic Attending  I saw and evaluated the patient.  I personally confirmed the key portions of the history and exam documented by Dr. Masoudi and I reviewed pertinent patient test results.  The assessment, diagnosis, and plan were formulated together and I agree with the documentation in the resident's note. 

## 2019-04-10 DIAGNOSIS — R1310 Dysphagia, unspecified: Secondary | ICD-10-CM | POA: Diagnosis not present

## 2019-04-10 DIAGNOSIS — K222 Esophageal obstruction: Secondary | ICD-10-CM | POA: Diagnosis not present

## 2019-04-12 DIAGNOSIS — R1310 Dysphagia, unspecified: Secondary | ICD-10-CM | POA: Diagnosis not present

## 2019-04-12 DIAGNOSIS — K222 Esophageal obstruction: Secondary | ICD-10-CM | POA: Diagnosis not present

## 2019-04-12 MED FILL — PANTOPRAZOLE SOD DR 40 MG T: 40 | 15 days supply | Qty: 60 | Fill #4

## 2019-04-13 ENCOUNTER — Other Ambulatory Visit: Payer: Self-pay

## 2019-04-13 ENCOUNTER — Encounter: Payer: Self-pay | Admitting: Internal Medicine

## 2019-04-13 ENCOUNTER — Ambulatory Visit: Payer: Medicaid Other | Admitting: Internal Medicine

## 2019-04-13 VITALS — BP 155/83 | HR 76 | Temp 98.3°F | Ht 65.0 in | Wt 163.6 lb

## 2019-04-13 DIAGNOSIS — Z931 Gastrostomy status: Secondary | ICD-10-CM | POA: Diagnosis not present

## 2019-04-13 DIAGNOSIS — Z79899 Other long term (current) drug therapy: Secondary | ICD-10-CM | POA: Diagnosis not present

## 2019-04-13 DIAGNOSIS — E876 Hypokalemia: Secondary | ICD-10-CM

## 2019-04-13 DIAGNOSIS — F419 Anxiety disorder, unspecified: Secondary | ICD-10-CM

## 2019-04-13 DIAGNOSIS — F17210 Nicotine dependence, cigarettes, uncomplicated: Secondary | ICD-10-CM | POA: Diagnosis not present

## 2019-04-13 DIAGNOSIS — K222 Esophageal obstruction: Secondary | ICD-10-CM | POA: Diagnosis not present

## 2019-04-13 DIAGNOSIS — F1190 Opioid use, unspecified, uncomplicated: Secondary | ICD-10-CM

## 2019-04-13 DIAGNOSIS — F5105 Insomnia due to other mental disorder: Secondary | ICD-10-CM

## 2019-04-13 DIAGNOSIS — I10 Essential (primary) hypertension: Secondary | ICD-10-CM

## 2019-04-13 MED ORDER — TRAZODONE HCL 50 MG PO TABS: 25.0000 mg | ORAL_TABLET | Freq: Every evening | ORAL | 2 refills | Status: DC | PRN

## 2019-04-13 MED ORDER — CITALOPRAM HYDROBROMIDE 10 MG PO TABS: 10.0000 mg | ORAL_TABLET | Freq: Every day | ORAL | 2 refills | Status: DC

## 2019-04-13 MED ORDER — LISINOPRIL 20 MG PO TABS: 20.0000 mg | ORAL_TABLET | Freq: Every day | ORAL | 2 refills | Status: DC

## 2019-04-13 MED FILL — traZODone HCL 50 MG TABS: 50 | 30 days supply | Qty: 30 | Fill #0

## 2019-04-13 MED FILL — LISINOPRIL 20 MG TABLET: 20 | 30 days supply | Qty: 30 | Fill #0

## 2019-04-13 MED FILL — CITALOPRAM HBR 10 MG TABLET: 10 | 30 days supply | Qty: 30 | Fill #0

## 2019-04-13 NOTE — Patient Instructions (Signed)
Mr. Poffenberger,  It was a pleasure to meet you!   For your blood pressure, I have started you on a medication called lisinopril. Please take this once a day. If your blood work is abnormal, I will give you a call. Otherwise plan to follow up with me again in 3 months to have your blood pressure and blood work rechecked.  For your anxiety and insomnia, I have started two medications. The first one is called citalopram (aka celexa). This medication will take 4-6 weeks to become effective. Please take this everyday. Trazodone is a medication you can take as needed for sleep. You can take 1/2 a tablet or a whole tablet every night as needed.  Please make an appointment to follow up with me again in 3 months or sooner if you have any problems. If you have any questions or concerns, call our clinic at [phone] or after hours call [phone] and ask for the internal medicine resident on call. Thank you!  Dr. Philipp Ovens

## 2019-04-13 NOTE — Assessment & Plan Note (Addendum)
Patient is here to establish care with me. He has a history of OUD and has been receiving treatment in our suboxone clinic. BP was noted to be high at prior visits and he is here today for follow up. BP is persistently high today, 162/86 and 155/83 on recheck. He reports a history of HTN previously on antihypertensive therapy but this was stopped for unclear reasons. He has had significant weight loss over the past year due to complications from his severe esophageal stricture and poor po intake, but has recently started gaining weight again which he is happy about. I suspect his weight loss was the reason for discontinuation of his BP meds. He is agreeable to starting therapy today. He has normal renal function on BMP checked 1 month ago. He did have mild hypokalemia of 3.0. I suspect 2/2 to his PEG tube feeds and poor absorption or lack of adequate intake. I will repeat labs today and again at follow up. Will start lisinopril 20 mg daily which may help with his hypokalemia as well. He was counseled on risks of ACEi and instructed to call if he develops any side effects. -- Start lisinopril 20 mg daily  -- BMP today  -- Follow up 3 months   ADDENDUM: BMP within normal limits; hypokalemia resolved. Repeat at follow up.

## 2019-04-13 NOTE — Assessment & Plan Note (Addendum)
Patient endorses symptoms of uncontrolled anxiety and insomnia. He reports excessive worry about his health with his esophageal stricture and upcoming stent procedure, conern about his hospital bill and finances not being able to work, and worry about his opioid use disorder. He lays awake at night worrying about multiple issues in his life and is unable to sleep. He cites anxiety has the main reason for his relapses, not his esophageal pain which has been manageable. We discussed the risks and benefits of SSRI therapy and patient would like to start treatment. His GAD 7 score today is 14, consistent with moderate to severe anxiety. He knows that the medication will take 4-6 weeks to become effective. He would like to try something for sleep. Will start low dose trazodone as needed while we wait for his SSRI to become effective.   -- Start citalopram 10 mg daily -- Start trazodone 25-50 mg QHS PRN -- Follow up 3 months
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
## 2019-04-14 LAB — BMP8+ANION GAP
Anion Gap: 14 mmol/L (ref 10.0–18.0)
BUN/Creatinine Ratio: 17 (ref 9–20)
BUN: 10 mg/dL (ref 6–24)
CO2: 24 mmol/L (ref 20–29)
Calcium: 9.2 mg/dL (ref 8.7–10.2)
Chloride: 101 mmol/L (ref 96–106)
Creatinine, Ser: 0.59 mg/dL — ABNORMAL LOW (ref 0.76–1.27)
GFR calc Af Amer: 133 mL/min/{1.73_m2} (ref >59)
GFR calc non Af Amer: 115 mL/min/{1.73_m2} (ref >59)
Glucose: 76 mg/dL (ref 65–99)
Potassium: 4.1 mmol/L (ref 3.5–5.2)
Sodium: 139 mmol/L (ref 134–144)

## 2019-04-19 ENCOUNTER — Other Ambulatory Visit: Payer: Self-pay

## 2019-04-19 ENCOUNTER — Ambulatory Visit (INDEPENDENT_AMBULATORY_CARE_PROVIDER_SITE_OTHER): Payer: Medicaid Other | Admitting: Internal Medicine

## 2019-04-19 VITALS — BP 141/73 | HR 63 | Temp 98.7°F | Ht 65.0 in | Wt 163.0 lb

## 2019-04-19 DIAGNOSIS — F1121 Opioid dependence, in remission: Secondary | ICD-10-CM

## 2019-04-19 DIAGNOSIS — F1120 Opioid dependence, uncomplicated: Secondary | ICD-10-CM

## 2019-04-19 MED ORDER — BUPRENORPHINE HCL-NALOXONE HCL 8-2 MG SL FILM: 1.0000 | ORAL_FILM | Freq: Two times a day (BID) | SUBLINGUAL | 0 refills | Status: DC

## 2019-04-19 NOTE — Assessment & Plan Note (Signed)
Repeat U tox today Reviewed database Continue Suboxone 8-2 twice daily We will have him follow-up in about 3 to 4 weeks.

## 2019-04-19 NOTE — Progress Notes (Signed)
   04/19/2019  Vincent Clarke presents for follow up of opioid use disorder I have reviewed the prior induction visit, follow up visits, and telephone encounters relevant to opiate use disorder (OUD) treatment.   Current daily dose: Suboxone 8 mg twice daily  Date of Induction: 05/14/18 Current follow up interval, in weeks: 3 weeks  The patient has been adherent with the buprenorphine for OUD contract.   Last UDS Result: Buprenorphine, Norbuprenorphine, + non prescribed opioids and cocaine  HPI: [name] is a 53 year old male, here for follow-up of opiate use disorder.  He reports that over the past 3 weeks he has had good adherence to Suboxone twice daily.  His cravings have been well controlled he expects his urine to show Suboxone and possibly THC.  He reports that he is to undergo repeat dilatation of his esophagus and he has plans for a procedure that may provide significant relief.  He is really working to continue his abstinence from other opioid medications.  Exam:   Vitals:   04/19/19 0927  Weight: 163 lb (73.9 kg)  Height: 5\' 5"  (1.651 m)   General: NAD, well groomed Psych: good eye contact, appropriate affect  Assessment/Plan:  See Problem Based Charting in the Encounters Tab  Vincent Groves, DO  04/19/2019  9:40 AM

## 2019-04-20 ENCOUNTER — Other Ambulatory Visit: Payer: Self-pay

## 2019-04-20 DIAGNOSIS — Z1159 Encounter for screening for other viral diseases: Secondary | ICD-10-CM

## 2019-04-22 ENCOUNTER — Ambulatory Visit (INDEPENDENT_AMBULATORY_CARE_PROVIDER_SITE_OTHER): Payer: Medicaid Other

## 2019-04-22 DIAGNOSIS — Z1159 Encounter for screening for other viral diseases: Secondary | ICD-10-CM | POA: Diagnosis not present

## 2019-04-22 LAB — TOXASSURE SELECT,+ANTIDEPR,UR

## 2019-04-25 LAB — SARS CORONAVIRUS 2 (TAT 6-24 HRS): SARS Coronavirus 2: NEGATIVE

## 2019-04-25 MED FILL — SUBOXONE 8 MG-2 MG SL FILM: 8-2 | 42 days supply | Qty: 42 | Fill #0

## 2019-04-26 ENCOUNTER — Encounter: Payer: Self-pay | Admitting: Gastroenterology

## 2019-04-26 ENCOUNTER — Other Ambulatory Visit: Payer: Self-pay

## 2019-04-26 ENCOUNTER — Ambulatory Visit (AMBULATORY_SURGERY_CENTER): Payer: Medicaid Other | Admitting: Gastroenterology

## 2019-04-26 VITALS — BP 115/66 | HR 65 | Temp 98.2°F | Resp 15 | Ht 65.0 in | Wt 163.0 lb

## 2019-04-26 DIAGNOSIS — B3781 Candidal esophagitis: Secondary | ICD-10-CM | POA: Diagnosis not present

## 2019-04-26 DIAGNOSIS — R1310 Dysphagia, unspecified: Secondary | ICD-10-CM

## 2019-04-26 DIAGNOSIS — K222 Esophageal obstruction: Secondary | ICD-10-CM

## 2019-04-26 DIAGNOSIS — K228 Other specified diseases of esophagus: Secondary | ICD-10-CM | POA: Diagnosis not present

## 2019-04-26 DIAGNOSIS — K449 Diaphragmatic hernia without obstruction or gangrene: Secondary | ICD-10-CM

## 2019-04-26 DIAGNOSIS — K2270 Barrett's esophagus without dysplasia: Secondary | ICD-10-CM | POA: Diagnosis not present

## 2019-04-26 DIAGNOSIS — K219 Gastro-esophageal reflux disease without esophagitis: Secondary | ICD-10-CM | POA: Diagnosis not present

## 2019-04-26 DIAGNOSIS — J45909 Unspecified asthma, uncomplicated: Secondary | ICD-10-CM | POA: Diagnosis not present

## 2019-04-26 MED ORDER — SODIUM CHLORIDE 0.9 % IV SOLN: 500.0000 mL | Freq: Once | INTRAVENOUS | Status: DC

## 2019-04-26 NOTE — Progress Notes (Signed)
Called to room to assist during endoscopic procedure.  Patient ID and intended procedure confirmed with present staff. Received instructions for my participation in the procedure from the performing physician.  

## 2019-04-26 NOTE — Op Note (Signed)
[facility] Patient Name: Vincent Clarke Procedure Date: 04/26/2019 10:17 AM MRN: [id number] Endoscopist: Viviann SpareSteven P. Adela LankArmbruster , MD Age: [age] Referring MD:  Date of Birth: 01/18/1966 Gender: Male Account #: [id number] Procedure:                Upper GI endoscopy Indications:              For therapy of esophageal stricture - history of                            refractory esophageal stricture at 30cm, [facility], has had repeated Savory                            dilations to 15mm however restenosis occurs despite                            high dose PPI. [facility]                            [facility] stent placement last month, could not                            follow up due to transportation issues. Has a                            pending consult 12/31 with Dr. Bernie CoveyManasouraty for                            possible stent placement. Here for repeat dilation                            to keep lumen patent. Last EGD on 03/14/19 with 15mm                            dilation. Also with history of Barrett's esophagus. Medicines:                Monitored Anesthesia Care Procedure:                Pre-Anesthesia Assessment:                           - Prior to the procedure, a History and Physical                            was performed, and patient medications and                            allergies were reviewed. The patient's tolerance of                            previous anesthesia was also reviewed. The risks  and benefits of the procedure and the sedation                            options and risks were discussed with the patient.                            All questions were answered, and informed consent                            was obtained. Prior Anticoagulants: The patient has                            taken no previous anticoagulant or antiplatelet   agents. ASA Grade Assessment: II - A patient with                            mild systemic disease. After reviewing the risks                            and benefits, the patient was deemed in                            satisfactory condition to undergo the procedure.                           After obtaining informed consent, the endoscope was                            passed under direct vision. Throughout the                            procedure, the patient's blood pressure, pulse, and                            oxygen saturations were monitored continuously. The                            Endoscope was introduced through the mouth, and                            advanced to the second part of duodenum. The upper                            GI endoscopy was accomplished without difficulty.                            The patient tolerated the procedure well. Scope In: Scope Out: Findings:                 Esophagogastric landmarks were identified: the                            gastroesophageal junction was found at 35 cm and  the upper extent of the gastric folds was found at                            38 cm from the incisors.                           A 3 cm hiatal hernia was present.                           Barrett's esophagus was present in the lower third                            of the esophagus, although was difficult to see a                            clear zline and if the Barrett's has involved the                            stricture. Biopsies were taken with a cold forceps                            for histology of the lower esophagus..                           One benign-appearing, intrinsic moderate stenosis                            was found 30 cm from the incisors, a few cm's in                            length The stenosis was traversed with the                            endoscope which caused small mucosal wrents. A                             guidewire was placed and the scope was withdrawn.                            Dilation was performed with a Savary dilator with                            mild resistance at 11 mm, 13 mm and 15 mm. Relook                            endoscopy showed appropriate mucosal wrents.                            Biopsies were taken with a cold forceps for                            histology of the stricture.  The exam of the esophagus was otherwise normal.                           Biopsies were taken with a cold forceps of the                            esophagus just proximal to the stricture for                            histology.                           There was evidence of a PEG tube present in the                            gastric body.                           The exam of the stomach was otherwise normal                            following suctioning of residual liquids                           The duodenal bulb and second portion of the                            duodenum were normal. Complications:            No immediate complications. Estimated blood loss:                            Minimal. Estimated Blood Loss:     Estimated blood loss was minimal. Impression:               - Esophagogastric landmarks identified.                           - 3 cm hiatal hernia.                           - Barrett's esophagus - difficult to appreciate                            clear SCJ on this exam. Biopsied distal esophagus,                            stricture, and just proximal to the stricture.                           - Benign-appearing esophageal stenosis. Dilated to                            15mm. Biopsied.                           - Gastrostomy present.                           -  Normal duodenal bulb and second portion of the                            duodenum. Recommendation:           - Patient has a contact number available for                             emergencies. The signs and symptoms of potential                            delayed complications were discussed with the                            patient. Return to normal activities tomorrow.                            Written discharge instructions were provided to the                            patient.                           - Full liquid diet.                           - Continue present medications (protonix 80mg  twice                            daily).                           - Await pathology results.                           - Pending consult with Dr. on 12/31 for                            possible stent placement. I will continue to dilate                            periodically to keep lumen patent until stenting                            can occur (if amenable) 1/32 P. Deysi Soldo, MD 04/26/2019 10:56:06 AM This report has been signed electronically.

## 2019-04-26 NOTE — Progress Notes (Signed)
Vitals-Vincent Clarke Temp-CH  Pt's states no medical or surgical changes since previsit or office visit. 

## 2019-04-26 NOTE — Patient Instructions (Addendum)
YOU HAD AN ENDOSCOPIC PROCEDURE TODAY AT [facility]:   Refer to the procedure report that was given to you for any specific questions about what was found during the examination.  If the procedure report does not answer your questions, please call your gastroenterologist to clarify.  If you requested that your care partner not be given the details of your procedure findings, then the procedure report has been included in a sealed envelope for you to review at your convenience later.  YOU SHOULD EXPECT: Some feelings of bloating in the abdomen. Passage of more gas than usual.  Walking can help get rid of the air that was put into your GI tract during the procedure and reduce the bloating. If you had a lower endoscopy (such as a colonoscopy or flexible sigmoidoscopy) you may notice spotting of blood in your stool or on the toilet paper. If you underwent a bowel prep for your procedure, you may not have a normal bowel movement for a few days.  Please Note:  You might notice some irritation and congestion in your nose or some drainage.  This is from the oxygen used during your procedure.  There is no need for concern and it should clear up in a day or so.  SYMPTOMS TO REPORT IMMEDIATELY:    Following upper endoscopy (EGD)  Vomiting of blood or coffee ground material  New chest pain or pain under the shoulder blades  Painful or persistently difficult swallowing  New shortness of breath  Fever of 100F or higher  Black, tarry-looking stools  For urgent or emergent issues, a gastroenterologist can be reached at any hour by calling [phone].   DIET:  Please stay on a FULL LIQUID DIET though your gastric tube..  Handout given to you.  You should avoid alcoholic beverages.  ACTIVITY:  You should plan to take it easy for the rest of today and you should NOT DRIVE or use heavy machinery until tomorrow (because of the sedation medicines used during the test).    FOLLOW UP: Our  staff will call the number listed on your records 48-72 hours following your procedure to check on you and address any questions or concerns that you may have regarding the information given to you following your procedure. If we do not reach you, we will leave a message.  We will attempt to reach you two times.  During this call, we will ask if you have developed any symptoms of COVID 19. If you develop any symptoms (ie: fever, flu-like symptoms, shortness of breath, cough etc.) before then, please call [phone].  If you test positive for Covid 19 in the 2 weeks post procedure, please call and report this information to [place].    If any biopsies were taken you will be contacted by phone or by letter within the next 1-3 weeks.  Please call us at [phone] if you have not heard about the biopsies in 3 weeks.    SIGNATURES/CONFIDENTIALITY: You and/or your care partner have signed paperwork which will be entered into your electronic medical record.  These signatures attest to the fact that that the information above on your After Visit Summary has been reviewed and is understood.  Full responsibility of the confidentiality of this discharge information lies with you and/or your care-partner.   Handouts were given to your care partner on the a hiatal hernia, Barrett's esophagus and a full liquid diet. You may resume your current medications today. Await biopsy results.  Pending consult with Dr. Meridee Score on 12/31 for possible stent placement.  Dr. Adela Lank will continue to dilate periodically to keep lumen open until stenting can occur if amenable. Please call if any questions or concerns.

## 2019-04-26 NOTE — Progress Notes (Signed)
No problems noted in the recovery room. maw 

## 2019-04-28 ENCOUNTER — Telehealth: Payer: Self-pay

## 2019-04-28 NOTE — Telephone Encounter (Signed)
LVM

## 2019-04-28 NOTE — Telephone Encounter (Signed)
  Follow up Call-  Call back number 04/26/2019 03/14/2019 02/23/2019 02/10/2019 01/31/2019 01/14/2019 12/23/2018  Post procedure Call Back phone  # [phone]  Permission to leave phone message Yes Yes Yes Yes Yes Yes Yes  Some recent data might be hidden     Patient questions:  Do you have a fever, pain , or abdominal swelling? No. Pain Score  0 *  Have you tolerated food without any problems? Yes.    Have you been able to return to your normal activities? Yes.    Do you have any questions about your discharge instructions: Diet   No. Medications  No. Follow up visit  No.  Do you have questions or concerns about your Care? No.  Actions: * If pain score is 4 or above: No action needed, pain <4.  1. Have you developed a fever since your procedure? No  2.   Have you had an respiratory symptoms (SOB or cough) since your procedure? No  3.   Have you tested positive for COVID 19 since your procedure No  4.   Have you had any family members/close contacts diagnosed with the COVID 19 since your procedure?  No   If yes to any of these questions please route to Joylene John, RN and Alphonsa Gin, RN.

## 2019-05-02 ENCOUNTER — Other Ambulatory Visit: Payer: Self-pay

## 2019-05-02 DIAGNOSIS — K222 Esophageal obstruction: Secondary | ICD-10-CM

## 2019-05-02 MED ORDER — FLUCONAZOLE 200 MG PO TABS: 200.0000 mg | ORAL_TABLET | Freq: Every day | ORAL | 0 refills | Status: DC

## 2019-05-02 MED FILL — FLUCONAZOLE 200 MG TABLET: 200 | 14 days supply | Qty: 15 | Fill #0

## 2019-05-05 DIAGNOSIS — K222 Esophageal obstruction: Secondary | ICD-10-CM | POA: Diagnosis not present

## 2019-05-05 DIAGNOSIS — R1310 Dysphagia, unspecified: Secondary | ICD-10-CM | POA: Diagnosis not present

## 2019-05-09 MED FILL — PANTOPRAZOLE SOD DR 40 MG T: 40 | 15 days supply | Qty: 60 | Fill #5

## 2019-05-12 ENCOUNTER — Encounter: Payer: Self-pay | Admitting: Gastroenterology

## 2019-05-12 ENCOUNTER — Other Ambulatory Visit (INDEPENDENT_AMBULATORY_CARE_PROVIDER_SITE_OTHER): Payer: Medicaid Other

## 2019-05-12 ENCOUNTER — Ambulatory Visit: Payer: Medicaid Other | Admitting: Gastroenterology

## 2019-05-12 VITALS — BP 100/62 | HR 64 | Temp 97.8°F | Ht 66.25 in | Wt 163.1 lb

## 2019-05-12 DIAGNOSIS — K222 Esophageal obstruction: Secondary | ICD-10-CM | POA: Diagnosis not present

## 2019-05-12 DIAGNOSIS — R1310 Dysphagia, unspecified: Secondary | ICD-10-CM

## 2019-05-12 DIAGNOSIS — R1319 Other dysphagia: Secondary | ICD-10-CM

## 2019-05-12 LAB — BASIC METABOLIC PANEL
BUN: 8 mg/dL (ref 6–23)
CO2: 26 mEq/L (ref 19–32)
Calcium: 9.3 mg/dL (ref 8.4–10.5)
Chloride: 104 mEq/L (ref 96–112)
Creatinine, Ser: 0.76 mg/dL (ref 0.40–1.50)
GFR: 106.65 mL/min (ref >60.00)
Glucose, Bld: 104 mg/dL — ABNORMAL HIGH (ref 70–99)
Potassium: 4 mEq/L (ref 3.5–5.1)
Sodium: 138 mEq/L (ref 135–145)

## 2019-05-12 LAB — CBC
HCT: 42.6 % (ref 39.0–52.0)
Hemoglobin: 14.4 g/dL (ref 13.0–17.0)
MCHC: 33.7 g/dL (ref 30.0–36.0)
MCV: 90.6 fl (ref 78.0–100.0)
Platelets: 298 10*3/uL (ref 150.0–400.0)
RBC: 4.7 Mil/uL (ref 4.22–5.81)
RDW: 13.2 % (ref 11.5–15.5)
WBC: 10.9 10*3/uL — ABNORMAL HIGH (ref 4.0–10.5)

## 2019-05-12 LAB — PROTIME-INR
INR: 1 ratio (ref 0.8–1.0)
Prothrombin Time: 12.1 s (ref 9.6–13.1)

## 2019-05-12 MED ORDER — AMBULATORY NON FORMULARY MEDICATION: 0 refills | Status: DC

## 2019-05-12 MED FILL — LISINOPRIL 20 MG TABLET: 20 | 30 days supply | Qty: 30 | Fill #1

## 2019-05-12 NOTE — Patient Instructions (Addendum)
You have been scheduled for an endoscopy. Please follow written instructions given to you at your visit today. If you use inhalers (even only as needed), please bring them with you on the day of your procedure.   Due to recent changes in healthcare laws, you may see the results of your imaging and laboratory studies on MyChart before your provider has had a chance to review them.  We understand that in some cases there may be results that are confusing or concerning to you. Not all laboratory results come back in the same time frame and the provider may be waiting for multiple results in order to interpret others.  Please give [place] 48 hours in order for your provider to thoroughly review all the results before contacting the office for clarification of your results.   Your provider has requested that you go to the basement level for lab work before leaving today. Press "B" on the elevator. The lab is located at the first door on the left as you exit the elevator.  We have sent the following medications to your pharmacy for you to pick up at your convenience: GI cocktail - you will not need to take this until after your scheduled procedure on 05/30/2019.  Thank you for choosing me and [facility].  Dr. Rush Landmark
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
## 2019-05-13 DIAGNOSIS — R1310 Dysphagia, unspecified: Secondary | ICD-10-CM | POA: Diagnosis not present

## 2019-05-13 DIAGNOSIS — K222 Esophageal obstruction: Secondary | ICD-10-CM | POA: Diagnosis not present

## 2019-05-17 ENCOUNTER — Other Ambulatory Visit: Payer: Self-pay

## 2019-05-17 ENCOUNTER — Ambulatory Visit (INDEPENDENT_AMBULATORY_CARE_PROVIDER_SITE_OTHER): Payer: Medicaid Other | Admitting: Student in an Organized Health Care Education/Training Program

## 2019-05-17 ENCOUNTER — Ambulatory Visit (INDEPENDENT_AMBULATORY_CARE_PROVIDER_SITE_OTHER): Payer: Medicaid Other

## 2019-05-17 ENCOUNTER — Other Ambulatory Visit: Payer: Self-pay | Admitting: Gastroenterology

## 2019-05-17 VITALS — BP 150/88 | HR 64 | Temp 98.4°F | Wt 163.2 lb

## 2019-05-17 DIAGNOSIS — F1121 Opioid dependence, in remission: Secondary | ICD-10-CM

## 2019-05-17 DIAGNOSIS — F1120 Opioid dependence, uncomplicated: Secondary | ICD-10-CM

## 2019-05-17 DIAGNOSIS — K222 Esophageal obstruction: Secondary | ICD-10-CM

## 2019-05-17 DIAGNOSIS — Z931 Gastrostomy status: Secondary | ICD-10-CM | POA: Diagnosis not present

## 2019-05-17 DIAGNOSIS — Z1159 Encounter for screening for other viral diseases: Secondary | ICD-10-CM

## 2019-05-17 DIAGNOSIS — F1590 Other stimulant use, unspecified, uncomplicated: Secondary | ICD-10-CM | POA: Diagnosis not present

## 2019-05-17 DIAGNOSIS — F1520 Other stimulant dependence, uncomplicated: Secondary | ICD-10-CM

## 2019-05-17 MED ORDER — BUPRENORPHINE HCL-NALOXONE HCL 8-2 MG SL FILM: 1.0000 | ORAL_FILM | Freq: Two times a day (BID) | SUBLINGUAL | 0 refills | Status: DC

## 2019-05-17 MED FILL — SUBOXONE 8 MG-2 MG SL FILM: 8-2 | 30 days supply | Qty: 60 | Fill #0

## 2019-05-17 NOTE — Assessment & Plan Note (Signed)
Stable.  Reportedly no amphetamine use recently.  No medication assisted treatment available this problem.

## 2019-05-17 NOTE — Patient Instructions (Signed)
Is great seeing you today in clinic.  I sent a 1 month prescription of your Suboxone to the outpatient pharmacy.  We will follow-up with you in 4 weeks with a telephone visit.

## 2019-05-17 NOTE — Progress Notes (Signed)
   Assessment and Plan:  See Encounters tab for problem-based medical decision making.   __________________________________________________________  HPI:   54 year old person who uses drugs here for follow-up of medication assisted treatment.  He reports doing well over the last few weeks.  He has been struggling with esophageal strictures that have required repeat dilations with the GI service.  He is due for a dilation later this week.  He is using a gastric tube for his nutrition, does bolus feeds of a supplements at least 3 times daily and flushes with free water.  He is able to take small amounts of food and liquid by mouth.  He reports good compliance with Suboxone, reports that his cravings are much improved.  Reports using heroin twice in the last 30 days.  Reports improved home environment with fewer people around who are actively using drugs.  No fevers or chills recently.  No recent overdoses.  He is going to go for a advanced endoscopic procedure later this month with a place esophageal stent to hopefully prolong his time between needing dilations.  If this does not work his next step will be a surgery.   __________________________________________________________  Problem List: Patient Active Problem List   Diagnosis Date Noted  . Opioid use disorder, moderate, in early remission [facility])     Priority: High  . Hypokalemia 04/13/2019  . 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 S/P percutaneous endoscopic gastrostomy (PEG) tube placement (HCC) 07/06/2018  . Esophageal stricture   . Esophagitis, erosive 06/05/2018  . HTN (hypertension) 03/21/2014  . GERD (gastroesophageal reflux disease) 03/21/2014  . Tobacco use disorder 03/21/2014    Medications: Reconciled today in Epic __________________________________________________________  Physical Exam:  Vital Signs: Vitals:   05/17/19 0959  BP: (!) 150/88  Pulse: 64  Temp:  98.4 F (36.9 C)  TempSrc: Oral  SpO2: 99%  Weight: 163 lb 3.2 oz (74 kg)    Gen: Well appearing, NAD Neck: No cervical LAD, No thyromegaly or nodules, No JVD. CV: RRR, no murmurs Pulm: Normal effort, CTA throughout, no wheezing Abd: Soft, NT, ND, G-tube in place the left upper quadrant with no erythema around the insertion site, no discharge Ext: Warm, no edema, normal joints

## 2019-05-17 NOTE — Assessment & Plan Note (Signed)
Improving, not yet under control.  Doing much better with adherence to Suboxone.  Heroin use has become infrequent, only 2 times in the last 30 days.  Working on home life.  Still has a lot of stress related to his esophageal disease which will hopefully be improved later this month when he has the sleeve procedure.  We will continue with Suboxone 2 films daily, now has good access to this medicine through Medicaid.  I reviewed the database which was appropriate.  Follow-up in 4 weeks with a telephone visit.

## 2019-05-18 LAB — SARS CORONAVIRUS 2 (TAT 6-24 HRS): SARS Coronavirus 2: NEGATIVE

## 2019-05-19 ENCOUNTER — Other Ambulatory Visit: Payer: Self-pay

## 2019-05-19 ENCOUNTER — Ambulatory Visit (AMBULATORY_SURGERY_CENTER): Payer: Medicaid Other | Admitting: Gastroenterology

## 2019-05-19 ENCOUNTER — Encounter: Payer: Self-pay | Admitting: Gastroenterology

## 2019-05-19 VITALS — BP 105/66 | HR 55 | Temp 97.5°F | Resp 12 | Ht 66.0 in | Wt 163.0 lb

## 2019-05-19 DIAGNOSIS — R1310 Dysphagia, unspecified: Secondary | ICD-10-CM | POA: Diagnosis not present

## 2019-05-19 DIAGNOSIS — K3189 Other diseases of stomach and duodenum: Secondary | ICD-10-CM | POA: Diagnosis not present

## 2019-05-19 DIAGNOSIS — K2270 Barrett's esophagus without dysplasia: Secondary | ICD-10-CM

## 2019-05-19 DIAGNOSIS — K228 Other specified diseases of esophagus: Secondary | ICD-10-CM | POA: Diagnosis not present

## 2019-05-19 DIAGNOSIS — K449 Diaphragmatic hernia without obstruction or gangrene: Secondary | ICD-10-CM | POA: Diagnosis not present

## 2019-05-19 DIAGNOSIS — K222 Esophageal obstruction: Secondary | ICD-10-CM

## 2019-05-19 MED ORDER — SODIUM CHLORIDE 0.9 % IV SOLN: 500.0000 mL | INTRAVENOUS | Status: DC

## 2019-05-19 MED FILL — traZODone HCL 50 MG TABS: 50 | 30 days supply | Qty: 30 | Fill #1

## 2019-05-19 NOTE — Progress Notes (Signed)
To PACU, VSS. Report to Rn.tb 

## 2019-05-19 NOTE — Op Note (Signed)
[place] Patient Name: Vincent Clarke Procedure Date: 05/19/2019 10:36 AM MRN: [id number] Endoscopist: Remo Lipps P. 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                            Patient has a pending appointment with Dr.                            Rush Landmark later this month for stent placement.                            Here for repeat dilation to keep lumen patent. Last                            EGD on 04/26/19 with 48mm dilation. Also with                            history of Barrett's esophagus. Medicines:                Monitored Anesthesia Care Procedure:                Pre-Anesthesia Assessment:                           - Prior to the procedure, a History and Physical                            was performed, and patient medications and                            allergies were reviewed. The patient's tolerance of                            previous anesthesia was also reviewed. The risks                            and benefits of the procedure and the sedation  options and risks were discussed with the patient.                            All questions were answered, and informed consent                            was obtained. Prior Anticoagulants: The patient has                            taken no previous anticoagulant or antiplatelet                            agents. ASA Grade Assessment: III - A patient with                            severe systemic disease.  After reviewing the risks                            and benefits, the patient was deemed in                            satisfactory condition to undergo the procedure.                           After obtaining informed consent, the endoscope was                            passed under direct vision. Throughout the                            procedure, the patient's blood pressure, pulse, and                            oxygen saturations were monitored continuously. The                            Endoscope was introduced through the mouth, and                            advanced to the antrum of the stomach. The upper GI                            endoscopy was accomplished without difficulty. The                            patient tolerated the procedure well. Scope In: Scope Out: Findings:                 Esophagogastric landmarks were identified: the                            Z-line was found at 33 cm, the gastroesophageal  junction was found at 35 cm and the upper extent of                            the gastric folds was found at 38 cm from the                            incisors.                           A 3 cm hiatal hernia was present.                           Barrett's esophagus was present in the lower third                            of the esophagus. The maximum longitudinal extent                            of these mucosal changes was 2 cm in length.                            Recently biopsied without dysplasia.                           One benign-appearing, intrinsic moderate stenosis                            was found 30 cm from the incisors. A small                            diverticulum has developed just proximal to the                            stricture. This stenosis measured 3 cm (in length)                            or so. A guidewire was placed and the scope was                            withdrawn. Dilation was performed with a  Savary                            dilator with mild resistance at 13 mm and 15 mm.                            Relook endoscopy showed appropriate mucosal wrents.                           Localized mucosal changes, suspect benign squamous                            changes, at 24cm from the incisors. Biopsies were  taken with a cold forceps for histology.                           There was evidence of a gastrostomy present in the                            gastric body. This was characterized by healthy                            appearing mucosa. Full exam of the stomach and                            duodenum not performed today given recent                            examination. Complications:            No immediate complications. Estimated blood loss:                            Minimal. Estimated Blood Loss:     Estimated blood loss was minimal. Impression:               - Esophagogastric landmarks identified.                           - 3 cm hiatal hernia.                           - 2cm segment Barrett's esophagus.                           - Benign-appearing esophageal stenosis - continues                            to scar down post dilation, roughly 3cm in length.                            Dilated to 54mm with good result.                           - Mucosal changes at 24cm from the incisors as                            outlined, suspect benign changes. Biopsied.                           - Gastrostomy present characterized by healthy                            appearing mucosa. Recommendation:           - Patient has a contact number available for                            emergencies. The signs and symptoms of potential  delayed complications were discussed with the                            patient. Return to normal activities tomorrow.                            Written discharge instructions were provided to the                             patient.                           - Full liquid / very soft diet.                           - Continue present medications.                           - Await pathology results.                           - Await EGD with stent placement per Dr. Meridee Score                            later this month Willaim Rayas. Tanush Drees, MD 05/19/2019 11:08:07 AM This report has been signed electronically.

## 2019-05-19 NOTE — Patient Instructions (Signed)
FULL LIQUID/VERY SOFT DIET AWAIT EGD WITH STENT PLACEMENT PER DR MANSOURATY LATER THIS MONTH AWAIT PATHOLOGY RESULTS CONTINUE PRESENT MEDS      YOU HAD AN ENDOSCOPIC PROCEDURE TODAY AT THE [facility]:   Refer to the procedure report that was given to you for any specific questions about what was found during the examination.  If the procedure report does not answer your questions, please call your gastroenterologist to clarify.  If you requested that your care partner not be given the details of your procedure findings, then the procedure report has been included in a sealed envelope for you to review at your convenience later.  YOU SHOULD EXPECT: Some feelings of bloating in the abdomen. Passage of more gas than usual.  Walking can help get rid of the air that was put into your GI tract during the procedure and reduce the bloating. If you had a lower endoscopy (such as a colonoscopy or flexible sigmoidoscopy) you may notice spotting of blood in your stool or on the toilet paper. If you underwent a bowel prep for your procedure, you may not have a normal bowel movement for a few days.  Please Note:  You might notice some irritation and congestion in your nose or some drainage.  This is from the oxygen used during your procedure.  There is no need for concern and it should clear up in a day or so.  SYMPTOMS TO REPORT IMMEDIATELY:    Following upper endoscopy (EGD)  Vomiting of blood or coffee ground material  New chest pain or pain under the shoulder blades  Painful or persistently difficult swallowing  New shortness of breath  Fever of 100F or higher  Black, tarry-looking stools  For urgent or emergent issues, a gastroenterologist can be reached at any hour by calling [phone].   DIET:  We do recommend a small meal at first, but then you may proceed to your regular diet.  Drink plenty of fluids but you should avoid alcoholic beverages for 24 hours.  ACTIVITY:  You  should plan to take it easy for the rest of today and you should NOT DRIVE or use heavy machinery until tomorrow (because of the sedation medicines used during the test).    FOLLOW UP: Our staff will call the number listed on your records 48-72 hours following your procedure to check on you and address any questions or concerns that you may have regarding the information given to you following your procedure. If we do not reach you, we will leave a message.  We will attempt to reach you two times.  During this call, we will ask if you have developed any symptoms of COVID 19. If you develop any symptoms (ie: fever, flu-like symptoms, shortness of breath, cough etc.) before then, please call [phone].  If you test positive for Covid 19 in the 2 weeks post procedure, please call and report this information to [place].    If any biopsies were taken you will be contacted by phone or by letter within the next 1-3 weeks.  Please call us at [phone] if you have not heard about the biopsies in 3 weeks.    SIGNATURES/CONFIDENTIALITY: You and/or your care partner have signed paperwork which will be entered into your electronic medical record.  These signatures attest to the fact that that the information above on your After Visit Summary has been reviewed and is understood.  Full responsibility of the confidentiality of this discharge information lies with  you and/or your care-partner.

## 2019-05-19 NOTE — Progress Notes (Signed)
Called to room to assist during endoscopic procedure.  Patient ID and intended procedure confirmed with present staff. Received instructions for my participation in the procedure from the performing physician.  

## 2019-05-19 NOTE — Progress Notes (Addendum)
Pt's states no medical or surgical changes since previsit or office visit./ last procedure with Dr Adela Lank

## 2019-05-19 NOTE — Progress Notes (Signed)
Temp per June VS per Courtney 

## 2019-05-23 ENCOUNTER — Telehealth: Payer: Self-pay

## 2019-05-23 NOTE — Telephone Encounter (Signed)
  Follow up Call-  Call back number 05/19/2019 04/26/2019 03/14/2019 02/23/2019 02/10/2019 01/31/2019 01/14/2019  Post procedure Call Back phone  # [phone]  Permission to leave phone message Yes Yes Yes Yes Yes Yes Yes  Some recent data might be hidden     Patient questions:  Do you have a fever, pain , or abdominal swelling? No. Pain Score  0 *  Have you tolerated food without any problems? Yes.    Have you been able to return to your normal activities? Yes.    Do you have any questions about your discharge instructions: Diet   No. Medications  No. Follow up visit  No.  Do you have questions or concerns about your Care? No.  Actions: * If pain score is 4 or above: No action needed, pain <4.  1. Have you developed a fever since your procedure? no  2.   Have you had an respiratory symptoms (SOB or cough) since your procedure? no  3.   Have you tested positive for COVID 19 since your procedure no  4.   Have you had any family members/close contacts diagnosed with the COVID 19 since your procedure?  no   If yes to any of these questions please route to Laverna Peace, RN and Jennye Boroughs, Charity fundraiser.

## 2019-05-26 ENCOUNTER — Other Ambulatory Visit: Payer: Self-pay | Admitting: Gastroenterology

## 2019-05-26 ENCOUNTER — Other Ambulatory Visit (HOSPITAL_COMMUNITY): Admission: RE | Admit: 2019-05-26 | Payer: Medicaid Other | Source: Ambulatory Visit

## 2019-05-26 DIAGNOSIS — Z1159 Encounter for screening for other viral diseases: Secondary | ICD-10-CM | POA: Diagnosis not present

## 2019-05-26 LAB — SARS CORONAVIRUS 2 (TAT 6-24 HRS): SARS Coronavirus 2: NEGATIVE

## 2019-05-27 ENCOUNTER — Encounter (HOSPITAL_COMMUNITY): Payer: Self-pay | Admitting: Gastroenterology

## 2019-05-27 ENCOUNTER — Other Ambulatory Visit: Payer: Self-pay

## 2019-05-27 NOTE — Progress Notes (Signed)
Mr Vincent Clarke denies cheat pain or shortness of breath. Patient tested negative for Covid 05/25/2018. Patient  Has been in quarantine with his family since that time.  Mr Vincent Clarke takes Buprenorphine - Naloxon Film 2 times a day. I asked [name] is he is stopping Buprenorphine - Naloxon before Endoscopy, he said no, last time he took it early the morning of procedure. Mr Vincent Clarke used Heroin last early December r2020

## 2019-05-30 ENCOUNTER — Ambulatory Visit (HOSPITAL_COMMUNITY): Payer: Medicaid Other | Admitting: Anesthesiology

## 2019-05-30 ENCOUNTER — Ambulatory Visit (HOSPITAL_COMMUNITY): Payer: Medicaid Other

## 2019-05-30 ENCOUNTER — Ambulatory Visit (HOSPITAL_COMMUNITY)
Admission: RE | Admit: 2019-05-30 | Discharge: 2019-05-30 | Disposition: A | Discharge status: Home / Self Care | Payer: Medicaid Other | Attending: Gastroenterology | Admitting: Gastroenterology

## 2019-05-30 ENCOUNTER — Other Ambulatory Visit: Payer: Self-pay

## 2019-05-30 ENCOUNTER — Encounter (HOSPITAL_COMMUNITY)
Admission: RE | Disposition: A | Discharge status: Home / Self Care | Payer: Self-pay | Source: Home / Self Care | Attending: Gastroenterology

## 2019-05-30 ENCOUNTER — Encounter (HOSPITAL_COMMUNITY): Payer: Self-pay | Admitting: Gastroenterology

## 2019-05-30 DIAGNOSIS — I700 Atherosclerosis of aorta: Secondary | ICD-10-CM | POA: Diagnosis not present

## 2019-05-30 DIAGNOSIS — Z9049 Acquired absence of other specified parts of digestive tract: Secondary | ICD-10-CM | POA: Insufficient documentation

## 2019-05-30 DIAGNOSIS — I10 Essential (primary) hypertension: Secondary | ICD-10-CM | POA: Insufficient documentation

## 2019-05-30 DIAGNOSIS — Z885 Allergy status to narcotic agent status: Secondary | ICD-10-CM | POA: Diagnosis not present

## 2019-05-30 DIAGNOSIS — K228 Other specified diseases of esophagus: Secondary | ICD-10-CM | POA: Diagnosis not present

## 2019-05-30 DIAGNOSIS — M1990 Unspecified osteoarthritis, unspecified site: Secondary | ICD-10-CM | POA: Insufficient documentation

## 2019-05-30 DIAGNOSIS — F17210 Nicotine dependence, cigarettes, uncomplicated: Secondary | ICD-10-CM | POA: Diagnosis not present

## 2019-05-30 DIAGNOSIS — N400 Enlarged prostate without lower urinary tract symptoms: Secondary | ICD-10-CM | POA: Insufficient documentation

## 2019-05-30 DIAGNOSIS — F419 Anxiety disorder, unspecified: Secondary | ICD-10-CM | POA: Insufficient documentation

## 2019-05-30 DIAGNOSIS — Z931 Gastrostomy status: Secondary | ICD-10-CM | POA: Insufficient documentation

## 2019-05-30 DIAGNOSIS — Z8719 Personal history of other diseases of the digestive system: Secondary | ICD-10-CM | POA: Insufficient documentation

## 2019-05-30 DIAGNOSIS — Z79899 Other long term (current) drug therapy: Secondary | ICD-10-CM | POA: Insufficient documentation

## 2019-05-30 DIAGNOSIS — K222 Esophageal obstruction: Secondary | ICD-10-CM | POA: Diagnosis not present

## 2019-05-30 DIAGNOSIS — I2510 Atherosclerotic heart disease of native coronary artery without angina pectoris: Secondary | ICD-10-CM | POA: Diagnosis not present

## 2019-05-30 DIAGNOSIS — K2100 Gastro-esophageal reflux disease with esophagitis, without bleeding: Secondary | ICD-10-CM | POA: Insufficient documentation

## 2019-05-30 DIAGNOSIS — K449 Diaphragmatic hernia without obstruction or gangrene: Secondary | ICD-10-CM | POA: Diagnosis not present

## 2019-05-30 DIAGNOSIS — I739 Peripheral vascular disease, unspecified: Secondary | ICD-10-CM | POA: Diagnosis not present

## 2019-05-30 DIAGNOSIS — J45909 Unspecified asthma, uncomplicated: Secondary | ICD-10-CM | POA: Diagnosis not present

## 2019-05-30 DIAGNOSIS — R1310 Dysphagia, unspecified: Secondary | ICD-10-CM | POA: Insufficient documentation

## 2019-05-30 DIAGNOSIS — F1120 Opioid dependence, uncomplicated: Secondary | ICD-10-CM | POA: Insufficient documentation

## 2019-05-30 DIAGNOSIS — Z888 Allergy status to other drugs, medicaments and biological substances status: Secondary | ICD-10-CM | POA: Diagnosis not present

## 2019-05-30 HISTORY — PX: ESOPHAGOGASTRODUODENOSCOPY (EGD) WITH PROPOFOL: SHX5813

## 2019-05-30 HISTORY — PX: ESOPHAGEAL STENT PLACEMENT: SHX5540

## 2019-05-30 SURGERY — ESOPHAGOGASTRODUODENOSCOPY (EGD) WITH PROPOFOL
Anesthesia: Monitor Anesthesia Care

## 2019-05-30 MED ORDER — LIDOCAINE 2% (20 MG/ML) 5 ML SYRINGE
INTRAMUSCULAR | Status: DC | PRN
  Administered 2019-05-30: 60 mg via INTRAVENOUS

## 2019-05-30 MED ORDER — SUCCINYLCHOLINE CHLORIDE 200 MG/10ML IV SOSY
PREFILLED_SYRINGE | INTRAVENOUS | Status: DC | PRN
  Administered 2019-05-30: 100 mg via INTRAVENOUS

## 2019-05-30 MED ORDER — ONDANSETRON HCL 4 MG/2ML IJ SOLN
INTRAMUSCULAR | Status: DC | PRN
  Administered 2019-05-30: 4 mg via INTRAVENOUS

## 2019-05-30 MED ORDER — LACTATED RINGERS IV SOLN
INTRAVENOUS | Status: AC | PRN
  Administered 2019-05-30: 1000 mL via INTRAVENOUS

## 2019-05-30 MED ORDER — ROCURONIUM BROMIDE 100 MG/10ML IV SOLN
INTRAVENOUS | Status: DC | PRN
  Administered 2019-05-30: 30 mg via INTRAVENOUS

## 2019-05-30 MED ORDER — FENTANYL CITRATE (PF) 100 MCG/2ML IJ SOLN
INTRAMUSCULAR | Status: AC
  Filled 2019-05-30: qty 2

## 2019-05-30 MED ORDER — PROPOFOL 10 MG/ML IV BOLUS
INTRAVENOUS | Status: DC | PRN
  Administered 2019-05-30 (×2): 100 mg via INTRAVENOUS
  Administered 2019-05-30: 50 mg via INTRAVENOUS

## 2019-05-30 MED ORDER — SODIUM CHLORIDE 0.9 % IV SOLN: INTRAVENOUS | Status: DC

## 2019-05-30 MED ORDER — DEXMEDETOMIDINE HCL 200 MCG/2ML IV SOLN
INTRAVENOUS | Status: DC | PRN
  Administered 2019-05-30 (×2): 4 ug via INTRAVENOUS
  Administered 2019-05-30: 8 ug via INTRAVENOUS

## 2019-05-30 MED ORDER — PHENYLEPHRINE 40 MCG/ML (10ML) SYRINGE FOR IV PUSH (FOR BLOOD PRESSURE SUPPORT)
PREFILLED_SYRINGE | INTRAVENOUS | Status: DC | PRN
  Administered 2019-05-30: 80 ug via INTRAVENOUS

## 2019-05-30 MED ORDER — DEXAMETHASONE SODIUM PHOSPHATE 10 MG/ML IJ SOLN
INTRAMUSCULAR | Status: DC | PRN
  Administered 2019-05-30: 5 mg via INTRAVENOUS

## 2019-05-30 MED ORDER — ONDANSETRON HCL 4 MG/2ML IJ SOLN
4.0000 mg | Freq: Once | INTRAMUSCULAR | Status: AC
  Administered 2019-05-30: 4 mg via INTRAVENOUS

## 2019-05-30 MED ORDER — ONDANSETRON HCL 4 MG/2ML IJ SOLN
INTRAMUSCULAR | Status: AC
  Filled 2019-05-30: qty 2

## 2019-05-30 MED ORDER — SUGAMMADEX SODIUM 200 MG/2ML IV SOLN
INTRAVENOUS | Status: DC | PRN
  Administered 2019-05-30: 147.8 mg via INTRAVENOUS

## 2019-05-30 MED FILL — GI COCKTAIL (LID,DICYC,MAA): 5 days supply | Qty: 550 | Fill #0

## 2019-05-30 SURGICAL SUPPLY — 14 items

## 2019-05-30 NOTE — Anesthesia Preprocedure Evaluation (Addendum)
Anesthesia Evaluation  Patient identified by MRN, date of birth, ID band Patient awake    Reviewed: Allergy & Precautions, NPO status , Patient's Chart, lab work & pertinent test results  History of Anesthesia Complications Negative for: history of anesthetic complications  Airway Mallampati: II  TM Distance: >3 FB Neck ROM: Full    Dental  (+) Edentulous Upper, Edentulous Lower   Pulmonary asthma , Current Smoker and Patient abstained from smoking.,    Pulmonary exam normal        Cardiovascular hypertension, Pt. on medications + Peripheral Vascular Disease  Normal cardiovascular exam     Neuro/Psych PSYCHIATRIC DISORDERS Anxiety negative neurological ROS     GI/Hepatic PUD, GERD  Medicated and Controlled,(+)     substance abuse  IV drug use,  Esophageal stricture Hx PEG tube    Endo/Other  negative endocrine ROS  Renal/GU negative Renal ROS     Musculoskeletal  (+) Arthritis , narcotic dependent  Abdominal   Peds  Hematology negative hematology ROS (+)   Anesthesia Other Findings Covid neg 1/14   Reproductive/Obstetrics                            Anesthesia Physical Anesthesia Plan  ASA: III  Anesthesia Plan: MAC   Post-op Pain Management:    Induction: Intravenous  PONV Risk Score and Plan: 1 and Propofol infusion and Treatment may vary due to age or medical condition  Airway Management Planned: Nasal Cannula and Natural Airway  Additional Equipment: None  Intra-op Plan:   Post-operative Plan:   Informed Consent: I have reviewed the patients History and Physical, chart, labs and discussed the procedure including the risks, benefits and alternatives for the proposed anesthesia with the patient or authorized representative who has indicated his/her understanding and acceptance.       Plan Discussed with: CRNA and Anesthesiologist  Anesthesia Plan Comments:         Anesthesia Quick Evaluation

## 2019-05-30 NOTE — Discharge Instructions (Signed)
YOU HAD AN ENDOSCOPIC PROCEDURE TODAY: Refer to the procedure report and other information in the discharge instructions given to you for any specific questions about what was found during the examination. If this information does not answer your questions, please call [place] office at [phone] to clarify.   YOU SHOULD EXPECT: Some feelings of bloating in the abdomen. Passage of more gas than usual. Walking can help get rid of the air that was put into your GI tract during the procedure and reduce the bloating. If you had a lower endoscopy (such as a colonoscopy or flexible sigmoidoscopy) you may notice spotting of blood in your stool or on the toilet paper. Some abdominal soreness may be present for a day or two, also.  DIET: Your first meal following the procedure should be a light meal and then it is ok to progress to your normal diet. A half-sandwich or bowl of soup is an example of a good first meal. Heavy or fried foods are harder to digest and may make you feel nauseous or bloated. Drink plenty of fluids but you should avoid alcoholic beverages for 24 hours. If you had a esophageal dilation, please see attached instructions for diet.    ACTIVITY: Your care partner should take you home directly after the procedure. You should plan to take it easy, moving slowly for the rest of the day. You can resume normal activity the day after the procedure however YOU SHOULD NOT DRIVE, use power tools, machinery or perform tasks that involve climbing or major physical exertion for 24 hours (because of the sedation medicines used during the test).   SYMPTOMS TO REPORT IMMEDIATELY: A gastroenterologist can be reached at any hour. Please call [phone]  for any of the following symptoms:   Following upper endoscopy (EGD, EUS, ERCP, esophageal dilation) Vomiting of blood or coffee ground material  New, significant abdominal pain  New, significant chest pain or pain under the shoulder blades  Painful or  persistently difficult swallowing  New shortness of breath  Black, tarry-looking or red, bloody stools  FOLLOW UP:  If any biopsies were taken you will be contacted by phone or by letter within the next 1-3 weeks. Call [phone]  if you have not heard about the biopsies in 3 weeks.  Please also call with any specific questions about appointments or follow up tests.  

## 2019-05-30 NOTE — Interval H&P Note (Signed)
History and Physical Interval Note:  05/30/2019 11:48 AM  Vincent Clarke  has presented today for surgery, with the diagnosis of Esophageal Stricture.  The various methods of treatment have been discussed with the patient and family. After consideration of risks, benefits and other options for treatment, the patient has consented to  Procedure(s): ESOPHAGOGASTRODUODENOSCOPY (EGD) WITH PROPOFOL (N/A) SAVORY DILATION (N/A) as a surgical intervention.  The patient's history has been reviewed, patient examined, no change in status, stable for surgery.  I have reviewed the patient's chart and labs.  Questions were answered to the patient's satisfaction.  Possible Endoscopic Stenting.     [employer]

## 2019-05-30 NOTE — Anesthesia Procedure Notes (Signed)
Procedure Name: Intubation Date/Time: 05/30/2019 1:30 PM Performed by: Kathryne Hitch, CRNA Pre-anesthesia Checklist: Patient identified, Emergency Drugs available, Suction available and Patient being monitored Patient Re-evaluated:Patient Re-evaluated prior to induction Oxygen Delivery Method: Circle system utilized Preoxygenation: Pre-oxygenation with 100% oxygen Induction Type: IV induction Ventilation: Mask ventilation without difficulty Laryngoscope Size: Mac and 4 Grade View: Grade I Tube type: Oral Tube size: 7.5 mm Number of attempts: 1 Airway Equipment and Method: Stylet and Oral airway Placement Confirmation: ETT inserted through vocal cords under direct vision,  positive ETCO2 and breath sounds checked- equal and bilateral Secured at: 23 cm Tube secured with: Tape Dental Injury: Teeth and Oropharynx as per pre-operative assessment

## 2019-05-30 NOTE — Interval H&P Note (Signed)
History and Physical Interval Note:  05/30/2019 12:57 PM  Vincent Clarke  has presented today for surgery, with the diagnosis of Esophageal Stricture.  The various methods of treatment have been discussed with the patient and family. After consideration of risks, benefits and other options for treatment, the patient has consented to  Procedure(s): ESOPHAGOGASTRODUODENOSCOPY (EGD) WITH PROPOFOL (N/A) SAVORY DILATION (N/A) as a surgical intervention.  The patient's history has been reviewed, patient examined, no change in status, stable for surgery.  I have reviewed the patient's chart and labs.  Questions were answered to the patient's satisfaction.    Possible enteral stenting.  [employer]

## 2019-05-30 NOTE — Transfer of Care (Signed)
Immediate Anesthesia Transfer of Care Note  Patient: Vincent Clarke  Procedure(s) Performed: ESOPHAGOGASTRODUODENOSCOPY (EGD) WITH PROPOFOL (N/A ) ESOPHAGEAL STENT PLACEMENT (N/A )  Patient Location: Endoscopy Unit  Anesthesia Type:General  Level of Consciousness: awake, alert  and oriented  Airway & Oxygen Therapy: Patient Spontanous Breathing and Patient connected to face mask oxygen  Post-op Assessment: Report given to RN, Post -op Vital signs reviewed and stable and Patient moving all extremities  Post vital signs: Reviewed and stable  Last Vitals:  Vitals Value Taken Time  BP 124/58 05/30/19 1430  Temp 36.5 C 05/30/19 1430  Pulse 67 05/30/19 1437  Resp 25 05/30/19 1437  SpO2 96 % 05/30/19 1437  Vitals shown include unvalidated device data.  Last Pain:  Vitals:   05/30/19 1430  TempSrc: Temporal  PainSc:          Complications: No apparent anesthesia complications

## 2019-05-30 NOTE — Op Note (Signed)
[employer] Patient Name: Vincent Clarke Procedure Date : 05/30/2019 MRN: [id number] Attending MD: Justice Britain , MD Date of Birth: 1965/11/04 CSN: [id number] Age: 54 Admit Type: Outpatient Procedure:                Upper GI endoscopy Indications:              Dysphagia, Stenosis of the esophagus, For therapy                            of esophageal stenosis Providers:                Justice Britain, MD, Vista Lawman, RN, Elspeth Cho Tech., Technician, Haze Boyden, CRNA Referring MD:             Carlota Raspberry. Havery [name], MD Medicines:                General Anesthesia Complications:            No immediate complications. Estimated Blood Loss:     Estimated blood loss was minimal. Procedure:                Pre-Anesthesia Assessment:                           - Prior to the procedure, a History and Physical                            was performed, and patient medications and                            allergies were reviewed. The patient's tolerance of                            previous anesthesia was also reviewed. The risks                            and benefits of the procedure and the sedation                            options and risks were discussed with the patient.                            All questions were answered, and informed consent                            was obtained. Prior Anticoagulants: The patient has                            taken no previous anticoagulant or antiplatelet                            agents. ASA Grade Assessment: III - A patient with  severe systemic disease. After reviewing the risks                            and benefits, the patient was deemed in                            satisfactory condition to undergo the procedure.                           After obtaining informed consent, the endoscope was                            passed under direct vision. Throughout  the                            procedure, the patient's blood pressure, pulse, and                            oxygen saturations were monitored continuously. The                            GIF-H190 ([id number]) Olympus gastroscope was                            introduced through the mouth, and advanced to the                            second part of duodenum. The upper GI endoscopy was                            accomplished without difficulty. The patient                            tolerated the procedure. Scope In: Scope Out: Findings:      No gross lesions were noted in the proximal esophagus.      One benign-appearing, intrinsic moderate (circumferential scarring or       stenosis; an endoscope may pass) stenosis was found 34 to 37 cm from the       incisors. This stenosis measured 1 cm (inner diameter) x 3 cm (in       length). The stenosis was traversed with the adult endoscope. There was       evidence of a diverticulum v pseudodiverticulum just proximal to the       stenosis (most likely related to previous dilations). After the       completion of the rest of the EGD, placement of a long 0.035 inch Soft       Antonietta Breach was attempted. This passed successfully. This stenosis/stricture       was stented with an 18 mm x 10.3 cm WallFlex covered stent under       fluoroscopic guidance.      The Z-line was irregular and was found 35 cm from the incisors.      Circumferential salmon-colored mucosa was present from 35 to 39 cm. No       other visible abnormalities were present.  A 3 cm hiatal hernia was present.      There was evidence of an intact gastrostomy with a patent G-tube present       in the gastric body.      No other gross lesions were noted in the entire examined stomach.      No gross lesions were noted in the duodenal bulb, in the first portion       of the duodenum and in the second portion of the duodenum. Impression:               - No gross lesions in esophagus.                            - Benign-appearing esophageal stenosis. Prosthesis                            placed.                           - Z-line irregular, 35 cm from the incisors.                           - Salmon-colored mucosa distally.                           - 3 cm hiatal hernia.                           - Intact gastrostomy with a patent G-tube present.                           - No gross lesions in the duodenal bulb, in the                            first portion of the duodenum and in the second                            portion of the duodenum. Recommendation:           - The patient will be observed post-procedure,                            until all discharge criteria are met.                           - Discharge patient to home.                           - Patient has a contact number available for                            emergencies. The signs and symptoms of potential                            delayed complications were discussed with the  patient. Return to normal activities tomorrow.                            Written discharge instructions were provided to the                            patient.                           - Diet clear liquids x 24 hours. Then soft                            purreed/smooth foods thereafter. If solid food is                            to be attempted, would be chewed completely, but we                            do not want to increase risk of stent migration or                            bolus entrapment.                           - Continue present medications (PPI BID until stent                            is removed.                           - If you have progressive Chest pain or worsening                            heartburn symptoms or other issues please call                            office, as we will likely need to plan a                            CXR/Abdominal X-Ray to ensure stent has  not                            migrated. We will arrange for GI Cocktails to be                            sent to pharmacy to use as needed.                           - Repeat upper endoscopy in 1 month for stent                            removal. Could consider stent upsizing, but can see  how things look.                           - The findings and recommendations were discussed                            with the patient.                           - The findings and recommendations were discussed                            with the patient's family. Procedure Code(s):        --- Professional ---                           [id number], Esophagogastroduodenoscopy, flexible,                            transoral; with placement of endoscopic stent                            (includes pre- and post-dilation and guide wire                            passage, when performed) Diagnosis Code(s):        --- Professional ---                           K22.8, Other specified diseases of esophagus                           K22.2, Esophageal obstruction                           K44.9, Diaphragmatic hernia without obstruction or                            gangrene                           Z93.1, Gastrostomy status                           R13.10, Dysphagia, unspecified CPT copyright 2019 American Medical Association. All rights reserved. The codes documented in this report are preliminary and upon coder review may  be revised to meet current compliance requirements. Justice Britain, MD 05/30/2019 2:36:04 PM Number of Addenda: 0

## 2019-05-31 NOTE — Anesthesia Postprocedure Evaluation (Signed)
Anesthesia Post Note  Patient: Vincent Clarke  Procedure(s) Performed: ESOPHAGOGASTRODUODENOSCOPY (EGD) WITH PROPOFOL (N/A ) ESOPHAGEAL STENT PLACEMENT (N/A )     Patient location during evaluation: PACU Anesthesia Type: General Level of consciousness: awake and alert Pain management: pain level controlled Vital Signs Assessment: post-procedure vital signs reviewed and stable Respiratory status: spontaneous breathing, nonlabored ventilation, respiratory function stable and patient connected to nasal cannula oxygen Cardiovascular status: stable and blood pressure returned to baseline Postop Assessment: no apparent nausea or vomiting Anesthetic complications: no    Last Vitals:  Vitals:   05/30/19 1510 05/30/19 1524  BP: (!) 151/88 (!) 160/97  Pulse: 73 71  Resp: 16 16  Temp:    SpO2: 99% 98%    Last Pain:  Vitals:   05/30/19 1524  TempSrc:   PainSc: 0-No pain                 Kennieth Rad

## 2019-06-01 ENCOUNTER — Telehealth: Payer: Self-pay

## 2019-06-01 NOTE — Telephone Encounter (Signed)
The pt has been scheduled to see Dr Meridee Score on 06/30/19 at 1130 am.  The pt has been advised and will keep appt as scheduled.

## 2019-06-01 NOTE — Telephone Encounter (Signed)
-----   Message from Lemar Lofty., MD sent at 05/31/2019  5:11 PM EST ----- Reita Cliche sure we can get him in. Iain Sawchuk, set up patient for a follow up with me in 2-3 weeks. His EGD with stent pull should be 4-weeks from now. Thanks. GM ----- Message ----- From: Benancio Deeds, MD Sent: 05/31/2019  12:19 PM EST To: Lemar Lofty., MD  Thanks very much Gabe! If you have the room to see him and want to follow him up for the stent, that is fine. If you are booked out and have no room I can make room to see him, whichever you prefer. Thanks for letting me know.  Brett Canales ----- Message ----- From: Lemar Lofty., MD Sent: 05/31/2019   9:34 AM EST To: Benancio Deeds, MD  Brett Canales, Able to get a stent in place. I hope it doesn't migrate, we will see. I plan to remove it in 4-weeks. Would you like to see him in clinic between to see how he is doing or do you want me to see in 2-3 weeks? Avina Eberle, for now, please place get patient on the list for a 49-month EGD with stent pull. Thanks. GM

## 2019-06-01 NOTE — Telephone Encounter (Signed)
Mansouraty, Netty Starring., MD  Armbruster, Willaim Rayas, MD; Loretha Stapler, RN  Brett Canales,  I'm sure we can get him in.  Casimir Barcellos, set up patient for a follow up with me in 2-3 weeks.  His EGD with stent pull should be 4-weeks from now.  Thanks.  GM

## 2019-06-03 DIAGNOSIS — K222 Esophageal obstruction: Secondary | ICD-10-CM | POA: Diagnosis not present

## 2019-06-03 DIAGNOSIS — R1310 Dysphagia, unspecified: Secondary | ICD-10-CM | POA: Diagnosis not present

## 2019-06-13 DIAGNOSIS — R1310 Dysphagia, unspecified: Secondary | ICD-10-CM | POA: Diagnosis not present

## 2019-06-13 DIAGNOSIS — K222 Esophageal obstruction: Secondary | ICD-10-CM | POA: Diagnosis not present

## 2019-06-13 MED FILL — LISINOPRIL 20 MG TABLET: 20 | 30 days supply | Qty: 30 | Fill #2

## 2019-06-13 MED FILL — PANTOPRAZOLE SOD DR 40 MG T: 40 | 30 days supply | Qty: 120 | Fill #0

## 2019-06-13 MED FILL — traZODone HCL 50 MG TABS: 50 | 30 days supply | Qty: 30 | Fill #1

## 2019-06-14 ENCOUNTER — Encounter: Payer: Self-pay | Admitting: Internal Medicine

## 2019-06-14 ENCOUNTER — Other Ambulatory Visit: Payer: Self-pay

## 2019-06-14 ENCOUNTER — Ambulatory Visit (INDEPENDENT_AMBULATORY_CARE_PROVIDER_SITE_OTHER): Payer: Medicaid Other | Admitting: Internal Medicine

## 2019-06-14 DIAGNOSIS — K222 Esophageal obstruction: Secondary | ICD-10-CM

## 2019-06-14 DIAGNOSIS — F411 Generalized anxiety disorder: Secondary | ICD-10-CM | POA: Diagnosis not present

## 2019-06-14 DIAGNOSIS — F1121 Opioid dependence, in remission: Secondary | ICD-10-CM

## 2019-06-14 DIAGNOSIS — F5105 Insomnia due to other mental disorder: Secondary | ICD-10-CM | POA: Diagnosis not present

## 2019-06-14 DIAGNOSIS — F419 Anxiety disorder, unspecified: Secondary | ICD-10-CM

## 2019-06-14 DIAGNOSIS — F1120 Opioid dependence, uncomplicated: Secondary | ICD-10-CM | POA: Diagnosis not present

## 2019-06-14 MED ORDER — BUPRENORPHINE HCL-NALOXONE HCL 8-2 MG SL FILM: 1.0000 | ORAL_FILM | Freq: Two times a day (BID) | SUBLINGUAL | 0 refills | Status: DC

## 2019-06-14 NOTE — Progress Notes (Signed)
    This is a telephone encounter between [employer] and [employer] on 06/14/2019 for OUD follow up. The visit was conducted with the patient located at home and Reymundo Poll at [facility]. The patient's identity was confirmed using their DOB and current address. The patient has consented to being evaluated through a telephone encounter and understands the associated risks (an examination cannot be done and the patient may need to come in for an appointment) / benefits (allows the patient to remain at home, decreasing exposure to coronavirus). I personally spent 10 minutes on medical discussion.   CC: Mr. Freilich presents for follow up of opioid use disorder. I have reviewed the prior induction visit, follow up visits, and telephone encounters relevant to opiate use disorder (OUD) treatment.   Current daily dose: Suboxone 8 mg BID  Date of Induction: 05/14/18  Current follow up interval, in weeks: 4 weeks  The patient has been adherent with the buprenorphine for OUD contract.   Last UDS Result: 04/19/19 - appropriate for buprenorphine and metabolite. Inappropriate for morphine.   HPI: For the details of today's visit, please refer to the assessment and plan.    Assessment & Plan:   See Encounters Tab for problem based charting.

## 2019-06-14 NOTE — Assessment & Plan Note (Addendum)
Stable. Doing well on Suboxone 8 mg BID. Denies any relapse. He recently underwent another procedure for his esophageal stricture. He has a sleeve in place and reports his pain is much improved and he is able to eat by mouth. He has another procedure planned in 2 weeks. I reviewed the database, refill history is appropriate. Last UDS was appropriate for buprenorphine and metabolite but inappropriate for morphine. Sent 1 month refill to his pharmacy. Plan to follow up in person in 4 weeks for repeat UDS.

## 2019-06-14 NOTE — Assessment & Plan Note (Signed)
Patient was seen by me in December and started on citalopram for GAD and trazodone for insomnia. He reports side effect of sexual dysfunction with citalopram and has discontinued this medication. The trazodone has been working well for him though. He takes 50 mg QHS prn. Plan to continue this for now. Can readdress anxiety at follow up. Patient may benefit from counseling.

## 2019-06-15 MED FILL — SUBOXONE 8 MG-2 MG SL FILM: 8-2 | 30 days supply | Qty: 60 | Fill #0

## 2019-06-21 ENCOUNTER — Telehealth: Payer: Self-pay

## 2019-06-21 NOTE — Telephone Encounter (Signed)
Needs EGD 07/18/19 1130 am [facility] 1 hour fluor for stent removal Dr Meridee Score

## 2019-06-22 ENCOUNTER — Other Ambulatory Visit: Payer: Self-pay

## 2019-06-22 DIAGNOSIS — K222 Esophageal obstruction: Secondary | ICD-10-CM

## 2019-06-22 NOTE — Telephone Encounter (Signed)
The pt has been scheduled for EGD at [facility] at 1230 pm.  Dr Meridee Score called with the verbal order on 2/9.  The pt will keep his appt for 2/18 to discuss.

## 2019-06-29 ENCOUNTER — Telehealth: Payer: Self-pay | Admitting: Gastroenterology

## 2019-06-29 DIAGNOSIS — K222 Esophageal obstruction: Secondary | ICD-10-CM

## 2019-06-29 DIAGNOSIS — T85528A Displacement of other gastrointestinal prosthetic devices, implants and grafts, initial encounter: Secondary | ICD-10-CM

## 2019-06-29 NOTE — Telephone Encounter (Signed)
Thanks Gabe for keeping me in the loop.

## 2019-06-29 NOTE — Telephone Encounter (Signed)
Dr Meridee Score the pt had an appt for tomorrow that was cancelled to discuss the stent removal.  He was rescheduled to 3/30 for an appt in the office.  He has a procedure on 3/8.  Does he need to see you in the office prior to the appt on 3/30 ?

## 2019-06-29 NOTE — Telephone Encounter (Signed)
Vincent Clarke, Called patient back today. He had had midabdominal discomfort with associated nausea and vomiting starting this past Sunday. However, his swallowing is OK per his report. He had been doing well with the current stent in place. He is now being able to eat a bit today but is scared to push things significantly. I recommend that we obtain labs and imaging as follows: - CXR/KUB (2-view) -CBC/CMP/Lipase/Amylase If he can come today to the lab and radiology then that may help, if not and we can find a facility for him to go on Thursday since the building is closed now due to the potential inclement weather that would be ideal to do on Thursday but if need me he can come on Friday. If pain becomes so severe then he needs to go to the [facility] or [facility] ED for further evaluation and imaging. I will relay this to Dr. Adela Lank as well. Let me know what he thinks he can do. We will discuss the role of clinic follow up after the labs/imaging are performed. Thanks. GM

## 2019-06-29 NOTE — Telephone Encounter (Signed)
I called the pt and we discussed him trying to come to our office today for labs and an xray and he states that he can not get here in time.  He was offered to have the testing tomorrow at an alternate facility but declined due to upcoming inclement weather.  I will put the order in for the pt to come to our office on Friday morning for testing.  Dr Meridee Score notified.

## 2019-06-30 ENCOUNTER — Ambulatory Visit: Payer: Medicaid Other | Admitting: Gastroenterology

## 2019-07-05 ENCOUNTER — Telehealth: Payer: Self-pay

## 2019-07-05 NOTE — Telephone Encounter (Signed)
-----   Message from Lemar Lofty., MD sent at 07/05/2019  4:16 PM EST ----- Regarding: Follow-up Vincent Clarke,Patient never came in for his labs or imaging.  My hope is that he is doing better.  If he is then great and he can just come in for the date of his procedure for [place] to remove his esophageal stent.  If he is still having issues needs to come in and get labs and imaging as we previously discussed.Thanks.Vincent Clarke

## 2019-07-05 NOTE — Telephone Encounter (Signed)
The pt states his sister was in a car accident and just got back in town today.  He will be here tomorrow for labs and xray

## 2019-07-06 ENCOUNTER — Ambulatory Visit (INDEPENDENT_AMBULATORY_CARE_PROVIDER_SITE_OTHER)
Admission: RE | Admit: 2019-07-06 | Discharge: 2019-07-06 | Disposition: A | Discharge status: Home / Self Care | Payer: Medicaid Other | Source: Ambulatory Visit | Attending: Gastroenterology | Admitting: Gastroenterology

## 2019-07-06 ENCOUNTER — Telehealth: Payer: Self-pay

## 2019-07-06 ENCOUNTER — Inpatient Hospital Stay (HOSPITAL_COMMUNITY)
Admission: EM | Admit: 2019-07-06 | Discharge: 2019-07-13 | DRG: 908 | Disposition: A | Discharge status: Home / Self Care | Payer: Medicaid Other | Source: Ambulatory Visit | Attending: Internal Medicine | Admitting: Internal Medicine

## 2019-07-06 ENCOUNTER — Other Ambulatory Visit (INDEPENDENT_AMBULATORY_CARE_PROVIDER_SITE_OTHER): Payer: Medicaid Other

## 2019-07-06 ENCOUNTER — Encounter (HOSPITAL_COMMUNITY): Payer: Self-pay

## 2019-07-06 ENCOUNTER — Emergency Department (HOSPITAL_COMMUNITY): Payer: Medicaid Other

## 2019-07-06 ENCOUNTER — Other Ambulatory Visit: Payer: Self-pay

## 2019-07-06 ENCOUNTER — Telehealth: Payer: Self-pay | Admitting: Gastroenterology

## 2019-07-06 DIAGNOSIS — F17210 Nicotine dependence, cigarettes, uncomplicated: Secondary | ICD-10-CM | POA: Diagnosis present

## 2019-07-06 DIAGNOSIS — Z9049 Acquired absence of other specified parts of digestive tract: Secondary | ICD-10-CM | POA: Diagnosis not present

## 2019-07-06 DIAGNOSIS — K529 Noninfective gastroenteritis and colitis, unspecified: Secondary | ICD-10-CM | POA: Diagnosis present

## 2019-07-06 DIAGNOSIS — Z955 Presence of coronary angioplasty implant and graft: Secondary | ICD-10-CM | POA: Diagnosis not present

## 2019-07-06 DIAGNOSIS — K56699 Other intestinal obstruction unspecified as to partial versus complete obstruction: Secondary | ICD-10-CM | POA: Diagnosis not present

## 2019-07-06 DIAGNOSIS — E46 Unspecified protein-calorie malnutrition: Secondary | ICD-10-CM | POA: Diagnosis present

## 2019-07-06 DIAGNOSIS — F5105 Insomnia due to other mental disorder: Secondary | ICD-10-CM | POA: Diagnosis present

## 2019-07-06 DIAGNOSIS — K226 Gastro-esophageal laceration-hemorrhage syndrome: Secondary | ICD-10-CM

## 2019-07-06 DIAGNOSIS — Z885 Allergy status to narcotic agent status: Secondary | ICD-10-CM | POA: Diagnosis not present

## 2019-07-06 DIAGNOSIS — I10 Essential (primary) hypertension: Secondary | ICD-10-CM | POA: Diagnosis present

## 2019-07-06 DIAGNOSIS — F419 Anxiety disorder, unspecified: Secondary | ICD-10-CM | POA: Diagnosis present

## 2019-07-06 DIAGNOSIS — K222 Esophageal obstruction: Secondary | ICD-10-CM

## 2019-07-06 DIAGNOSIS — K279 Peptic ulcer, site unspecified, unspecified as acute or chronic, without hemorrhage or perforation: Secondary | ICD-10-CM | POA: Diagnosis not present

## 2019-07-06 DIAGNOSIS — Z79899 Other long term (current) drug therapy: Secondary | ICD-10-CM

## 2019-07-06 DIAGNOSIS — K2210 Ulcer of esophagus without bleeding: Secondary | ICD-10-CM | POA: Diagnosis not present

## 2019-07-06 DIAGNOSIS — K219 Gastro-esophageal reflux disease without esophagitis: Secondary | ICD-10-CM | POA: Diagnosis present

## 2019-07-06 DIAGNOSIS — Z825 Family history of asthma and other chronic lower respiratory diseases: Secondary | ICD-10-CM

## 2019-07-06 DIAGNOSIS — E1151 Type 2 diabetes mellitus with diabetic peripheral angiopathy without gangrene: Secondary | ICD-10-CM | POA: Diagnosis present

## 2019-07-06 DIAGNOSIS — K2270 Barrett's esophagus without dysplasia: Secondary | ICD-10-CM | POA: Diagnosis present

## 2019-07-06 DIAGNOSIS — Y838 Other surgical procedures as the cause of abnormal reaction of the patient, or of later complication, without mention of misadventure at the time of the procedure: Secondary | ICD-10-CM | POA: Diagnosis present

## 2019-07-06 DIAGNOSIS — E876 Hypokalemia: Secondary | ICD-10-CM | POA: Diagnosis present

## 2019-07-06 DIAGNOSIS — F1121 Opioid dependence, in remission: Secondary | ICD-10-CM | POA: Diagnosis not present

## 2019-07-06 DIAGNOSIS — K449 Diaphragmatic hernia without obstruction or gangrene: Secondary | ICD-10-CM

## 2019-07-06 DIAGNOSIS — Z888 Allergy status to other drugs, medicaments and biological substances status: Secondary | ICD-10-CM | POA: Diagnosis not present

## 2019-07-06 DIAGNOSIS — T183XXA Foreign body in small intestine, initial encounter: Secondary | ICD-10-CM | POA: Diagnosis present

## 2019-07-06 DIAGNOSIS — Z931 Gastrostomy status: Secondary | ICD-10-CM | POA: Diagnosis not present

## 2019-07-06 DIAGNOSIS — K630 Abscess of intestine: Secondary | ICD-10-CM | POA: Diagnosis not present

## 2019-07-06 DIAGNOSIS — R109 Unspecified abdominal pain: Secondary | ICD-10-CM | POA: Diagnosis present

## 2019-07-06 DIAGNOSIS — Z20822 Contact with and (suspected) exposure to covid-19: Secondary | ICD-10-CM | POA: Diagnosis present

## 2019-07-06 DIAGNOSIS — Z9689 Presence of other specified functional implants: Secondary | ICD-10-CM | POA: Diagnosis not present

## 2019-07-06 DIAGNOSIS — K2100 Gastro-esophageal reflux disease with esophagitis, without bleeding: Secondary | ICD-10-CM | POA: Diagnosis present

## 2019-07-06 DIAGNOSIS — I700 Atherosclerosis of aorta: Secondary | ICD-10-CM | POA: Diagnosis not present

## 2019-07-06 DIAGNOSIS — Z9889 Other specified postprocedural states: Secondary | ICD-10-CM

## 2019-07-06 DIAGNOSIS — K56609 Unspecified intestinal obstruction, unspecified as to partial versus complete obstruction: Secondary | ICD-10-CM | POA: Diagnosis present

## 2019-07-06 DIAGNOSIS — R1013 Epigastric pain: Secondary | ICD-10-CM | POA: Diagnosis not present

## 2019-07-06 DIAGNOSIS — T85528S Displacement of other gastrointestinal prosthetic devices, implants and grafts, sequela: Secondary | ICD-10-CM | POA: Diagnosis not present

## 2019-07-06 DIAGNOSIS — S3720XA Unspecified injury of bladder, initial encounter: Secondary | ICD-10-CM | POA: Diagnosis not present

## 2019-07-06 DIAGNOSIS — T85528A Displacement of other gastrointestinal prosthetic devices, implants and grafts, initial encounter: Secondary | ICD-10-CM

## 2019-07-06 DIAGNOSIS — F1910 Other psychoactive substance abuse, uncomplicated: Secondary | ICD-10-CM | POA: Diagnosis present

## 2019-07-06 DIAGNOSIS — F1520 Other stimulant dependence, uncomplicated: Secondary | ICD-10-CM | POA: Diagnosis present

## 2019-07-06 DIAGNOSIS — R1010 Upper abdominal pain, unspecified: Secondary | ICD-10-CM | POA: Diagnosis not present

## 2019-07-06 DIAGNOSIS — Z03818 Encounter for observation for suspected exposure to other biological agents ruled out: Secondary | ICD-10-CM | POA: Diagnosis not present

## 2019-07-06 DIAGNOSIS — F1120 Opioid dependence, uncomplicated: Secondary | ICD-10-CM | POA: Diagnosis present

## 2019-07-06 DIAGNOSIS — J45909 Unspecified asthma, uncomplicated: Secondary | ICD-10-CM | POA: Insufficient documentation

## 2019-07-06 DIAGNOSIS — Z6826 Body mass index (BMI) 26.0-26.9, adult: Secondary | ICD-10-CM

## 2019-07-06 DIAGNOSIS — I739 Peripheral vascular disease, unspecified: Secondary | ICD-10-CM | POA: Diagnosis present

## 2019-07-06 DIAGNOSIS — R1084 Generalized abdominal pain: Secondary | ICD-10-CM | POA: Diagnosis not present

## 2019-07-06 DIAGNOSIS — T85528D Displacement of other gastrointestinal prosthetic devices, implants and grafts, subsequent encounter: Secondary | ICD-10-CM | POA: Diagnosis not present

## 2019-07-06 HISTORY — DX: Gastro-esophageal laceration-hemorrhage syndrome: K22.6

## 2019-07-06 LAB — CBC WITH DIFFERENTIAL/PLATELET
Abs Immature Granulocytes: 0.06 10*3/uL (ref 0.00–0.07)
Basophils Absolute: 0.2 10*3/uL — ABNORMAL HIGH (ref 0.0–0.1)
Basophils Absolute: 0.1 10*3/uL (ref 0.0–0.1)
Basophils Relative: 1.2 % (ref 0.0–3.0)
Basophils Relative: 0 %
Eosinophils Absolute: 1.5 10*3/uL — ABNORMAL HIGH (ref 0.0–0.7)
Eosinophils Absolute: 1.2 10*3/uL — ABNORMAL HIGH (ref 0.0–0.5)
Eosinophils Relative: 8.3 % — ABNORMAL HIGH (ref 0.0–5.0)
Eosinophils Relative: 7 %
HCT: 39.7 % (ref 39.0–52.0)
HCT: 38.2 % — ABNORMAL LOW (ref 39.0–52.0)
Hemoglobin: 13.4 g/dL (ref 13.0–17.0)
Hemoglobin: 12.8 g/dL — ABNORMAL LOW (ref 13.0–17.0)
Immature Granulocytes: 0 %
Lymphocytes Relative: 15.4 % (ref 12.0–46.0)
Lymphocytes Relative: 16 %
Lymphs Abs: 2.8 10*3/uL (ref 0.7–4.0)
Lymphs Abs: 2.6 10*3/uL (ref 0.7–4.0)
MCH: 30.3 pg (ref 26.0–34.0)
MCHC: 33.8 g/dL (ref 30.0–36.0)
MCHC: 33.5 g/dL (ref 30.0–36.0)
MCV: 88 fl (ref 78.0–100.0)
MCV: 90.3 fL (ref 80.0–100.0)
Monocytes Absolute: 1.5 10*3/uL — ABNORMAL HIGH (ref 0.1–1.0)
Monocytes Absolute: 1.2 10*3/uL — ABNORMAL HIGH (ref 0.1–1.0)
Monocytes Relative: 8 % (ref 3.0–12.0)
Monocytes Relative: 8 %
Neutro Abs: 12.4 10*3/uL — ABNORMAL HIGH (ref 1.4–7.7)
Neutro Abs: 10.8 10*3/uL — ABNORMAL HIGH (ref 1.7–7.7)
Neutrophils Relative %: 67.1 % (ref 43.0–77.0)
Neutrophils Relative %: 69 %
Platelets: 452 10*3/uL — ABNORMAL HIGH (ref 150.0–400.0)
Platelets: 411 10*3/uL — ABNORMAL HIGH (ref 150–400)
RBC: 4.51 Mil/uL (ref 4.22–5.81)
RBC: 4.23 MIL/uL (ref 4.22–5.81)
RDW: 12.9 % (ref 11.5–15.5)
RDW: 12.6 % (ref 11.5–15.5)
WBC: 18.4 10*3/uL (ref 4.0–10.5)
WBC: 16 10*3/uL — ABNORMAL HIGH (ref 4.0–10.5)
nRBC: 0 % (ref 0.0–0.2)

## 2019-07-06 LAB — LACTIC ACID, PLASMA: Lactic Acid, Venous: 1.3 mmol/L (ref 0.5–1.9)

## 2019-07-06 LAB — COMPREHENSIVE METABOLIC PANEL
ALT: 8 U/L (ref 0–53)
ALT: 11 U/L (ref 0–44)
AST: 10 U/L (ref 0–37)
AST: 13 U/L — ABNORMAL LOW (ref 15–41)
Albumin: 3.7 g/dL (ref 3.5–5.2)
Albumin: 3.5 g/dL (ref 3.5–5.0)
Alkaline Phosphatase: 115 U/L (ref 39–117)
Alkaline Phosphatase: 107 U/L (ref 38–126)
Anion gap: 8 (ref 5–15)
BUN: 11 mg/dL (ref 6–23)
BUN: 12 mg/dL (ref 6–20)
CO2: 27 mEq/L (ref 19–32)
CO2: 24 mmol/L (ref 22–32)
Calcium: 9.4 mg/dL (ref 8.4–10.5)
Calcium: 8.7 mg/dL — ABNORMAL LOW (ref 8.9–10.3)
Chloride: 101 mEq/L (ref 96–112)
Chloride: 104 mmol/L (ref 98–111)
Creatinine, Ser: 0.8 mg/dL (ref 0.40–1.50)
Creatinine, Ser: 0.75 mg/dL (ref 0.61–1.24)
GFR calc Af Amer: >60 mL/min (ref >60)
GFR calc non Af Amer: >60 mL/min (ref >60)
GFR: 100.46 mL/min (ref >60.00)
Glucose, Bld: 106 mg/dL — ABNORMAL HIGH (ref 70–99)
Glucose, Bld: 107 mg/dL — ABNORMAL HIGH (ref 70–99)
Potassium: 4.5 mEq/L (ref 3.5–5.1)
Potassium: 4 mmol/L (ref 3.5–5.1)
Sodium: 135 mEq/L (ref 135–145)
Sodium: 136 mmol/L (ref 135–145)
Total Bilirubin: 0.4 mg/dL (ref 0.2–1.2)
Total Bilirubin: 0.5 mg/dL (ref 0.3–1.2)
Total Protein: 7.1 g/dL (ref 6.0–8.3)
Total Protein: 6.9 g/dL (ref 6.5–8.1)

## 2019-07-06 LAB — RAPID URINE DRUG SCREEN, HOSP PERFORMED
Amphetamines: POSITIVE — AB
Barbiturates: NOT DETECTED
Benzodiazepines: NOT DETECTED
Cocaine: NOT DETECTED
Opiates: POSITIVE — AB
Tetrahydrocannabinol: NOT DETECTED

## 2019-07-06 LAB — URINALYSIS, ROUTINE W REFLEX MICROSCOPIC
Bilirubin Urine: NEGATIVE
Glucose, UA: NEGATIVE mg/dL
Hgb urine dipstick: NEGATIVE
Ketones, ur: NEGATIVE mg/dL
Leukocytes,Ua: NEGATIVE
Nitrite: NEGATIVE
Protein, ur: NEGATIVE mg/dL
Specific Gravity, Urine: 1.045 — ABNORMAL HIGH (ref 1.005–1.030)
pH: 7 (ref 5.0–8.0)

## 2019-07-06 LAB — RESPIRATORY PANEL BY RT PCR (FLU A&B, COVID)
Influenza A by PCR: NEGATIVE
Influenza B by PCR: NEGATIVE
SARS Coronavirus 2 by RT PCR: NEGATIVE

## 2019-07-06 LAB — AMYLASE: Amylase: 25 U/L — ABNORMAL LOW (ref 27–131)

## 2019-07-06 LAB — LIPASE: Lipase: 11 U/L (ref 11.0–59.0)

## 2019-07-06 LAB — LIPASE, BLOOD: Lipase: 18 U/L (ref 11–51)

## 2019-07-06 MED ORDER — ACETAMINOPHEN 650 MG RE SUPP: 650.0000 mg | Freq: Four times a day (QID) | RECTAL | Status: DC | PRN

## 2019-07-06 MED ORDER — IOHEXOL 300 MG/ML  SOLN
100.0000 mL | Freq: Once | INTRAMUSCULAR | Status: AC | PRN
  Administered 2019-07-06: 100 mL via INTRAVENOUS

## 2019-07-06 MED ORDER — ONDANSETRON HCL 4 MG/2ML IJ SOLN
4.0000 mg | Freq: Once | INTRAMUSCULAR | Status: AC
  Administered 2019-07-06: 4 mg via INTRAVENOUS
  Filled 2019-07-06: qty 2

## 2019-07-06 MED ORDER — FAMOTIDINE IN NACL 20-0.9 MG/50ML-% IV SOLN
20.0000 mg | Freq: Two times a day (BID) | INTRAVENOUS | Status: DC
  Administered 2019-07-06 – 2019-07-12 (×13): 20 mg via INTRAVENOUS
  Filled 2019-07-06 (×14): qty 50

## 2019-07-06 MED ORDER — MAGIC MOUTHWASH
15.0000 mL | Freq: Four times a day (QID) | ORAL | Status: DC | PRN
  Filled 2019-07-06: qty 15

## 2019-07-06 MED ORDER — ONDANSETRON HCL 4 MG/2ML IJ SOLN: 4.0000 mg | Freq: Four times a day (QID) | INTRAMUSCULAR | Status: DC | PRN

## 2019-07-06 MED ORDER — PROCHLORPERAZINE EDISYLATE 10 MG/2ML IJ SOLN: 5.0000 mg | INTRAMUSCULAR | Status: DC | PRN

## 2019-07-06 MED ORDER — ACETAMINOPHEN 325 MG PO TABS
650.0000 mg | ORAL_TABLET | Freq: Four times a day (QID) | ORAL | Status: DC | PRN
  Administered 2019-07-07: 650 mg via ORAL
  Filled 2019-07-06: qty 2

## 2019-07-06 MED ORDER — LACTATED RINGERS IV BOLUS
1000.0000 mL | Freq: Once | INTRAVENOUS | Status: AC
  Administered 2019-07-06: 1000 mL via INTRAVENOUS

## 2019-07-06 MED ORDER — LIP MEDEX EX OINT
1.0000 "application " | TOPICAL_OINTMENT | Freq: Two times a day (BID) | CUTANEOUS | Status: DC
  Administered 2019-07-07 – 2019-07-13 (×13): 1 via TOPICAL
  Filled 2019-07-06 (×4): qty 7

## 2019-07-06 MED ORDER — METHOCARBAMOL 1000 MG/10ML IJ SOLN
1000.0000 mg | Freq: Four times a day (QID) | INTRAVENOUS | Status: DC | PRN
  Administered 2019-07-07 – 2019-07-12 (×7): 1000 mg via INTRAVENOUS
  Filled 2019-07-06 (×3): qty 10
  Filled 2019-07-06 (×2): qty 1000
  Filled 2019-07-06 (×4): qty 10

## 2019-07-06 MED ORDER — LACTATED RINGERS IV BOLUS: 1000.0000 mL | Freq: Three times a day (TID) | INTRAVENOUS | Status: AC | PRN

## 2019-07-06 MED ORDER — PIPERACILLIN-TAZOBACTAM 3.375 G IVPB 30 MIN
3.3750 g | Freq: Once | INTRAVENOUS | Status: AC
  Administered 2019-07-06: 3.375 g via INTRAVENOUS
  Filled 2019-07-06: qty 50

## 2019-07-06 MED ORDER — ENOXAPARIN SODIUM 40 MG/0.4ML ~~LOC~~ SOLN
40.0000 mg | Freq: Every day | SUBCUTANEOUS | Status: DC
  Administered 2019-07-07 – 2019-07-12 (×6): 40 mg via SUBCUTANEOUS
  Filled 2019-07-06 (×6): qty 0.4

## 2019-07-06 MED ORDER — ENALAPRILAT 1.25 MG/ML IV SOLN
0.6250 mg | Freq: Four times a day (QID) | INTRAVENOUS | Status: DC | PRN
  Filled 2019-07-06: qty 1

## 2019-07-06 MED ORDER — FENTANYL CITRATE (PF) 100 MCG/2ML IJ SOLN
50.0000 ug | Freq: Once | INTRAMUSCULAR | Status: AC
  Administered 2019-07-06: 50 ug via INTRAVENOUS
  Filled 2019-07-06: qty 2

## 2019-07-06 MED ORDER — SODIUM CHLORIDE 0.9 % IV SOLN
8.0000 mg | Freq: Four times a day (QID) | INTRAVENOUS | Status: DC | PRN
  Filled 2019-07-06: qty 4

## 2019-07-06 MED ORDER — SODIUM CHLORIDE (PF) 0.9 % IJ SOLN
INTRAMUSCULAR | Status: AC
  Filled 2019-07-06: qty 50

## 2019-07-06 MED ORDER — KETOROLAC TROMETHAMINE 30 MG/ML IJ SOLN
30.0000 mg | Freq: Four times a day (QID) | INTRAMUSCULAR | Status: DC | PRN
  Administered 2019-07-06 – 2019-07-09 (×6): 30 mg via INTRAVENOUS
  Filled 2019-07-06 (×7): qty 1

## 2019-07-06 MED ORDER — PANTOPRAZOLE SODIUM 40 MG IV SOLR
40.0000 mg | Freq: Every day | INTRAVENOUS | Status: DC
  Administered 2019-07-06 – 2019-07-12 (×7): 40 mg via INTRAVENOUS
  Filled 2019-07-06 (×7): qty 40

## 2019-07-06 MED ORDER — DIPHENHYDRAMINE HCL 50 MG/ML IJ SOLN
12.5000 mg | Freq: Four times a day (QID) | INTRAMUSCULAR | Status: DC | PRN
  Administered 2019-07-10: 25 mg via INTRAVENOUS
  Filled 2019-07-06: qty 1

## 2019-07-06 MED ORDER — BISACODYL 10 MG RE SUPP
10.0000 mg | Freq: Every day | RECTAL | Status: DC
  Administered 2019-07-07 – 2019-07-10 (×2): 10 mg via RECTAL
  Filled 2019-07-06 (×7): qty 1

## 2019-07-06 MED ORDER — METOPROLOL TARTRATE 5 MG/5ML IV SOLN: 5.0000 mg | Freq: Four times a day (QID) | INTRAVENOUS | Status: DC | PRN

## 2019-07-06 MED ORDER — PIPERACILLIN-TAZOBACTAM 3.375 G IVPB
3.3750 g | Freq: Three times a day (TID) | INTRAVENOUS | Status: AC
  Administered 2019-07-07 – 2019-07-12 (×19): 3.375 g via INTRAVENOUS
  Filled 2019-07-06 (×19): qty 50

## 2019-07-06 NOTE — ED Provider Notes (Signed)
Medical screening examination/treatment/procedure(s) were conducted as a shared visit with non-physician practitioner(s) and myself.  I personally evaluated the patient during the encounter.    54 year old male presents for abdominal pain due to possible obstruction from esophageal stent.  CT shows mechanical obstruction.  Surgery consulted who states they will follow along and request medicine admission   Lorre Nick, MD 07/06/19 2051

## 2019-07-06 NOTE — ED Notes (Signed)
Called lab to add on drug screen.

## 2019-07-06 NOTE — ED Notes (Signed)
PT knife taken by security and labbled and pt educated how to get knife back at discharge.

## 2019-07-06 NOTE — ED Notes (Signed)
Pt transported to CT ?

## 2019-07-06 NOTE — Progress Notes (Signed)
Pharmacy Antibiotic Note  Vincent Clarke is a 54 y.o. male admitted on 07/06/2019 with Intra-abdominal infection.  Pharmacy has been consulted for zosyn dosing.  Plan: Zosyn 3.375g IV Q8H infused over 4hrs.  Weight: 165 lb (74.8 kg)  Temp (24hrs), Avg:98 F (36.7 C), Min:98 F (36.7 C), Max:98 F (36.7 C)  Recent Labs  Lab 07/06/19 1346 07/06/19 1800 07/06/19 1940  WBC 18.4 Repeated and verified X2.* 16.0*  --   CREATININE 0.80 0.75  --   LATICACIDVEN  --   --  1.3    Estimated Creatinine Clearance: 95.3 mL/min (by C-G formula based on SCr of 0.75 mg/dL).    Allergies  Allergen Reactions  . Flexeril [Cyclobenzaprine] Swelling    Facial swelling  . Tramadol Swelling    Antimicrobials this admission: Zosyn 07/06/2019 >>   Dose adjustments this admission: -  Microbiology results: -  Thank you for allowing pharmacy to be a part of this patient's care.  Vincent Clarke 07/06/2019 11:19 PM

## 2019-07-06 NOTE — Telephone Encounter (Signed)
Received a critical lab call of WBC 18.4. Just FYI. It will appear in your inbox soon.

## 2019-07-06 NOTE — Telephone Encounter (Signed)
Separate Telephone note was made. Thanks. GM
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
## 2019-07-06 NOTE — ED Triage Notes (Addendum)
Pt arrives POV from PCP. Pt reports that he had abd pain since Sunday just below area of G-tube. Pt reports that he was seen at PCP and was told that his esophogeal sleeve had slipped into intestines. Pt denies vomiting. Pt reports last BM was yesterday.

## 2019-07-06 NOTE — Consult Note (Addendum)
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 mesenteric edema within the lower pelvis. No free fluid or free gas. Musculoskeletal: No acute or destructive bony lesions. Reconstructed images demonstrate no additional findings. IMPRESSION: 1. Dislodged esophageal stent, seen within the distal jejunum. There is resulting wall thickening, surrounding fat stranding, and small bowel obstruction. Surgical consultation recommended. 2. Unremarkable percutaneous gastrostomy tube. Electronically Signed   By: Sharlet SalinaMichael  Brown M.D.   On: 07/06/2019 19:23   DG Abd 2 Views  Result Date: 07/06/2019 CLINICAL DATA:  Migrated esophageal stent EXAM: ABDOMEN - 2 VIEW COMPARISON:  None. FINDINGS: Esophageal stent is in the pelvis, either in a loop of distal small bowel or in the sigmoid colon. There is no free air. There are loops of borderline dilated small bowel with multiple air-fluid levels. There are surgical clips in the left upper abdomen. There is an apparent gastrostomy catheter in the stomach region. IMPRESSION: Esophageal stent has migrated into the pelvis, either within a loop of distal small bowel or sigmoid colon. Borderline dilatation of several loops of small bowel with multiple air-fluid levels. Question a  degree of enteritis versus early bowel obstruction. A degree of ileus could present similarly. No evident free air. Gastrostomy catheter in stomach region. With respect to location of the stent, correlation with CT may be helpful to assess for small bowel versus large bowel location. These results were called by telephone at the time of interpretation on 07/06/2019 at 4:30 pm to [facility] , who verbally acknowledged these results. Electronically Signed   By: Bretta BangWilliam  Woodruff III M.D.   On: 07/06/2019 16:30    Medications / Allergies: per chart  Antibiotics: Anti-infectives (From admission, onward)   Start     Dose/Rate Route Frequency Ordered Stop   07/06/19 2015  piperacillin-tazobactam (ZOSYN) IVPB 3.375 g     3.375 g 100 mL/hr over 30 Minutes Intravenous  Once 07/06/19 2006          Note: Portions of this report may have been transcribed using voice recognition software. Every effort was made to ensure accuracy; however, inadvertent computerized transcription errors may be present.   Any transcriptional errors that result from this process are unintentional.    Ardeth SportsmanSteven C. Shallyn Constancio, MD, FACS, MASCRS Gastrointestinal and Minimally Invasive Surgery  [facility] [address] [place], [place] [id number] [phone] Main / Paging [phone] Fax     07/06/2019

## 2019-07-06 NOTE — Telephone Encounter (Signed)
As per prior telephone messages patient had not been feeling well last week. Plan had been for him last week to come and get KUB/CXR and labs. He did not come until today to get these done. I received a note of a leukocytosis of 19K and Thrombocytosis of >450K but otherwise electrolytes OK. He had completed his CXR and KUB but they were initially placed as non-STAT orders. I looked and them and was concerned for stent migration as well as developing SBO/Ileus. Discussed with Radiology and he may have developing SBO vs enteritis.  Stent could be in mid-small bowel or it could be in the colon. If in colon it may be reachable endoscopically. If in SB, likely will not be able to be reached endoscopically and will likely require Surgical intervention for removal.  I called and spoke with patient and wife. Patient is not vomiting, but having significant pain and discomfort. No fevers or chills. He is able to eat but it causes him pain.  I have instructed him to come into the ED for further evaluation and a CT with eventual observation/admission to decide how to approach removal of this migrated esophageal stent.  His swallowing is improved, so I believe it has helped his refractory stricture, but unfortunately, as I had spoken with him and his family about prior to procedure, it has migrated.  I have called the [facility] ED and let them know of his impending arrival.  I will update our Inpatient GI team who will be available for consultation tomorrow.  I have updated our OnCall MD should he be called with the results of his CT as to next steps, but I believe that it will be based on surgical evaluation and intervention on obstruction with NGT placement if necessary to decompress if it does show this.  Patient agrees to come in for further evaluation.   Corliss Parish, MD [facility] Office # [phone]

## 2019-07-07 DIAGNOSIS — K2210 Ulcer of esophagus without bleeding: Secondary | ICD-10-CM

## 2019-07-07 DIAGNOSIS — K449 Diaphragmatic hernia without obstruction or gangrene: Secondary | ICD-10-CM

## 2019-07-07 DIAGNOSIS — K529 Noninfective gastroenteritis and colitis, unspecified: Secondary | ICD-10-CM

## 2019-07-07 DIAGNOSIS — Z931 Gastrostomy status: Secondary | ICD-10-CM

## 2019-07-07 DIAGNOSIS — T85528S Displacement of other gastrointestinal prosthetic devices, implants and grafts, sequela: Secondary | ICD-10-CM

## 2019-07-07 DIAGNOSIS — R1013 Epigastric pain: Secondary | ICD-10-CM

## 2019-07-07 DIAGNOSIS — I739 Peripheral vascular disease, unspecified: Secondary | ICD-10-CM

## 2019-07-07 DIAGNOSIS — K56609 Unspecified intestinal obstruction, unspecified as to partial versus complete obstruction: Secondary | ICD-10-CM

## 2019-07-07 DIAGNOSIS — F419 Anxiety disorder, unspecified: Secondary | ICD-10-CM

## 2019-07-07 DIAGNOSIS — K222 Esophageal obstruction: Secondary | ICD-10-CM

## 2019-07-07 DIAGNOSIS — F5105 Insomnia due to other mental disorder: Secondary | ICD-10-CM

## 2019-07-07 LAB — CBC
HCT: 37.1 % — ABNORMAL LOW (ref 39.0–52.0)
HCT: 37.9 % — ABNORMAL LOW (ref 39.0–52.0)
Hemoglobin: 12.3 g/dL — ABNORMAL LOW (ref 13.0–17.0)
Hemoglobin: 12.5 g/dL — ABNORMAL LOW (ref 13.0–17.0)
MCH: 29.6 pg (ref 26.0–34.0)
MCH: 30.4 pg (ref 26.0–34.0)
MCHC: 32.5 g/dL (ref 30.0–36.0)
MCHC: 33.7 g/dL (ref 30.0–36.0)
MCV: 90.3 fL (ref 80.0–100.0)
MCV: 91.1 fL (ref 80.0–100.0)
Platelets: 366 10*3/uL (ref 150–400)
Platelets: 377 10*3/uL (ref 150–400)
RBC: 4.11 MIL/uL — ABNORMAL LOW (ref 4.22–5.81)
RBC: 4.16 MIL/uL — ABNORMAL LOW (ref 4.22–5.81)
RDW: 12.6 % (ref 11.5–15.5)
RDW: 12.6 % (ref 11.5–15.5)
WBC: 13.1 10*3/uL — ABNORMAL HIGH (ref 4.0–10.5)
WBC: 13.3 10*3/uL — ABNORMAL HIGH (ref 4.0–10.5)
nRBC: 0 % (ref 0.0–0.2)
nRBC: 0 % (ref 0.0–0.2)

## 2019-07-07 LAB — COMPREHENSIVE METABOLIC PANEL
ALT: 12 U/L (ref 0–44)
AST: 11 U/L — ABNORMAL LOW (ref 15–41)
Albumin: 3.2 g/dL — ABNORMAL LOW (ref 3.5–5.0)
Alkaline Phosphatase: 101 U/L (ref 38–126)
Anion gap: 8 (ref 5–15)
BUN: 10 mg/dL (ref 6–20)
CO2: 25 mmol/L (ref 22–32)
Calcium: 8.9 mg/dL (ref 8.9–10.3)
Chloride: 107 mmol/L (ref 98–111)
Creatinine, Ser: 0.9 mg/dL (ref 0.61–1.24)
GFR calc Af Amer: >60 mL/min (ref >60)
GFR calc non Af Amer: >60 mL/min (ref >60)
Glucose, Bld: 102 mg/dL — ABNORMAL HIGH (ref 70–99)
Potassium: 4.1 mmol/L (ref 3.5–5.1)
Sodium: 140 mmol/L (ref 135–145)
Total Bilirubin: 0.7 mg/dL (ref 0.3–1.2)
Total Protein: 6.6 g/dL (ref 6.5–8.1)

## 2019-07-07 LAB — CREATININE, SERUM
Creatinine, Ser: 0.84 mg/dL (ref 0.61–1.24)
GFR calc Af Amer: >60 mL/min (ref >60)
GFR calc non Af Amer: >60 mL/min (ref >60)

## 2019-07-07 LAB — HIV ANTIBODY (ROUTINE TESTING W REFLEX): HIV Screen 4th Generation wRfx: NONREACTIVE

## 2019-07-07 MED ORDER — NICOTINE 21 MG/24HR TD PT24
21.0000 mg | MEDICATED_PATCH | Freq: Every day | TRANSDERMAL | Status: DC
  Administered 2019-07-07 – 2019-07-13 (×7): 21 mg via TRANSDERMAL
  Filled 2019-07-07 (×7): qty 1

## 2019-07-07 MED ORDER — LACTATED RINGERS IV BOLUS
1000.0000 mL | Freq: Once | INTRAVENOUS | Status: AC
  Administered 2019-07-07: 1000 mL via INTRAVENOUS

## 2019-07-07 MED ORDER — SODIUM CHLORIDE 0.9 % IV SOLN: INTRAVENOUS | Status: AC | PRN

## 2019-07-07 MED ORDER — BUPRENORPHINE HCL-NALOXONE HCL 8-2 MG SL SUBL
1.0000 | SUBLINGUAL_TABLET | Freq: Two times a day (BID) | SUBLINGUAL | Status: DC
  Administered 2019-07-07 – 2019-07-09 (×5): 1 via SUBLINGUAL
  Filled 2019-07-07 (×5): qty 1

## 2019-07-07 MED ORDER — BUPRENORPHINE HCL 8 MG SL SUBL: 8.0000 mg | SUBLINGUAL_TABLET | Freq: Two times a day (BID) | SUBLINGUAL | Status: DC

## 2019-07-07 MED ORDER — DEXTROSE IN LACTATED RINGERS 5 % IV SOLN: INTRAVENOUS | Status: DC

## 2019-07-07 MED ORDER — LACTATED RINGERS IV BOLUS: 1000.0000 mL | Freq: Three times a day (TID) | INTRAVENOUS | Status: AC | PRN

## 2019-07-07 MED ORDER — ENALAPRILAT 1.25 MG/ML IV SOLN
0.6250 mg | Freq: Four times a day (QID) | INTRAVENOUS | Status: DC | PRN
  Filled 2019-07-07: qty 0.5
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
## 2019-07-07 NOTE — Progress Notes (Signed)
Initial Nutrition Assessment  RD working remotely.   DOCUMENTATION CODES:   Not applicable  INTERVENTION:  - diet advancement as medically feasible. - will monitor for possible plan to feed via PEG.  - will complete NFPE at follow-up.   NUTRITION DIAGNOSIS:   Inadequate oral intake related to acute illness, nausea, vomiting as evidenced by per patient/family report, NPO status.  GOAL:   Patient will meet greater than or equal to 90% of their needs  MONITOR:   Diet advancement  REASON FOR ASSESSMENT:   Malnutrition Screening Tool  ASSESSMENT:   54 y.o. male with medical history significant of HTN, polysubstance abuse, and stenosis of esophagus s/p stent placement. He presented to ED for evaluation of abdominal pain x3 days--around his bellybutton, colicky in nature, 10/10 on pain scale, radiates to his back, and he has associated N/V which has caused poor appetite. He has PEG but he is unsure if it is used for feeding. Outpatient GI MD encouraged patient to go to the ED after it was determined that esophageal stent had likely migrated.  Patient has been NPO since admission. Per chart review, weight today is 153 lb and weight on 05/19/19 was 162 lb and weight had been stable for 2 months (November-January). This indicates 9 lb weight loss (5.5% body weight) in the past 6 weeks; not significant for time frame.   Review of home medications indicates that as of 06/29/18, patient was receiving 474 ml Jevity 1.5 TID with 30 ml prostat once/day via PEG. This regimen provides 2230 kcal, 105 grams protein, and 1080 ml free water.   Per notes: - migrated esophageal stent  - no vomiting since admission so no plan for NGT placement at this time - enteritis due to stent migration  - GI and Surgery following   Labs reviewed. Medications reviewed; 10 mg rectal dulcolax x1 dose 2/24, 20 mg IV pepcid BID IVF; D5-LR @ 75 ml/hr (306 kcal).    NUTRITION - FOCUSED PHYSICAL EXAM:  unable  to complete at this time.   Diet Order:   Diet Order            Diet NPO time specified Except for: Ice Chips  Diet effective now              EDUCATION NEEDS:   No education needs have been identified at this time  Skin:  Skin Assessment: Reviewed RN Assessment  Last BM:  2/24  Height:   Ht Readings from Last 1 Encounters:  07/07/19 5\' 6"  (1.676 m)    Weight:   Wt Readings from Last 1 Encounters:  07/07/19 69.3 kg    Ideal Body Weight:  59.1 kg  BMI:  Body mass index is 24.66 kg/m.  Estimated Nutritional Needs:   Kcal:  2100-2300 kcal  Protein:  100-115  grams  Fluid:  >/= 2.2 L/day     07/09/19, MS, RD, LDN, CNSC Inpatient Clinical Dietitian RD pager # available in [facility]  After hours/weekend pager # available in [facility]
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
## 2019-07-07 NOTE — Progress Notes (Signed)
Unaware of patient pended due to pager not working. Pt accepted to 1316.

## 2019-07-07 NOTE — Plan of Care (Signed)
Plan of care discussed.   

## 2019-07-07 NOTE — Progress Notes (Signed)
Subjective/Chief Complaint: No complaints. Feels better   Objective: Vital signs in last 24 hours: Temp:  [97.6 F (36.4 C)-98.3 F (36.8 C)] 97.6 F (36.4 C) (02/25 0618) Pulse Rate:  [66-95] 66 (02/25 0618) Resp:  [11-23] 18 (02/25 0618) BP: (95-130)/(61-86) 125/86 (02/25 0618) SpO2:  [91 %-100 %] 100 % (02/25 0618) Weight:  [69.3 kg-74.8 kg] 69.3 kg (02/25 0059) Last BM Date: 07/06/19  Intake/Output from previous day: 02/24 0701 - 02/25 0700 In: 1234.2 [I.V.:40.1; IV Piggyback:1194.1] Out: -  Intake/Output this shift: No intake/output data recorded.  General appearance: alert and cooperative Resp: clear to auscultation bilaterally Cardio: regular rate and rhythm GI: soft, nontender. not distended  Lab Results:  Recent Labs    07/06/19 2347 07/07/19 0313  WBC 13.1* 13.3*  HGB 12.5* 12.3*  HCT 37.1* 37.9*  PLT 377 366   BMET Recent Labs    07/06/19 1800 07/06/19 1800 07/06/19 2347 07/07/19 0313  NA 136  --   --  140  K 4.0  --   --  4.1  CL 104  --   --  107  CO2 24  --   --  25  GLUCOSE 107*  --   --  102*  BUN 12  --   --  10  CREATININE 0.75   < > 0.84 0.90  CALCIUM 8.7*  --   --  8.9   < > = values in this interval not displayed.   PT/INR No results for input(s): LABPROT, INR in the last 72 hours. ABG No results for input(s): PHART, HCO3 in the last 72 hours.  Invalid input(s): PCO2, PO2  Studies/Results: DG Chest 2 View  Result Date: 07/06/2019 CLINICAL DATA:  Jewel stent placement EXAM: CHEST - 2 VIEW COMPARISON:  January 22, 2019. FINDINGS: Esophageal stent is not present within the chest. Lungs are clear. Heart size and pulmonary vascular normal. No adenopathy. No pneumomediastinum or pneumothorax. There is aortic atherosclerosis. There is mild degenerative change in the thoracic spine. IMPRESSION: Stent is not present in the chest. No pneumomediastinum or pneumothorax. Lungs clear. Cardiac silhouette normal. Aortic Atherosclerosis  (ICD10-I70.0). Electronically Signed   By: Bretta Bang III M.D.   On: 07/06/2019 16:31   CT ABDOMEN PELVIS W CONTRAST  Result Date: 07/06/2019 CLINICAL DATA:  Mild positioning of esophageal stent on abdominal x-ray performed earlier today, abdominal pain below gastrostomy tube EXAM: CT ABDOMEN AND PELVIS WITH CONTRAST TECHNIQUE: Multidetector CT imaging of the abdomen and pelvis was performed using the standard protocol following bolus administration of intravenous contrast. CONTRAST:  OMNIPAQUE IOHEXOL 300 MG/ML  SOLN COMPARISON:  07/06/2019, 03/03/2019 FINDINGS: Lower chest: No acute pleural or parenchymal lung disease. Hepatobiliary: No focal liver abnormality is seen. Status post cholecystectomy. No biliary dilatation. Pancreas: Unremarkable. No pancreatic ductal dilatation or surrounding inflammatory changes. Spleen: Normal in size without focal abnormality. Adrenals/Urinary Tract: Adrenal glands are unremarkable. Kidneys are normal, without renal calculi, focal lesion, or hydronephrosis. Bladder is unremarkable. Stomach/Bowel: The dislodged esophageal stent is seen on abdominal x-ray earlier today is identified on this exam within the mid to distal jejunum. Proximal small bowel dilatation consistent with obstruction as result of the dislodged stent. There is bowel wall thickening and surrounding mesenteric fat stranding at the site of obstruction within the distal jejunum lower pelvis. No evidence of pneumatosis or bowel perforation at this time. Percutaneous gastrostomy tube is seen within the gastric lumen. No complications related to the gastrostomy tube site. Vascular/Lymphatic: Aortic atherosclerosis. No  enlarged abdominal or pelvic lymph nodes. Reproductive: Prostate is unremarkable. Other: Mild mesenteric edema within the lower pelvis. No free fluid or free gas. Musculoskeletal: No acute or destructive bony lesions. Reconstructed images demonstrate no additional findings. IMPRESSION: 1.  Dislodged esophageal stent, seen within the distal jejunum. There is resulting wall thickening, surrounding fat stranding, and small bowel obstruction. Surgical consultation recommended. 2. Unremarkable percutaneous gastrostomy tube. Electronically Signed   By: Randa Ngo M.D.   On: 07/06/2019 19:23   DG Abd 2 Views  Result Date: 07/06/2019 CLINICAL DATA:  Migrated esophageal stent EXAM: ABDOMEN - 2 VIEW COMPARISON:  None. FINDINGS: Esophageal stent is in the pelvis, either in a loop of distal small bowel or in the sigmoid colon. There is no free air. There are loops of borderline dilated small bowel with multiple air-fluid levels. There are surgical clips in the left upper abdomen. There is an apparent gastrostomy catheter in the stomach region. IMPRESSION: Esophageal stent has migrated into the pelvis, either within a loop of distal small bowel or sigmoid colon. Borderline dilatation of several loops of small bowel with multiple air-fluid levels. Question a degree of enteritis versus early bowel obstruction. A degree of ileus could present similarly. No evident free air. Gastrostomy catheter in stomach region. With respect to location of the stent, correlation with CT may be helpful to assess for small bowel versus large bowel location. These results were called by telephone at the time of interpretation on 07/06/2019 at 4:30 pm to provider [facility] , who verbally acknowledged these results. Electronically Signed   By: Lowella Grip III M.D.   On: 07/06/2019 16:30    Anti-infectives: Anti-infectives (From admission, onward)   Start     Dose/Rate Route Frequency Ordered Stop   07/07/19 0100  piperacillin-tazobactam (ZOSYN) IVPB 3.375 g     3.375 g 12.5 mL/hr over 240 Minutes Intravenous Every 8 hours 07/06/19 2318     07/06/19 2015  piperacillin-tazobactam (ZOSYN) IVPB 3.375 g     3.375 g 100 mL/hr over 30 Minutes Intravenous  Once 07/06/19 2006 07/06/19 2204       Assessment/Plan: s/p * No surgery found * continue bowel rest for now  Will see what GI says about stent. Will it pass to an area where it could be removed endoscopically? Will follow with you and will be available for surgical removal of stent if needed No urgent surgery needed as he does not appear to be obstructed or perforated  LOS: 1 day    Autumn Messing III 07/07/2019

## 2019-07-07 NOTE — Progress Notes (Signed)
   07/07/19 0900  Clinical Encounter Type  Visited With Patient  Visit Type Initial;Spiritual support;Social support  Referral From Nurse  Consult/Referral To Chaplain  Spiritual Encounters  Spiritual Needs Emotional;Other (Comment) (Spiritual Care Conversation/Support)  Stress Factors  Patient Stress Factors None identified   I spoke with Vincent Clarke per spiritual care consult. Vincent Clarke did not identify any spiritual needs at this time.   Please, contact Spiritual Care for further assistance.   Chaplain Clint Bolder M.Div., [facility]
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
## 2019-07-07 NOTE — Progress Notes (Addendum)
PROGRESS NOTE    [name]  QPR:[id number] DOB: 07-Oct-1965 DOA: 07/06/2019 PCP: [name], MD    Brief Narrative:  Vincent Clarke is a 54 y.o. male with medical history significant of hypertension, polysubstance abuse and stenosis of esophagus s/p stent placement presented to ED for evaluation of abdominal pain for the last 3 days.  Patient states that pain started 3 days ago and is located around his bellybutton, colicky in nature, 10 out of 10 on pain scale and radiates to his back.  Pain is also associated with nausea and vomiting.  Patient also admits of poor appetite secondary to nausea and vomiting.  Patient also has a PEG tube for supplementation but he is not sure if it is used for feeding.  Patient states that that he contacted his GI doctor because of severe pain and image showed that the stent was not in the esophagus so his gastroenterologist recommended him to go to the ED for evaluation of abdominal pain due to possible stent migration.  Patient denies fever, chills, chest pain, palpitations, shortness of breath, diarrhea, melena, blood in the stool and urinary frequency patient states that his last bowel movement was today and he also admits of passing flatus.  In ED on arrival patient had temperature of 98 degrees F, blood pressure 95/69, heart rate 87, respiratory rate 20 and oxygen saturation 96% on room air.  Blood work showed WBC count of 16 while rest of the blood work was within normal limits. CT Abd/Pelvis was done that showed dislodged esophageal stent that was seen in distal jejunum there was also evidence of small bowel obstruction but no evidence of bowel perforation.  The ED physician discussed the case with gastroenterologist on-call and he recommended to consult the surgery and start the patient on IV Zosyn.  Dr. Peter Minium was consulted who recommends that patient should be admitted and GI and surgery will follow up with the patient.   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  Patient underwent x-ray per his GI physician that was notable for dislodgment of his esophageal stent, in which he directed him to the ED for further evaluation.  Patient with elevated WBC count of 6.0, lactic acid 1.3, urinalysis unrevealing.  CT abdomen/pelvis notable for dislodged esophageal stent distal jejunum with resultant wall thickening and fat stranding with associated small bowel obstruction, and's of pneumatosis or perforation. --WBC 18.4-->16.0-->13.1-->13.3 --General surgery following, appreciate assistance; no urgent surgical indication at this time --Awaiting further recommendations per GI --Continue n.p.o. --D5 LR at 75 mL's per hour --Zosyn --Monitor CBC daily --Serial abdominal exams  Essential hypertension BP 125/86 this morning, controlled.  On lisinopril 20 mg p.o. daily at home, currently on hold. --Vasotec IV prn q6h for SBP > 170 or DBP >  110  Polysubstance abuse UDS positive for amphetamines and opiates. --Continue home Suboxone 8 mg p.o. twice daily with hospital substitution  History of esophagitis --Pepcid 20 mg IV every 12 hours --Protonix 40 mg IV every 24 hours  Nicotine use  disorder Counseled on need for cessation. --Nicotine patch  DVT prophylaxis: Lovenox Code Status: Full code Family Communication: Discussed with patient extensively at bedside Disposition Plan:      Patient is from: Home     Anticipated Disposition:  Home     Barriers to discharge or conditions that needs to be met prior to discharge: Resolution of small bowel obstruction, possible need for dislodged esophageal stent removal, signed off from general surgery and gastroenterology  Consultants:   General surgery - Dr. Johney Maine, Dr. Durwin Nora [facility]  Procedures:   None  Antimicrobials:   Zosyn 2/24>>   Subjective: Patient seen and examined bedside, sleeping but easily arousable.  Continues with abdominal discomfort.  No worse/better since yesterday.  No other specific complaints at this time.  Denies headache, no fever/chills/night sweats, no chest pain, no palpitations, no shortness of breath.  No acute events overnight per nursing staff.  Objective: Vitals:   07/07/19 0030 07/07/19 0059 07/07/19 0119 07/07/19 0618  BP: 119/81  105/61 125/86  Pulse: 76  76 66  Resp: (!) 23  18 18   Temp:   98.2 F (36.8 C) 97.6 F (36.4 C)  TempSrc:   Oral Oral  SpO2: 94%  100% 100%  Weight:  69.3 kg    Height:  5' 6"  (1.676 m)      Intake/Output Summary (Last 24 hours) at 07/07/2019 1049 Last data filed at 07/07/2019 1000 Gross per 24 hour  Intake 1234.23 ml  Output 200 ml  Net 1034.23 ml   Filed Weights   07/06/19 1730 07/07/19 0059  Weight: 74.8 kg 69.3 kg    Examination:  General exam: Appears calm and comfortable  Respiratory system: Clear to auscultation. Respiratory effort normal.  Oxygenating well on room air Cardiovascular system: S1 & S2 heard, RRR. No JVD, murmurs, rubs, gallops or clicks. No pedal edema. Gastrointestinal system: Abdomen is nondistended, soft, mild epigastric tenderness on palpation.  Faint bowel sounds.  No masses/rebound/guarding, no appreciable  organomegaly. Central nervous system: Alert and oriented. No focal neurological deficits. Extremities: Symmetric 5 x 5 power. Skin: No rashes, lesions or ulcers Psychiatry: Judgement and insight appear normal. Mood & affect appropriate.     Data Reviewed: I have personally reviewed following labs and imaging studies  CBC: Recent Labs  Lab 07/06/19 1346 07/06/19 1800 07/06/19 2347 07/07/19 0313  WBC 18.4 Repeated and verified X2.* 16.0* 13.1* 13.3*  NEUTROABS 12.4* 10.8*  --   --   HGB 13.4 12.8* 12.5* 12.3*  HCT 39.7 38.2* 37.1* 37.9*  MCV 88.0 90.3 90.3 91.1  PLT 452.0* 411* 377 161   Basic Metabolic Panel: Recent Labs  Lab 07/06/19 1346 07/06/19 1800 07/06/19 2347 07/07/19 0313  NA 135 136  --  140  K 4.5 4.0  --  4.1  CL 101 104  --  107  CO2 27 24  --  25  GLUCOSE 106* 107*  --  102*  BUN 11 12  --  10  CREATININE 0.80 0.75 0.84 0.90  CALCIUM 9.4 8.7*  --  8.9   GFR: Estimated Creatinine Clearance: 84.7 mL/min (by C-G formula based on SCr of 0.9 mg/dL). Liver Function Tests: Recent Labs  Lab 07/06/19 1346 07/06/19 1800 07/07/19 0313  AST 10 13* 11*  ALT 8 11 12   ALKPHOS 115 107 101  BILITOT 0.4 0.5 0.7  PROT 7.1 6.9 6.6  ALBUMIN 3.7 3.5 3.2*   Recent Labs  Lab 07/06/19 1346 07/06/19 1800  LIPASE 11.0 18  AMYLASE 25*  --    No results for input(s): AMMONIA in the last 168 hours. Coagulation Profile: No results for input(s): INR, PROTIME in the last 168 hours. Cardiac Enzymes: No results for input(s): CKTOTAL, CKMB, CKMBINDEX, TROPONINI in the last 168 hours. BNP (last 3 results) No results for input(s): PROBNP in the last 8760 hours. HbA1C: No results for input(s): HGBA1C in the last 72 hours. CBG: No results for input(s): GLUCAP in the last 168 hours. Lipid Profile: No results for input(s): CHOL, HDL, LDLCALC, TRIG, CHOLHDL, LDLDIRECT in the last 72 hours. Thyroid Function Tests: No results for input(s): TSH, T4TOTAL, FREET4, T3FREE,  THYROIDAB in the last 72 hours. Anemia Panel: No results for input(s): VITAMINB12, FOLATE, FERRITIN, TIBC, IRON, RETICCTPCT in the last 72 hours. Sepsis Labs: Recent Labs  Lab 07/06/19 1940  LATICACIDVEN 1.3    Recent Results (from the past 240 hour(s))  Respiratory Panel by RT PCR (Flu A&B, Covid) - Nasopharyngeal Swab     Status: None   Collection Time: 07/06/19  8:39 PM   Specimen: Nasopharyngeal Swab  Result Value Ref Range Status   SARS Coronavirus 2 by RT PCR NEGATIVE NEGATIVE Final    Comment: (NOTE) SARS-CoV-2 target nucleic acids are NOT DETECTED. The SARS-CoV-2 RNA is generally detectable in upper respiratoy specimens during the acute phase of infection. The lowest concentration of SARS-CoV-2 viral copies this assay can detect is 131 copies/mL. A negative result does not preclude SARS-Cov-2 infection and should not be used as the sole basis for treatment or other patient management decisions. A negative result may occur with  improper specimen collection/handling, submission of specimen other than nasopharyngeal swab, presence of viral mutation(s) within the areas targeted by this assay, and inadequate number of viral copies (<131 copies/mL). A negative result must be combined with clinical observations, patient history, and epidemiological information. The expected result is Negative. Fact Sheet for Patients:  PinkCheek.be Fact Sheet for Healthcare Providers:  GravelBags.it This test is not yet ap proved or cleared by the [place] FDA and  has been authorized for detection and/or diagnosis of SARS-CoV-2 by FDA under an Emergency Use Authorization (EUA). This EUA will remain  in effect (meaning this test can be used) for the duration of the COVID-19 declaration under Section 564(b)(1) of the Act, 21 U.S.C. section 360bbb-3(b)(1), unless the authorization is terminated or revoked sooner.    Influenza A by  PCR NEGATIVE NEGATIVE Final   Influenza B by PCR NEGATIVE NEGATIVE Final    Comment: (NOTE) The Xpert Xpress SARS-CoV-2/FLU/RSV assay is intended as an aid in  the diagnosis of influenza from Nasopharyngeal swab specimens and  should not be used as a sole basis for treatment. Nasal washings and  aspirates are unacceptable for Xpert Xpress SARS-CoV-2/FLU/RSV  testing. Fact Sheet for Patients: PinkCheek.be Fact Sheet for Healthcare Providers: GravelBags.it This test is not yet approved or cleared by the [place] FDA and  has been authorized for detection and/or diagnosis of SARS-CoV-2 by  FDA under an Emergency Use Authorization (EUA). This EUA will remain  in effect (meaning this test can be used) for the duration of the  Covid-19 declaration under Section 564(b)(1) of the Act, 21  U.S.C. section 360bbb-3(b)(1), unless the authorization  is  terminated or revoked. Performed at [facility], [place] [address] [place], [place] [address]          Radiology Studies: DG Chest 2 View  Result Date: 07/06/2019 CLINICAL DATA:  Jewel stent placement EXAM: CHEST - 2 VIEW COMPARISON:  January 22, 2019. FINDINGS: Esophageal stent is not present within the chest. Lungs are clear. Heart size and pulmonary vascular normal. No adenopathy. No pneumomediastinum or pneumothorax. There is aortic atherosclerosis. There is mild degenerative change in the thoracic spine. IMPRESSION: Stent is not present in the chest. No pneumomediastinum or pneumothorax. Lungs clear. Cardiac silhouette normal. Aortic Atherosclerosis (ICD10-I70.0). Electronically Signed   By: Lowella Grip III M.D.   On: 07/06/2019 16:31   CT ABDOMEN PELVIS W CONTRAST  Result Date: 07/06/2019 CLINICAL DATA:  Mild positioning of esophageal stent on abdominal x-ray performed earlier today, abdominal pain below gastrostomy tube EXAM: CT ABDOMEN AND PELVIS WITH  CONTRAST TECHNIQUE: Multidetector CT imaging of the abdomen and pelvis was performed using the standard protocol following bolus administration of intravenous contrast. CONTRAST:  179m OMNIPAQUE IOHEXOL 300 MG/ML  SOLN COMPARISON:  07/06/2019, 03/03/2019 FINDINGS: Lower chest: No acute pleural or parenchymal lung disease. Hepatobiliary: No focal liver abnormality is seen. Status post cholecystectomy. No biliary dilatation. Pancreas: Unremarkable. No pancreatic ductal dilatation or surrounding inflammatory changes. Spleen: Normal in size without focal abnormality. Adrenals/Urinary Tract: Adrenal glands are unremarkable. Kidneys are normal, without renal calculi, focal lesion, or hydronephrosis. Bladder is unremarkable. Stomach/Bowel: The dislodged esophageal stent is seen on abdominal x-ray earlier today is identified on this exam within the mid to distal jejunum. Proximal small bowel dilatation consistent with obstruction as result of the dislodged stent. There is bowel wall thickening and surrounding mesenteric fat stranding at the site of obstruction within the distal jejunum lower pelvis. No evidence of pneumatosis or bowel perforation at this time. Percutaneous gastrostomy tube is seen within the gastric lumen. No complications related to the gastrostomy tube site. Vascular/Lymphatic: Aortic atherosclerosis. No enlarged abdominal or pelvic lymph nodes. Reproductive: Prostate is unremarkable. Other: Mild mesenteric edema within the lower pelvis. No free fluid or free gas. Musculoskeletal: No acute or destructive bony lesions. Reconstructed images demonstrate no additional findings. IMPRESSION: 1. Dislodged esophageal stent, seen within the distal jejunum. There is resulting wall thickening, surrounding fat stranding, and small bowel obstruction. Surgical consultation recommended. 2. Unremarkable percutaneous gastrostomy tube. Electronically Signed   By: MRanda NgoM.D.   On: 07/06/2019 19:23   DG Abd 2  Views  Result Date: 07/06/2019 CLINICAL DATA:  Migrated esophageal stent EXAM: ABDOMEN - 2 VIEW COMPARISON:  None. FINDINGS: Esophageal stent is in the pelvis, either in a loop of distal small bowel or in the sigmoid colon. There is no free air. There are loops of borderline dilated small bowel with multiple air-fluid levels. There are surgical clips in the left upper abdomen. There is an apparent gastrostomy catheter in the stomach region. IMPRESSION: Esophageal stent has migrated into the pelvis, either within a loop of distal small bowel or sigmoid colon. Borderline dilatation of several loops of small bowel with multiple air-fluid levels. Question a degree of enteritis versus early bowel obstruction. A degree of ileus could present similarly. No evident free air. Gastrostomy catheter in stomach region. With respect to location of the stent, correlation with CT may be helpful to assess for small bowel versus large bowel location. These results were called by telephone at the time of interpretation on 07/06/2019 at 4:30 pm  to provider Irving Copas , who verbally acknowledged these results. Electronically Signed   By: Lowella Grip III M.D.   On: 07/06/2019 16:30        Scheduled Meds: . bisacodyl  10 mg Rectal Daily  . buprenorphine-naloxone  1 tablet Sublingual BID  . enoxaparin (LOVENOX) injection  40 mg Subcutaneous QHS  . lip balm  1 application Topical BID  . pantoprazole (PROTONIX) IV  40 mg Intravenous QHS   Continuous Infusions: . sodium chloride Stopped (07/07/19 0150)  . dextrose 5% lactated ringers 75 mL/hr at 07/07/19 0710  . famotidine (PEPCID) IV Stopped (07/06/19 2249)  . lactated ringers    . lactated ringers    . methocarbamol (ROBAXIN) IV    . ondansetron (ZOFRAN) IV    . piperacillin-tazobactam (ZOSYN)  IV 3.375 g (07/07/19 0605)     LOS: 1 day    Time spent: 39 minutes spent on chart review, discussion with nursing staff, consultants, updating family  and interview/physical exam; more than 50% of that time was spent in counseling and/or coordination of care.    Trigo Winterbottom J [place] DO [facility] Available via Epic secure chat 7am-7pm After these hours, please refer to coverage provider listed on [email or web address] 07/07/2019, 10:49 AM

## 2019-07-07 NOTE — ED Notes (Signed)
Called to give reports informed they were not aware of new pt and to please called back in 20 mins.

## 2019-07-07 NOTE — Telephone Encounter (Signed)
He has been admitted to the medical service, CT scan showing stent in the distal jejunum causing obstruction.  Recommended antibiotics and surgical consultation.  Our inpatient consult team is aware.

## 2019-07-07 NOTE — Plan of Care (Signed)

## 2019-07-07 NOTE — Telephone Encounter (Signed)
Thank you for update. GM 

## 2019-07-08 ENCOUNTER — Encounter (HOSPITAL_COMMUNITY)
Admission: EM | Disposition: A | Discharge status: Home / Self Care | Payer: Self-pay | Source: Ambulatory Visit | Attending: Internal Medicine

## 2019-07-08 ENCOUNTER — Inpatient Hospital Stay (HOSPITAL_COMMUNITY): Payer: Medicaid Other | Admitting: Certified Registered Nurse Anesthetist

## 2019-07-08 ENCOUNTER — Inpatient Hospital Stay (HOSPITAL_COMMUNITY): Payer: Medicaid Other

## 2019-07-08 ENCOUNTER — Encounter (HOSPITAL_COMMUNITY): Payer: Self-pay | Admitting: Internal Medicine

## 2019-07-08 DIAGNOSIS — T85528A Displacement of other gastrointestinal prosthetic devices, implants and grafts, initial encounter: Principal | ICD-10-CM

## 2019-07-08 HISTORY — PX: LAPAROSCOPY: SHX197

## 2019-07-08 LAB — CBC
HCT: 34.2 % — ABNORMAL LOW (ref 39.0–52.0)
Hemoglobin: 11.2 g/dL — ABNORMAL LOW (ref 13.0–17.0)
MCH: 29.9 pg (ref 26.0–34.0)
MCHC: 32.7 g/dL (ref 30.0–36.0)
MCV: 91.2 fL (ref 80.0–100.0)
Platelets: 332 10*3/uL (ref 150–400)
RBC: 3.75 MIL/uL — ABNORMAL LOW (ref 4.22–5.81)
RDW: 12.5 % (ref 11.5–15.5)
WBC: 7.7 10*3/uL (ref 4.0–10.5)
nRBC: 0 % (ref 0.0–0.2)

## 2019-07-08 LAB — BASIC METABOLIC PANEL
Anion gap: 11 (ref 5–15)
BUN: 11 mg/dL (ref 6–20)
CO2: 25 mmol/L (ref 22–32)
Calcium: 8.6 mg/dL — ABNORMAL LOW (ref 8.9–10.3)
Chloride: 108 mmol/L (ref 98–111)
Creatinine, Ser: 0.81 mg/dL (ref 0.61–1.24)
GFR calc Af Amer: >60 mL/min (ref >60)
GFR calc non Af Amer: >60 mL/min (ref >60)
Glucose, Bld: 97 mg/dL (ref 70–99)
Potassium: 3.3 mmol/L — ABNORMAL LOW (ref 3.5–5.1)
Sodium: 144 mmol/L (ref 135–145)

## 2019-07-08 SURGERY — LAPAROSCOPY, DIAGNOSTIC
Anesthesia: General

## 2019-07-08 MED ORDER — LIDOCAINE 2% (20 MG/ML) 5 ML SYRINGE
INTRAMUSCULAR | Status: DC | PRN
  Administered 2019-07-08: 50 mg via INTRAVENOUS

## 2019-07-08 MED ORDER — PROPOFOL 10 MG/ML IV BOLUS
INTRAVENOUS | Status: DC | PRN
  Administered 2019-07-08: 150 mg via INTRAVENOUS

## 2019-07-08 MED ORDER — KCL IN DEXTROSE-NACL 20-5-0.9 MEQ/L-%-% IV SOLN
INTRAVENOUS | Status: DC
  Filled 2019-07-08 (×8): qty 1000

## 2019-07-08 MED ORDER — HYDROMORPHONE HCL 1 MG/ML IJ SOLN
INTRAMUSCULAR | Status: AC
  Filled 2019-07-08: qty 1

## 2019-07-08 MED ORDER — POTASSIUM CHLORIDE 10 MEQ/100ML IV SOLN
10.0000 meq | INTRAVENOUS | Status: AC
  Administered 2019-07-08 (×4): 10 meq via INTRAVENOUS
  Filled 2019-07-08 (×3): qty 100

## 2019-07-08 MED ORDER — ONDANSETRON HCL 4 MG/2ML IJ SOLN
INTRAMUSCULAR | Status: AC
  Filled 2019-07-08: qty 2

## 2019-07-08 MED ORDER — ONDANSETRON HCL 4 MG/2ML IJ SOLN: 4.0000 mg | Freq: Once | INTRAMUSCULAR | Status: DC | PRN

## 2019-07-08 MED ORDER — HYDROMORPHONE HCL 1 MG/ML IJ SOLN
0.2500 mg | INTRAMUSCULAR | Status: DC | PRN
  Administered 2019-07-08 (×4): 0.5 mg via INTRAVENOUS

## 2019-07-08 MED ORDER — ESMOLOL HCL 100 MG/10ML IV SOLN
INTRAVENOUS | Status: AC
  Filled 2019-07-08: qty 10

## 2019-07-08 MED ORDER — BUPIVACAINE HCL (PF) 0.25 % IJ SOLN
INTRAMUSCULAR | Status: DC | PRN
  Administered 2019-07-08: 9 mL

## 2019-07-08 MED ORDER — SUCCINYLCHOLINE CHLORIDE 200 MG/10ML IV SOSY
PREFILLED_SYRINGE | INTRAVENOUS | Status: AC
  Filled 2019-07-08: qty 10

## 2019-07-08 MED ORDER — FENTANYL CITRATE (PF) 250 MCG/5ML IJ SOLN
INTRAMUSCULAR | Status: DC | PRN
  Administered 2019-07-08: 50 ug via INTRAVENOUS
  Administered 2019-07-08: 100 ug via INTRAVENOUS
  Administered 2019-07-08 (×2): 50 ug via INTRAVENOUS

## 2019-07-08 MED ORDER — FENTANYL CITRATE (PF) 100 MCG/2ML IJ SOLN
INTRAMUSCULAR | Status: AC
  Administered 2019-07-08: 50 ug via INTRAVENOUS
  Filled 2019-07-08: qty 2

## 2019-07-08 MED ORDER — ONDANSETRON HCL 4 MG/2ML IJ SOLN
INTRAMUSCULAR | Status: DC | PRN
  Administered 2019-07-08: 4 mg via INTRAVENOUS

## 2019-07-08 MED ORDER — DEXMEDETOMIDINE HCL IN NACL 200 MCG/50ML IV SOLN
INTRAVENOUS | Status: AC
  Filled 2019-07-08: qty 50

## 2019-07-08 MED ORDER — DEXMEDETOMIDINE HCL IN NACL 400 MCG/100ML IV SOLN
INTRAVENOUS | Status: DC | PRN
  Administered 2019-07-08 (×9): 4 ug via INTRAVENOUS

## 2019-07-08 MED ORDER — LACTATED RINGERS IV SOLN: INTRAVENOUS | Status: DC

## 2019-07-08 MED ORDER — FENTANYL CITRATE (PF) 250 MCG/5ML IJ SOLN
INTRAMUSCULAR | Status: AC
  Filled 2019-07-08: qty 5

## 2019-07-08 MED ORDER — LIDOCAINE 2% (20 MG/ML) 5 ML SYRINGE
INTRAMUSCULAR | Status: AC
  Filled 2019-07-08: qty 5

## 2019-07-08 MED ORDER — FENTANYL CITRATE (PF) 100 MCG/2ML IJ SOLN
25.0000 ug | INTRAMUSCULAR | Status: DC | PRN
  Administered 2019-07-08: 50 ug via INTRAVENOUS

## 2019-07-08 MED ORDER — OXYCODONE HCL 5 MG/5ML PO SOLN: 5.0000 mg | Freq: Once | ORAL | Status: DC | PRN

## 2019-07-08 MED ORDER — DEXAMETHASONE SODIUM PHOSPHATE 10 MG/ML IJ SOLN
INTRAMUSCULAR | Status: DC | PRN
  Administered 2019-07-08: 8 mg via INTRAVENOUS

## 2019-07-08 MED ORDER — DEXAMETHASONE SODIUM PHOSPHATE 10 MG/ML IJ SOLN
INTRAMUSCULAR | Status: AC
  Filled 2019-07-08: qty 1

## 2019-07-08 MED ORDER — SODIUM CHLORIDE 0.9 % IR SOLN
Status: DC | PRN
  Administered 2019-07-08 (×2): 1000 mL

## 2019-07-08 MED ORDER — MORPHINE SULFATE (PF) 2 MG/ML IV SOLN
2.0000 mg | Freq: Once | INTRAVENOUS | Status: AC
  Administered 2019-07-08: 2 mg via INTRAVENOUS
  Filled 2019-07-08: qty 1

## 2019-07-08 MED ORDER — KETAMINE HCL 10 MG/ML IJ SOLN
INTRAMUSCULAR | Status: AC
  Filled 2019-07-08: qty 1

## 2019-07-08 MED ORDER — ROCURONIUM BROMIDE 10 MG/ML (PF) SYRINGE
PREFILLED_SYRINGE | INTRAVENOUS | Status: DC | PRN
  Administered 2019-07-08: 50 mg via INTRAVENOUS
  Administered 2019-07-08 (×2): 10 mg via INTRAVENOUS

## 2019-07-08 MED ORDER — KETAMINE HCL 10 MG/ML IJ SOLN
INTRAMUSCULAR | Status: DC | PRN
  Administered 2019-07-08: 50 mg via INTRAVENOUS
  Administered 2019-07-08 (×2): 10 mg via INTRAVENOUS

## 2019-07-08 MED ORDER — ROCURONIUM BROMIDE 10 MG/ML (PF) SYRINGE
PREFILLED_SYRINGE | INTRAVENOUS | Status: AC
  Filled 2019-07-08: qty 10

## 2019-07-08 MED ORDER — MIDAZOLAM HCL 2 MG/2ML IJ SOLN
INTRAMUSCULAR | Status: AC
  Filled 2019-07-08: qty 2

## 2019-07-08 MED ORDER — MIDAZOLAM HCL 5 MG/5ML IJ SOLN
INTRAMUSCULAR | Status: DC | PRN
  Administered 2019-07-08: 2 mg via INTRAVENOUS

## 2019-07-08 MED ORDER — SUGAMMADEX SODIUM 200 MG/2ML IV SOLN
INTRAVENOUS | Status: DC | PRN
  Administered 2019-07-08: 138.6 mg via INTRAVENOUS

## 2019-07-08 MED ORDER — BUPIVACAINE HCL (PF) 0.25 % IJ SOLN
INTRAMUSCULAR | Status: AC
  Filled 2019-07-08: qty 30

## 2019-07-08 MED ORDER — OXYCODONE HCL 5 MG PO TABS: 5.0000 mg | ORAL_TABLET | Freq: Once | ORAL | Status: DC | PRN

## 2019-07-08 SURGICAL SUPPLY — 41 items
BENZOIN TINCTURE PRP APPL 2/3 (GAUZE/BANDAGES/DRESSINGS) IMPLANT
COVER WAND RF STERILE (DRAPES) IMPLANT
DECANTER SPIKE VIAL GLASS SM (MISCELLANEOUS) IMPLANT
DERMABOND ADVANCED (GAUZE/BANDAGES/DRESSINGS) ×1
DERMABOND ADVANCED .7 DNX12 (GAUZE/BANDAGES/DRESSINGS) ×1 IMPLANT
DRAPE INCISE 23X17 IOBAN STRL (DRAPES) ×1
DRAPE INCISE IOBAN 23X17 STRL (DRAPES) ×1 IMPLANT
DRSG OPSITE POSTOP 4X6 (GAUZE/BANDAGES/DRESSINGS) ×2 IMPLANT
ELECT REM PT RETURN 15FT ADLT (MISCELLANEOUS) ×2 IMPLANT
GAUZE SPONGE 2X2 8PLY STRL LF (GAUZE/BANDAGES/DRESSINGS) ×3 IMPLANT
GLOVE BIO SURGEON STRL SZ7.5 (GLOVE) ×4 IMPLANT
GLOVE BIOGEL PI IND STRL 7.0 (GLOVE) ×1 IMPLANT
GLOVE BIOGEL PI INDICATOR 7.0 (GLOVE) ×1
GOWN STRL REUS W/ TWL XL LVL3 (GOWN DISPOSABLE) ×1 IMPLANT
GOWN STRL REUS W/TWL LRG LVL3 (GOWN DISPOSABLE) ×2 IMPLANT
GOWN STRL REUS W/TWL XL LVL3 (GOWN DISPOSABLE) ×3 IMPLANT
IRRIG SUCT STRYKERFLOW 2 WTIP (MISCELLANEOUS)
IRRIGATION SUCT STRKRFLW 2 WTP (MISCELLANEOUS) IMPLANT
KIT BASIN OR (CUSTOM PROCEDURE TRAY) ×2 IMPLANT
KIT TURNOVER KIT A (KITS) IMPLANT
LIGASURE IMPACT 36 18CM CVD LR (INSTRUMENTS) ×2 IMPLANT
RELOAD PROXIMATE 75MM BLUE (ENDOMECHANICALS) ×4 IMPLANT
SET TUBE SMOKE EVAC HIGH FLOW (TUBING) ×2 IMPLANT
SHEARS HARMONIC ACE PLUS 36CM (ENDOMECHANICALS) IMPLANT
SLEEVE SCD COMPRESS KNEE MED (MISCELLANEOUS) ×2 IMPLANT
SOL ANTI FOG 6CC (MISCELLANEOUS) ×1 IMPLANT
SOLUTION ANTI FOG 6CC (MISCELLANEOUS) ×1
SPONGE GAUZE 2X2 STER 10/PKG (GAUZE/BANDAGES/DRESSINGS) ×3
STAPLER GUN LINEAR PROX 60 (STAPLE) ×2 IMPLANT
STAPLER PROXIMATE 75MM BLUE (STAPLE) ×2 IMPLANT
STRIP CLOSURE SKIN 1/2X4 (GAUZE/BANDAGES/DRESSINGS) IMPLANT
SUT PDS AB 1 TP1 96 (SUTURE) ×4 IMPLANT
SUT VIC AB 4-0 PS2 27 (SUTURE) IMPLANT
SYR 10ML ECCENTRIC (SYRINGE) ×2 IMPLANT
TOWEL OR 17X26 10 PK STRL BLUE (TOWEL DISPOSABLE) ×2 IMPLANT
TRAY FOLEY MTR SLVR 16FR STAT (SET/KITS/TRAYS/PACK) ×2 IMPLANT
TRAY LAPAROSCOPIC (CUSTOM PROCEDURE TRAY) ×2 IMPLANT
TROCAR XCEL BLUNT TIP 100MML (ENDOMECHANICALS) ×2 IMPLANT
TROCAR XCEL NON-BLD 11X100MML (ENDOMECHANICALS) IMPLANT
TROCAR XCEL UNIV SLVE 11M 100M (ENDOMECHANICALS) IMPLANT
WATER STERILE IRR 1000ML POUR (IV SOLUTION) ×2 IMPLANT

## 2019-07-08 NOTE — Progress Notes (Signed)
Day of Surgery   Subjective/Chief Complaint: Complains of more abd pain today   Objective: Vital signs in last 24 hours: Temp:  [97.7 F (36.5 C)-98.3 F (36.8 C)] 97.7 F (36.5 C) (02/26 0602) Pulse Rate:  [62-67] 62 (02/26 0602) Resp:  [14-18] 16 (02/26 0602) BP: (117-118)/(62-73) 117/64 (02/26 0602) SpO2:  [97 %-99 %] 98 % (02/26 0602) Last BM Date: 07/07/19  Intake/Output from previous day: 02/25 0701 - 02/26 0700 In: 2769.2 [P.O.:1464; I.V.:955.2; IV Piggyback:350.1] Out: 5400 [Urine:800; Drains:4600] Intake/Output this shift: No intake/output data recorded.  General appearance: alert and cooperative Resp: clear to auscultation bilaterally Cardio: regular rate and rhythm GI: soft, moderate lower abd tenderness  Lab Results:  Recent Labs    07/07/19 0313 07/08/19 0326  WBC 13.3* 7.7  HGB 12.3* 11.2*  HCT 37.9* 34.2*  PLT 366 332   BMET Recent Labs    07/07/19 0313 07/08/19 0326  NA 140 144  K 4.1 3.3*  CL 107 108  CO2 25 25  GLUCOSE 102* 97  BUN 10 11  CREATININE 0.90 0.81  CALCIUM 8.9 8.6*   PT/INR No results for input(s): LABPROT, INR in the last 72 hours. ABG No results for input(s): PHART, HCO3 in the last 72 hours.  Invalid input(s): PCO2, PO2  Studies/Results: DG Chest 2 View  Result Date: 07/06/2019 CLINICAL DATA:  Jewel stent placement EXAM: CHEST - 2 VIEW COMPARISON:  January 22, 2019. FINDINGS: Esophageal stent is not present within the chest. Lungs are clear. Heart size and pulmonary vascular normal. No adenopathy. No pneumomediastinum or pneumothorax. There is aortic atherosclerosis. There is mild degenerative change in the thoracic spine. IMPRESSION: Stent is not present in the chest. No pneumomediastinum or pneumothorax. Lungs clear. Cardiac silhouette normal. Aortic Atherosclerosis (ICD10-I70.0). Electronically Signed   By: Bretta Bang III M.D.   On: 07/06/2019 16:31   CT ABDOMEN PELVIS W CONTRAST  Result Date:  07/06/2019 CLINICAL DATA:  Mild positioning of esophageal stent on abdominal x-ray performed earlier today, abdominal pain below gastrostomy tube EXAM: CT ABDOMEN AND PELVIS WITH CONTRAST TECHNIQUE: Multidetector CT imaging of the abdomen and pelvis was performed using the standard protocol following bolus administration of intravenous contrast. CONTRAST:  OMNIPAQUE IOHEXOL 300 MG/ML  SOLN COMPARISON:  07/06/2019, 03/03/2019 FINDINGS: Lower chest: No acute pleural or parenchymal lung disease. Hepatobiliary: No focal liver abnormality is seen. Status post cholecystectomy. No biliary dilatation. Pancreas: Unremarkable. No pancreatic ductal dilatation or surrounding inflammatory changes. Spleen: Normal in size without focal abnormality. Adrenals/Urinary Tract: Adrenal glands are unremarkable. Kidneys are normal, without renal calculi, focal lesion, or hydronephrosis. Bladder is unremarkable. Stomach/Bowel: The dislodged esophageal stent is seen on abdominal x-ray earlier today is identified on this exam within the mid to distal jejunum. Proximal small bowel dilatation consistent with obstruction as result of the dislodged stent. There is bowel wall thickening and surrounding mesenteric fat stranding at the site of obstruction within the distal jejunum lower pelvis. No evidence of pneumatosis or bowel perforation at this time. Percutaneous gastrostomy tube is seen within the gastric lumen. No complications related to the gastrostomy tube site. Vascular/Lymphatic: Aortic atherosclerosis. No enlarged abdominal or pelvic lymph nodes. Reproductive: Prostate is unremarkable. Other: Mild mesenteric edema within the lower pelvis. No free fluid or free gas. Musculoskeletal: No acute or destructive bony lesions. Reconstructed images demonstrate no additional findings. IMPRESSION: 1. Dislodged esophageal stent, seen within the distal jejunum. There is resulting wall thickening, surrounding fat stranding, and small bowel  obstruction. Surgical consultation  recommended. 2. Unremarkable percutaneous gastrostomy tube. Electronically Signed   By: Randa Ngo M.D.   On: 07/06/2019 19:23   DG Abd 2 Views  Result Date: 07/06/2019 CLINICAL DATA:  Migrated esophageal stent EXAM: ABDOMEN - 2 VIEW COMPARISON:  None. FINDINGS: Esophageal stent is in the pelvis, either in a loop of distal small bowel or in the sigmoid colon. There is no free air. There are loops of borderline dilated small bowel with multiple air-fluid levels. There are surgical clips in the left upper abdomen. There is an apparent gastrostomy catheter in the stomach region. IMPRESSION: Esophageal stent has migrated into the pelvis, either within a loop of distal small bowel or sigmoid colon. Borderline dilatation of several loops of small bowel with multiple air-fluid levels. Question a degree of enteritis versus early bowel obstruction. A degree of ileus could present similarly. No evident free air. Gastrostomy catheter in stomach region. With respect to location of the stent, correlation with CT may be helpful to assess for small bowel versus large bowel location. These results were called by telephone at the time of interpretation on 07/06/2019 at 4:30 pm to provider [facility] , who verbally acknowledged these results. Electronically Signed   By: Lowella Grip III M.D.   On: 07/06/2019 16:30   DG Abd Portable 1V  Result Date: 07/08/2019 CLINICAL DATA:  Migrated esophageal stent EXAM: PORTABLE ABDOMEN - 1 VIEW COMPARISON:  Radiograph 07/06/2019, CT 07/06/2019 FINDINGS: A an esophageal stent projects over the right hemipelvis in a similar location to that which was seen on the comparison CT and radiograph 1 day prior. Multiple air-filled loops of bowel in the abdomen are similar to the comparison exam. These loops are at the upper limits of normal for size but without high-grade obstructive pattern at this time. Surgical clips present in the right  upper quadrant. Percutaneous gastrostomy tube projects over the upper abdomen. IMPRESSION: Stable position of the migrated esophageal stent projecting over the pelvis. Scattered air-filled loops of bowel within the abdomen but without convincing features of high-grade obstruction. Electronically Signed   By: Lovena Le M.D.   On: 07/08/2019 06:52    Anti-infectives: Anti-infectives (From admission, onward)   Start     Dose/Rate Route Frequency Ordered Stop   07/07/19 0100  piperacillin-tazobactam (ZOSYN) IVPB 3.375 g     3.375 g 12.5 mL/hr over 240 Minutes Intravenous Every 8 hours 07/06/19 2318     07/06/19 2015  piperacillin-tazobactam (ZOSYN) IVPB 3.375 g     3.375 g 100 mL/hr over 30 Minutes Intravenous  Once 07/06/19 2006 07/06/19 2204      Assessment/Plan: s/p Procedure(s): LAPAROSCOPIC ASSISTED REMOVAL FOREIGN BODY FROM SMALL INTESTINE (N/A) plan for surgery today to remove stent  Risks and benefits of the surgery discussed with the pt and he understands and wishes to proceed  LOS: 2 days    Autumn Messing III 07/08/2019

## 2019-07-08 NOTE — Op Note (Signed)
07/06/2019 - 07/08/2019  5:43 PM  PATIENT:  Vincent Clarke  54 y.o. male  PRE-OPERATIVE DIAGNOSIS:  small intestine foreign body: esophogeal stent that has migrated  POST-OPERATIVE DIAGNOSIS:  small intestine foreign body: esophogeal stent that has   PROCEDURE:  Procedure(s): LAPAROSCOPIC ASSISTED REMOVAL FOREIGN BODY FROM SMALL INTESTINE OPEN SMALL BOWEL RESECTION (N/A)  SURGEON:  Surgeon(s) and Role:    * Jovita Kussmaul, MD - Primary  PHYSICIAN ASSISTANT:   ASSISTANTS: Margie Billet, PA   ANESTHESIA:   general  EBL:  Minimal   BLOOD ADMINISTERED:none  DRAINS: none   LOCAL MEDICATIONS USED:  MARCAINE     SPECIMEN:  Source of Specimen:  small bowel with stent  DISPOSITION OF SPECIMEN:  PATHOLOGY  COUNTS:  YES  TOURNIQUET:  * No tourniquets in log *  DICTATION: .Dragon Dictation   After informed consent was obtained the patient was brought to the operating room and placed in the supine position on the operating table.  After adequate induction of general anesthesia the patient's abdomen was prepped with ChloraPrep, allowed to dry, and draped in usual sterile manner.  An appropriate timeout was performed.  A site was chosen in the left upper quadrant to access the abdominal cavity.  This area was infiltrated with quarter percent Marcaine.  A small stab incision was made with a 15 blade knife.  A 5 mm Optiview port and camera were used to bluntly dissected the layers of the abdominal wall under direct vision until access was gained to the abdominal cavity.  The abdomen was then insufflated with carbon dioxide without difficulty.  Another 5 mm port was placed in the left midabdomen.  There was a segment of small bowel that was fairly densely adherent to the anterior abdominal wall.  Most of this was able to be separated by blunt dissection but some of it was too stuck.  I then decided to make a lower midline incision with a 10 blade knife.  The incision was carried through the skin  and subcutaneous tissue sharply with the electrocautery until the linea alba is identified.  The linea alba was incised with the electrocautery.  The preperitoneal space was probed bluntly with a hemostat until the peritoneum was opened and access was gained to the abdominal cavity.  The rest of the incision was opened under direct vision.  The small bowel was taken down from the anterior abdominal wall the rest of the way with sharp dissection with the Metzenbaum scissors.  The small bowel was also stuck down in the pelvis to the posterior wall the bladder and this was to also taken down sharply with Metzenbaum scissors.  Once the segment of small bowel was freed it became obvious that there was a perforation from the stent that had walled itself off on the anterior abdominal wall and bladder.  A site was chosen above and below the segment where the small bowel appeared normal.  The mesentery at this point was opened sharply with the electrocautery.  Aged GIA-75 stapler was placed across the small bowel at each of these points, clamped, and fired thereby dividing the small bowel between staple lines.  The mesentery was taken down with the LigaSure.  The segment with the stent was then removed from the patient and sent to pathology for further evaluation with a stitch on the proximal segment.  The 2 segments of small bowel easily approximated each other.  A small opening was made with the cautery on the antimesenteric  side of each limb of small bowel.  Each limb of a GIA-75 stapler was then placed down the appropriate limb of small bowel, clamped, and fired thereby creating a nice widely patent enteroenterostomy.  The common opening was closed with a TA 60 stapler.  The staple line was then imbricated with multiple 2-0 silk Lembert stitches as well as a 2-0 silk crotch stitch.  The mesenteric defect was closed with 2-0 silk figure-of-eight stitches.  Once this was accomplished the anastomosis appeared to be healthy  and widely patent with good blood supply and no tension.  The small bowel was placed back in the right lower quadrant.  The abdomen was irrigated with copious amounts of saline.  The fascia of the anterior abdominal wall was then closed with 2 running #1 double-stranded looped PDS sutures.  The subcutaneous tissue was irrigated with saline and the skin incisions were closed with staples.  Sterile dressings were applied.  Patient tolerated procedure well.  At the end of the case all needle sponge and instrument counts were correct.  The patient was then awakened and taken recovery in stable condition.  PLAN OF CARE: Admit to inpatient   PATIENT DISPOSITION:  PACU - hemodynamically stable.   Delay start of Pharmacological VTE agent (>24hrs) due to surgical blood loss or risk of bleeding: no

## 2019-07-08 NOTE — Progress Notes (Addendum)
PROGRESS NOTE    Vincent Clarke  YQI:[id number] DOB: 06-Aug-1965 DOA: 07/06/2019 PCP: [name], MD    Brief Narrative:  Vincent Clarke is a 54 y.o. male with medical history significant of hypertension, polysubstance abuse and stenosis of esophagus s/p stent placement presented to ED for evaluation of abdominal pain for the last 3 days.  Patient states that pain started 3 days ago and is located around his bellybutton, colicky in nature, 10 out of 10 on pain scale and radiates to his back.  Pain is also associated with nausea and vomiting.  Patient also admits of poor appetite secondary to nausea and vomiting.  Patient also has a PEG tube for supplementation but he is not sure if it is used for feeding.  Patient states that that he contacted his GI doctor because of severe pain and image showed that the stent was not in the esophagus so his gastroenterologist recommended him to go to the ED for evaluation of abdominal pain due to possible stent migration.  Patient denies fever, chills, chest pain, palpitations, shortness of breath, diarrhea, melena, blood in the stool and urinary frequency patient states that his last bowel movement was today and he also admits of passing flatus.  In ED on arrival patient had temperature of 98 degrees F, blood pressure 95/69, heart rate 87, respiratory rate 20 and oxygen saturation 96% on room air.  Blood work showed WBC count of 16 while rest of the blood work was within normal limits. CT Abd/Pelvis was done that showed dislodged esophageal stent that was seen in distal jejunum there was also evidence of small bowel obstruction but no evidence of bowel perforation.  The ED physician discussed the case with gastroenterologist on-call and he recommended to consult the surgery and start the patient on IV Zosyn.  Dr. Peter Minium was consulted who recommends that patient should be admitted and GI and surgery will follow up with the patient.   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  Patient underwent x-ray per his GI physician that was notable for dislodgment of his esophageal stent, in which he directed him to the ED for further evaluation.  Patient with elevated WBC count of 6.0, lactic acid 1.3, urinalysis unrevealing.  CT abdomen/pelvis notable for dislodged esophageal stent distal jejunum with resultant wall thickening and fat stranding with associated small bowel obstruction, and's of pneumatosis or perforation. --WBC 18.4-->16.0-->13.1-->13.3-->7.7 --General surgery and GI following, appreciate assistance --General surgery planning laparoscopic assisted removal of foreign body from the small intestine today --Continue n.p.o. --D5 LR at 75 mL's per hour --Continue Zosyn --Monitor CBC daily --Serial abdominal exams  Hypokalemia Potassium 3.3 today.  Etiology likely secondary to poor oral intake and n.p.o. status. --will replete --Repeat electrolytes in a.m. to include magnesium  Essential hypertension BP 117/64 this morning, controlled.  On lisinopril 20 mg p.o. daily at home, currently on hold. --Vasotec IV prn q6h for SBP > 170 or DBP > 110  Polysubstance abuse UDS positive for  amphetamines and opiates. --Continue home Suboxone 8 mg p.o. twice daily with hospital substitution  History of esophagitis --Pepcid 20 mg IV every 12 hours --Protonix 40 mg IV every 24 hours  Nicotine use disorder Counseled on need for cessation. --Nicotine patch  DVT prophylaxis: Lovenox Code Status: Full code Family Communication: Discussed with patient extensively at bedside Disposition Plan:      Patient is from: Home     Anticipated Disposition:  Home     Barriers to discharge or conditions that needs to be met prior to discharge: Resolution of small bowel obstruction, pending laparoscopic assisted removal of foreign body by general surgery today, sign off from general surgery and gastroenterology  Consultants:   General surgery - Dr. Johney Maine, Dr. Durwin Nora [facility]  Procedures:   Laparoscopic assisted removal of foreign body from the small intestine: Pending today  Antimicrobials:   Zosyn 2/24>>   Subjective: Patient seen and examined bedside, sleeping but easily arousable.  Continues with abdominal discomfort; slightly worse today and down in the low pelvis.  No other specific complaints at this time.  Denies headache, no fever/chills/night sweats, no chest pain, no palpitations, no shortness of breath.  No acute events overnight per nursing staff.  Objective: Vitals:   07/07/19 1453 07/07/19 2105 07/08/19 0602 07/08/19 0945  BP: 118/62 118/73 117/64 123/77  Pulse: 67 66 62 64  Resp: 14 18 16 16   Temp: 98.3 F (36.8 C) 98 F (36.7 C) 97.7 F (36.5 C) 98.1 F (36.7 C)  TempSrc: Oral Oral Oral Oral  SpO2: 97% 99% 98% 100%  Weight:      Height:        Intake/Output Summary (Last 24 hours) at 07/08/2019 1053 Last data filed at 07/08/2019 0600 Gross per 24 hour  Intake 3184.1 ml  Output 5200 ml  Net -2015.9 ml   Filed Weights   07/06/19 1730 07/07/19 0059  Weight: 74.8 kg 69.3 kg    Examination:  General exam: Appears calm and comfortable  Respiratory  system: Clear to auscultation. Respiratory effort normal.  Oxygenating well on room air Cardiovascular system: S1 & S2 heard, RRR. No JVD, murmurs, rubs, gallops or clicks. No pedal edema. Gastrointestinal system: Abdomen is nondistended, soft, mild low midline/bilateral lower quadrant tenderness.  Faint bowel sounds.  No masses/rebound/guarding, no appreciable organomegaly. Central nervous system: Alert and oriented. No focal neurological deficits. Extremities: Symmetric 5 x 5 power. Skin: No rashes, lesions or ulcers Psychiatry: Judgement and insight appear normal. Mood & affect appropriate.     Data Reviewed: I have personally reviewed following labs and imaging studies  CBC: Recent Labs  Lab 07/06/19 1346 07/06/19 1800 07/06/19 2347 07/07/19 0313 07/08/19 0326  WBC 18.4 Repeated and verified X2.* 16.0* 13.1* 13.3* 7.7  NEUTROABS 12.4* 10.8*  --   --   --   HGB 13.4 12.8* 12.5* 12.3* 11.2*  HCT 39.7 38.2* 37.1* 37.9* 34.2*  MCV 88.0 90.3 90.3 91.1 91.2  PLT 452.0* 411* 377 366 480   Basic Metabolic Panel: Recent Labs  Lab 07/06/19 1346 07/06/19 1800 07/06/19 2347 07/07/19 0313 07/08/19 0326  NA 135 136  --  140 144  K 4.5 4.0  --  4.1 3.3*  CL 101 104  --  107 108  CO2  27 24  --  25 25  GLUCOSE 106* 107*  --  102* 97  BUN 11 12  --  10 11  CREATININE 0.80 0.75 0.84 0.90 0.81  CALCIUM 9.4 8.7*  --  8.9 8.6*   GFR: Estimated Creatinine Clearance: 94.1 mL/min (by C-G formula based on SCr of 0.81 mg/dL). Liver Function Tests: Recent Labs  Lab 07/06/19 1346 07/06/19 1800 07/07/19 0313  AST 10 13* 11*  ALT 8 11 12   ALKPHOS 115 107 101  BILITOT 0.4 0.5 0.7  PROT 7.1 6.9 6.6  ALBUMIN 3.7 3.5 3.2*   Recent Labs  Lab 07/06/19 1346 07/06/19 1800  LIPASE 11.0 18  AMYLASE 25*  --    No results for input(s): AMMONIA in the last 168 hours. Coagulation Profile: No results for input(s): INR, PROTIME in the last 168 hours. Cardiac Enzymes: No results for  input(s): CKTOTAL, CKMB, CKMBINDEX, TROPONINI in the last 168 hours. BNP (last 3 results) No results for input(s): PROBNP in the last 8760 hours. HbA1C: No results for input(s): HGBA1C in the last 72 hours. CBG: No results for input(s): GLUCAP in the last 168 hours. Lipid Profile: No results for input(s): CHOL, HDL, LDLCALC, TRIG, CHOLHDL, LDLDIRECT in the last 72 hours. Thyroid Function Tests: No results for input(s): TSH, T4TOTAL, FREET4, T3FREE, THYROIDAB in the last 72 hours. Anemia Panel: No results for input(s): VITAMINB12, FOLATE, FERRITIN, TIBC, IRON, RETICCTPCT in the last 72 hours. Sepsis Labs: Recent Labs  Lab 07/06/19 1940  LATICACIDVEN 1.3    Recent Results (from the past 240 hour(s))  Culture, blood (routine x 2)     Status: None (Preliminary result)   Collection Time: 07/06/19  7:40 PM   Specimen: BLOOD  Result Value Ref Range Status   Specimen Description BLOOD RIGHT ANTECUBITAL  Final   Special Requests   Final    BOTTLES DRAWN AEROBIC AND ANAEROBIC Blood Culture adequate volume Performed at [facility] [address], [place], [place] [address]    Culture NO GROWTH < 24 HOURS  Final   Report Status PENDING  Incomplete  Culture, blood (routine x 2)     Status: None (Preliminary result)   Collection Time: 07/06/19  7:41 PM   Specimen: BLOOD  Result Value Ref Range Status   Specimen Description BLOOD LEFT ANTECUBITAL  Final   Special Requests   Final    BOTTLES DRAWN AEROBIC AND ANAEROBIC Blood Culture adequate volume Performed at [facility] [address] [place], [place] [address]    Culture NO GROWTH < 24 HOURS  Final   Report Status PENDING  Incomplete  Respiratory Panel by RT PCR (Flu A&B, Covid) - Nasopharyngeal Swab     Status: None   Collection Time: 07/06/19  8:39 PM   Specimen: Nasopharyngeal Swab  Result Value Ref Range Status   SARS Coronavirus 2 by RT PCR NEGATIVE NEGATIVE Final    Comment:  (NOTE) SARS-CoV-2 target nucleic acids are NOT DETECTED. The SARS-CoV-2 RNA is generally detectable in upper respiratoy specimens during the acute phase of infection. The lowest concentration of SARS-CoV-2 viral copies this assay can detect is 131 copies/mL. A negative result does not preclude SARS-Cov-2 infection and should not be used as the sole basis for treatment or other patient management decisions. A negative result may occur with  improper specimen collection/handling, submission of specimen other than nasopharyngeal swab, presence of viral mutation(s) within the areas targeted by this assay, and inadequate number of viral  copies (<131 copies/mL). A negative result must be combined with clinical observations, patient history, and epidemiological information. The expected result is Negative. Fact Sheet for Patients:  PinkCheek.be Fact Sheet for Healthcare Providers:  GravelBags.it This test is not yet ap proved or cleared by the [place] FDA and  has been authorized for detection and/or diagnosis of SARS-CoV-2 by FDA under an Emergency Use Authorization (EUA). This EUA will remain  in effect (meaning this test can be used) for the duration of the COVID-19 declaration under Section 564(b)(1) of the Act, 21 U.S.C. section 360bbb-3(b)(1), unless the authorization is terminated or revoked sooner.    Influenza A by PCR NEGATIVE NEGATIVE Final   Influenza B by PCR NEGATIVE NEGATIVE Final    Comment: (NOTE) The Xpert Xpress SARS-CoV-2/FLU/RSV assay is intended as an aid in  the diagnosis of influenza from Nasopharyngeal swab specimens and  should not be used as a sole basis for treatment. Nasal washings and  aspirates are unacceptable for Xpert Xpress SARS-CoV-2/FLU/RSV  testing. Fact Sheet for Patients: PinkCheek.be Fact Sheet for Healthcare  Providers: GravelBags.it This test is not yet approved or cleared by the [place] FDA and  has been authorized for detection and/or diagnosis of SARS-CoV-2 by  FDA under an Emergency Use Authorization (EUA). This EUA will remain  in effect (meaning this test can be used) for the duration of the  Covid-19 declaration under Section 564(b)(1) of the Act, 21  U.S.C. section 360bbb-3(b)(1), unless the authorization is  terminated or revoked. Performed at [facility], [place] [address], [place],  [address]          Radiology Studies: DG Chest 2 View  Result Date: 07/06/2019 CLINICAL DATA:  Jewel stent placement EXAM: CHEST - 2 VIEW COMPARISON:  January 22, 2019. FINDINGS: Esophageal stent is not present within the chest. Lungs are clear. Heart size and pulmonary vascular normal. No adenopathy. No pneumomediastinum or pneumothorax. There is aortic atherosclerosis. There is mild degenerative change in the thoracic spine. IMPRESSION: Stent is not present in the chest. No pneumomediastinum or pneumothorax. Lungs clear. Cardiac silhouette normal. Aortic Atherosclerosis (ICD10-I70.0). Electronically Signed   By: Lowella Grip III M.D.   On: 07/06/2019 16:31   CT ABDOMEN PELVIS W CONTRAST  Result Date: 07/06/2019 CLINICAL DATA:  Mild positioning of esophageal stent on abdominal x-ray performed earlier today, abdominal pain below gastrostomy tube EXAM: CT ABDOMEN AND PELVIS WITH CONTRAST TECHNIQUE: Multidetector CT imaging of the abdomen and pelvis was performed using the standard protocol following bolus administration of intravenous contrast. CONTRAST:  180m OMNIPAQUE IOHEXOL 300 MG/ML  SOLN COMPARISON:  07/06/2019, 03/03/2019 FINDINGS: Lower chest: No acute pleural or parenchymal lung disease. Hepatobiliary: No focal liver abnormality is seen. Status post cholecystectomy. No biliary dilatation. Pancreas: Unremarkable. No pancreatic ductal  dilatation or surrounding inflammatory changes. Spleen: Normal in size without focal abnormality. Adrenals/Urinary Tract: Adrenal glands are unremarkable. Kidneys are normal, without renal calculi, focal lesion, or hydronephrosis. Bladder is unremarkable. Stomach/Bowel: The dislodged esophageal stent is seen on abdominal x-ray earlier today is identified on this exam within the mid to distal jejunum. Proximal small bowel dilatation consistent with obstruction as result of the dislodged stent. There is bowel wall thickening and surrounding mesenteric fat stranding at the site of obstruction within the distal jejunum lower pelvis. No evidence of pneumatosis or bowel perforation at this time. Percutaneous gastrostomy tube is seen within the gastric lumen. No complications related to the gastrostomy tube site. Vascular/Lymphatic: Aortic atherosclerosis. No enlarged  abdominal or pelvic lymph nodes. Reproductive: Prostate is unremarkable. Other: Mild mesenteric edema within the lower pelvis. No free fluid or free gas. Musculoskeletal: No acute or destructive bony lesions. Reconstructed images demonstrate no additional findings. IMPRESSION: 1. Dislodged esophageal stent, seen within the distal jejunum. There is resulting wall thickening, surrounding fat stranding, and small bowel obstruction. Surgical consultation recommended. 2. Unremarkable percutaneous gastrostomy tube. Electronically Signed   By: Randa Ngo M.D.   On: 07/06/2019 19:23   DG Abd 2 Views  Result Date: 07/06/2019 CLINICAL DATA:  Migrated esophageal stent EXAM: ABDOMEN - 2 VIEW COMPARISON:  None. FINDINGS: Esophageal stent is in the pelvis, either in a loop of distal small bowel or in the sigmoid colon. There is no free air. There are loops of borderline dilated small bowel with multiple air-fluid levels. There are surgical clips in the left upper abdomen. There is an apparent gastrostomy catheter in the stomach region. IMPRESSION: Esophageal stent  has migrated into the pelvis, either within a loop of distal small bowel or sigmoid colon. Borderline dilatation of several loops of small bowel with multiple air-fluid levels. Question a degree of enteritis versus early bowel obstruction. A degree of ileus could present similarly. No evident free air. Gastrostomy catheter in stomach region. With respect to location of the stent, correlation with CT may be helpful to assess for small bowel versus large bowel location. These results were called by telephone at the time of interpretation on 07/06/2019 at 4:30 pm to provider [facility] , who verbally acknowledged these results. Electronically Signed   By: Lowella Grip III M.D.   On: 07/06/2019 16:30   DG Abd Portable 1V  Result Date: 07/08/2019 CLINICAL DATA:  Migrated esophageal stent EXAM: PORTABLE ABDOMEN - 1 VIEW COMPARISON:  Radiograph 07/06/2019, CT 07/06/2019 FINDINGS: A an esophageal stent projects over the right hemipelvis in a similar location to that which was seen on the comparison CT and radiograph 1 day prior. Multiple air-filled loops of bowel in the abdomen are similar to the comparison exam. These loops are at the upper limits of normal for size but without high-grade obstructive pattern at this time. Surgical clips present in the right upper quadrant. Percutaneous gastrostomy tube projects over the upper abdomen. IMPRESSION: Stable position of the migrated esophageal stent projecting over the pelvis. Scattered air-filled loops of bowel within the abdomen but without convincing features of high-grade obstruction. Electronically Signed   By: Lovena Le M.D.   On: 07/08/2019 06:52        Scheduled Meds: . bisacodyl  10 mg Rectal Daily  . buprenorphine-naloxone  1 tablet Sublingual BID  . enoxaparin (LOVENOX) injection  40 mg Subcutaneous QHS  . lip balm  1 application Topical BID  . nicotine  21 mg Transdermal Daily  . pantoprazole (PROTONIX) IV  40 mg Intravenous QHS    Continuous Infusions: . sodium chloride Stopped (07/07/19 0150)  . dextrose 5% lactated ringers 75 mL/hr at 07/08/19 0815  . famotidine (PEPCID) IV 20 mg (07/08/19 1003)  . lactated ringers    . lactated ringers    . methocarbamol (ROBAXIN) IV Stopped (07/07/19 1933)  . ondansetron (ZOFRAN) IV    . piperacillin-tazobactam (ZOSYN)  IV 3.375 g (07/08/19 0627)  . potassium chloride 10 mEq (07/08/19 0957)     LOS: 2 days    Time spent: 36 minutes spent on chart review, discussion with nursing staff, consultants, updating family and interview/physical exam; more than 50% of that time was spent  in counseling and/or coordination of care.    Vincent Mcquiston J [place] Vincent Clarke [facility] Available via Epic secure chat 7am-7pm After these hours, please refer to coverage provider listed on [email or web address] 07/08/2019, 10:53 AM

## 2019-07-08 NOTE — Progress Notes (Signed)
Patient ID: Vincent Clarke, male   DOB: 04-16-1966, 54 y.o.   MRN: [id number]    Progress Note   Subjective  Day #3 CC; abdominal pain/esophageal stent migration with obstruction  WBC 7.7/hemoglobin 11.2 K+ 3.3  KUB today-graded esophageal stent in same location, multiple air-filled loops of bowel but no high-grade obstruction  Patient has been having worsening pain, says he was unable to sleep last night due to pain, and the pain meds are not working.  No vomiting, no bowel movement     Objective   Vital signs in last 24 hours: Temp:  [97.7 F (36.5 C)-98.3 F (36.8 C)] 97.7 F (36.5 C) (02/26 0602) Pulse Rate:  [62-67] 62 (02/26 0602) Resp:  [14-18] 16 (02/26 0602) BP: (117-118)/(62-73) 117/64 (02/26 0602) SpO2:  [97 %-99 %] 98 % (02/26 0602) Last BM Date: 07/07/19 General:   White male in NAD, uncomfortable appearing Heart:  Regular rate and rhythm; no murmurs Lungs: Respirations even and unlabored, lungs CTA bilaterally Abdomen:  Soft, tender across the lower abdomen with some guarding,. Normal bowel sounds. Extremities:  Without edema. Neurologic:  Alert and oriented,  grossly normal neurologically. Psych:  Cooperative. Normal mood and affect.  Intake/Output from previous day: 02/25 0701 - 02/26 0700 In: 2769.2 [P.O.:1464; I.V.:955.2; IV Piggyback:350.1] Out: 5400 [Urine:800; Drains:4600] Intake/Output this shift: No intake/output data recorded.  Lab Results: Recent Labs    07/06/19 2347 07/07/19 0313 07/08/19 0326  WBC 13.1* 13.3* 7.7  HGB 12.5* 12.3* 11.2*  HCT 37.1* 37.9* 34.2*  PLT 377 366 332   BMET Recent Labs    07/06/19 1800 07/06/19 1800 07/06/19 2347 07/07/19 0313 07/08/19 0326  NA 136  --   --  140 144  K 4.0  --   --  4.1 3.3*  CL 104  --   --  107 108  CO2 24  --   --  25 25  GLUCOSE 107*  --   --  102* 97  BUN 12  --   --  10 11  CREATININE 0.75   < > 0.84 0.90 0.81  CALCIUM 8.7*  --   --  8.9 8.6*   < > = values in this interval  not displayed.   LFT Recent Labs    07/07/19 0313  PROT 6.6  ALBUMIN 3.2*  AST 11*  ALT 12  ALKPHOS 101  BILITOT 0.7   PT/INR No results for input(s): LABPROT, INR in the last 72 hours.  Studies/Results: DG Chest 2 View  Result Date: 07/06/2019 CLINICAL DATA:  Jewel stent placement EXAM: CHEST - 2 VIEW COMPARISON:  January 22, 2019. FINDINGS: Esophageal stent is not present within the chest. Lungs are clear. Heart size and pulmonary vascular normal. No adenopathy. No pneumomediastinum or pneumothorax. There is aortic atherosclerosis. There is mild degenerative change in the thoracic spine. IMPRESSION: Stent is not present in the chest. No pneumomediastinum or pneumothorax. Lungs clear. Cardiac silhouette normal. Aortic Atherosclerosis (ICD10-I70.0). Electronically Signed   By: Bretta Bang III M.D.   On: 07/06/2019 16:31   CT ABDOMEN PELVIS W CONTRAST  Result Date: 07/06/2019 CLINICAL DATA:  Mild positioning of esophageal stent on abdominal x-ray performed earlier today, abdominal pain below gastrostomy tube EXAM: CT ABDOMEN AND PELVIS WITH CONTRAST TECHNIQUE: Multidetector CT imaging of the abdomen and pelvis was performed using the standard protocol following bolus administration of intravenous contrast. CONTRAST:  OMNIPAQUE IOHEXOL 300 MG/ML  SOLN COMPARISON:  07/06/2019, 03/03/2019 FINDINGS: Lower chest: No acute  pleural or parenchymal lung disease. Hepatobiliary: No focal liver abnormality is seen. Status post cholecystectomy. No biliary dilatation. Pancreas: Unremarkable. No pancreatic ductal dilatation or surrounding inflammatory changes. Spleen: Normal in size without focal abnormality. Adrenals/Urinary Tract: Adrenal glands are unremarkable. Kidneys are normal, without renal calculi, focal lesion, or hydronephrosis. Bladder is unremarkable. Stomach/Bowel: The dislodged esophageal stent is seen on abdominal x-ray earlier today is identified on this exam within the mid to  distal jejunum. Proximal small bowel dilatation consistent with obstruction as result of the dislodged stent. There is bowel wall thickening and surrounding mesenteric fat stranding at the site of obstruction within the distal jejunum lower pelvis. No evidence of pneumatosis or bowel perforation at this time. Percutaneous gastrostomy tube is seen within the gastric lumen. No complications related to the gastrostomy tube site. Vascular/Lymphatic: Aortic atherosclerosis. No enlarged abdominal or pelvic lymph nodes. Reproductive: Prostate is unremarkable. Other: Mild mesenteric edema within the lower pelvis. No free fluid or free gas. Musculoskeletal: No acute or destructive bony lesions. Reconstructed images demonstrate no additional findings. IMPRESSION: 1. Dislodged esophageal stent, seen within the distal jejunum. There is resulting wall thickening, surrounding fat stranding, and small bowel obstruction. Surgical consultation recommended. 2. Unremarkable percutaneous gastrostomy tube. Electronically Signed   By: Sharlet Salina M.D.   On: 07/06/2019 19:23   DG Abd 2 Views  Result Date: 07/06/2019 CLINICAL DATA:  Migrated esophageal stent EXAM: ABDOMEN - 2 VIEW COMPARISON:  None. FINDINGS: Esophageal stent is in the pelvis, either in a loop of distal small bowel or in the sigmoid colon. There is no free air. There are loops of borderline dilated small bowel with multiple air-fluid levels. There are surgical clips in the left upper abdomen. There is an apparent gastrostomy catheter in the stomach region. IMPRESSION: Esophageal stent has migrated into the pelvis, either within a loop of distal small bowel or sigmoid colon. Borderline dilatation of several loops of small bowel with multiple air-fluid levels. Question a degree of enteritis versus early bowel obstruction. A degree of ileus could present similarly. No evident free air. Gastrostomy catheter in stomach region. With respect to location of the stent,  correlation with CT may be helpful to assess for small bowel versus large bowel location. These results were called by telephone at the time of interpretation on 07/06/2019 at 4:30 pm to provider [facility] , who verbally acknowledged these results. Electronically Signed   By: Bretta Bang III M.D.   On: 07/06/2019 16:30   DG Abd Portable 1V  Result Date: 07/08/2019 CLINICAL DATA:  Migrated esophageal stent EXAM: PORTABLE ABDOMEN - 1 VIEW COMPARISON:  Radiograph 07/06/2019, CT 07/06/2019 FINDINGS: A an esophageal stent projects over the right hemipelvis in a similar location to that which was seen on the comparison CT and radiograph 1 day prior. Multiple air-filled loops of bowel in the abdomen are similar to the comparison exam. These loops are at the upper limits of normal for size but without high-grade obstructive pattern at this time. Surgical clips present in the right upper quadrant. Percutaneous gastrostomy tube projects over the upper abdomen. IMPRESSION: Stable position of the migrated esophageal stent projecting over the pelvis. Scattered air-filled loops of bowel within the abdomen but without convincing features of high-grade obstruction. Electronically Signed   By: Kreg Shropshire M.D.   On: 07/08/2019 06:52       Assessment / Plan:    #44 54 year old male admitted with abdominal pain and found to have migrated esophageal stent in the distal  jejunum CT shows surrounding inflammatory changes.  Patient had been having pain for about 6 days prior to admission. He has not had any improvement since admission. Patient has been evaluated by surgery this morning and plan is for OR this afternoon.  #2 refractory esophageal stricture Will need to continue twice daily PPI on discharge Full liquid to very soft diet when discharged Will arrange for outpatient follow-up with Dr. Havery [name] regarding further management of his esophageal stricture.  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

## 2019-07-08 NOTE — TOC Initial Note (Signed)
Transition of Care [facility]) - Initial/Assessment Note    Patient Details  Name: Vincent Clarke MRN: [id number] Date of Birth: 1966/01/20  [facility]) CM/SW Contact:    Clearance Coots, LCSW Phone Number: 07/08/2019, 10:56 AM  Clinical Narrative:                 Patient has hx of IV drug use. Patient reports the last time he used was the past weekend. " I only used because I was in so much pain, I wanted to stop the pain." Patient reports, " I was clean for a month." Patient reports he goes to [facility] internal medicine for suboxone.  Patient reports he is not interested in treatment.   CSW discussed GCSTOP program-designed to prevent repeat overdose and to counsel persistent users to enter treatment or adopt evidence-based harm reduction practices. They provide syringe exchange, harm-reduction training, and community overdose response education.   Patient receptive to resources.   Expected Discharge Plan: Home/Self Care Barriers to Discharge: Continued Medical Work up   Patient Goals and CMS Choice     Choice offered to / list presented to : NA  Expected Discharge Plan and Services Expected Discharge Plan: Home/Self Care In-house Referral: Clinical Social Work     Living arrangements for the past 2 months: Single Family Home                                      Prior Living Arrangements/Services Living arrangements for the past 2 months: Single Family Home Lives with:: Spouse, Minor Children Patient language and need for interpreter reviewed:: No Do you feel safe going back to the place where you live?: Yes      Need for Family Participation in Patient Care: No (Comment) Care giver support system in place?: Yes (comment)   Criminal Activity/Legal Involvement Pertinent to Current Situation/Hospitalization: No - Comment as needed  Activities of Daily Living Home Assistive Devices/Equipment: None ADL Screening (condition at time of  admission) Patient's cognitive ability adequate to safely complete daily activities?: Yes Is the patient deaf or have difficulty hearing?: No Does the patient have difficulty seeing, even when wearing glasses/contacts?: No Does the patient have difficulty concentrating, remembering, or making decisions?: No Patient able to express need for assistance with ADLs?: Yes Does the patient have difficulty dressing or bathing?: No Independently performs ADLs?: Yes (appropriate for developmental age) Does the patient have difficulty walking or climbing stairs?: No Weakness of Legs: None Weakness of Arms/Hands: None  Permission Sought/Granted                  Emotional Assessment Appearance:: Appears stated age Attitude/Demeanor/Rapport: Engaged Affect (typically observed): Accepting, Pleasant Orientation: : Oriented to Self, Oriented to Place, Oriented to  Time, Oriented to Situation Alcohol / Substance Use: Illicit Drugs Psych Involvement: No (comment)  Admission diagnosis:  SBO (small bowel obstruction) (HCC) [K56.609] Abdominal pain [R10.9] Patient Active Problem List   Diagnosis Date Noted  . 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 Hiatal hernia 07/06/2019  . Barrett's esophagus 07/06/2019  . Abdominal pain 07/06/2019  . Asthma   . Hypokalemia 04/13/2019  . 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 SBO (small bowel obstruction) (HCC) 10/12/2018  . S/P percutaneous endoscopic gastrostomy (PEG) tube placement 11/2018 07/06/2018  . Esophageal stricture  s/p diliations/stenting   . Esophagitis, erosive 06/05/2018  . Opioid use disorder, moderate, in early remission ([place])   . Peripheral vascular disease ([place]) 12/2014  . HTN (hypertension) 03/21/2014  . GERD (gastroesophageal reflux disease) 03/21/2014  . Tobacco use disorder 03/21/2014   PCP:  [name], MD Pharmacy:    [place], [place] - [address] [facility]. [address] [place] [address] Phone: [phone] Fax: [phone]     Social Determinants of Health (SDOH) Interventions    Readmission Risk Interventions No flowsheet data found.

## 2019-07-08 NOTE — Anesthesia Preprocedure Evaluation (Signed)
Anesthesia Evaluation  Patient identified by MRN, date of birth, ID band Patient awake    Reviewed: Allergy & Precautions, NPO status , Patient's Chart, lab work & pertinent test results  History of Anesthesia Complications Negative for: history of anesthetic complications  Airway Mallampati: II  TM Distance: >3 FB Neck ROM: Full    Dental  (+) Edentulous Upper, Edentulous Lower   Pulmonary asthma , Current Smoker and Patient abstained from smoking.,    Pulmonary exam normal        Cardiovascular hypertension, Pt. on medications + Peripheral Vascular Disease  Normal cardiovascular exam     Neuro/Psych PSYCHIATRIC DISORDERS Anxiety negative neurological ROS     GI/Hepatic PUD, GERD  Medicated and Controlled,(+)     substance abuse (on suboxone)  IV drug use,  Esophageal stricture Hx PEG tube    Endo/Other  negative endocrine ROS  Renal/GU negative Renal ROS     Musculoskeletal  (+) Arthritis , narcotic dependent  Abdominal   Peds  Hematology negative hematology ROS (+)   Anesthesia Other Findings Covid neg 1/14   Reproductive/Obstetrics                             Anesthesia Physical  Anesthesia Plan  ASA: III  Anesthesia Plan: General   Post-op Pain Management:    Induction: Intravenous  PONV Risk Score and Plan: 3 and Treatment may vary due to age or medical condition, Ondansetron, Dexamethasone and Midazolam  Airway Management Planned: Oral ETT  Additional Equipment: None  Intra-op Plan:   Post-operative Plan: Extubation in OR  Informed Consent: I have reviewed the patients History and Physical, chart, labs and discussed the procedure including the risks, benefits and alternatives for the proposed anesthesia with the patient or authorized representative who has indicated his/her understanding and acceptance.     Dental advisory given  Plan Discussed with:    Anesthesia Plan Comments:         Anesthesia Quick Evaluation

## 2019-07-08 NOTE — Anesthesia Procedure Notes (Signed)
Procedure Name: Intubation Date/Time: 07/08/2019 4:21 PM Performed by: Anne Fu, CRNA Pre-anesthesia Checklist: Patient identified, Emergency Drugs available, Suction available, Patient being monitored and Timeout performed Patient Re-evaluated:Patient Re-evaluated prior to induction Oxygen Delivery Method: Circle system utilized Preoxygenation: Pre-oxygenation with 100% oxygen Induction Type: IV induction Ventilation: Mask ventilation without difficulty Laryngoscope Size: Mac and 4 Grade View: Grade I Tube type: Oral Tube size: 7.5 mm Number of attempts: 1 Airway Equipment and Method: Stylet Placement Confirmation: ETT inserted through vocal cords under direct vision,  positive ETCO2 and breath sounds checked- equal and bilateral Secured at: 22 cm Tube secured with: Tape Dental Injury: Teeth and Oropharynx as per pre-operative assessment

## 2019-07-08 NOTE — Transfer of Care (Signed)
Immediate Anesthesia Transfer of Care Note  Patient: RENN DIROCCO  Procedure(s) Performed: Procedure(s): LAPAROSCOPIC ASSISTED REMOVAL FOREIGN BODY FROM SMALL INTESTINE/SMALL BOWEL RESECTION (N/A)  Patient Location: PACU  Anesthesia Type:General  Level of Consciousness:  sedated, patient cooperative and responds to stimulation  Airway & Oxygen Therapy:Patient Spontanous Breathing and Patient connected to face mask oxgen  Post-op Assessment:  Report given to PACU RN and Post -op Vital signs reviewed and stable  Post vital signs:  Reviewed and stable  Last Vitals:  Vitals:   07/08/19 1349 07/08/19 1804  BP: (!) 146/86 (P) 139/86  Pulse: 76   Resp: 16 (P) 12  Temp: 37 C (!) (P) 36.4 C  SpO2: 99%     Complications: No apparent anesthesia complications

## 2019-07-08 NOTE — Plan of Care (Signed)
  Problem: Health Behavior/Discharge Planning: Goal: Ability to manage health-related needs will improve Outcome: Progressing   Problem: Education: Goal: Knowledge of General Education information will improve Description: Including pain rating scale, medication(s)/side effects and non-pharmacologic comfort measures Outcome: Progressing   Problem: Clinical Measurements: Goal: Ability to maintain clinical measurements within normal limits will improve Outcome: Progressing Goal: Will remain free from infection Outcome: Progressing Goal: Diagnostic test results will improve Outcome: Progressing Goal: Cardiovascular complication will be avoided Outcome: Progressing   Problem: Activity: Goal: Risk for activity intolerance will decrease Outcome: Progressing   Problem: Nutrition: Goal: Adequate nutrition will be maintained Outcome: Progressing   Problem: Coping: Goal: Level of anxiety will decrease Outcome: Progressing   Problem: Elimination: Goal: Will not experience complications related to bowel motility Outcome: Progressing Goal: Will not experience complications related to urinary retention Outcome: Progressing   Problem: Pain Managment: Goal: General experience of comfort will improve Outcome: Progressing   Problem: Safety: Goal: Ability to remain free from injury will improve Outcome: Progressing   Problem: Skin Integrity: Goal: Risk for impaired skin integrity will decrease Outcome: Progressing

## 2019-07-09 LAB — COMPREHENSIVE METABOLIC PANEL
ALT: 11 U/L (ref 0–44)
AST: 12 U/L — ABNORMAL LOW (ref 15–41)
Albumin: 2.8 g/dL — ABNORMAL LOW (ref 3.5–5.0)
Alkaline Phosphatase: 76 U/L (ref 38–126)
Anion gap: 8 (ref 5–15)
BUN: 12 mg/dL (ref 6–20)
CO2: 21 mmol/L — ABNORMAL LOW (ref 22–32)
Calcium: 8.3 mg/dL — ABNORMAL LOW (ref 8.9–10.3)
Chloride: 112 mmol/L — ABNORMAL HIGH (ref 98–111)
Creatinine, Ser: 0.8 mg/dL (ref 0.61–1.24)
GFR calc Af Amer: >60 mL/min (ref >60)
GFR calc non Af Amer: >60 mL/min (ref >60)
Glucose, Bld: 108 mg/dL — ABNORMAL HIGH (ref 70–99)
Potassium: 3.8 mmol/L (ref 3.5–5.1)
Sodium: 141 mmol/L (ref 135–145)
Total Bilirubin: 0.5 mg/dL (ref 0.3–1.2)
Total Protein: 6 g/dL — ABNORMAL LOW (ref 6.5–8.1)

## 2019-07-09 LAB — BASIC METABOLIC PANEL
Anion gap: 8 (ref 5–15)
BUN: 12 mg/dL (ref 6–20)
CO2: 22 mmol/L (ref 22–32)
Calcium: 8.6 mg/dL — ABNORMAL LOW (ref 8.9–10.3)
Chloride: 112 mmol/L — ABNORMAL HIGH (ref 98–111)
Creatinine, Ser: 0.86 mg/dL (ref 0.61–1.24)
GFR calc Af Amer: >60 mL/min (ref >60)
GFR calc non Af Amer: >60 mL/min (ref >60)
Glucose, Bld: 142 mg/dL — ABNORMAL HIGH (ref 70–99)
Potassium: 4.3 mmol/L (ref 3.5–5.1)
Sodium: 142 mmol/L (ref 135–145)

## 2019-07-09 LAB — CBC
HCT: 35.4 % — ABNORMAL LOW (ref 39.0–52.0)
Hemoglobin: 11.9 g/dL — ABNORMAL LOW (ref 13.0–17.0)
MCH: 30.4 pg (ref 26.0–34.0)
MCHC: 33.6 g/dL (ref 30.0–36.0)
MCV: 90.5 fL (ref 80.0–100.0)
Platelets: 356 10*3/uL (ref 150–400)
RBC: 3.91 MIL/uL — ABNORMAL LOW (ref 4.22–5.81)
RDW: 12.5 % (ref 11.5–15.5)
WBC: 16.5 10*3/uL — ABNORMAL HIGH (ref 4.0–10.5)
nRBC: 0 % (ref 0.0–0.2)

## 2019-07-09 LAB — MAGNESIUM: Magnesium: 1.9 mg/dL (ref 1.7–2.4)

## 2019-07-09 MED ORDER — KETOROLAC TROMETHAMINE 30 MG/ML IJ SOLN
30.0000 mg | Freq: Four times a day (QID) | INTRAMUSCULAR | Status: AC | PRN
  Administered 2019-07-10 – 2019-07-11 (×4): 30 mg via INTRAVENOUS
  Filled 2019-07-09 (×4): qty 1

## 2019-07-09 MED ORDER — CHLORHEXIDINE GLUCONATE CLOTH 2 % EX PADS
6.0000 | MEDICATED_PAD | Freq: Every day | CUTANEOUS | Status: DC
  Administered 2019-07-09 – 2019-07-13 (×5): 6 via TOPICAL

## 2019-07-09 MED ORDER — MORPHINE SULFATE (PF) 2 MG/ML IV SOLN
1.0000 mg | INTRAVENOUS | Status: DC | PRN
  Administered 2019-07-09 – 2019-07-12 (×16): 2 mg via INTRAVENOUS
  Filled 2019-07-09 (×16): qty 1

## 2019-07-09 NOTE — Progress Notes (Signed)
1 Day Post-Op   Subjective/Chief Complaint: sore Sore no vomiting    Objective: Vital signs in last 24 hours: Temp:  [97.5 F (36.4 C)-99 F (37.2 C)] 99 F (37.2 C) (02/27 0500) Pulse Rate:  [59-76] 59 (02/27 0500) Resp:  [11-16] 16 (02/27 0500) BP: (138-168)/(82-94) 147/84 (02/27 0500) SpO2:  [94 %-100 %] 96 % (02/27 0500) Last BM Date: 07/07/19  Intake/Output from previous day: 02/26 0701 - 02/27 0700 In: 2952.8 [I.V.:2328; IV Piggyback:624.8] Out: 1750 [Urine:1100; Drains:630; Blood:20] Intake/Output this shift: No intake/output data recorded.  Incision/Wound:dressing dry sore thought abdomen  Slight distention   Lab Results:  Recent Labs    07/08/19 0326 07/09/19 0400  WBC 7.7 16.5*  HGB 11.2* 11.9*  HCT 34.2* 35.4*  PLT 332 356   BMET Recent Labs    07/08/19 0326 07/09/19 0400  NA 144 142  K 3.3* 4.3  CL 108 112*  CO2 25 22  GLUCOSE 97 142*  BUN 11 12  CREATININE 0.81 0.86  CALCIUM 8.6* 8.6*   PT/INR No results for input(s): LABPROT, INR in the last 72 hours. ABG No results for input(s): PHART, HCO3 in the last 72 hours.  Invalid input(s): PCO2, PO2  Studies/Results: DG Abd Portable 1V  Result Date: 07/08/2019 CLINICAL DATA:  Migrated esophageal stent EXAM: PORTABLE ABDOMEN - 1 VIEW COMPARISON:  Radiograph 07/06/2019, CT 07/06/2019 FINDINGS: A an esophageal stent projects over the right hemipelvis in a similar location to that which was seen on the comparison CT and radiograph 1 day prior. Multiple air-filled loops of bowel in the abdomen are similar to the comparison exam. These loops are at the upper limits of normal for size but without high-grade obstructive pattern at this time. Surgical clips present in the right upper quadrant. Percutaneous gastrostomy tube projects over the upper abdomen. IMPRESSION: Stable position of the migrated esophageal stent projecting over the pelvis. Scattered air-filled loops of bowel within the abdomen but without  convincing features of high-grade obstruction. Electronically Signed   By: Kreg Shropshire M.D.   On: 07/08/2019 06:52    Anti-infectives: Anti-infectives (From admission, onward)   Start     Dose/Rate Route Frequency Ordered Stop   07/07/19 0100  piperacillin-tazobactam (ZOSYN) IVPB 3.375 g     3.375 g 12.5 mL/hr over 240 Minutes Intravenous Every 8 hours 07/06/19 2318     07/06/19 2015  piperacillin-tazobactam (ZOSYN) IVPB 3.375 g     3.375 g 100 mL/hr over 30 Minutes Intravenous  Once 07/06/19 2006 07/06/19 2204      Assessment/Plan: s/p ex lap for perforated SB from migrated stent   ICE CHIPS  OOB  DVT prevention - lovanox   Foley- SD Needs cysto next week prior to puling since stent was embedded in the bladder but no overt perforation    LOS: 3 days    Vincent Clarke 07/09/2019

## 2019-07-09 NOTE — Progress Notes (Signed)
[facility]                                                                              Patient Demographics  Vincent Clarke, is a 54 y.o. male, DOB - [birth year], HUD:[id number]  Admit date - 07/06/2019   Admitting Physician Edmonia Lynch, DO  Outpatient Primary MD for the patient is [name], MD  Outpatient specialists:   LOS - 3  days   Medical records reviewed and are as summarized below:    Chief Complaint  Patient presents with  . 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  Pain also associated with nausea and vomiting, poor appetite.  Patient also has PEG tube for supplementation but not sure if it was used for feeding.  Patient contacted his GI physician because of severe pain and imaging showed that stent was not in esophagus and was recommended to go to ED.  CT abdomen pelvis showed dislodged esophageal stent that was seen in distal jejunum, evidence of small bowel obstruction but no perforation.  Patient was admitted for further management, GI and general surgery were consulted. Status post laparoscopic removal of the foreign body from small intestine, open small bowel resection.   Assessment & Plan    Principal Problem: Abdominal pain, secondary to SBO from esophageal stent migration -Patient presented with progressive abdominal pain, nausea vomiting.  Imaging confirmed dislodgment of his esophageal stent and was directed to ED. -CT abdomen pelvis showed dislodged esophageal stent and distal jejunum with resultant wall thickening and fat stranding with associated SBO. -General surgery, GI consulted. -Underwent laparoscopic assisted removal of the foreign body, postop day #1 -Starting on sips of clears, continue IV fluids, continue IV Zosyn -WBC  count worsened to 16.5, no fevers, no worsening symptoms, ordered blood cultures.  Will follow general surgery recommendations.  Hypokalemia Resolved, 4.3 today  Essential hypertension -BP stable, cont vasotec IV prn with parameters   Polysubstance abuse - UDS + amphetamines and opiates -Continue Suboxone  History of esophagitis -Continue Pepcid, Protonix  Nicotine use Continue nicotine patch   Code Status: full  DVT Prophylaxis:  Lovenox  Family Communication: Discussed all imaging results, lab results, explained to the patient    Disposition Plan: Patient from home, anticipated discharge to home once resolution of the SBO and patient tolerating diet, cleared by general surgery.  Postop day #1, still n.p.o.   Time Spent in minutes 35 minutes  Procedures:  Laparoscopic assisted removal of foreign body from the small intestine  Consultants:   General surgery GI  Antimicrobials:   Anti-infectives (From admission, onward)   Start     Dose/Rate Route Frequency Ordered Stop   07/07/19 0100  piperacillin-tazobactam (ZOSYN) IVPB 3.375 g     3.375 g 12.5 mL/hr over 240 Minutes Intravenous Every 8 hours 07/06/19 2318     07/06/19 2015  piperacillin-tazobactam (ZOSYN) IVPB 3.375 g     3.375 g 100 mL/hr over 30  Minutes Intravenous  Once 07/06/19 2006 07/06/19 2204          Medications  Scheduled Meds: . bisacodyl  10 mg Rectal Daily  . buprenorphine-naloxone  1 tablet Sublingual BID  . Chlorhexidine Gluconate Cloth  6 each Topical Daily  . enoxaparin (LOVENOX) injection  40 mg Subcutaneous QHS  . lip balm  1 application Topical BID  . nicotine  21 mg Transdermal Daily  . pantoprazole (PROTONIX) IV  40 mg Intravenous QHS   Continuous Infusions: . dextrose 5 % and 0.9 % NaCl with KCl 20 mEq/L 100 mL/hr at 07/08/19 2149  . famotidine (PEPCID) IV 20 mg (07/09/19 0857)  . lactated ringers 50 mL/hr at 07/08/19 1345  . methocarbamol (ROBAXIN) IV 1,000 mg (07/09/19  0411)  . ondansetron (ZOFRAN) IV    . piperacillin-tazobactam (ZOSYN)  IV 3.375 g (07/09/19 0622)   PRN Meds:.acetaminophen, acetaminophen **OR** [DISCONTINUED] acetaminophen, diphenhydrAMINE, enalaprilat, ketorolac, magic mouthwash, methocarbamol (ROBAXIN) IV, metoprolol tartrate, ondansetron (ZOFRAN) IV **OR** ondansetron (ZOFRAN) IV, prochlorperazine      Subjective:   Vincent Clarke was seen and examined today.  No acute events overnight, feeling hungry, wants to eat or drink something.  No other specific complaints.  No acute events overnight.  No new fevers chills, worsening abdominal pain.  No headache, shortness of breath or chest pain.    Objective:   Vitals:   07/08/19 1915 07/08/19 1930 07/08/19 2012 07/09/19 0500  BP: (!) 153/87  (!) 168/82 (!) 147/84  Pulse: 63  65 (!) 59  Resp: 11  14 16   Temp:  98.3 F (36.8 C) 98.5 F (36.9 C) 99 F (37.2 C)  TempSrc:   Oral   SpO2: 96%  100% 96%  Weight:      Height:        Intake/Output Summary (Last 24 hours) at 07/09/2019 1019 Last data filed at 07/09/2019 5852 Gross per 24 hour  Intake 2584.1 ml  Output 1500 ml  Net 1084.1 ml     Wt Readings from Last 3 Encounters:  07/07/19 69.3 kg  05/30/19 73.9 kg  05/19/19 73.9 kg     Exam  General: Alert and oriented x 3, NAD, appears comfortable  Cardiovascular: S1 S2 auscultated, no murmurs, RRR  Respiratory: Clear to auscultation bilaterally, no wheezing, rales or rhonchi  Gastrointestinal: Soft, G-tube to gravity, hypoactive bowel sounds, midline TTP  Ext: no pedal edema bilaterally  Neuro: No new deficits  Musculoskeletal: No digital cyanosis, clubbing  Skin: No rashes  Psych: Normal affect and demeanor, alert and oriented x3    Data Reviewed:  I have personally reviewed following labs and imaging studies  Micro Results Recent Results (from the past 240 hour(s))  Culture, blood (routine x 2)     Status: None (Preliminary result)   Collection Time:  07/06/19  7:40 PM   Specimen: BLOOD  Result Value Ref Range Status   Specimen Description   Final    BLOOD RIGHT ANTECUBITAL Performed at [facility], [address] [place], [place] [address]    Special Requests   Final    BOTTLES DRAWN AEROBIC AND ANAEROBIC Blood Culture adequate volume Performed at [facility], [address] [place], [place] [address]    Culture   Final    NO GROWTH 3 DAYS Performed at [facility], [address], [place], [place] [address]    Report Status PENDING  Incomplete  Culture, blood (routine x 2)  Status: None (Preliminary result)   Collection Time: 07/06/19  7:41 PM   Specimen: BLOOD  Result Value Ref Range Status   Specimen Description   Final    BLOOD LEFT ANTECUBITAL Performed at [facility], [address], [place], [place] [address]    Special Requests   Final    BOTTLES DRAWN AEROBIC AND ANAEROBIC Blood Culture adequate volume Performed at [facility], [address] [place], [place] [address]    Culture   Final    NO GROWTH 3 DAYS Performed at [facility], [address], [place], [place] [address]    Report Status PENDING  Incomplete  Respiratory Panel by RT PCR (Flu A&B, Covid) - Nasopharyngeal Swab     Status: None   Collection Time: 07/06/19  8:39 PM   Specimen: Nasopharyngeal Swab  Result Value Ref Range Status   SARS Coronavirus 2 by RT PCR NEGATIVE NEGATIVE Final    Comment: (NOTE) SARS-CoV-2 target nucleic acids are NOT DETECTED. The SARS-CoV-2 RNA is generally detectable in upper respiratoy specimens during the acute phase of infection. The lowest concentration of SARS-CoV-2 viral copies this assay can detect is 131 copies/mL. A negative result does not preclude SARS-Cov-2 infection and should not be used as the sole basis for treatment or other patient management decisions. A negative result may occur with  improper specimen  collection/handling, submission of specimen other than nasopharyngeal swab, presence of viral mutation(s) within the areas targeted by this assay, and inadequate number of viral copies (<131 copies/mL). A negative result must be combined with clinical observations, patient history, and epidemiological information. The expected result is Negative. Fact Sheet for Patients:  https://www.moore.com/ Fact Sheet for Healthcare Providers:  https://www.young.biz/ This test is not yet ap proved or cleared by the [place] FDA and  has been authorized for detection and/or diagnosis of SARS-CoV-2 by FDA under an Emergency Use Authorization (EUA). This EUA will remain  in effect (meaning this test can be used) for the duration of the COVID-19 declaration under Section 564(b)(1) of the Act, 21 U.S.C. section 360bbb-3(b)(1), unless the authorization is terminated or revoked sooner.    Influenza A by PCR NEGATIVE NEGATIVE Final   Influenza B by PCR NEGATIVE NEGATIVE Final    Comment: (NOTE) The Xpert Xpress SARS-CoV-2/FLU/RSV assay is intended as an aid in  the diagnosis of influenza from Nasopharyngeal swab specimens and  should not be used as a sole basis for treatment. Nasal washings and  aspirates are unacceptable for Xpert Xpress SARS-CoV-2/FLU/RSV  testing. Fact Sheet for Patients: https://www.moore.com/ Fact Sheet for Healthcare Providers: https://www.young.biz/ This test is not yet approved or cleared by the [place] FDA and  has been authorized for detection and/or diagnosis of SARS-CoV-2 by  FDA under an Emergency Use Authorization (EUA). This EUA will remain  in effect (meaning this test can be used) for the duration of the  Covid-19 declaration under Section 564(b)(1) of the Act, 21  U.S.C. section 360bbb-3(b)(1), unless the authorization is  terminated or revoked. Performed at [facility], [address], [place], [place] [address]     Radiology Reports DG Chest 2 View  Result Date: 07/06/2019 CLINICAL DATA:  Jewel stent placement EXAM: CHEST - 2 VIEW COMPARISON:  January 22, 2019. FINDINGS: Esophageal stent is not present within the chest. Lungs are clear. Heart size and pulmonary vascular normal. No adenopathy. No pneumomediastinum or pneumothorax. There is aortic atherosclerosis. There is mild degenerative change  in the thoracic spine. IMPRESSION: Stent is not present in the chest. No pneumomediastinum or pneumothorax. Lungs clear. Cardiac silhouette normal. Aortic Atherosclerosis (ICD10-I70.0). Electronically Signed   By: Bretta Bang III M.D.   On: 07/06/2019 16:31   CT ABDOMEN PELVIS W CONTRAST  Result Date: 07/06/2019 CLINICAL DATA:  Mild positioning of esophageal stent on abdominal x-ray performed earlier today, abdominal pain below gastrostomy tube EXAM: CT ABDOMEN AND PELVIS WITH CONTRAST TECHNIQUE: Multidetector CT imaging of the abdomen and pelvis was performed using the standard protocol following bolus administration of intravenous contrast. CONTRAST:  OMNIPAQUE IOHEXOL 300 MG/ML  SOLN COMPARISON:  07/06/2019, 03/03/2019 FINDINGS: Lower chest: No acute pleural or parenchymal lung disease. Hepatobiliary: No focal liver abnormality is seen. Status post cholecystectomy. No biliary dilatation. Pancreas: Unremarkable. No pancreatic ductal dilatation or surrounding inflammatory changes. Spleen: Normal in size without focal abnormality. Adrenals/Urinary Tract: Adrenal glands are unremarkable. Kidneys are normal, without renal calculi, focal lesion, or hydronephrosis. Bladder is unremarkable. Stomach/Bowel: The dislodged esophageal stent is seen on abdominal x-ray earlier today is identified on this exam within the mid to distal jejunum. Proximal small bowel dilatation consistent with obstruction as result of the dislodged stent. There is bowel wall thickening  and surrounding mesenteric fat stranding at the site of obstruction within the distal jejunum lower pelvis. No evidence of pneumatosis or bowel perforation at this time. Percutaneous gastrostomy tube is seen within the gastric lumen. No complications related to the gastrostomy tube site. Vascular/Lymphatic: Aortic atherosclerosis. No enlarged abdominal or pelvic lymph nodes. Reproductive: Prostate is unremarkable. Other: Mild mesenteric edema within the lower pelvis. No free fluid or free gas. Musculoskeletal: No acute or destructive bony lesions. Reconstructed images demonstrate no additional findings. IMPRESSION: 1. Dislodged esophageal stent, seen within the distal jejunum. There is resulting wall thickening, surrounding fat stranding, and small bowel obstruction. Surgical consultation recommended. 2. Unremarkable percutaneous gastrostomy tube. Electronically Signed   By: Sharlet Salina M.D.   On: 07/06/2019 19:23   DG Abd 2 Views  Result Date: 07/06/2019 CLINICAL DATA:  Migrated esophageal stent EXAM: ABDOMEN - 2 VIEW COMPARISON:  None. FINDINGS: Esophageal stent is in the pelvis, either in a loop of distal small bowel or in the sigmoid colon. There is no free air. There are loops of borderline dilated small bowel with multiple air-fluid levels. There are surgical clips in the left upper abdomen. There is an apparent gastrostomy catheter in the stomach region. IMPRESSION: Esophageal stent has migrated into the pelvis, either within a loop of distal small bowel or sigmoid colon. Borderline dilatation of several loops of small bowel with multiple air-fluid levels. Question a degree of enteritis versus early bowel obstruction. A degree of ileus could present similarly. No evident free air. Gastrostomy catheter in stomach region. With respect to location of the stent, correlation with CT may be helpful to assess for small bowel versus large bowel location. These results were called by telephone at the time of  interpretation on 07/06/2019 at 4:30 pm to provider [facility] , who verbally acknowledged these results. Electronically Signed   By: Bretta Bang III M.D.   On: 07/06/2019 16:30   DG Abd Portable 1V  Result Date: 07/08/2019 CLINICAL DATA:  Migrated esophageal stent EXAM: PORTABLE ABDOMEN - 1 VIEW COMPARISON:  Radiograph 07/06/2019, CT 07/06/2019 FINDINGS: A an esophageal stent projects over the right hemipelvis in a similar location to that which was seen on the comparison CT and radiograph 1 day prior. Multiple air-filled loops of  bowel in the abdomen are similar to the comparison exam. These loops are at the upper limits of normal for size but without high-grade obstructive pattern at this time. Surgical clips present in the right upper quadrant. Percutaneous gastrostomy tube projects over the upper abdomen. IMPRESSION: Stable position of the migrated esophageal stent projecting over the pelvis. Scattered air-filled loops of bowel within the abdomen but without convincing features of high-grade obstruction. Electronically Signed   By: Kreg ShropshirePrice  DeHay M.D.   On: 07/08/2019 06:52    Lab Data:  CBC: Recent Labs  Lab 07/06/19 1346 07/06/19 1346 07/06/19 1800 07/06/19 2347 07/07/19 0313 07/08/19 0326 07/09/19 0400  WBC 18.4 Repeated and verified X2.*   < > 16.0* 13.1* 13.3* 7.7 16.5*  NEUTROABS 12.4*  --  10.8*  --   --   --   --   HGB 13.4   < > 12.8* 12.5* 12.3* 11.2* 11.9*  HCT 39.7   < > 38.2* 37.1* 37.9* 34.2* 35.4*  MCV 88.0   < > 90.3 90.3 91.1 91.2 90.5  PLT 452.0*   < > 411* 377 366 332 356   < > = values in this interval not displayed.   Basic Metabolic Panel: Recent Labs  Lab 07/06/19 1346 07/06/19 1346 07/06/19 1800 07/06/19 2347 07/07/19 0313 07/08/19 0326 07/09/19 0400  NA 135  --  136  --  140 144 142  K 4.5  --  4.0  --  4.1 3.3* 4.3  CL 101  --  104  --  107 108 112*  CO2 27  --  24  --  25 25 22   GLUCOSE 106*  --  107*  --  102* 97 142*  BUN 11   --  12  --  10 11 12   CREATININE 0.80   < > 0.75 0.84 0.90 0.81 0.86  CALCIUM 9.4  --  8.7*  --  8.9 8.6* 8.6*  MG  --   --   --   --   --   --  1.9   < > = values in this interval not displayed.   GFR: Estimated Creatinine Clearance: 88.6 mL/min (by C-G formula based on SCr of 0.86 mg/dL). Liver Function Tests: Recent Labs  Lab 07/06/19 1346 07/06/19 1800 07/07/19 0313  AST 10 13* 11*  ALT 8 11 12   ALKPHOS 115 107 101  BILITOT 0.4 0.5 0.7  PROT 7.1 6.9 6.6  ALBUMIN 3.7 3.5 3.2*   Recent Labs  Lab 07/06/19 1346 07/06/19 1800  LIPASE 11.0 18  AMYLASE 25*  --    No results for input(s): AMMONIA in the last 168 hours. Coagulation Profile: No results for input(s): INR, PROTIME in the last 168 hours. Cardiac Enzymes: No results for input(s): CKTOTAL, CKMB, CKMBINDEX, TROPONINI in the last 168 hours. BNP (last 3 results) No results for input(s): PROBNP in the last 8760 hours. HbA1C: No results for input(s): HGBA1C in the last 72 hours. CBG: No results for input(s): GLUCAP in the last 168 hours. Lipid Profile: No results for input(s): CHOL, HDL, LDLCALC, TRIG, CHOLHDL, LDLDIRECT in the last 72 hours. Thyroid Function Tests: No results for input(s): TSH, T4TOTAL, FREET4, T3FREE, THYROIDAB in the last 72 hours. Anemia Panel: No results for input(s): VITAMINB12, FOLATE, FERRITIN, TIBC, IRON, RETICCTPCT in the last 72 hours. Urine analysis:    Component Value Date/Time   COLORURINE AMBER (A) 07/06/2019 2059   APPEARANCEUR CLEAR 07/06/2019 2059   LABSPEC 1.045 (H) 07/06/2019 2059  PHURINE 7.0 07/06/2019 2059   GLUCOSEU NEGATIVE 07/06/2019 2059   HGBUR NEGATIVE 07/06/2019 2059   BILIRUBINUR NEGATIVE 07/06/2019 2059   KETONESUR NEGATIVE 07/06/2019 2059   PROTEINUR NEGATIVE 07/06/2019 2059   UROBILINOGEN 1.0 12/19/2014 0646   NITRITE NEGATIVE 07/06/2019 2059   LEUKOCYTESUR NEGATIVE 07/06/2019 2059     Breann Losano M.D. [facility] 07/09/2019, 10:19 AM   Call  night coverage person covering after 7pm

## 2019-07-10 LAB — CBC
HCT: 34.1 % — ABNORMAL LOW (ref 39.0–52.0)
Hemoglobin: 10.9 g/dL — ABNORMAL LOW (ref 13.0–17.0)
MCH: 29.6 pg (ref 26.0–34.0)
MCHC: 32 g/dL (ref 30.0–36.0)
MCV: 92.7 fL (ref 80.0–100.0)
Platelets: 357 10*3/uL (ref 150–400)
RBC: 3.68 MIL/uL — ABNORMAL LOW (ref 4.22–5.81)
RDW: 12.7 % (ref 11.5–15.5)
WBC: 7.5 10*3/uL (ref 4.0–10.5)
nRBC: 0 % (ref 0.0–0.2)

## 2019-07-10 LAB — BASIC METABOLIC PANEL
Anion gap: 8 (ref 5–15)
BUN: 8 mg/dL (ref 6–20)
CO2: 21 mmol/L — ABNORMAL LOW (ref 22–32)
Calcium: 8.4 mg/dL — ABNORMAL LOW (ref 8.9–10.3)
Chloride: 111 mmol/L (ref 98–111)
Creatinine, Ser: 0.7 mg/dL (ref 0.61–1.24)
GFR calc Af Amer: >60 mL/min (ref >60)
GFR calc non Af Amer: >60 mL/min (ref >60)
Glucose, Bld: 94 mg/dL (ref 70–99)
Potassium: 3.8 mmol/L (ref 3.5–5.1)
Sodium: 140 mmol/L (ref 135–145)

## 2019-07-10 MED ORDER — DOCUSATE SODIUM 100 MG PO CAPS
100.0000 mg | ORAL_CAPSULE | Freq: Two times a day (BID) | ORAL | Status: DC
  Administered 2019-07-10 – 2019-07-13 (×5): 100 mg via ORAL
  Filled 2019-07-10 (×6): qty 1

## 2019-07-10 NOTE — Anesthesia Postprocedure Evaluation (Signed)
Anesthesia Post Note  Patient: Vincent Clarke  Procedure(s) Performed: LAPAROSCOPIC ASSISTED REMOVAL FOREIGN BODY FROM SMALL INTESTINE/SMALL BOWEL RESECTION (N/A )     Patient location during evaluation: PACU Anesthesia Type: General Level of consciousness: awake and alert Pain management: pain level controlled Vital Signs Assessment: post-procedure vital signs reviewed and stable Respiratory status: spontaneous breathing, nonlabored ventilation and respiratory function stable Cardiovascular status: blood pressure returned to baseline and stable Postop Assessment: no apparent nausea or vomiting Anesthetic complications: no    Last Vitals:  Vitals:   07/10/19 0513 07/10/19 1352  BP: (!) 151/82 (!) 163/91  Pulse: (!) 55 (!) 57  Resp: 16 19  Temp: 37.2 C 36.7 C  SpO2: 97% 97%    Last Pain:  Vitals:   07/10/19 1352  TempSrc: Oral  PainSc:                  Lucretia Kern

## 2019-07-10 NOTE — Progress Notes (Signed)
[facility]                                                                              Patient Demographics  Vincent Clarke, is a 54 y.o. male, DOB - [birth year], VQM:[id number]  Admit date - 07/06/2019   Admitting Physician Thalia Party, DO  Outpatient Primary MD for the patient is Reymundo Poll, MD  Outpatient specialists:   LOS - 4  days   Medical records reviewed and are as summarized below:    Chief Complaint  Patient presents with  . 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  Pain also associated with nausea and vomiting, poor appetite.  Patient also has PEG tube for supplementation but not sure if it was used for feeding.  Patient contacted his GI physician because of severe pain and imaging showed that stent was not in esophagus and was recommended to go to ED.  CT abdomen pelvis showed dislodged esophageal stent that was seen in distal jejunum, evidence of small bowel obstruction but no perforation.  Patient was admitted for further management, GI and general surgery were consulted. Status post laparoscopic removal of the foreign body from small intestine, open small bowel resection.   Assessment & Plan    Principal Problem: Abdominal pain, secondary to SBO from esophageal stent migration -Patient presented with progressive abdominal pain, nausea vomiting.  Imaging confirmed dislodgment of his esophageal stent and was directed to ED. -CT abdomen pelvis showed dislodged esophageal stent and distal jejunum with resultant wall thickening and fat stranding with associated SBO. -General surgery, GI consulted. -Underwent laparoscopic assisted removal of the foreign body, postop day #2 -General surgery following, continue bowel rest for now, hopefully start  clears tomorrow if bowel function improves -Continues to complain of severe abdominal pain, hold off on Suboxone for now, continue pain control  Hypokalemia Resolved  Essential hypertension -BP likely elevated due to abdominal pain, continue Vasotec IV as needed with parameters  Polysubstance abuse - UDS + amphetamines and opiates -Holding Suboxone for now.  Patient will resume at discharge  History of esophagitis -Continue Pepcid, Protonix  Nicotine use Continue nicotine patch  Constipation Continue Dulcolax supp, added Colace   Code Status: full  DVT Prophylaxis:  Lovenox  Family Communication: Discussed all imaging results, lab results, explained to the patient    Disposition Plan: Patient from home, anticipated discharge to home once resolution of the SBO and patient tolerating diet, cleared by general surgery.  Complaining of severe abdominal pain.   Time Spent in minutes 25 minutes  Procedures:  Laparoscopic assisted removal of foreign body from the small intestine  Consultants:   General surgery GI  Antimicrobials:   Anti-infectives (From admission, onward)   Start     Dose/Rate Route Frequency Ordered Stop   07/07/19 0100  piperacillin-tazobactam (ZOSYN) IVPB 3.375 g     3.375 g 12.5 mL/hr over 240 Minutes Intravenous Every 8 hours 07/06/19 2318  07/06/19 2015  piperacillin-tazobactam (ZOSYN) IVPB 3.375 g     3.375 g 100 mL/hr over 30 Minutes Intravenous  Once 07/06/19 2006 07/06/19 2204         Medications  Scheduled Meds: . bisacodyl  10 mg Rectal Daily  . Chlorhexidine Gluconate Cloth  6 each Topical Daily  . enoxaparin (LOVENOX) injection  40 mg Subcutaneous QHS  . lip balm  1 application Topical BID  . nicotine  21 mg Transdermal Daily  . pantoprazole (PROTONIX) IV  40 mg Intravenous QHS   Continuous Infusions: . dextrose 5 % and 0.9 % NaCl with KCl 20 mEq/L 100 mL/hr at 07/10/19 1113  . famotidine (PEPCID) IV Stopped (07/10/19  1000)  . lactated ringers 50 mL/hr at 07/08/19 1345  . methocarbamol (ROBAXIN) IV Stopped (07/09/19 1244)  . ondansetron (ZOFRAN) IV    . piperacillin-tazobactam (ZOSYN)  IV 3.375 g (07/10/19 1233)   PRN Meds:.acetaminophen, acetaminophen **OR** [DISCONTINUED] acetaminophen, diphenhydrAMINE, enalaprilat, ketorolac, magic mouthwash, methocarbamol (ROBAXIN) IV, metoprolol tartrate, morphine injection, ondansetron (ZOFRAN) IV **OR** ondansetron (ZOFRAN) IV, prochlorperazine      Subjective:   Ridge Lafond was seen and examined today.  Complaining of abdominal pain 8/10.  No fevers or chills.  No acute events overnight.  Constipation.  No headache, shortness of breath or chest pain.  Objective:   Vitals:   07/09/19 0500 07/09/19 1357 07/09/19 2127 07/10/19 0513  BP: (!) 147/84 128/80 (!) 158/76 (!) 151/82  Pulse: (!) 59 (!) 58 (!) 59 (!) 55  Resp: 16 16 16 16   Temp: 99 F (37.2 C) 98 F (36.7 C) 99 F (37.2 C) 98.9 F (37.2 C)  TempSrc:      SpO2: 96% 96% 96% 97%  Weight:      Height:        Intake/Output Summary (Last 24 hours) at 07/10/2019 1234 Last data filed at 07/10/2019 1117 Gross per 24 hour  Intake 2879.18 ml  Output 2001 ml  Net 878.18 ml     Wt Readings from Last 3 Encounters:  07/07/19 69.3 kg  05/30/19 73.9 kg  05/19/19 73.9 kg   Physical Exam  General: Alert and oriented x 3, NAD, uncomfortable  Cardiovascular: S1 S2 clear, RRR. No pedal edema b/l  Respiratory: CTAB, no wheezing, rales or rhonchi  Gastrointestinal: Soft, G-tube, hypoactive bowel sounds, midline dressing intact  Ext: no pedal edema bilaterally  Neuro: no new deficits  Musculoskeletal: No cyanosis, clubbing  Skin: No rashes  Psych: Normal affect and demeanor, alert and oriented x3       Data Reviewed:  I have personally reviewed following labs and imaging studies  Micro Results Recent Results (from the past 240 hour(s))  Culture, blood (routine x 2)     Status: None  (Preliminary result)   Collection Time: 07/06/19  7:40 PM   Specimen: BLOOD  Result Value Ref Range Status   Specimen Description   Final    BLOOD RIGHT ANTECUBITAL Performed at [facility], [address], [place], [place]    Special Requests   Final    BOTTLES DRAWN AEROBIC AND ANAEROBIC Blood Culture adequate volume Performed at [facility], [address], [place], [place]    Culture   Final    NO GROWTH 4 DAYS Performed at [facility], [address] [place], [place]    Report Status PENDING  Incomplete  Culture, blood (routine x 2)     Status:  None (Preliminary result)   Collection Time: 07/06/19  7:41 PM   Specimen: BLOOD  Result Value Ref Range Status   Specimen Description   Final    BLOOD LEFT ANTECUBITAL Performed at [facility] [address] [place], [place] [address]    Special Requests   Final    BOTTLES DRAWN AEROBIC AND ANAEROBIC Blood Culture adequate volume Performed at [facility] [address], [place], [place] [address]    Culture   Final    NO GROWTH 4 DAYS Performed at [facility], [place] [address] [place], [place] [address]    Report Status PENDING  Incomplete  Respiratory Panel by RT PCR (Flu A&B, Covid) - Nasopharyngeal Swab     Status: None   Collection Time: 07/06/19  8:39 PM   Specimen: Nasopharyngeal Swab  Result Value Ref Range Status   SARS Coronavirus 2 by RT PCR NEGATIVE NEGATIVE Final    Comment: (NOTE) SARS-CoV-2 target nucleic acids are NOT DETECTED. The SARS-CoV-2 RNA is generally detectable in upper respiratoy specimens during the acute phase of infection. The lowest concentration of SARS-CoV-2 viral copies this assay can detect is 131 copies/mL. A negative result does not preclude SARS-Cov-2 infection and should not be used as the sole basis for treatment or other patient management decisions. A negative  result may occur with  improper specimen collection/handling, submission of specimen other than nasopharyngeal swab, presence of viral mutation(s) within the areas targeted by this assay, and inadequate number of viral copies (<131 copies/mL). A negative result must be combined with clinical observations, patient history, and epidemiological information. The expected result is Negative. Fact Sheet for Patients:  PinkCheek.be Fact Sheet for Healthcare Providers:  GravelBags.it This test is not yet ap proved or cleared by the [place] FDA and  has been authorized for detection and/or diagnosis of SARS-CoV-2 by FDA under an Emergency Use Authorization (EUA). This EUA will remain  in effect (meaning this test can be used) for the duration of the COVID-19 declaration under Section 564(b)(1) of the Act, 21 U.S.C. section 360bbb-3(b)(1), unless the authorization is terminated or revoked sooner.    Influenza A by PCR NEGATIVE NEGATIVE Final   Influenza B by PCR NEGATIVE NEGATIVE Final    Comment: (NOTE) The Xpert Xpress SARS-CoV-2/FLU/RSV assay is intended as an aid in  the diagnosis of influenza from Nasopharyngeal swab specimens and  should not be used as a sole basis for treatment. Nasal washings and  aspirates are unacceptable for Xpert Xpress SARS-CoV-2/FLU/RSV  testing. Fact Sheet for Patients: PinkCheek.be Fact Sheet for Healthcare Providers: GravelBags.it This test is not yet approved or cleared by the [place] FDA and  has been authorized for detection and/or diagnosis of SARS-CoV-2 by  FDA under an Emergency Use Authorization (EUA). This EUA will remain  in effect (meaning this test can be used) for the duration of the  Covid-19 declaration under Section 564(b)(1) of the Act, 21  U.S.C. section 360bbb-3(b)(1), unless the authorization is  terminated or  revoked. Performed at [facility], [place] [address] [place], [place] [address]   Culture, blood (Routine X 2) w Reflex to ID Panel     Status: None (Preliminary result)   Collection Time: 07/09/19  9:25 AM   Specimen: BLOOD RIGHT HAND  Result Value Ref Range Status   Specimen Description   Final    BLOOD RIGHT HAND Performed at [facility] [address] [place],  [place] [address]    Special Requests   Final    BOTTLES DRAWN AEROBIC AND ANAEROBIC Blood Culture adequate volume Performed at [facility], [address], [place], [place] [address]    Culture   Final    NO GROWTH < 24 HOURS Performed at [facility], [address] [place], [place] [address]    Report Status PENDING  Incomplete  Culture, blood (Routine X 2) w Reflex to ID Panel     Status: None (Preliminary result)   Collection Time: 07/09/19  9:31 AM   Specimen: BLOOD LEFT HAND  Result Value Ref Range Status   Specimen Description   Final    BLOOD LEFT HAND Performed at [facility], [address], [place], [place] [address]    Special Requests   Final    BOTTLES DRAWN AEROBIC AND ANAEROBIC Blood Culture adequate volume Performed at [facility], [place], [address] [place], [place] [address]    Culture   Final    NO GROWTH < 24 HOURS Performed at [facility], [address] [place], [place] [address]    Report Status PENDING  Incomplete    Radiology Reports DG Chest 2 View  Result Date: 07/06/2019 CLINICAL DATA:  Jewel stent placement EXAM: CHEST - 2 VIEW COMPARISON:  January 22, 2019. FINDINGS: Esophageal stent is not present within the chest. Lungs are clear. Heart size and pulmonary vascular normal. No adenopathy. No pneumomediastinum or pneumothorax. There is aortic atherosclerosis. There is mild degenerative change in the thoracic spine. IMPRESSION: Stent is not present in the chest. No  pneumomediastinum or pneumothorax. Lungs clear. Cardiac silhouette normal. Aortic Atherosclerosis (ICD10-I70.0). Electronically Signed   By: Bretta Bang III M.D.   On: 07/06/2019 16:31   CT ABDOMEN PELVIS W CONTRAST  Result Date: 07/06/2019 CLINICAL DATA:  Mild positioning of esophageal stent on abdominal x-ray performed earlier today, abdominal pain below gastrostomy tube EXAM: CT ABDOMEN AND PELVIS WITH CONTRAST TECHNIQUE: Multidetector CT imaging of the abdomen and pelvis was performed using the standard protocol following bolus administration of intravenous contrast. CONTRAST:  OMNIPAQUE IOHEXOL 300 MG/ML  SOLN COMPARISON:  07/06/2019, 03/03/2019 FINDINGS: Lower chest: No acute pleural or parenchymal lung disease. Hepatobiliary: No focal liver abnormality is seen. Status post cholecystectomy. No biliary dilatation. Pancreas: Unremarkable. No pancreatic ductal dilatation or surrounding inflammatory changes. Spleen: Normal in size without focal abnormality. Adrenals/Urinary Tract: Adrenal glands are unremarkable. Kidneys are normal, without renal calculi, focal lesion, or hydronephrosis. Bladder is unremarkable. Stomach/Bowel: The dislodged esophageal stent is seen on abdominal x-ray earlier today is identified on this exam within the mid to distal jejunum. Proximal small bowel dilatation consistent with obstruction as result of the dislodged stent. There is bowel wall thickening and surrounding mesenteric fat stranding at the site of obstruction within the distal jejunum lower pelvis. No evidence of pneumatosis or bowel perforation at this time. Percutaneous gastrostomy tube is seen within the gastric lumen. No complications related to the gastrostomy tube site. Vascular/Lymphatic: Aortic atherosclerosis. No enlarged abdominal or pelvic lymph nodes. Reproductive: Prostate is unremarkable. Other: Mild mesenteric edema within the lower pelvis. No free fluid or free gas. Musculoskeletal: No acute or  destructive bony lesions. Reconstructed images demonstrate no additional findings. IMPRESSION: 1. Dislodged esophageal stent, seen within the distal jejunum. There is resulting wall thickening, surrounding fat stranding, and small bowel obstruction. Surgical consultation recommended. 2. Unremarkable percutaneous gastrostomy tube. Electronically Signed   By: Casimiro Needle  Manson Passey M.D.   On: 07/06/2019 19:23   DG Abd 2 Views  Result Date: 07/06/2019 CLINICAL DATA:  Migrated esophageal stent EXAM: ABDOMEN - 2 VIEW COMPARISON:  None. FINDINGS: Esophageal stent is in the pelvis, either in a loop of distal small bowel or in the sigmoid colon. There is no free air. There are loops of borderline dilated small bowel with multiple air-fluid levels. There are surgical clips in the left upper abdomen. There is an apparent gastrostomy catheter in the stomach region. IMPRESSION: Esophageal stent has migrated into the pelvis, either within a loop of distal small bowel or sigmoid colon. Borderline dilatation of several loops of small bowel with multiple air-fluid levels. Question a degree of enteritis versus early bowel obstruction. A degree of ileus could present similarly. No evident free air. Gastrostomy catheter in stomach region. With respect to location of the stent, correlation with CT may be helpful to assess for small bowel versus large bowel location. These results were called by telephone at the time of interpretation on 07/06/2019 at 4:30 pm to provider [facility] , who verbally acknowledged these results. Electronically Signed   By: Bretta Bang III M.D.   On: 07/06/2019 16:30   DG Abd Portable 1V  Result Date: 07/08/2019 CLINICAL DATA:  Migrated esophageal stent EXAM: PORTABLE ABDOMEN - 1 VIEW COMPARISON:  Radiograph 07/06/2019, CT 07/06/2019 FINDINGS: A an esophageal stent projects over the right hemipelvis in a similar location to that which was seen on the comparison CT and radiograph 1 day prior.  Multiple air-filled loops of bowel in the abdomen are similar to the comparison exam. These loops are at the upper limits of normal for size but without high-grade obstructive pattern at this time. Surgical clips present in the right upper quadrant. Percutaneous gastrostomy tube projects over the upper abdomen. IMPRESSION: Stable position of the migrated esophageal stent projecting over the pelvis. Scattered air-filled loops of bowel within the abdomen but without convincing features of high-grade obstruction. Electronically Signed   By: Kreg Shropshire M.D.   On: 07/08/2019 06:52    Lab Data:  CBC: Recent Labs  Lab 07/06/19 1346 07/06/19 1346 07/06/19 1800 07/06/19 1800 07/06/19 2347 07/07/19 0313 07/08/19 0326 07/09/19 0400 07/10/19 0516  WBC 18.4 Repeated and verified X2.*   < > 16.0*   < > 13.1* 13.3* 7.7 16.5* 7.5  NEUTROABS 12.4*  --  10.8*  --   --   --   --   --   --   HGB 13.4   < > 12.8*   < > 12.5* 12.3* 11.2* 11.9* 10.9*  HCT 39.7   < > 38.2*   < > 37.1* 37.9* 34.2* 35.4* 34.1*  MCV 88.0   < > 90.3   < > 90.3 91.1 91.2 90.5 92.7  PLT 452.0*   < > 411*   < > 377 366 332 356 357   < > = values in this interval not displayed.   Basic Metabolic Panel: Recent Labs  Lab 07/07/19 0313 07/08/19 0326 07/09/19 0400 07/09/19 1052 07/10/19 0741  NA 140 144 142 141 140  K 4.1 3.3* 4.3 3.8 3.8  CL 107 108 112* 112* 111  CO2 25 25 22  21* 21*  GLUCOSE 102* 97 142* 108* 94  BUN 10 11 12 12 8   CREATININE 0.90 0.81 0.86 0.80 0.70  CALCIUM 8.9 8.6* 8.6* 8.3* 8.4*  MG  --   --  1.9  --   --  GFR: Estimated Creatinine Clearance: 95.3 mL/min (by C-G formula based on SCr of 0.7 mg/dL). Liver Function Tests: Recent Labs  Lab 07/06/19 1346 07/06/19 1800 07/07/19 0313 07/09/19 1052  AST 10 13* 11* 12*  ALT 8 11 12 11   ALKPHOS 115 107 101 76  BILITOT 0.4 0.5 0.7 0.5  PROT 7.1 6.9 6.6 6.0*  ALBUMIN 3.7 3.5 3.2* 2.8*   Recent Labs  Lab 07/06/19 1346 07/06/19 1800  LIPASE  11.0 18  AMYLASE 25*  --    No results for input(s): AMMONIA in the last 168 hours. Coagulation Profile: No results for input(s): INR, PROTIME in the last 168 hours. Cardiac Enzymes: No results for input(s): CKTOTAL, CKMB, CKMBINDEX, TROPONINI in the last 168 hours. BNP (last 3 results) No results for input(s): PROBNP in the last 8760 hours. HbA1C: No results for input(s): HGBA1C in the last 72 hours. CBG: No results for input(s): GLUCAP in the last 168 hours. Lipid Profile: No results for input(s): CHOL, HDL, LDLCALC, TRIG, CHOLHDL, LDLDIRECT in the last 72 hours. Thyroid Function Tests: No results for input(s): TSH, T4TOTAL, FREET4, T3FREE, THYROIDAB in the last 72 hours. Anemia Panel: No results for input(s): VITAMINB12, FOLATE, FERRITIN, TIBC, IRON, RETICCTPCT in the last 72 hours. Urine analysis:    Component Value Date/Time   COLORURINE AMBER (A) 07/06/2019 2059   APPEARANCEUR CLEAR 07/06/2019 2059   LABSPEC 1.045 (H) 07/06/2019 2059   PHURINE 7.0 07/06/2019 2059   GLUCOSEU NEGATIVE 07/06/2019 2059   HGBUR NEGATIVE 07/06/2019 2059   BILIRUBINUR NEGATIVE 07/06/2019 2059   KETONESUR NEGATIVE 07/06/2019 2059   PROTEINUR NEGATIVE 07/06/2019 2059   UROBILINOGEN 1.0 12/19/2014 0646   NITRITE NEGATIVE 07/06/2019 2059   LEUKOCYTESUR NEGATIVE 07/06/2019 2059     Alazia Crocket M.D. [facility] 07/10/2019, 12:34 PM   Call night coverage person covering after 7pm

## 2019-07-10 NOTE — Progress Notes (Signed)
2 Days Post-Op   Subjective/Chief Complaint: On bedside commode Patient on bedside commode.  He is having some abdominal pain and the urge to defecate.   Objective: Vital signs in last 24 hours: Temp:  [98 F (36.7 C)-99 F (37.2 C)] 98.9 F (37.2 C) (02/28 0513) Pulse Rate:  [55-59] 55 (02/28 0513) Resp:  [16] 16 (02/28 0513) BP: (128-158)/(76-82) 151/82 (02/28 0513) SpO2:  [96 %-97 %] 97 % (02/28 0513) Last BM Date: 07/10/19  Intake/Output from previous day: 02/27 0701 - 02/28 0700 In: 833.5 [I.V.:610.3; IV Piggyback:223.2] Out: 1300 [Urine:1300] Intake/Output this shift: Total I/O In: 2045.7 [P.O.:60; I.V.:1741.8; IV Piggyback:243.9] Out: 801 [Urine:600; Drains:200; Stool:1]  Incision/Wound: Dressing intact abdomen soft.  Distended but soft.  Lab Results:  Recent Labs    07/09/19 0400 07/10/19 0516  WBC 16.5* 7.5  HGB 11.9* 10.9*  HCT 35.4* 34.1*  PLT 356 357   BMET Recent Labs    07/09/19 1052 07/10/19 0741  NA 141 140  K 3.8 3.8  CL 112* 111  CO2 21* 21*  GLUCOSE 108* 94  BUN 12 8  CREATININE 0.80 0.70  CALCIUM 8.3* 8.4*   PT/INR No results for input(s): LABPROT, INR in the last 72 hours. ABG No results for input(s): PHART, HCO3 in the last 72 hours.  Invalid input(s): PCO2, PO2  Studies/Results: No results found.  Anti-infectives: Anti-infectives (From admission, onward)   Start     Dose/Rate Route Frequency Ordered Stop   07/07/19 0100  piperacillin-tazobactam (ZOSYN) IVPB 3.375 g     3.375 g 12.5 mL/hr over 240 Minutes Intravenous Every 8 hours 07/06/19 2318     07/06/19 2015  piperacillin-tazobactam (ZOSYN) IVPB 3.375 g     3.375 g 100 mL/hr over 30 Minutes Intravenous  Once 07/06/19 2006 07/06/19 2204      Assessment/Plan: s/p Procedure(s): LAPAROSCOPIC ASSISTED REMOVAL FOREIGN BODY FROM SMALL INTESTINE/SMALL BOWEL RESECTION (N/A) Continue bowel rest for now.  Hopefully start clear liquids tomorrow if bowel function continues.   He will need cystogram probably tomorrow Tuesday before Foley catheter can be removed so this will be continued.  Hopefully, advance diet in the next couple days to avoid TNA.  Out of bed ambulate  LOS: 4 days    Clovis Pu Galvin Aversa 07/10/2019

## 2019-07-10 NOTE — Plan of Care (Signed)
  Problem: Clinical Measurements: Goal: Cardiovascular complication will be avoided Outcome: Progressing   Problem: Activity: Goal: Risk for activity intolerance will decrease Outcome: Progressing   Problem: Clinical Measurements: Goal: Diagnostic test results will improve Outcome: Progressing   Problem: Coping: Goal: Level of anxiety will decrease Outcome: Progressing   Problem: Elimination: Goal: Will not experience complications related to bowel motility Outcome: Progressing   Problem: Safety: Goal: Ability to remain free from injury will improve Outcome: Progressing   Problem: Pain Managment: Goal: General experience of comfort will improve Outcome: Progressing   Problem: Skin Integrity: Goal: Risk for impaired skin integrity will decrease Outcome: Progressing

## 2019-07-11 ENCOUNTER — Encounter: Payer: Self-pay | Admitting: *Deleted

## 2019-07-11 LAB — BASIC METABOLIC PANEL
Anion gap: 6 (ref 5–15)
BUN: 6 mg/dL (ref 6–20)
CO2: 22 mmol/L (ref 22–32)
Calcium: 8.3 mg/dL — ABNORMAL LOW (ref 8.9–10.3)
Chloride: 111 mmol/L (ref 98–111)
Creatinine, Ser: 0.76 mg/dL (ref 0.61–1.24)
GFR calc Af Amer: >60 mL/min (ref >60)
GFR calc non Af Amer: >60 mL/min (ref >60)
Glucose, Bld: 94 mg/dL (ref 70–99)
Potassium: 3.5 mmol/L (ref 3.5–5.1)
Sodium: 139 mmol/L (ref 135–145)

## 2019-07-11 LAB — MAGNESIUM: Magnesium: 1.8 mg/dL (ref 1.7–2.4)

## 2019-07-11 LAB — CBC
HCT: 32.8 % — ABNORMAL LOW (ref 39.0–52.0)
Hemoglobin: 11 g/dL — ABNORMAL LOW (ref 13.0–17.0)
MCH: 30.3 pg (ref 26.0–34.0)
MCHC: 33.5 g/dL (ref 30.0–36.0)
MCV: 90.4 fL (ref 80.0–100.0)
Platelets: 338 10*3/uL (ref 150–400)
RBC: 3.63 MIL/uL — ABNORMAL LOW (ref 4.22–5.81)
RDW: 12.4 % (ref 11.5–15.5)
WBC: 8.6 10*3/uL (ref 4.0–10.5)
nRBC: 0 % (ref 0.0–0.2)

## 2019-07-11 LAB — SURGICAL PATHOLOGY

## 2019-07-11 LAB — CULTURE, BLOOD (ROUTINE X 2)
Culture: NO GROWTH
Culture: NO GROWTH
Special Requests: ADEQUATE
Special Requests: ADEQUATE

## 2019-07-11 MED ORDER — POTASSIUM CHLORIDE 20 MEQ PO PACK
20.0000 meq | PACK | Freq: Two times a day (BID) | ORAL | Status: DC
  Administered 2019-07-11: 20 meq
  Filled 2019-07-11 (×2): qty 1

## 2019-07-11 NOTE — Progress Notes (Signed)
[facility]                                                                              Patient Demographics  Vincent Clarke, is a 54 y.o. male, DOB - [birth year], BOF:[id number]  Admit date - 07/06/2019   Admitting Physician Edmonia Lynch, DO  Outpatient Primary MD for the patient is [name], MD  Outpatient specialists:   LOS - 5  days   Medical records reviewed and are as summarized below:    Chief Complaint  Patient presents with  . 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  Pain also associated with nausea and vomiting, poor appetite.  Patient also has PEG tube for supplementation but not sure if it was used for feeding.  Patient contacted his GI physician because of severe pain and imaging showed that stent was not in esophagus and was recommended to go to ED.  CT abdomen pelvis showed dislodged esophageal stent that was seen in distal jejunum, evidence of small bowel obstruction but no perforation.  Patient was admitted for further management, GI and general surgery were consulted. Status post laparoscopic removal of the foreign body from small intestine, open small bowel resection.   Assessment & Plan    Principal Problem: Abdominal pain, secondary to SBO from esophageal stent migration -Patient presented with progressive abdominal pain, nausea vomiting.  Imaging confirmed dislodgment of his esophageal stent and was directed to ED. -CT abdomen pelvis showed dislodged esophageal stent and distal jejunum with resultant wall thickening and fat stranding with associated SBO. -General surgery, GI consulted. -Underwent laparoscopic assisted removal of the foreign body, postop day #3 -States pain is better today, Suboxone placed on hold for now -Awaiting  general surgery recommendations, regarding diet.   - Per patient had a BM yesterday  Hypokalemia Resolved, potassium 3.5  Essential hypertension -BP improving, continue IV Vasotec as needed  Polysubstance abuse - UDS + amphetamines and opiates -Holding Suboxone for now for better pain control.  Patient will resume at discharge  History of esophagitis -Continue Pepcid, Protonix  Nicotine use Continue nicotine patch  Constipation Continue Dulcolax supp, added Colace   Code Status: full  DVT Prophylaxis:  Lovenox  Family Communication: Discussed all imaging results, lab results, explained to the patient    Disposition Plan: Patient from home, anticipated discharge to home once resolution of the SBO and patient tolerating diet and cleared by general surgery.    Time Spent in minutes 25 minutes  Procedures:  Laparoscopic assisted removal of foreign body from the small intestine  Consultants:   General surgery GI  Antimicrobials:   Anti-infectives (From admission, onward)   Start     Dose/Rate Route Frequency Ordered Stop   07/07/19 0100  piperacillin-tazobactam (ZOSYN) IVPB 3.375 g     3.375 g 12.5 mL/hr over 240 Minutes Intravenous Every 8 hours 07/06/19 2318     07/06/19 2015  piperacillin-tazobactam (ZOSYN) IVPB 3.375 g  3.375 g 100 mL/hr over 30 Minutes Intravenous  Once 07/06/19 2006 07/06/19 2204         Medications  Scheduled Meds: . bisacodyl  10 mg Rectal Daily  . Chlorhexidine Gluconate Cloth  6 each Topical Daily  . docusate sodium  100 mg Oral BID  . enoxaparin (LOVENOX) injection  40 mg Subcutaneous QHS  . lip balm  1 application Topical BID  . nicotine  21 mg Transdermal Daily  . pantoprazole (PROTONIX) IV  40 mg Intravenous QHS   Continuous Infusions: . dextrose 5 % and 0.9 % NaCl with KCl 20 mEq/L 100 mL/hr at 07/11/19 0604  . famotidine (PEPCID) IV 20 mg (07/11/19 1007)  . lactated ringers 50 mL/hr at 07/08/19 1345  .  methocarbamol (ROBAXIN) IV Stopped (07/10/19 1341)  . ondansetron (ZOFRAN) IV    . piperacillin-tazobactam (ZOSYN)  IV 3.375 g (07/11/19 0652)   PRN Meds:.acetaminophen, acetaminophen **OR** [DISCONTINUED] acetaminophen, diphenhydrAMINE, enalaprilat, ketorolac, magic mouthwash, methocarbamol (ROBAXIN) IV, metoprolol tartrate, morphine injection, ondansetron (ZOFRAN) IV **OR** ondansetron (ZOFRAN) IV, prochlorperazine      Subjective:   [name] was seen and examined today.  States abdominal pain is better, hungry wants to eat.  No nausea or vomiting.  No fevers or chills.  States had a BM yesterday.  No chest pain or shortness of breath or headache.  No acute events overnight  Objective:   Vitals:   07/10/19 0513 07/10/19 1352 07/10/19 2050 07/11/19 0541  BP: (!) 151/82 (!) 163/91 (!) 147/92 139/81  Pulse: (!) 55 (!) 57 62 (!) 56  Resp: 16 19 17 17   Temp: 98.9 F (37.2 C) 98 F (36.7 C) 98.3 F (36.8 C) 98.1 F (36.7 C)  TempSrc:  Oral Oral Oral  SpO2: 97% 97% 96% 96%  Weight:      Height:        Intake/Output Summary (Last 24 hours) at 07/11/2019 1147 Last data filed at 07/11/2019 09/10/2019 Gross per 24 hour  Intake 2279.95 ml  Output 3380 ml  Net -1100.05 ml     Wt Readings from Last 3 Encounters:  07/07/19 69.3 kg  05/30/19 73.9 kg  05/19/19 73.9 kg   Physical Exam  General: Alert and oriented x 3, NAD  Cardiovascular: S1 S2 clear, RRR. No pedal edema b/l  Respiratory: CTAB, no wheezing, rales or rhonchi  Gastrointestinal: Soft, G-tube, bowel sounds+   Ext: no pedal edema bilaterally  Neuro: no new deficits  Musculoskeletal: No cyanosis, clubbing  Skin: No rashes  Psych: Normal affect and demeanor, alert and oriented x3        Data Reviewed:  I have personally reviewed following labs and imaging studies  Micro Results Recent Results (from the past 240 hour(s))  Culture, blood (routine x 2)     Status: None   Collection Time: 07/06/19  7:40 PM     Specimen: BLOOD  Result Value Ref Range Status   Specimen Description   Final    BLOOD RIGHT ANTECUBITAL Performed at [facility], [address] [place], [place]    Special Requests   Final    BOTTLES DRAWN AEROBIC AND ANAEROBIC Blood Culture adequate volume Performed at [facility], [address] [place], [place]    Culture   Final    NO GROWTH 5 DAYS Performed at [facility], [address] [place], [place]    Report Status 07/11/2019 FINAL  Final  Culture, blood (  routine x 2)     Status: None   Collection Time: 07/06/19  7:41 PM   Specimen: BLOOD  Result Value Ref Range Status   Specimen Description   Final    BLOOD LEFT ANTECUBITAL Performed at [facility], [address] [place], [place] [address]    Special Requests   Final    BOTTLES DRAWN AEROBIC AND ANAEROBIC Blood Culture adequate volume Performed at [facility], [address] [place], [place] [address]    Culture   Final    NO GROWTH 5 DAYS Performed at [facility], [address] [place], [place] [address]    Report Status 07/11/2019 FINAL  Final  Respiratory Panel by RT PCR (Flu A&B, Covid) - Nasopharyngeal Swab     Status: None   Collection Time: 07/06/19  8:39 PM   Specimen: Nasopharyngeal Swab  Result Value Ref Range Status   SARS Coronavirus 2 by RT PCR NEGATIVE NEGATIVE Final    Comment: (NOTE) SARS-CoV-2 target nucleic acids are NOT DETECTED. The SARS-CoV-2 RNA is generally detectable in upper respiratoy specimens during the acute phase of infection. The lowest concentration of SARS-CoV-2 viral copies this assay can detect is 131 copies/mL. A negative result does not preclude SARS-Cov-2 infection and should not be used as the sole basis for treatment or other patient management decisions. A negative result may occur with  improper specimen collection/handling,  submission of specimen other than nasopharyngeal swab, presence of viral mutation(s) within the areas targeted by this assay, and inadequate number of viral copies (<131 copies/mL). A negative result must be combined with clinical observations, patient history, and epidemiological information. The expected result is Negative. Fact Sheet for Patients:  https://www.moore.com/ Fact Sheet for Healthcare Providers:  https://www.young.biz/ This test is not yet ap proved or cleared by the [place] FDA and  has been authorized for detection and/or diagnosis of SARS-CoV-2 by FDA under an Emergency Use Authorization (EUA). This EUA will remain  in effect (meaning this test can be used) for the duration of the COVID-19 declaration under Section 564(b)(1) of the Act, 21 U.S.C. section 360bbb-3(b)(1), unless the authorization is terminated or revoked sooner.    Influenza A by PCR NEGATIVE NEGATIVE Final   Influenza B by PCR NEGATIVE NEGATIVE Final    Comment: (NOTE) The Xpert Xpress SARS-CoV-2/FLU/RSV assay is intended as an aid in  the diagnosis of influenza from Nasopharyngeal swab specimens and  should not be used as a sole basis for treatment. Nasal washings and  aspirates are unacceptable for Xpert Xpress SARS-CoV-2/FLU/RSV  testing. Fact Sheet for Patients: https://www.moore.com/ Fact Sheet for Healthcare Providers: https://www.young.biz/ This test is not yet approved or cleared by the [place] FDA and  has been authorized for detection and/or diagnosis of SARS-CoV-2 by  FDA under an Emergency Use Authorization (EUA). This EUA will remain  in effect (meaning this test can be used) for the duration of the  Covid-19 declaration under Section 564(b)(1) of the Act, 21  U.S.C. section 360bbb-3(b)(1), unless the authorization is  terminated or revoked. Performed at [facility], [address], [place], [place] [address]   Culture, blood (Routine X 2) w Reflex to ID Panel     Status: None (Preliminary result)   Collection Time: 07/09/19  9:25 AM   Specimen: BLOOD RIGHT HAND  Result Value Ref Range Status   Specimen Description   Final    BLOOD RIGHT HAND Performed at [facility], [address] [place], [place] [address]    Special Requests   Final    BOTTLES DRAWN AEROBIC AND ANAEROBIC Blood Culture adequate volume Performed at [facility], [address] [place], [place] [address]    Culture   Final    NO GROWTH 2 DAYS Performed at [facility], [address] [place], [place] [address]    Report Status PENDING  Incomplete  Culture, blood (Routine X 2) w Reflex to ID Panel     Status: None (Preliminary result)   Collection Time: 07/09/19  9:31 AM   Specimen: BLOOD LEFT HAND  Result Value Ref Range Status   Specimen Description   Final    BLOOD LEFT HAND Performed at [facility], [address] [place], [place] [address]    Special Requests   Final    BOTTLES DRAWN AEROBIC AND ANAEROBIC Blood Culture adequate volume Performed at [facility], [address] [place], [place] [address]    Culture   Final    NO GROWTH 2 DAYS Performed at [facility], [address] [place], [place] [address]    Report Status PENDING  Incomplete    Radiology Reports DG Chest 2 View  Result Date: 07/06/2019 CLINICAL DATA:  Jewel stent placement EXAM: CHEST - 2 VIEW COMPARISON:  January 22, 2019. FINDINGS: Esophageal stent is not present within the chest. Lungs are clear. Heart size and pulmonary vascular normal. No adenopathy. No pneumomediastinum or pneumothorax. There is aortic atherosclerosis. There is mild degenerative change in the thoracic spine. IMPRESSION: Stent is not present in the chest. No pneumomediastinum or pneumothorax. Lungs clear. Cardiac silhouette normal.  Aortic Atherosclerosis (ICD10-I70.0). Electronically Signed   By: Bretta BangWilliam  Woodruff III M.D.   On: 07/06/2019 16:31   CT ABDOMEN PELVIS W CONTRAST  Result Date: 07/06/2019 CLINICAL DATA:  Mild positioning of esophageal stent on abdominal x-ray performed earlier today, abdominal pain below gastrostomy tube EXAM: CT ABDOMEN AND PELVIS WITH CONTRAST TECHNIQUE: Multidetector CT imaging of the abdomen and pelvis was performed using the standard protocol following bolus administration of intravenous contrast. CONTRAST:  100mL OMNIPAQUE IOHEXOL 300 MG/ML  SOLN COMPARISON:  07/06/2019, 03/03/2019 FINDINGS: Lower chest: No acute pleural or parenchymal lung disease. Hepatobiliary: No focal liver abnormality is seen. Status post cholecystectomy. No biliary dilatation. Pancreas: Unremarkable. No pancreatic ductal dilatation or surrounding inflammatory changes. Spleen: Normal in size without focal abnormality. Adrenals/Urinary Tract: Adrenal glands are unremarkable. Kidneys are normal, without renal calculi, focal lesion, or hydronephrosis. Bladder is unremarkable. Stomach/Bowel: The dislodged esophageal stent is seen on abdominal x-ray earlier today is identified on this exam within the mid to distal jejunum. Proximal small bowel dilatation consistent with obstruction as result of the dislodged stent. There is bowel wall thickening and surrounding mesenteric fat stranding at the site of obstruction within the distal jejunum lower pelvis. No evidence of pneumatosis or bowel perforation at this time. Percutaneous gastrostomy tube is seen within the gastric lumen. No complications related to the gastrostomy tube site. Vascular/Lymphatic: Aortic atherosclerosis. No enlarged abdominal or pelvic lymph nodes. Reproductive: Prostate is unremarkable. Other: Mild mesenteric edema within the lower pelvis. No free fluid or free gas. Musculoskeletal: No acute or destructive bony lesions. Reconstructed images demonstrate no additional  findings. IMPRESSION: 1. Dislodged esophageal stent, seen within the distal jejunum. There is resulting wall thickening, surrounding fat stranding, and small bowel obstruction. Surgical consultation recommended. 2. Unremarkable percutaneous gastrostomy tube. Electronically Signed  By: Sharlet Salina M.D.   On: 07/06/2019 19:23   DG Abd 2 Views  Result Date: 07/06/2019 CLINICAL DATA:  Migrated esophageal stent EXAM: ABDOMEN - 2 VIEW COMPARISON:  None. FINDINGS: Esophageal stent is in the pelvis, either in a loop of distal small bowel or in the sigmoid colon. There is no free air. There are loops of borderline dilated small bowel with multiple air-fluid levels. There are surgical clips in the left upper abdomen. There is an apparent gastrostomy catheter in the stomach region. IMPRESSION: Esophageal stent has migrated into the pelvis, either within a loop of distal small bowel or sigmoid colon. Borderline dilatation of several loops of small bowel with multiple air-fluid levels. Question a degree of enteritis versus Vincent bowel obstruction. A degree of ileus could present similarly. No evident free air. Gastrostomy catheter in stomach region. With respect to location of the stent, correlation with CT may be helpful to assess for small bowel versus large bowel location. These results were called by telephone at the time of interpretation on 07/06/2019 at 4:30 pm to provider [facility] , who verbally acknowledged these results. Electronically Signed   By: Bretta Bang III M.D.   On: 07/06/2019 16:30   DG Abd Portable 1V  Result Date: 07/08/2019 CLINICAL DATA:  Migrated esophageal stent EXAM: PORTABLE ABDOMEN - 1 VIEW COMPARISON:  Radiograph 07/06/2019, CT 07/06/2019 FINDINGS: A an esophageal stent projects over the right hemipelvis in a similar location to that which was seen on the comparison CT and radiograph 1 day prior. Multiple air-filled loops of bowel in the abdomen are similar to the  comparison exam. These loops are at the upper limits of normal for size but without high-grade obstructive pattern at this time. Surgical clips present in the right upper quadrant. Percutaneous gastrostomy tube projects over the upper abdomen. IMPRESSION: Stable position of the migrated esophageal stent projecting over the pelvis. Scattered air-filled loops of bowel within the abdomen but without convincing features of high-grade obstruction. Electronically Signed   By: Kreg Shropshire M.D.   On: 07/08/2019 06:52    Lab Data:  CBC: Recent Labs  Lab 07/06/19 1346 07/06/19 1346 07/06/19 1800 07/06/19 2347 07/07/19 0313 07/08/19 0326 07/09/19 0400 07/10/19 0516 07/11/19 0427  WBC 18.4 Repeated and verified X2.*   < > 16.0*   < > 13.3* 7.7 16.5* 7.5 8.6  NEUTROABS 12.4*  --  10.8*  --   --   --   --   --   --   HGB 13.4   < > 12.8*   < > 12.3* 11.2* 11.9* 10.9* 11.0*  HCT 39.7   < > 38.2*   < > 37.9* 34.2* 35.4* 34.1* 32.8*  MCV 88.0   < > 90.3   < > 91.1 91.2 90.5 92.7 90.4  PLT 452.0*   < > 411*   < > 366 332 356 357 338   < > = values in this interval not displayed.   Basic Metabolic Panel: Recent Labs  Lab 07/08/19 0326 07/09/19 0400 07/09/19 1052 07/10/19 0741 07/11/19 0427  NA 144 142 141 140 139  K 3.3* 4.3 3.8 3.8 3.5  CL 108 112* 112* 111 111  CO2 25 22 21* 21* 22  GLUCOSE 97 142* 108* 94 94  BUN 11 12 12 8 6   CREATININE 0.81 0.86 0.80 0.70 0.76  CALCIUM 8.6* 8.6* 8.3* 8.4* 8.3*  MG  --  1.9  --   --   --  GFR: Estimated Creatinine Clearance: 95.3 mL/min (by C-G formula based on SCr of 0.76 mg/dL). Liver Function Tests: Recent Labs  Lab 07/06/19 1346 07/06/19 1800 07/07/19 0313 07/09/19 1052  AST 10 13* 11* 12*  ALT 8 11 12 11   ALKPHOS 115 107 101 76  BILITOT 0.4 0.5 0.7 0.5  PROT 7.1 6.9 6.6 6.0*  ALBUMIN 3.7 3.5 3.2* 2.8*   Recent Labs  Lab 07/06/19 1346 07/06/19 1800  LIPASE 11.0 18  AMYLASE 25*  --    No results for input(s): AMMONIA in the  last 168 hours. Coagulation Profile: No results for input(s): INR, PROTIME in the last 168 hours. Cardiac Enzymes: No results for input(s): CKTOTAL, CKMB, CKMBINDEX, TROPONINI in the last 168 hours. BNP (last 3 results) No results for input(s): PROBNP in the last 8760 hours. HbA1C: No results for input(s): HGBA1C in the last 72 hours. CBG: No results for input(s): GLUCAP in the last 168 hours. Lipid Profile: No results for input(s): CHOL, HDL, LDLCALC, TRIG, CHOLHDL, LDLDIRECT in the last 72 hours. Thyroid Function Tests: No results for input(s): TSH, T4TOTAL, FREET4, T3FREE, THYROIDAB in the last 72 hours. Anemia Panel: No results for input(s): VITAMINB12, FOLATE, FERRITIN, TIBC, IRON, RETICCTPCT in the last 72 hours. Urine analysis:    Component Value Date/Time   COLORURINE AMBER (A) 07/06/2019 2059   APPEARANCEUR CLEAR 07/06/2019 2059   LABSPEC 1.045 (H) 07/06/2019 2059   PHURINE 7.0 07/06/2019 2059   GLUCOSEU NEGATIVE 07/06/2019 2059   HGBUR NEGATIVE 07/06/2019 2059   BILIRUBINUR NEGATIVE 07/06/2019 2059   KETONESUR NEGATIVE 07/06/2019 2059   PROTEINUR NEGATIVE 07/06/2019 2059   UROBILINOGEN 1.0 12/19/2014 0646   NITRITE NEGATIVE 07/06/2019 2059   LEUKOCYTESUR NEGATIVE 07/06/2019 2059     Diavian Furgason M.D. [facility] 07/11/2019, 11:47 AM   Call night coverage person covering after 7pm
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
## 2019-07-12 ENCOUNTER — Inpatient Hospital Stay (HOSPITAL_COMMUNITY): Payer: Medicaid Other

## 2019-07-12 LAB — BASIC METABOLIC PANEL
Anion gap: 7 (ref 5–15)
BUN: 5 mg/dL — ABNORMAL LOW (ref 6–20)
CO2: 20 mmol/L — ABNORMAL LOW (ref 22–32)
Calcium: 8.2 mg/dL — ABNORMAL LOW (ref 8.9–10.3)
Chloride: 110 mmol/L (ref 98–111)
Creatinine, Ser: 0.82 mg/dL (ref 0.61–1.24)
GFR calc Af Amer: >60 mL/min (ref >60)
GFR calc non Af Amer: >60 mL/min (ref >60)
Glucose, Bld: 95 mg/dL (ref 70–99)
Potassium: 3.4 mmol/L — ABNORMAL LOW (ref 3.5–5.1)
Sodium: 137 mmol/L (ref 135–145)

## 2019-07-12 LAB — CBC
HCT: 33.2 % — ABNORMAL LOW (ref 39.0–52.0)
Hemoglobin: 10.8 g/dL — ABNORMAL LOW (ref 13.0–17.0)
MCH: 29.8 pg (ref 26.0–34.0)
MCHC: 32.5 g/dL (ref 30.0–36.0)
MCV: 91.7 fL (ref 80.0–100.0)
Platelets: 349 10*3/uL (ref 150–400)
RBC: 3.62 MIL/uL — ABNORMAL LOW (ref 4.22–5.81)
RDW: 12.7 % (ref 11.5–15.5)
WBC: 10.1 10*3/uL (ref 4.0–10.5)
nRBC: 0 % (ref 0.0–0.2)

## 2019-07-12 MED ORDER — MORPHINE SULFATE (PF) 2 MG/ML IV SOLN: 1.0000 mg | INTRAVENOUS | Status: DC | PRN

## 2019-07-12 MED ORDER — METHOCARBAMOL 500 MG PO TABS
1000.0000 mg | ORAL_TABLET | Freq: Four times a day (QID) | ORAL | Status: DC | PRN
  Administered 2019-07-12 – 2019-07-13 (×3): 1000 mg via ORAL
  Filled 2019-07-12 (×3): qty 2

## 2019-07-12 MED ORDER — OXYCODONE HCL 5 MG PO TABS
5.0000 mg | ORAL_TABLET | ORAL | Status: DC | PRN
  Administered 2019-07-12: 10 mg via ORAL
  Filled 2019-07-12: qty 2

## 2019-07-12 MED ORDER — POTASSIUM CHLORIDE 20 MEQ PO PACK
40.0000 meq | PACK | Freq: Once | ORAL | Status: AC
  Administered 2019-07-12: 40 meq
  Filled 2019-07-12: qty 2

## 2019-07-12 MED ORDER — OXYCODONE HCL 5 MG PO TABS
5.0000 mg | ORAL_TABLET | ORAL | Status: DC | PRN
  Administered 2019-07-12 – 2019-07-13 (×5): 5 mg via ORAL
  Filled 2019-07-12 (×5): qty 1

## 2019-07-12 MED ORDER — ACETAMINOPHEN 500 MG PO TABS
1000.0000 mg | ORAL_TABLET | Freq: Four times a day (QID) | ORAL | Status: DC
  Administered 2019-07-12 – 2019-07-13 (×4): 1000 mg via ORAL
  Filled 2019-07-12 (×4): qty 2

## 2019-07-12 MED ORDER — IOTHALAMATE MEGLUMINE 17.2 % UR SOLN
250.0000 mL | Freq: Once | URETHRAL | Status: AC | PRN
  Administered 2019-07-12: 250 mL via INTRAVESICAL

## 2019-07-12 NOTE — Plan of Care (Signed)
  Problem: Health Behavior/Discharge Planning: Goal: Ability to manage health-related needs will improve Outcome: Progressing   Problem: Clinical Measurements: Goal: Ability to maintain clinical measurements within normal limits will improve Outcome: Progressing Goal: Will remain free from infection Outcome: Progressing   Problem: Activity: Goal: Risk for activity intolerance will decrease Outcome: Progressing   Problem: Nutrition: Goal: Adequate nutrition will be maintained Outcome: Progressing   Problem: Elimination: Goal: Will not experience complications related to bowel motility Outcome: Progressing Goal: Will not experience complications related to urinary retention Outcome: Progressing   Problem: Pain Managment: Goal: General experience of comfort will improve Outcome: Progressing   

## 2019-07-12 NOTE — Progress Notes (Signed)
[employer] Engineer, maintenance documentation:  The patient stated his pain was an aching pain in his lower abdomen rated at an 8/10. 10mg  of Oxycodone was given po. After an hour the patient stated that his pain level was a 5/10. By 1300 the patient stated that his pain level was a 3/10.   Patient was educated on the importance of drinking plenty of fluids and increasing mobility to enhance bowel motility.
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
## 2019-07-12 NOTE — Plan of Care (Addendum)
The patient stated his pain was an aching pain in his lower abdomen rated at an 8/10. 10mg  of Oxycodone was given po. After an hour the patient stated that his pain level was a 5/10. By 1300 the patient stated that his pain level was a 3/10.   Patient was educated on the importance of drinking plenty of fluids and increasing mobility to enhance bowel motility.

## 2019-07-12 NOTE — Progress Notes (Signed)
[facility]                                                                              Patient Demographics  Vincent Clarke, is a 54 y.o. male, DOB - Jan 17, 1966, [id number]  Admit date - 07/06/2019   Admitting Physician Edmonia Lynch, DO  Outpatient Primary MD for the patient is [name], MD  Outpatient specialists:   LOS - 6  days   Medical records reviewed and are as summarized below:    Chief Complaint  Patient presents with  . 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  Pain also associated with nausea and vomiting, poor appetite.  Patient also has PEG tube for supplementation but not sure if it was used for feeding.  Patient contacted his GI physician because of severe pain and imaging showed that stent was not in esophagus and was recommended to go to ED.  CT abdomen pelvis showed dislodged esophageal stent that was seen in distal jejunum, evidence of small bowel obstruction but no perforation.  Patient was admitted for further management, GI and general surgery were consulted. Status post laparoscopic removal of the foreign body from small intestine, open small bowel resection.   Assessment & Plan    Principal Problem: Abdominal pain, secondary to SBO from esophageal stent migration -Patient presented with progressive abdominal pain, nausea vomiting.  Imaging confirmed dislodgment of his esophageal stent and was directed to ED. -CT abdomen pelvis showed dislodged esophageal stent and distal jejunum with resultant wall thickening and fat stranding with associated SBO. -General surgery, GI consulted. -Underwent laparoscopic assisted removal of the foreign body, postop day # 4 -Pain is much better now, will start decreasing pain medications.  Goal is  to get back on Suboxone at the time of discharge.  Patient receives Suboxone from the family medicine clinic at [facility].  -Diet advanced to full liquid diet, continue incentive spirometry -Plan for cystogram today  Hypokalemia Potassium 3.4, replaced  Essential hypertension -BP improving, continue IV Vasotec as needed  Polysubstance abuse - UDS + amphetamines and opiates -Holding Suboxone for now, patient will resume at discharge.  History of esophagitis -Continue Pepcid, Protonix  Nicotine use Continue nicotine patch  Constipation Continue Dulcolax supp, added Colace   Code Status: full  DVT Prophylaxis:  Lovenox  Family Communication: Discussed all imaging results, lab results, explained to the patient    Disposition Plan: Patient from home, anticipated discharge to home once resolution of the SBO and patient tolerating diet and cleared by general surgery.  Hopefully next 24 to 48 hours.  Time Spent in minutes 25 minutes  Procedures:  Laparoscopic assisted removal of foreign body from the small intestine Cystogram 3/2  Consultants:   General surgery GI  Antimicrobials:   Anti-infectives (From admission, onward)   Start     Dose/Rate Route Frequency Ordered Stop   07/07/19 0100  piperacillin-tazobactam (ZOSYN) IVPB 3.375 g     3.375  g 12.5 mL/hr over 240 Minutes Intravenous Every 8 hours 07/06/19 2318 07/12/19 2359   07/06/19 2015  piperacillin-tazobactam (ZOSYN) IVPB 3.375 g     3.375 g 100 mL/hr over 30 Minutes Intravenous  Once 07/06/19 2006 07/06/19 2204         Medications  Scheduled Meds: . acetaminophen  1,000 mg Oral Q6H  . bisacodyl  10 mg Rectal Daily  . Chlorhexidine Gluconate Cloth  6 each Topical Daily  . docusate sodium  100 mg Oral BID  . enoxaparin (LOVENOX) injection  40 mg Subcutaneous QHS  . lip balm  1 application Topical BID  . nicotine  21 mg Transdermal Daily  . pantoprazole (PROTONIX) IV  40 mg Intravenous QHS   Continuous  Infusions: . famotidine (PEPCID) IV 20 mg (07/12/19 1259)  . ondansetron (ZOFRAN) IV    . piperacillin-tazobactam (ZOSYN)  IV 3.375 g (07/12/19 0604)   PRN Meds:.diphenhydrAMINE, enalaprilat, magic mouthwash, methocarbamol, metoprolol tartrate, morphine injection, ondansetron (ZOFRAN) IV **OR** ondansetron (ZOFRAN) IV, oxyCODONE, prochlorperazine      Subjective:   Vincent Clarke was seen and examined today.  States abdominal pain is much better controlled.  Wants to advance diet tates, no nausea or vomiting no fevers or chills.  States had a BM yesterday.   No chest pain or shortness of breath or headache.  No acute events overnight  Objective:   Vitals:   07/11/19 2149 07/12/19 0535 07/12/19 0800 07/12/19 1318  BP: 135/81 136/88 131/78 136/82  Pulse: (!) 58 (!) 54 (!) 54 65  Resp: 18 18 18 17   Temp: 98 F (36.7 C) 98.2 F (36.8 C) 98 F (36.7 C) 98 F (36.7 C)  TempSrc: Oral Oral Oral Oral  SpO2: 97% 97% 96% 98%  Weight:      Height:        Intake/Output Summary (Last 24 hours) at 07/12/2019 1322 Last data filed at 07/12/2019 1216 Gross per 24 hour  Intake 4217.54 ml  Output 4575 ml  Net -357.46 ml     Wt Readings from Last 3 Encounters:  07/07/19 69.3 kg  05/30/19 73.9 kg  05/19/19 73.9 kg   Physical Exam  General: Alert and oriented x 3, NAD  Cardiovascular: S1 S2 clear, RRR. No pedal edema b/l  Respiratory: CTAB, no wheezing, rales or rhonchi  Gastrointestinal: Soft, NBS, G-tube, appropriately tender  Ext: no pedal edema bilaterally  Neuro: no new deficits  Musculoskeletal: No cyanosis, clubbing  Skin: No rashes  Psych: Normal affect and demeanor, alert and oriented x3      Data Reviewed:  I have personally reviewed following labs and imaging studies  Micro Results Recent Results (from the past 240 hour(s))  Culture, blood (routine x 2)     Status: None   Collection Time: 07/06/19  7:40 PM   Specimen: BLOOD  Result Value Ref Range Status    Specimen Description   Final    BLOOD RIGHT ANTECUBITAL Performed at [facility], [address], [place], [place]    Special Requests   Final    BOTTLES DRAWN AEROBIC AND ANAEROBIC Blood Culture adequate volume Performed at [facility], [address] [place], [place]    Culture   Final    NO GROWTH 5 DAYS Performed at [facility], [address], [place], [place]    Report Status 07/11/2019 FINAL  Final  Culture, blood (routine x 2)     Status: None  Collection Time: 07/06/19  7:41 PM   Specimen: BLOOD  Result Value Ref Range Status   Specimen Description   Final    BLOOD LEFT ANTECUBITAL Performed at [facility], [address] [place], [place] [address]    Special Requests   Final    BOTTLES DRAWN AEROBIC AND ANAEROBIC Blood Culture adequate volume Performed at [facility], [address] [place], [place] [address]    Culture   Final    NO GROWTH 5 DAYS Performed at [facility], [address] [place], [place] [address]    Report Status 07/11/2019 FINAL  Final  Respiratory Panel by RT PCR (Flu A&B, Covid) - Nasopharyngeal Swab     Status: None   Collection Time: 07/06/19  8:39 PM   Specimen: Nasopharyngeal Swab  Result Value Ref Range Status   SARS Coronavirus 2 by RT PCR NEGATIVE NEGATIVE Final    Comment: (NOTE) SARS-CoV-2 target nucleic acids are NOT DETECTED. The SARS-CoV-2 RNA is generally detectable in upper respiratoy specimens during the acute phase of infection. The lowest concentration of SARS-CoV-2 viral copies this assay can detect is 131 copies/mL. A negative result does not preclude SARS-Cov-2 infection and should not be used as the sole basis for treatment or other patient management decisions. A negative result may occur with  improper specimen collection/handling, submission of specimen other than nasopharyngeal swab,  presence of viral mutation(s) within the areas targeted by this assay, and inadequate number of viral copies (<131 copies/mL). A negative result must be combined with clinical observations, patient history, and epidemiological information. The expected result is Negative. Fact Sheet for Patients:  https://www.moore.com/https://www.fda.gov/media/142436/download Fact Sheet for Healthcare Providers:  https://www.young.biz/https://www.fda.gov/media/142435/download This test is not yet ap proved or cleared by the [place] FDA and  has been authorized for detection and/or diagnosis of SARS-CoV-2 by FDA under an Emergency Use Authorization (EUA). This EUA will remain  in effect (meaning this test can be used) for the duration of the COVID-19 declaration under Section 564(b)(1) of the Act, 21 U.S.C. section 360bbb-3(b)(1), unless the authorization is terminated or revoked sooner.    Influenza A by PCR NEGATIVE NEGATIVE Final   Influenza B by PCR NEGATIVE NEGATIVE Final    Comment: (NOTE) The Xpert Xpress SARS-CoV-2/FLU/RSV assay is intended as an aid in  the diagnosis of influenza from Nasopharyngeal swab specimens and  should not be used as a sole basis for treatment. Nasal washings and  aspirates are unacceptable for Xpert Xpress SARS-CoV-2/FLU/RSV  testing. Fact Sheet for Patients: https://www.moore.com/https://www.fda.gov/media/142436/download Fact Sheet for Healthcare Providers: https://www.young.biz/https://www.fda.gov/media/142435/download This test is not yet approved or cleared by the [place] FDA and  has been authorized for detection and/or diagnosis of SARS-CoV-2 by  FDA under an Emergency Use Authorization (EUA). This EUA will remain  in effect (meaning this test can be used) for the duration of the  Covid-19 declaration under Section 564(b)(1) of the Act, 21  U.S.C. section 360bbb-3(b)(1), unless the authorization is  terminated or revoked. Performed at [facility], [address] [place], [place] [address]   Culture, blood  (Routine X 2) w Reflex to ID Panel     Status: None (Preliminary result)   Collection Time: 07/09/19  9:25 AM   Specimen: BLOOD RIGHT HAND  Result Value Ref Range Status   Specimen Description   Final    BLOOD RIGHT HAND Performed at [facility], [address] [place], [place] [address]  Special Requests   Final    BOTTLES DRAWN AEROBIC AND ANAEROBIC Blood Culture adequate volume Performed at [facility], [address] [place], [place] [address]    Culture   Final    NO GROWTH 3 DAYS Performed at [facility], [address], [place], [place] [address]    Report Status PENDING  Incomplete  Culture, blood (Routine X 2) w Reflex to ID Panel     Status: None (Preliminary result)   Collection Time: 07/09/19  9:31 AM   Specimen: BLOOD LEFT HAND  Result Value Ref Range Status   Specimen Description   Final    BLOOD LEFT HAND Performed at [facility], [address] [place], [place] [address]    Special Requests   Final    BOTTLES DRAWN AEROBIC AND ANAEROBIC Blood Culture adequate volume Performed at [facility], [address] [place], [place] [address]    Culture   Final    NO GROWTH 3 DAYS Performed at [facility], [address] [place], [place] [address]    Report Status PENDING  Incomplete    Radiology Reports DG Chest 2 View  Result Date: 07/06/2019 CLINICAL DATA:  Jewel stent placement EXAM: CHEST - 2 VIEW COMPARISON:  January 22, 2019. FINDINGS: Esophageal stent is not present within the chest. Lungs are clear. Heart size and pulmonary vascular normal. No adenopathy. No pneumomediastinum or pneumothorax. There is aortic atherosclerosis. There is mild degenerative change in the thoracic spine. IMPRESSION: Stent is not present in the chest. No pneumomediastinum or pneumothorax. Lungs clear. Cardiac silhouette normal. Aortic Atherosclerosis (ICD10-I70.0). Electronically Signed    By: Bretta Bang III M.D.   On: 07/06/2019 16:31   CT ABDOMEN PELVIS W CONTRAST  Result Date: 07/06/2019 CLINICAL DATA:  Mild positioning of esophageal stent on abdominal x-ray performed earlier today, abdominal pain below gastrostomy tube EXAM: CT ABDOMEN AND PELVIS WITH CONTRAST TECHNIQUE: Multidetector CT imaging of the abdomen and pelvis was performed using the standard protocol following bolus administration of intravenous contrast. CONTRAST:  OMNIPAQUE IOHEXOL 300 MG/ML  SOLN COMPARISON:  07/06/2019, 03/03/2019 FINDINGS: Lower chest: No acute pleural or parenchymal lung disease. Hepatobiliary: No focal liver abnormality is seen. Status post cholecystectomy. No biliary dilatation. Pancreas: Unremarkable. No pancreatic ductal dilatation or surrounding inflammatory changes. Spleen: Normal in size without focal abnormality. Adrenals/Urinary Tract: Adrenal glands are unremarkable. Kidneys are normal, without renal calculi, focal lesion, or hydronephrosis. Bladder is unremarkable. Stomach/Bowel: The dislodged esophageal stent is seen on abdominal x-ray earlier today is identified on this exam within the mid to distal jejunum. Proximal small bowel dilatation consistent with obstruction as result of the dislodged stent. There is bowel wall thickening and surrounding mesenteric fat stranding at the site of obstruction within the distal jejunum lower pelvis. No evidence of pneumatosis or bowel perforation at this time. Percutaneous gastrostomy tube is seen within the gastric lumen. No complications related to the gastrostomy tube site. Vascular/Lymphatic: Aortic atherosclerosis. No enlarged abdominal or pelvic lymph nodes. Reproductive: Prostate is unremarkable. Other: Mild mesenteric edema within the lower pelvis. No free fluid or free gas. Musculoskeletal: No acute or destructive bony lesions. Reconstructed images demonstrate no additional findings. IMPRESSION: 1. Dislodged esophageal stent, seen  within the distal jejunum. There is resulting wall thickening, surrounding fat stranding, and small bowel obstruction. Surgical consultation recommended. 2. Unremarkable percutaneous gastrostomy tube. Electronically Signed   By: Sharlet Salina M.D.   On: 07/06/2019 19:23  DG Cystogram  Result Date: 07/12/2019 CLINICAL DATA:  Bladder injury, postop EXAM: CYSTOGRAM TECHNIQUE: After catheterization of the urinary bladder following sterile technique the bladder was filled with 250 mL Cysto-Hypaque 30% by drip infusion. Serial spot images were obtained during bladder filling and post draining. FLUOROSCOPY TIME:  Fluoroscopy Time:  1.4 minutes Radiation Exposure Index (if provided by the fluoroscopic device): 15.3 mGy Number of Acquired Spot Images: 0 COMPARISON:  CT 07/06/2019 FINDINGS: The bladder was filled with contrast through the indwelling Foley catheter. No evidence of contrast leak/extravasation. IMPRESSION: No visible contrast extravasation from the bladder. Electronically Signed   By: Charlett Nose M.D.   On: 07/12/2019 10:20   DG Abd 2 Views  Result Date: 07/06/2019 CLINICAL DATA:  Migrated esophageal stent EXAM: ABDOMEN - 2 VIEW COMPARISON:  None. FINDINGS: Esophageal stent is in the pelvis, either in a loop of distal small bowel or in the sigmoid colon. There is no free air. There are loops of borderline dilated small bowel with multiple air-fluid levels. There are surgical clips in the left upper abdomen. There is an apparent gastrostomy catheter in the stomach region. IMPRESSION: Esophageal stent has migrated into the pelvis, either within a loop of distal small bowel or sigmoid colon. Borderline dilatation of several loops of small bowel with multiple air-fluid levels. Question a degree of enteritis versus early bowel obstruction. A degree of ileus could present similarly. No evident free air. Gastrostomy catheter in stomach region. With respect to location of the stent, correlation with CT may be  helpful to assess for small bowel versus large bowel location. These results were called by telephone at the time of interpretation on 07/06/2019 at 4:30 pm to provider [facility] , who verbally acknowledged these results. Electronically Signed   By: Bretta Bang III M.D.   On: 07/06/2019 16:30   DG Abd Portable 1V  Result Date: 07/08/2019 CLINICAL DATA:  Migrated esophageal stent EXAM: PORTABLE ABDOMEN - 1 VIEW COMPARISON:  Radiograph 07/06/2019, CT 07/06/2019 FINDINGS: A an esophageal stent projects over the right hemipelvis in a similar location to that which was seen on the comparison CT and radiograph 1 day prior. Multiple air-filled loops of bowel in the abdomen are similar to the comparison exam. These loops are at the upper limits of normal for size but without high-grade obstructive pattern at this time. Surgical clips present in the right upper quadrant. Percutaneous gastrostomy tube projects over the upper abdomen. IMPRESSION: Stable position of the migrated esophageal stent projecting over the pelvis. Scattered air-filled loops of bowel within the abdomen but without convincing features of high-grade obstruction. Electronically Signed   By: Kreg Shropshire M.D.   On: 07/08/2019 06:52    Lab Data:  CBC: Recent Labs  Lab 07/06/19 1346 07/06/19 1346 07/06/19 1800 07/06/19 2347 07/08/19 0326 07/09/19 0400 07/10/19 0516 07/11/19 0427 07/12/19 0338  WBC 18.4 Repeated and verified X2.*   < > 16.0*   < > 7.7 16.5* 7.5 8.6 10.1  NEUTROABS 12.4*  --  10.8*  --   --   --   --   --   --   HGB 13.4   < > 12.8*   < > 11.2* 11.9* 10.9* 11.0* 10.8*  HCT 39.7   < > 38.2*   < > 34.2* 35.4* 34.1* 32.8* 33.2*  MCV 88.0   < > 90.3   < > 91.2 90.5 92.7 90.4 91.7  PLT 452.0*   < > 411*   < >  332 356 357 338 349   < > = values in this interval not displayed.   Basic Metabolic Panel: Recent Labs  Lab 07/09/19 0400 07/09/19 1052 07/10/19 0741 07/11/19 0427 07/11/19 1504  07/12/19 0338  NA 142 141 140 139  --  137  K 4.3 3.8 3.8 3.5  --  3.4*  CL 112* 112* 111 111  --  110  CO2 22 21* 21* 22  --  20*  GLUCOSE 142* 108* 94 94  --  95  BUN 12 12 8 6   --  5*  CREATININE 0.86 0.80 0.70 0.76  --  0.82  CALCIUM 8.6* 8.3* 8.4* 8.3*  --  8.2*  MG 1.9  --   --   --  1.8  --    GFR: Estimated Creatinine Clearance: 92.9 mL/min (by C-G formula based on SCr of 0.82 mg/dL). Liver Function Tests: Recent Labs  Lab 07/06/19 1346 07/06/19 1800 07/07/19 0313 07/09/19 1052  AST 10 13* 11* 12*  ALT 8 11 12 11   ALKPHOS 115 107 101 76  BILITOT 0.4 0.5 0.7 0.5  PROT 7.1 6.9 6.6 6.0*  ALBUMIN 3.7 3.5 3.2* 2.8*   Recent Labs  Lab 07/06/19 1346 07/06/19 1800  LIPASE 11.0 18  AMYLASE 25*  --    No results for input(s): AMMONIA in the last 168 hours. Coagulation Profile: No results for input(s): INR, PROTIME in the last 168 hours. Cardiac Enzymes: No results for input(s): CKTOTAL, CKMB, CKMBINDEX, TROPONINI in the last 168 hours. BNP (last 3 results) No results for input(s): PROBNP in the last 8760 hours. HbA1C: No results for input(s): HGBA1C in the last 72 hours. CBG: No results for input(s): GLUCAP in the last 168 hours. Lipid Profile: No results for input(s): CHOL, HDL, LDLCALC, TRIG, CHOLHDL, LDLDIRECT in the last 72 hours. Thyroid Function Tests: No results for input(s): TSH, T4TOTAL, FREET4, T3FREE, THYROIDAB in the last 72 hours. Anemia Panel: No results for input(s): VITAMINB12, FOLATE, FERRITIN, TIBC, IRON, RETICCTPCT in the last 72 hours. Urine analysis:    Component Value Date/Time   COLORURINE AMBER (A) 07/06/2019 2059   APPEARANCEUR CLEAR 07/06/2019 2059   LABSPEC 1.045 (H) 07/06/2019 2059   PHURINE 7.0 07/06/2019 2059   GLUCOSEU NEGATIVE 07/06/2019 2059   HGBUR NEGATIVE 07/06/2019 2059   BILIRUBINUR NEGATIVE 07/06/2019 2059   KETONESUR NEGATIVE 07/06/2019 2059   PROTEINUR NEGATIVE 07/06/2019 2059   UROBILINOGEN 1.0 12/19/2014 0646    NITRITE NEGATIVE 07/06/2019 2059   LEUKOCYTESUR NEGATIVE 07/06/2019 2059     Quintara Bost M.D. [facility] 07/12/2019, 1:22 PM   Call night coverage person covering after 7pm

## 2019-07-13 DIAGNOSIS — R1010 Upper abdominal pain, unspecified: Secondary | ICD-10-CM

## 2019-07-13 DIAGNOSIS — K2270 Barrett's esophagus without dysplasia: Secondary | ICD-10-CM

## 2019-07-13 DIAGNOSIS — K219 Gastro-esophageal reflux disease without esophagitis: Secondary | ICD-10-CM

## 2019-07-13 DIAGNOSIS — F1520 Other stimulant dependence, uncomplicated: Secondary | ICD-10-CM

## 2019-07-13 DIAGNOSIS — F1121 Opioid dependence, in remission: Secondary | ICD-10-CM

## 2019-07-13 DIAGNOSIS — I10 Essential (primary) hypertension: Secondary | ICD-10-CM

## 2019-07-13 DIAGNOSIS — K226 Gastro-esophageal laceration-hemorrhage syndrome: Secondary | ICD-10-CM

## 2019-07-13 DIAGNOSIS — T85528D Displacement of other gastrointestinal prosthetic devices, implants and grafts, subsequent encounter: Secondary | ICD-10-CM

## 2019-07-13 LAB — BASIC METABOLIC PANEL
Anion gap: 7 (ref 5–15)
BUN: 6 mg/dL (ref 6–20)
CO2: 21 mmol/L — ABNORMAL LOW (ref 22–32)
Calcium: 8.7 mg/dL — ABNORMAL LOW (ref 8.9–10.3)
Chloride: 112 mmol/L — ABNORMAL HIGH (ref 98–111)
Creatinine, Ser: 0.82 mg/dL (ref 0.61–1.24)
GFR calc Af Amer: >60 mL/min (ref >60)
GFR calc non Af Amer: >60 mL/min (ref >60)
Glucose, Bld: 90 mg/dL (ref 70–99)
Potassium: 3.6 mmol/L (ref 3.5–5.1)
Sodium: 140 mmol/L (ref 135–145)

## 2019-07-13 LAB — CBC
HCT: 32.9 % — ABNORMAL LOW (ref 39.0–52.0)
Hemoglobin: 11 g/dL — ABNORMAL LOW (ref 13.0–17.0)
MCH: 30 pg (ref 26.0–34.0)
MCHC: 33.4 g/dL (ref 30.0–36.0)
MCV: 89.6 fL (ref 80.0–100.0)
Platelets: 348 10*3/uL (ref 150–400)
RBC: 3.67 MIL/uL — ABNORMAL LOW (ref 4.22–5.81)
RDW: 12.6 % (ref 11.5–15.5)
WBC: 9.2 10*3/uL (ref 4.0–10.5)
nRBC: 0 % (ref 0.0–0.2)

## 2019-07-13 MED ORDER — PANTOPRAZOLE SODIUM 40 MG PO TBEC
40.0000 mg | DELAYED_RELEASE_TABLET | Freq: Every day | ORAL | Status: DC
  Administered 2019-07-13: 40 mg via ORAL
  Filled 2019-07-13: qty 1

## 2019-07-13 MED ORDER — OXYCODONE HCL 5 MG PO TABS: 5.0000 mg | ORAL_TABLET | Freq: Four times a day (QID) | ORAL | 0 refills | Status: DC | PRN

## 2019-07-13 MED ORDER — DIPHENHYDRAMINE HCL 12.5 MG/5ML PO ELIX: 12.5000 mg | ORAL_SOLUTION | Freq: Four times a day (QID) | ORAL | Status: DC | PRN

## 2019-07-13 MED ORDER — FAMOTIDINE 20 MG PO TABS
20.0000 mg | ORAL_TABLET | Freq: Two times a day (BID) | ORAL | Status: DC
  Administered 2019-07-13: 20 mg via ORAL
  Filled 2019-07-13: qty 1

## 2019-07-13 MED ORDER — METHOCARBAMOL 500 MG PO TABS: 1000.0000 mg | ORAL_TABLET | Freq: Four times a day (QID) | ORAL | 0 refills | Status: DC | PRN

## 2019-07-13 MED ORDER — ACETAMINOPHEN 500 MG PO TABS: 1000.0000 mg | ORAL_TABLET | Freq: Four times a day (QID) | ORAL | 0 refills | Status: AC | PRN

## 2019-07-13 MED FILL — oxyCODONE HCL 5 MG TABS: 5 | 7 days supply | Qty: 25 | Fill #0

## 2019-07-13 MED FILL — METHOCARBAMOL 500 MG TABS: 500 | 4 days supply | Qty: 30 | Fill #0

## 2019-07-13 NOTE — Discharge Summary (Signed)
Physician Discharge Summary  Vincent CanFred B Clarke RUE:[id number]:[id number] DOB: 11/22/1965 DOA: 07/06/2019  PCP: Reymundo PollGuilloud, Carolyn, MD  Admit date: 07/06/2019 Discharge date: 07/13/2019  Admitted From: home Disposition:  home  Recommendations for Outpatient Follow-up:  1. Follow up with PCP in 1-2 weeks  Home Health: none Equipment/Devices: none  Discharge Condition: stable CODE STATUS: Full code Diet recommendation: liquid diet by mouth, PEG tube supplements  HPI: Per admitting MD, Vincent CanFred B Paz is a 54 y.o. male with medical history significant of hypertension, polysubstance abuse and stenosis of esophagus s/p stent placement presented to ED for evaluation of abdominal pain for the last 3 days.  Patient states that pain started 3 days ago and is located around his bellybutton, colicky in nature, 10 out of 10 on pain scale and radiates to his back.  Pain is also associated with nausea and vomiting.  Patient also admits of poor appetite secondary to nausea and vomiting.  Patient also has a PEG tube for supplementation but he is not sure if it is used for feeding.  Patient states that that he contacted his GI doctor because of severe pain and image showed that the stent was not in the esophagus so his gastroenterologist recommended him to go to the ED for evaluation of abdominal pain due to possible stent migration.  Patient denies fever, chills, chest pain, palpitations, shortness of breath, diarrhea, melena, blood in the stool and urinary frequency patient states that his last bowel movement was today and he also admits of passing flatus. ED Course: In ED on arrival patient had temperature of 98 degrees F, blood pressure 95/69, heart rate 87, respiratory rate 20 and oxygen saturation 96% on room air.  Blood work showed WBC count of 16 while rest of the blood work was within normal limits./Pelvis was done that showed dislodged esophageal stent that was seen in distal jejunum there was also evidence of small bowel  obstruction but no evidence of bowel perforation.  The ED physician discussed the case with gastroenterologist on-call and he recommended to consult the surgery and start the patient on IV Zosyn.  Dr. Josem Kaufmannross/surgery was consulted who recommends that patient should be admitted and GI and surgery will follow up with the patient.  Hospital Course / Discharge diagnoses:  Principal Problem: Abdominal pain, secondary to SBO from esophageal stent migration -Patient presented with progressive abdominal pain, nausea vomiting.  Imaging confirmed dislodgment of his esophageal stent and was directed to  ED. CT abdomen pelvis showed dislodged esophageal stent and distal jejunum with resultant wall thickening and fat stranding with associated SBO. General surgery, GI consulted. Underwent laparoscopic assisted removal of the foreign body.  He recovered well, his diet was advanced to full liquids, his pain improved significantly, and he is to be discharged home in stable condition with outpatient follow-up.  Hypokalemia replaced Essential hypertension -resume home medications  Polysubstance abuse - UDS + amphetamines and opiates.  He will follow up with his outpatient Suboxone clinic, pain improved on oxycodone, given a short prescription by surgery prior to discharge History of esophagitis -PPI Nicotine use counseled   Discharge Instructions   Allergies as of 07/13/2019      Reactions   Flexeril [cyclobenzaprine] Swelling   Facial swelling   Tramadol Swelling      Medication List    STOP taking these medications   citalopram 10 MG tablet Commonly known as: CeleXA   fluconazole 200 MG tablet Commonly known as: DIFLUCAN     TAKE these medications  acetaminophen 500 MG tablet Commonly known as: TYLENOL Take 2 tablets (1,000 mg total) by mouth every 6 (six) hours as needed.   AMBULATORY NON FORMULARY MEDICATION GI cocktail - 90ml Viscous Lidocaine,90ml-10mg /715ml Dicyclomine, 270ml Maalox.  Swish  and Swalow 30ml by mouth four times daily.   Bupreno47rphine HCl-Naloxone HCl 8-2 MG Film Place 1 Film under the tongue 2 (two) times daily.   feeding supplement (JEVITY 1.5 CAL/FIBER) Liqd Place 300 mLs into feeding tube 4 (four) times daily. What changed:   how much to take  when to take this   feeding supplement (PRO-STAT SUGAR FREE 64) Liqd Place 30 mLs into feeding tube daily.   free water Soln Place 200 mLs into feeding tube 4 (four) times daily.   lisinopril 20 MG tablet Commonly known as: ZESTRIL Take 1 tablet (20 mg total) by mouth daily.   methocarbamol 500 MG tablet Commonly known as: ROBAXIN Take 2 tablets (1,000 mg total) by mouth every 6 (six) hours as needed for muscle spasms.   multivitamin with minerals Tabs tablet Take 1 tablet by mouth daily.   oxyCODONE 5 MG immediate release tablet Commonly known as: Oxy IR/ROXICODONE Take 1 tablet (5 mg total) by mouth every 6 (six) hours as needed for moderate pain.   pantoprazole 40 MG tablet Commonly known as: PROTONIX Take 40 mg by mouth 2 (two) times daily.   traZODone 50 MG tablet Commonly known as: DESYREL Take 0.5-1 tablets (25-50 mg total) by mouth at bedtime as needed for sleep.      Follow-up Information    Armbruster, Willaim RayasSteven P, MD Follow up in 2 week(s).   Specialty: Gastroenterology Contact information: [address] [place] [address] [phone]        Griselda Mineroth, Paul III, MD Follow up on 08/02/2019.   Specialty: General Surgery Why: 10:00am, arrive 9:45am Contact information: [address] [place] [address] [phone]        [facility] [place] Follow up on 07/18/2019.   Specialty: General Surgery Why: 10:00am, arrive by 9:30am for paperwork and checkin process.  Nurse only visit for staple removal Contact information: [address] [place] [address] [phone]           Consultations:  GI  General surgery    Procedures/Studies: Laparoscopic assisted removal of foreign body from the small intestine Cystogram 3/2  DG Chest 2 View  Result Date: 07/06/2019 CLINICAL DATA:  Jewel stent placement EXAM: CHEST - 2 VIEW COMPARISON:  January 22, 2019. FINDINGS: Esophageal stent is not present within the chest. Lungs are clear. Heart size and pulmonary vascular normal. No adenopathy. No pneumomediastinum or pneumothorax. There is aortic atherosclerosis. There is mild degenerative change in the thoracic spine. IMPRESSION: Stent is not present in the chest. No pneumomediastinum or pneumothorax. Lungs clear. Cardiac silhouette normal. Aortic Atherosclerosis (ICD10-I70.0). Electronically Signed   By: Bretta BangWilliam  Woodruff III M.D.   On: 07/06/2019 16:31   CT ABDOMEN PELVIS W CONTRAST  Result Date: 07/06/2019 CLINICAL DATA:  Mild positioning of esophageal stent on abdominal x-ray performed earlier today, abdominal pain below gastrostomy tube EXAM: CT ABDOMEN AND PELVIS WITH CONTRAST TECHNIQUE: Multidetector CT imaging of the abdomen and pelvis was performed using the standard protocol following bolus administration of intravenous contrast. CONTRAST:  100mL OMNIPAQUE IOHEXOL 300 MG/ML  SOLN COMPARISON:  07/06/2019, 03/03/2019 FINDINGS: Lower chest: No acute pleural or parenchymal lung disease. Hepatobiliary: No focal liver abnormality is seen. Status post cholecystectomy. No  biliary dilatation. Pancreas: Unremarkable. No pancreatic ductal dilatation or surrounding inflammatory changes. Spleen: Normal in size without focal abnormality. Adrenals/Urinary Tract: Adrenal glands are unremarkable. Kidneys are normal, without renal calculi, focal lesion, or hydronephrosis. Bladder is unremarkable. Stomach/Bowel: The dislodged esophageal stent is seen on abdominal x-ray earlier today is identified on this exam within the mid to distal jejunum. Proximal small bowel dilatation consistent with obstruction as result of the dislodged  stent. There is bowel wall thickening and surrounding mesenteric fat stranding at the site of obstruction within the distal jejunum lower pelvis. No evidence of pneumatosis or bowel perforation at this time. Percutaneous gastrostomy tube is seen within the gastric lumen. No complications related to the gastrostomy tube site. Vascular/Lymphatic: Aortic atherosclerosis. No enlarged abdominal or pelvic lymph nodes. Reproductive: Prostate is unremarkable. Other: Mild mesenteric edema within the lower pelvis. No free fluid or free gas. Musculoskeletal: No acute or destructive bony lesions. Reconstructed images demonstrate no additional findings. IMPRESSION: 1. Dislodged esophageal stent, seen within the distal jejunum. There is resulting wall thickening, surrounding fat stranding, and small bowel obstruction. Surgical consultation recommended. 2. Unremarkable percutaneous gastrostomy tube. Electronically Signed   By: Sharlet Salina M.D.   On: 07/06/2019 19:23   DG Cystogram  Result Date: 07/12/2019 CLINICAL DATA:  Bladder injury, postop EXAM: CYSTOGRAM TECHNIQUE: After catheterization of the urinary bladder following sterile technique the bladder was filled with 250 mL Cysto-Hypaque 30% by drip infusion. Serial spot images were obtained during bladder filling and post draining. FLUOROSCOPY TIME:  Fluoroscopy Time:  1.4 minutes Radiation Exposure Index (if provided by the fluoroscopic device): 15.3 mGy Number of Acquired Spot Images: 0 COMPARISON:  CT 07/06/2019 FINDINGS: The bladder was filled with contrast through the indwelling Foley catheter. No evidence of contrast leak/extravasation. IMPRESSION: No visible contrast extravasation from the bladder. Electronically Signed   By: Charlett Nose M.D.   On: 07/12/2019 10:20   DG Abd 2 Views  Result Date: 07/06/2019 CLINICAL DATA:  Migrated esophageal stent EXAM: ABDOMEN - 2 VIEW COMPARISON:  None. FINDINGS: Esophageal stent is in the pelvis, either in a loop of distal  small bowel or in the sigmoid colon. There is no free air. There are loops of borderline dilated small bowel with multiple air-fluid levels. There are surgical clips in the left upper abdomen. There is an apparent gastrostomy catheter in the stomach region. IMPRESSION: Esophageal stent has migrated into the pelvis, either within a loop of distal small bowel or sigmoid colon. Borderline dilatation of several loops of small bowel with multiple air-fluid levels. Question a degree of enteritis versus early bowel obstruction. A degree of ileus could present similarly. No evident free air. Gastrostomy catheter in stomach region. With respect to location of the stent, correlation with CT may be helpful to assess for small bowel versus large bowel location. These results were called by telephone at the time of interpretation on 07/06/2019 at 4:30 pm to provider [facility] , who verbally acknowledged these results. Electronically Signed   By: Bretta Bang III M.D.   On: 07/06/2019 16:30   DG Abd Portable 1V  Result Date: 07/08/2019 CLINICAL DATA:  Migrated esophageal stent EXAM: PORTABLE ABDOMEN - 1 VIEW COMPARISON:  Radiograph 07/06/2019, CT 07/06/2019 FINDINGS: A an esophageal stent projects over the right hemipelvis in a similar location to that which was seen on the comparison CT and radiograph 1 day prior. Multiple air-filled loops of bowel in the abdomen are similar to the comparison exam. These loops are at  the upper limits of normal for size but without high-grade obstructive pattern at this time. Surgical clips present in the right upper quadrant. Percutaneous gastrostomy tube projects over the upper abdomen. IMPRESSION: Stable position of the migrated esophageal stent projecting over the pelvis. Scattered air-filled loops of bowel within the abdomen but without convincing features of high-grade obstruction. Electronically Signed   By: Lovena Le M.D.   On: 07/08/2019 06:52       Subjective: - no chest pain, shortness of breath, no abdominal pain, nausea or vomiting.   Discharge Exam: BP (!) 160/96 (BP Location: Right Arm)   Pulse (!) 54   Temp 98 F (36.7 C) (Oral)   Resp 16   Ht 5\' 6"  (1.676 m)   Wt 69.3 kg   SpO2 99%   BMI 24.66 kg/m   General: Pt is alert, awake, not in acute distress Cardiovascular: RRR, S1/S2 +, no rubs, no gallops Respiratory: CTA bilaterally, no wheezing, no rhonchi Abdominal: Soft, NT, ND, bowel sounds +, PEG in place Extremities: no edema, no cyanosis    The results of significant diagnostics from this hospitalization (including imaging, microbiology, ancillary and laboratory) are listed below for reference.     Microbiology: Recent Results (from the past 240 hour(s))  Culture, blood (routine x 2)     Status: None   Collection Time: 07/06/19  7:40 PM   Specimen: BLOOD  Result Value Ref Range Status   Specimen Description   Final    BLOOD RIGHT ANTECUBITAL Performed at [facility] [address] [place], [place] [address]    Special Requests   Final    BOTTLES DRAWN AEROBIC AND ANAEROBIC Blood Culture adequate volume Performed at [facility] [address] [place], [place] [address]    Culture   Final    NO GROWTH 5 DAYS Performed at [facility], [place] [address], [place], [place] [address]    Report Status 07/11/2019 FINAL  Final  Culture, blood (routine x 2)     Status: None   Collection Time: 07/06/19  7:41 PM   Specimen: BLOOD  Result Value Ref Range Status   Specimen Description   Final    BLOOD LEFT ANTECUBITAL Performed at [facility] [address] [place], [place] [address]    Special Requests   Final    BOTTLES DRAWN AEROBIC AND ANAEROBIC Blood Culture adequate volume Performed at [facility] [address] [place], [place] [address]    Culture   Final    NO GROWTH 5 DAYS Performed at [facility], [place] [address] [place], [place] [address]    Report Status 07/11/2019 FINAL  Final  Respiratory Panel by RT PCR (Flu A&B, Covid) - Nasopharyngeal Swab     Status: None   Collection Time: 07/06/19  8:39 PM   Specimen: Nasopharyngeal Swab  Result Value Ref Range Status   SARS Coronavirus 2 by RT PCR NEGATIVE NEGATIVE Final    Comment: (NOTE) SARS-CoV-2 target nucleic acids are NOT DETECTED. The SARS-CoV-2 RNA is generally detectable in upper respiratoy specimens during the acute phase of infection. The lowest concentration of SARS-CoV-2 viral copies this assay Clarke detect is 131 copies/mL. A negative result does not preclude SARS-Cov-2 infection and should not be used as the sole basis for treatment or other patient management decisions. A negative result may occur with  improper specimen collection/handling, submission of specimen other than nasopharyngeal swab, presence of  viral mutation(s) within the areas targeted by this assay, and inadequate number of viral copies (<131 copies/mL). A negative result must be combined with clinical observations, patient history, and epidemiological information. The expected result is Negative. Fact Sheet for Patients:  https://www.moore.com/ Fact Sheet for Healthcare Providers:  https://www.young.biz/ This test is not yet ap proved or cleared by the [place] FDA and  has been authorized for detection and/or diagnosis of SARS-CoV-2 by FDA under an Emergency Use Authorization (EUA). This EUA will remain  in effect (meaning this test Clarke be used) for the duration of the COVID-19 declaration under Section 564(b)(1) of the Act, 21 U.S.C. section 360bbb-3(b)(1), unless the authorization is terminated or revoked sooner.    Influenza A by PCR NEGATIVE NEGATIVE Final   Influenza B by PCR NEGATIVE NEGATIVE Final    Comment: (NOTE) The Xpert Xpress SARS-CoV-2/FLU/RSV assay is intended as an aid in  the  diagnosis of influenza from Nasopharyngeal swab specimens and  should not be used as a sole basis for treatment. Nasal washings and  aspirates are unacceptable for Xpert Xpress SARS-CoV-2/FLU/RSV  testing. Fact Sheet for Patients: https://www.moore.com/ Fact Sheet for Healthcare Providers: https://www.young.biz/ This test is not yet approved or cleared by the [place] FDA and  has been authorized for detection and/or diagnosis of SARS-CoV-2 by  FDA under an Emergency Use Authorization (EUA). This EUA will remain  in effect (meaning this test Clarke be used) for the duration of the  Covid-19 declaration under Section 564(b)(1) of the Act, 21  U.S.C. section 360bbb-3(b)(1), unless the authorization is  terminated or revoked. Performed at [facility], [address], [place], [place] [address]   Culture, blood (Routine X 2) w Reflex to ID Panel     Status: None (Preliminary result)   Collection Time: 07/09/19  9:25 AM   Specimen: BLOOD RIGHT HAND  Result Value Ref Range Status   Specimen Description   Final    BLOOD RIGHT HAND Performed at [facility], [address], [place], [place] [address]    Special Requests   Final    BOTTLES DRAWN AEROBIC AND ANAEROBIC Blood Culture adequate volume Performed at [facility], [address], [place], [place] [address]    Culture   Final    NO GROWTH 3 DAYS Performed at [facility], [address], [place], [place] [address]    Report Status PENDING  Incomplete  Culture, blood (Routine X 2) w Reflex to ID Panel     Status: None (Preliminary result)   Collection Time: 07/09/19  9:31 AM   Specimen: BLOOD LEFT HAND  Result Value Ref Range Status   Specimen Description   Final    BLOOD LEFT HAND Performed at [facility], [address], [place], [place] [address]    Special Requests   Final    BOTTLES DRAWN AEROBIC  AND ANAEROBIC Blood Culture adequate volume Performed at [facility], [address], [place], [place] [address]    Culture   Final    NO GROWTH 3 DAYS Performed at [facility], [address] [place], [place] [address]    Report Status PENDING  Incomplete     Labs: Basic Metabolic Panel: Recent Labs  Lab 07/09/19 0400 07/09/19 0400 07/09/19 1052 07/10/19 0741 07/11/19 0427 07/11/19 1504 07/12/19 0338 07/13/19 0424  NA 142   < > 141 140 139  --  137 140  K  4.3   < > 3.8 3.8 3.5  --  3.4* 3.6  CL 112*   < > 112* 111 111  --  110 112*  CO2 22   < > 21* 21* 22  --  20* 21*  GLUCOSE 142*   < > 108* 94 94  --  95 90  BUN 12   < > 12 8 6   --  5* 6  CREATININE 0.86   < > 0.80 0.70 0.76  --  0.82 0.82  CALCIUM 8.6*   < > 8.3* 8.4* 8.3*  --  8.2* 8.7*  MG 1.9  --   --   --   --  1.8  --   --    < > = values in this interval not displayed.   Liver Function Tests: Recent Labs  Lab 07/06/19 1346 07/06/19 1800 07/07/19 0313 07/09/19 1052  AST 10 13* 11* 12*  ALT 8 11 12 11   ALKPHOS 115 107 101 76  BILITOT 0.4 0.5 0.7 0.5  PROT 7.1 6.9 6.6 6.0*  ALBUMIN 3.7 3.5 3.2* 2.8*   CBC: Recent Labs  Lab 07/06/19 1346 07/06/19 1346 07/06/19 1800 07/06/19 2347 07/09/19 0400 07/10/19 0516 07/11/19 0427 07/12/19 0338 07/13/19 0424  WBC 18.4 Repeated and verified X2.*   < > 16.0*   < > 16.5* 7.5 8.6 10.1 9.2  NEUTROABS 12.4*  --  10.8*  --   --   --   --   --   --   HGB 13.4   < > 12.8*   < > 11.9* 10.9* 11.0* 10.8* 11.0*  HCT 39.7   < > 38.2*   < > 35.4* 34.1* 32.8* 33.2* 32.9*  MCV 88.0   < > 90.3   < > 90.5 92.7 90.4 91.7 89.6  PLT 452.0*   < > 411*   < > 356 357 338 349 348   < > = values in this interval not displayed.   CBG: No results for input(s): GLUCAP in the last 168 hours. Hgb A1c No results for input(s): HGBA1C in the last 72 hours. Lipid Profile No results for input(s): CHOL, HDL, LDLCALC, TRIG, CHOLHDL, LDLDIRECT in the last 72  hours. Thyroid function studies No results for input(s): TSH, T4TOTAL, T3FREE, THYROIDAB in the last 72 hours.  Invalid input(s): FREET3 Urinalysis    Component Value Date/Time   COLORURINE AMBER (A) 07/06/2019 2059   APPEARANCEUR CLEAR 07/06/2019 2059   LABSPEC 1.045 (H) 07/06/2019 2059   PHURINE 7.0 07/06/2019 2059   GLUCOSEU NEGATIVE 07/06/2019 2059   HGBUR NEGATIVE 07/06/2019 2059   BILIRUBINUR NEGATIVE 07/06/2019 2059   KETONESUR NEGATIVE 07/06/2019 2059   PROTEINUR NEGATIVE 07/06/2019 2059   UROBILINOGEN 1.0 12/19/2014 0646   NITRITE NEGATIVE 07/06/2019 2059   LEUKOCYTESUR NEGATIVE 07/06/2019 2059    FURTHER DISCHARGE INSTRUCTIONS:   Get Medicines reviewed and adjusted: Please take all your medications with you for your next visit with your Primary MD   Laboratory/radiological data: Please request your Primary MD to go over all hospital tests and procedure/radiological results at the follow up, please ask your Primary MD to get all Hospital records sent to his/her office.   In some cases, they will be blood work, cultures and biopsy results pending at the time of your discharge. Please request that your primary care M.D. goes through all the records of your hospital data and follows up on these results.   Also Note the following: If you experience worsening  of your admission symptoms, develop shortness of breath, life threatening emergency, suicidal or homicidal thoughts you must seek medical attention immediately by calling 911 or calling your MD immediately  if symptoms less severe.   You must read complete instructions/literature along with all the possible adverse reactions/side effects for all the Medicines you take and that have been prescribed to you. Take any new Medicines after you have completely understood and accpet all the possible adverse reactions/side effects.    Do not drive when taking Pain medications or sleeping medications (Benzodaizepines)   Do not  take more than prescribed Pain, Sleep and Anxiety Medications. It is not advisable to combine anxiety,sleep and pain medications without talking with your primary care practitioner   Special Instructions: If you have smoked or chewed Tobacco  in the last 2 yrs please stop smoking, stop any regular Alcohol  and or any Recreational drug use.   Wear Seat belts while driving.   Please note: You were cared for by a hospitalist during your hospital stay. Once you are discharged, your primary care physician will handle any further medical issues. Please note that NO REFILLS for any discharge medications will be authorized once you are discharged, as it is imperative that you return to your primary care physician (or establish a relationship with a primary care physician if you do not have one) for your post hospital discharge needs so that they Clarke reassess your need for medications and monitor your lab values.  Time coordinating discharge: 40 minutes  SIGNED:  Pamella Pert, MD, PhD 07/13/2019, 9:45 AM

## 2019-07-13 NOTE — Progress Notes (Signed)
Key Points: Use following P&T approved IV to PO non-antibiotic change policy.  Description contains the criteria that are approved Note: Policy Excludes:  Esophagectomy patientsPHARMACIST - PHYSICIAN COMMUNICATION DR:   Elvera Lennox CONCERNING: IV to Oral Route Change Policy  RECOMMENDATION: This patient is receiving pepcid, protonix by the intravenous route.  Based on criteria approved by the Pharmacy and Therapeutics Committee, the intravenous medication(s) is/are being converted to the equivalent oral dose form(s).   DESCRIPTION: These criteria include:  The patient is eating (either orally or via tube) and/or has been taking other orally administered medications for a least 24 hours  The patient has no evidence of active gastrointestinal bleeding or impaired GI absorption (gastrectomy, short bowel, patient on TNA or NPO).  If you have questions about this conversion, please contact the Pharmacy Department  []   [phone] )  ( [phone] []   [phone] )   []   [phone] )  [facility] [x]   [phone] )  [facility]  [facility] [place] 07/13/2019 8:58 AM

## 2019-07-13 NOTE — Progress Notes (Signed)
Nutrition Follow-up  DOCUMENTATION CODES:   Not applicable  INTERVENTION:  Recommend  -5 cans (1185 ml/day) Jevity 1.5 daily -30 ml Pro-stat daily via tube 120 ml free water flushes TID  Provides 1875 kcal, 91 grams of protein, and 900 ml water (1260 ml total with FWF)  Provided Full liquid diet nutrition education   NUTRITION DIAGNOSIS:   Inadequate oral intake related to acute illness, nausea, vomiting as evidenced by per patient/family report, NPO status.  Progressing  GOAL:   Patient will meet greater than or equal to 90% of their needs  Progressing  MONITOR:   Diet advancement  REASON FOR ASSESSMENT:   Malnutrition Screening Tool    ASSESSMENT:   54 y.o. male with medical history significant of HTN, polysubstance abuse, and stenosis of esophagus s/p stent placement. He presented to ED for evaluation of abdominal pain x3 days--around his bellybutton, colicky in nature, 62/95 on pain scale, radiates to his back, and he has associated N/V which has caused poor appetite. He has PEG but he is unsure if it is used for feeding. Outpatient GI MD encouraged patient to go to the ED after it was determined that esophageal stent had likely migrated.  Patient is s/p lap assisted removal of FB with SBR and repair of bladder wall on 2/24.  Met with patient at bedside this morning. He endorses good appetite and intake on full liquid diet, recalls 100% of grits this morning for breakfast. Patient recalls usual intake of 2 cans of Jevity 1.2 at home 2-3 times/day, 120 ml water flushes twice daily and endorses eating a regular diet, recalling hamburgers and sandwiches in addition to tube feedings until onset of symptoms.   RD educated on full liquid diet and provided handout for reference at home. Recommended daily oral intake of nutrition supplements in addition to tube feedings to aid with meeting estimated needs.  Current wt 152.5 lbs UBW 185 lbs ~6 months ago per pt Per chart  review, weight on 05/19/19 was 162 lb and weight had been stable for 2 months (November-January). This indicates 9 lb weight loss (5.5% body weight) in the past 6 weeks; not significant for time frame.   Medications reviewed and include: Colace, Pepcid, Protonix  Labs: BG 90-108   NUTRITION - FOCUSED PHYSICAL EXAM:    Most Recent Value  Orbital Region  Moderate depletion  Upper Arm Region  Mild depletion  Thoracic and Lumbar Region  Mild depletion  Buccal Region  Moderate depletion  Temple Region  Mild depletion  Clavicle Bone Region  Moderate depletion  Clavicle and Acromion Bone Region  No depletion  Scapular Bone Region  Unable to assess  Dorsal Hand  Moderate depletion  Patellar Region  Moderate depletion  Anterior Thigh Region  Mild depletion  Posterior Calf Region  Moderate depletion  Edema (RD Assessment)  None  Hair  Reviewed  Eyes  Reviewed  Mouth  Reviewed [edentulous]  Skin  Reviewed  Nails  Reviewed       Diet Order:   Diet Order            Diet full liquid Room service appropriate? Yes; Fluid consistency: Thin  Diet effective now              EDUCATION NEEDS:   No education needs have been identified at this time  Skin:  Skin Assessment: Reviewed RN Assessment  Last BM:  3/3  Height:   Ht Readings from Last 1 Encounters:  07/07/19 5' 6"  (1.676  m)    Weight:   Wt Readings from Last 1 Encounters:  07/07/19 69.3 kg    Ideal Body Weight:  59.1 kg  BMI:  Body mass index is 24.66 kg/m.  Estimated Nutritional Needs:   Kcal:  2100-2300 kcal  Protein:  100-115  grams  Fluid:  >/= 2.2 L/day    Lajuan Lines, RD, LDN Clinical Nutrition After Hours/Weekend Pager # in [place]
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
## 2019-07-13 NOTE — Discharge Instructions (Signed)
Full Liquid Diet A full liquid diet refers to fluids and foods that are liquid or will become liquid at room temperature. This diet should only be used for a short period of time to help you recover from illness or surgery. Your health care provider or dietitian will help you determine when it is safe to eat regular foods. What are tips for following this plan?     Reading food labels  Check food labels of nutrition shakes for the amount of protein. Look for nutrition shakes that have at least 8-10 grams of protein in each serving.  Look for drinks, such as milks and juices, that are "fortified" or "enriched." This means that vitamins and minerals have been added. Shopping  Buy premade nutritional shakes to keep on hand.  To vary your choices, buy different flavors of milks and shakes. Meal planning  Choose flavors and foods that you enjoy.  To make sure you get enough energy from food (calories): ? Eat 3 full liquid meals each day. Have a liquid snack between each meal. ? Drink 6-8 ounces (177-237 ml) of a nutrition supplement shake with meals or as snacks. ? Add protein powder, powdered milk, milk, or yogurt to shakes to increase the amount of protein.  Drink at least one serving a day of citrus fruit juice or fruit juice that has vitamin C added. General guidelines  Before starting the full-liquid diet, check with your health care provider to know what foods you should avoid. These may include full-fat or high-fiber liquids.  You may have any liquid or food that becomes a liquid at room temperature. The food is considered a liquid if it can be poured off a spoon at room temperature.  Do not drink alcohol unless approved by your health care provider.  This diet gives you most of the nutrients that you need for energy, but you may not get enough of certain vitamins, minerals, and fiber. Make sure to talk to your health care provider or dietitian about: ? How many calories you need  to eat get day. ? How much fluid you should have each day. ? Taking a multivitamin or a nutritional supplement. What foods are allowed? The items listed may not be a complete list. Talk with your dietitian about what dietary choices are best for you. Grains Thin hot cereal, such as cream of wheat. Soft-cooked pasta or rice pured in soup. Vegetables Pulp-free tomato or vegetable juice. Vegetables pured in soup. Fruits Fruit juice without pulp. Strained fruit pures (seeds and skins removed). Meats and other protein foods Beef, chicken, and fish broths. Powdered protein supplements. Dairy Milk and milk-based beverages, including milk shakes and instant breakfast mixes. Smooth yogurt. Pured cottage cheese. Beverages Water. Coffee and tea (caffeinated or decaffeinated). Cocoa. Liquid nutritional supplements. Soft drinks. Nondairy milks, such as almond, coconut, rice, or soy milk. Fats and oils Melted margarine and butter. Cream. Canola, almond, avocado, corn, grapeseed, sunflower, and sesame oils. Gravy. Sweets and desserts Custard. Pudding. Flavored gelatin. Smooth ice cream (without nuts or candy pieces). Sherbet. Popsicles. Italian ice. Pudding pops. Seasoning and other foods Salt and pepper. Spices. Cocoa powder. Vinegar. Ketchup. Yellow mustard. Smooth sauces, such as Hollandaise, cheese sauce, or white sauce. Soy sauce. Cream soups. Strained soups. Syrup. Honey. Jelly (without fruit pieces). What foods are not allowed? The items listed may not be a complete list. Talk with your dietitian about what dietary choices are best for you. Grains Whole grains. Pasta. Rice. Cold cereal. Bread. Crackers.   Vegetables All whole fresh, frozen, or canned vegetables. Fruits All whole fresh, frozen, or canned fruits. Meats and other protein foods All cuts of meat, poultry, and fish. Eggs. Tofu and soy protein. Nuts and nut butters. Lunch meat. Sausage. Dairy Hard cheese. Yogurt with fruit  chunks. Fats and oils Coconut oil. Palm oil. Lard. Cold butter. Sweets and desserts Ice cream or other frozen desserts that have any solids in them or on top, such as nuts, chocolate chips, and pieces of cookies. Cakes. Cookies. Candy. Seasoning and other foods Stone-ground mustards. Soups with chunks or pieces. Summary  A full liquid diet refers to fluids and foods that are liquid or will become liquid at room temperature.  This diet should only be used for a short period of time to help you recover from illness or surgery. Ask your health care provider or dietitian when it is safe for you to eat regular foods.  To make sure you get enough calories and nutrients, eat 3 meals each day with snacks between. Drink premade nutrition supplement shakes or add protein powder to homemade shakes. Take a vitamin and mineral supplement as told by your health care provider. This information is not intended to replace advice given to you by your health care provider. Make sure you discuss any questions you have with your health care provider. Document Revised: 07/25/2017 Document Reviewed: 06/11/2016 Elsevier Patient Education  2020 [facility], [place] [phone]  OPEN ABDOMINAL SURGERY: POST OP INSTRUCTIONS  Always review your discharge instruction sheet given to you by the facility where your surgery was performed.  IF YOU HAVE DISABILITY OR FAMILY LEAVE FORMS, YOU MUST BRING THEM TO THE OFFICE FOR PROCESSING.  PLEASE DO NOT GIVE THEM TO YOUR DOCTOR.  1. A prescription for pain medication may be given to you upon discharge.  Take your pain medication as prescribed, if needed.  If narcotic pain medicine is not needed, then you may take acetaminophen (Tylenol) or ibuprofen (Advil) as needed. 2. Take your usually prescribed medications unless otherwise directed. 3. If you need a refill on your pain medication, please contact your pharmacy. They will contact our  office to request authorization.  Prescriptions will not be filled after 5pm or on week-ends. 4. You should follow a light diet the first few days after arrival home, such as soup and crackers, pudding, etc.unless your doctor has advised otherwise. A high-fiber, low fat diet can be resumed as tolerated.   Be sure to include lots of fluids daily. Most patients will experience some swelling and bruising on the chest and neck area.  Ice packs will help.  Swelling and bruising can take several days to resolve 5. Most patients will experience some swelling and bruising in the area of the incision. Ice pack will help. Swelling and bruising can take several days to resolve..  6. It is common to experience some constipation if taking pain medication after surgery.  Increasing fluid intake and taking a stool softener will usually help or prevent this problem from occurring.  A mild laxative (Milk of Magnesia or Miralax) should be taken according to package directions if there are no bowel movements after 48 hours. 7.  You may have steri-strips (small skin tapes) in place directly over the incision.  These strips should be left on the skin for 7-10 days.  If your surgeon used skin glue on the incision, you may shower in 24 hours.  The glue will  flake off over the next 2-3 weeks.  Any sutures or staples will be removed at the office during your follow-up visit. You may find that a light gauze bandage over your incision may keep your staples from being rubbed or pulled. You may shower and replace the bandage daily. 8. ACTIVITIES:  You may resume regular (light) daily activities beginning the next day--such as daily self-care, walking, climbing stairs--gradually increasing activities as tolerated.  You may have sexual intercourse when it is comfortable.  Refrain from any heavy lifting or straining until approved by your doctor. a. You may drive when you no longer are taking prescription pain medication, you can comfortably  wear a seatbelt, and you can safely maneuver your car and apply brakes b. Return to Work: ___________________________________ 9. You should see your doctor in the office for a follow-up appointment approximately two weeks after your surgery.  Make sure that you call for this appointment within a day or two after you arrive home to insure a convenient appointment time. OTHER INSTRUCTIONS:  _____________________________________________________________ _____________________________________________________________  WHEN TO CALL YOUR DOCTOR: 1. Fever over 101.0 2. Inability to urinate 3. Nausea and/or vomiting 4. Extreme swelling or bruising 5. Continued bleeding from incision. 6. Increased pain, redness, or drainage from the incision. 7. Difficulty swallowing or breathing 8. Muscle cramping or spasms. 9. Numbness or tingling in hands or feet or around lips.  The clinic staff is available to answer your questions during regular business hours.  Please don't hesitate to call and ask to speak to one of the nurses if you have concerns.  For further questions, please visit [email or web address]  ........Marland Kitchen   Managing Your Pain After Surgery Without Opioids    Thank you for participating in our program to help patients manage their pain after surgery without opioids. This is part of our effort to provide you with the best care possible, without exposing you or your family to the risk that opioids pose.  What pain can I expect after surgery? You can expect to have some pain after surgery. This is normal. The pain is typically worse the day after surgery, and quickly begins to get better. Many studies have found that many patients are able to manage their pain after surgery with Over-the-Counter (OTC) medications such as Tylenol and Motrin. If you have a condition that does not allow you to take Tylenol or Motrin, notify your surgical team.  How will I manage my pain? The best  strategy for controlling your pain after surgery is around the clock pain control with Tylenol (acetaminophen) and Motrin (ibuprofen or Advil). Alternating these medications with each other allows you to maximize your pain control. In addition to Tylenol and Motrin, you can use heating pads or ice packs on your incisions to help reduce your pain.  How will I alternate your regular strength over-the-counter pain medication? You will take a dose of pain medication every three hours. ; Start by taking 650 mg of Tylenol (2 pills of 325 mg) ; 3 hours later take 600 mg of Motrin (3 pills of 200 mg) ; 3 hours after taking the Motrin take 650 mg of Tylenol ; 3 hours after that take 600 mg of Motrin.   - 1 -  See example - if your first dose of Tylenol is at 12:00 PM   12:00 PM Tylenol 650 mg (2 pills of 325 mg)  3:00 PM Motrin 600 mg (3 pills of 200 mg)  6:00 PM Tylenol 650 mg (2 pills  of 325 mg)  9:00 PM Motrin 600 mg (3 pills of 200 mg)  Continue alternating every 3 hours   We recommend that you follow this schedule around-the-clock for at least 3 days after surgery, or until you feel that it is no longer needed. Use the table on the last page of this handout to keep track of the medications you are taking. Important: Do not take more than 3000mg  of Tylenol or 3200mg  of Motrin in a 24-hour period. Do not take ibuprofen/Motrin if you have a history of bleeding stomach ulcers, severe kidney disease, &/or actively taking a blood thinner  What if I still have pain? If you have pain that is not controlled with the over-the-counter pain medications (Tylenol and Motrin or Advil) you might have what we call "breakthrough" pain. You will receive a prescription for a small amount of an opioid pain medication such as Oxycodone, Tramadol, or Tylenol with Codeine. Use these opioid pills in the first 24 hours after surgery if you have breakthrough pain. Do not take more than 1 pill every 4-6 hours.  If you  still have uncontrolled pain after using all opioid pills, don't hesitate to call our staff using the number provided. We will help make sure you are managing your pain in the best way possible, and if necessary, we can provide a prescription for additional pain medication.   Day 1    Time  Name of Medication Number of pills taken  Amount of Acetaminophen  Pain Level   Comments  AM PM       AM PM       AM PM       AM PM       AM PM       AM PM       AM PM       AM PM       Total Daily amount of Acetaminophen Do not take more than  3,000 mg per day      Day 2    Time  Name of Medication Number of pills taken  Amount of Acetaminophen  Pain Level   Comments  AM PM       AM PM       AM PM       AM PM       AM PM       AM PM       AM PM       AM PM       Total Daily amount of Acetaminophen Do not take more than  3,000 mg per day      Day 3    Time  Name of Medication Number of pills taken  Amount of Acetaminophen  Pain Level   Comments  AM PM       AM PM       AM PM       AM PM          AM PM       AM PM       AM PM       AM PM       Total Daily amount of Acetaminophen Do not take more than  3,000 mg per day      Day 4    Time  Name of Medication Number of pills taken  Amount of Acetaminophen  Pain Level   Comments  AM PM  AM PM       AM PM       AM PM       AM PM       AM PM       AM PM       AM PM       Total Daily amount of Acetaminophen Do not take more than  3,000 mg per day      Day 5    Time  Name of Medication Number of pills taken  Amount of Acetaminophen  Pain Level   Comments  AM PM       AM PM       AM PM       AM PM       AM PM       AM PM       AM PM       AM PM       Total Daily amount of Acetaminophen Do not take more than  3,000 mg per day       Day 6    Time  Name of Medication Number of pills taken  Amount of Acetaminophen  Pain Level  Comments  AM PM       AM PM       AM PM         AM PM       AM PM       AM PM       AM PM       AM PM       Total Daily amount of Acetaminophen Do not take more than  3,000 mg per day      Day 7    Time  Name of Medication Number of pills taken  Amount of Acetaminophen  Pain Level   Comments  AM PM       AM PM       AM PM       AM PM       AM PM       AM PM       AM PM       AM PM       Total Daily amount of Acetaminophen Do not take more than  3,000 mg per day        For additional information about how and where to safely dispose of unused opioid medications - [email or web address]  Disclaimer: This document contains information and/or instructional materials adapted from [facility] for the typical patient with your condition. It does not replace medical advice from your health care provider because your experience may differ from that of the typical patient. Talk to your health care provider if you have any questions about this document, your condition or your treatment plan. Adapted from [facility]

## 2019-07-13 NOTE — Plan of Care (Signed)
  Problem: Health Behavior/Discharge Planning: Goal: Ability to manage health-related needs will improve 07/13/2019 1042 by Densil Ottey, Petra Kuba, RN Outcome: Adequate for Discharge 07/13/2019 1042 by Bonni Neuser, Petra Kuba, RN Outcome: Progressing   Problem: Clinical Measurements: Goal: Ability to maintain clinical measurements within normal limits will improve 07/13/2019 1042 by Memorie Yokoyama, Petra Kuba, RN Outcome: Adequate for Discharge 07/13/2019 1042 by Harpreet Signore, Petra Kuba, RN Outcome: Progressing Goal: Will remain free from infection 07/13/2019 1042 by Romell Cavanah, Petra Kuba, RN Outcome: Adequate for Discharge 07/13/2019 1042 by Dannel Rafter, Petra Kuba, RN Outcome: Progressing   Problem: Activity: Goal: Risk for activity intolerance will decrease 07/13/2019 1042 by Addysen Louth, Petra Kuba, RN Outcome: Adequate for Discharge 07/13/2019 1042 by Ellean Firman, Petra Kuba, RN Outcome: Progressing   Problem: Nutrition: Goal: Adequate nutrition will be maintained 07/13/2019 1042 by Nannette Zill, Petra Kuba, RN Outcome: Adequate for Discharge 07/13/2019 1042 by Kynnedi Zweig, Petra Kuba, RN Outcome: Progressing   Problem: Nutrition: Goal: Adequate nutrition will be maintained 07/13/2019 1042 by Minette Brine, RN Outcome: Adequate for Discharge 07/13/2019 1042 by Hiran Leard, Petra Kuba, RN Outcome: Progressing   Problem: Elimination: Goal: Will not experience complications related to bowel motility 07/13/2019 1042 by Minette Brine, RN Outcome: Adequate for Discharge 07/13/2019 1042 by Sofi Bryars, Petra Kuba, RN Outcome: Progressing Goal: Will not experience complications related to urinary retention 07/13/2019 1042 by Kei Langhorst, Petra Kuba, RN Outcome: Adequate for Discharge 07/13/2019 1042 by Mackensi Mahadeo, Petra Kuba, RN Outcome: Progressing   Problem: Pain Managment: Goal: General experience of comfort will improve 07/13/2019 1042 by Minette Brine, RN Outcome: Adequate for Discharge 07/13/2019 1042 by Santiago Stenzel, Petra Kuba, RN Outcome: Progressing

## 2019-07-14 ENCOUNTER — Other Ambulatory Visit (HOSPITAL_COMMUNITY): Payer: Medicaid Other

## 2019-07-14 LAB — CULTURE, BLOOD (ROUTINE X 2)
Culture: NO GROWTH
Culture: NO GROWTH
Special Requests: ADEQUATE
Special Requests: ADEQUATE

## 2019-07-18 ENCOUNTER — Encounter (HOSPITAL_COMMUNITY): Admission: RE | Payer: Self-pay | Source: Home / Self Care

## 2019-07-18 ENCOUNTER — Ambulatory Visit (HOSPITAL_COMMUNITY): Admission: RE | Admit: 2019-07-18 | Payer: Medicaid Other | Source: Home / Self Care | Admitting: Gastroenterology

## 2019-07-18 SURGERY — ESOPHAGOGASTRODUODENOSCOPY (EGD) WITH PROPOFOL
Anesthesia: Monitor Anesthesia Care

## 2019-07-25 ENCOUNTER — Other Ambulatory Visit: Payer: Self-pay | Admitting: Internal Medicine

## 2019-07-25 DIAGNOSIS — I10 Essential (primary) hypertension: Secondary | ICD-10-CM

## 2019-07-25 MED FILL — LISINOPRIL 20 MG TABLET: 20 | 30 days supply | Qty: 30 | Fill #0

## 2019-07-25 MED FILL — PANTOPRAZOLE SOD DR 40 MG T: 40 | 30 days supply | Qty: 120 | Fill #1

## 2019-07-27 ENCOUNTER — Other Ambulatory Visit: Payer: Self-pay | Admitting: Internal Medicine

## 2019-07-27 ENCOUNTER — Other Ambulatory Visit: Payer: Self-pay

## 2019-07-27 ENCOUNTER — Encounter: Payer: Self-pay | Admitting: Internal Medicine

## 2019-07-27 ENCOUNTER — Ambulatory Visit: Payer: Medicaid Other | Admitting: Internal Medicine

## 2019-07-27 DIAGNOSIS — Z9889 Other specified postprocedural states: Secondary | ICD-10-CM

## 2019-07-27 DIAGNOSIS — F1120 Opioid dependence, uncomplicated: Secondary | ICD-10-CM

## 2019-07-27 DIAGNOSIS — F419 Anxiety disorder, unspecified: Secondary | ICD-10-CM

## 2019-07-27 DIAGNOSIS — F1121 Opioid dependence, in remission: Secondary | ICD-10-CM

## 2019-07-27 DIAGNOSIS — F17210 Nicotine dependence, cigarettes, uncomplicated: Secondary | ICD-10-CM

## 2019-07-27 DIAGNOSIS — K222 Esophageal obstruction: Secondary | ICD-10-CM

## 2019-07-27 DIAGNOSIS — F5105 Insomnia due to other mental disorder: Secondary | ICD-10-CM | POA: Diagnosis not present

## 2019-07-27 DIAGNOSIS — Z79899 Other long term (current) drug therapy: Secondary | ICD-10-CM

## 2019-07-27 DIAGNOSIS — Z931 Gastrostomy status: Secondary | ICD-10-CM

## 2019-07-27 DIAGNOSIS — I10 Essential (primary) hypertension: Secondary | ICD-10-CM | POA: Diagnosis not present

## 2019-07-27 MED ORDER — TRAZODONE HCL 100 MG PO TABS: 50.0000 mg | ORAL_TABLET | Freq: Every day | ORAL | 1 refills | Status: DC

## 2019-07-27 MED ORDER — LISINOPRIL 20 MG PO TABS: 20.0000 mg | ORAL_TABLET | Freq: Every day | ORAL | 1 refills | Status: DC

## 2019-07-27 MED ORDER — BUPRENORPHINE HCL-NALOXONE HCL 8-2 MG SL FILM: 1.0000 | ORAL_FILM | Freq: Two times a day (BID) | SUBLINGUAL | 0 refills | Status: DC

## 2019-07-27 MED FILL — traZODone HCL 100 MG TABS: 100 | 30 days supply | Qty: 30 | Fill #0

## 2019-07-27 MED FILL — SUBOXONE 8 MG-2 MG SL FILM: 8-2 | 30 days supply | Qty: 60 | Fill #0

## 2019-07-27 NOTE — Assessment & Plan Note (Signed)
Patient was recently hospitalized after admission for SBO in the setting of a dislodged esophageal stent which became stuck in his distal jejunum.  General surgery and GI were consulted.  He underwent laparoscopic-assisted removal of the foreign body.  He did well postoperatively and was discharged on 07/13/2019.  Since then, he reports normal bowel movements and denies nausea and vomiting.  He has been tolerating liquid and soft diet by mouth without issue.  He also supplements with tube feeds through his G-tube.  On exam today his incision is well-healed, abdomen is soft.  -- Follow-up with GI.

## 2019-07-27 NOTE — Assessment & Plan Note (Signed)
Blood pressure slightly elevated today, 137/72.  He ran out of his lisinopril 2 days ago.  Refill sent to his pharmacy.  Continue 20 mg daily.

## 2019-07-27 NOTE — Patient Instructions (Signed)
Vincent Clarke,  It was a pleasure to see you today. I am glad to hear you are doing well since getting out of the hospital. I have sent refills of your medicine to the [facility] outpatient pharmacy. Follow up with me or in the subxone clinic in 1 month for a telehealth visit.   If you have any questions or concerns, call our clinic at [phone] or after hours call [phone] and ask for the internal medicine resident on call. Thank you!  Dr. Antony Contras

## 2019-07-27 NOTE — Assessment & Plan Note (Signed)
Patient has opioid use disorder which is being treated with Suboxone.  He admits to recent relapse prior to his hospitalization due to severe pain from his SBO.  He did receive opioid pain medications during his hospitalization and his Suboxone was temporarily stopped.  Since discharge, he has restarted Suboxone and reports he is doing well.  He did run out of his films yesterday, took his last dose yesterday evening.  Appears to be in mild opioid withdrawal this morning.  Advised patient to call for refill next time prior to running out.  We will bridge him between appointments.  Sent refill for Suboxone 8-2 mg twice daily to his pharmacy.  We will hold off on UDS today given recent relapse.  Reviewed database, refill history is appropriate. -Follow-up 4 weeks telehealth with me or in the OUD clinic
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
## 2019-07-27 NOTE — Assessment & Plan Note (Signed)
Patient takes trazodone as needed.  This continues to work well for him.  Reports he occasionally needs to take 2 tablets for efficacy.  He is currently on a low-dose, 50 mg.  We will increase this to 100 mg as needed.  Prescription sent to pharmacy.

## 2019-08-09 ENCOUNTER — Ambulatory Visit: Payer: Medicaid Other | Admitting: Gastroenterology

## 2019-08-23 ENCOUNTER — Other Ambulatory Visit: Payer: Self-pay

## 2019-08-23 ENCOUNTER — Ambulatory Visit (INDEPENDENT_AMBULATORY_CARE_PROVIDER_SITE_OTHER): Payer: Medicaid Other | Admitting: Internal Medicine

## 2019-08-23 DIAGNOSIS — F1121 Opioid dependence, in remission: Secondary | ICD-10-CM | POA: Diagnosis not present

## 2019-08-23 MED ORDER — BUPRENORPHINE HCL-NALOXONE HCL 8-2 MG SL FILM: 1.0000 | ORAL_FILM | Freq: Two times a day (BID) | SUBLINGUAL | 0 refills | Status: DC
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
## 2019-08-23 NOTE — Assessment & Plan Note (Signed)
He reports he is continued to recover after his SBO hospitalization and surgery.  He denies any relapses of opioids he is continued on Suboxone 1 film twice daily and reports this is controlling cravings for opioids well.  He feels like he is getting better and appreciative of his care.  Assessment OUD- moderate, early remission  Plan: Refill suboxone 8-2mg  films, 1 film twice daily, follow up in 1 month in person.

## 2019-08-25 DIAGNOSIS — R1310 Dysphagia, unspecified: Secondary | ICD-10-CM | POA: Diagnosis not present

## 2019-08-25 DIAGNOSIS — K222 Esophageal obstruction: Secondary | ICD-10-CM | POA: Diagnosis not present

## 2019-08-26 MED FILL — SUBOXONE 8 MG-2 MG SL FILM: 8-2 | 30 days supply | Qty: 60 | Fill #0

## 2019-09-02 ENCOUNTER — Ambulatory Visit: Payer: Medicaid Other | Admitting: Gastroenterology

## 2019-09-02 ENCOUNTER — Encounter: Payer: Self-pay | Admitting: Gastroenterology

## 2019-09-02 VITALS — BP 148/78 | HR 77 | Temp 97.5°F | Ht 68.0 in | Wt 153.4 lb

## 2019-09-02 DIAGNOSIS — R933 Abnormal findings on diagnostic imaging of other parts of digestive tract: Secondary | ICD-10-CM

## 2019-09-02 DIAGNOSIS — Z8719 Personal history of other diseases of the digestive system: Secondary | ICD-10-CM | POA: Diagnosis not present

## 2019-09-02 DIAGNOSIS — K222 Esophageal obstruction: Secondary | ICD-10-CM

## 2019-09-02 MED FILL — PANTOPRAZOLE SOD DR 40 MG T: 40 | 30 days supply | Qty: 120 | Fill #2

## 2019-09-02 MED FILL — LISINOPRIL 20 MG TABLET: 20 | 30 days supply | Qty: 30 | Fill #1

## 2019-09-02 NOTE — Patient Instructions (Addendum)
If you are age 55 or older, your body mass index should be between 23-30. Your Body mass index is 23.32 kg/m. If this is out of the aforementioned range listed, please consider follow up with your Primary Care Provider.  If you are age 74 or younger, your body mass index should be between 19-25. Your Body mass index is 23.32 kg/m. If this is out of the aformentioned range listed, please consider follow up with your Primary Care Provider.   You have been scheduled for an endoscopy. Please follow written instructions given to you at your visit today. If you use inhalers (even only as needed), please bring them with you on the day of your procedure.  We will send your records to [facility], they should reach out to you schedule. [phone]   It was a pleasure to see you today!  Dr.Mansouraty
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
## 2019-09-03 ENCOUNTER — Encounter: Payer: Self-pay | Admitting: Gastroenterology

## 2019-09-03 DIAGNOSIS — R933 Abnormal findings on diagnostic imaging of other parts of digestive tract: Secondary | ICD-10-CM | POA: Insufficient documentation

## 2019-09-03 DIAGNOSIS — Z8719 Personal history of other diseases of the digestive system: Secondary | ICD-10-CM | POA: Insufficient documentation

## 2019-09-10 DIAGNOSIS — R1310 Dysphagia, unspecified: Secondary | ICD-10-CM | POA: Diagnosis not present

## 2019-09-10 DIAGNOSIS — K222 Esophageal obstruction: Secondary | ICD-10-CM | POA: Diagnosis not present

## 2019-09-15 ENCOUNTER — Ambulatory Visit (INDEPENDENT_AMBULATORY_CARE_PROVIDER_SITE_OTHER): Payer: Medicaid Other

## 2019-09-15 DIAGNOSIS — Z1159 Encounter for screening for other viral diseases: Secondary | ICD-10-CM

## 2019-09-19 ENCOUNTER — Encounter: Payer: Self-pay | Admitting: Gastroenterology

## 2019-09-19 ENCOUNTER — Other Ambulatory Visit: Payer: Self-pay

## 2019-09-19 ENCOUNTER — Ambulatory Visit (AMBULATORY_SURGERY_CENTER): Payer: Medicaid Other | Admitting: Gastroenterology

## 2019-09-19 VITALS — BP 122/78 | HR 55 | Temp 97.7°F | Resp 14 | Ht 68.0 in | Wt 153.0 lb

## 2019-09-19 DIAGNOSIS — I10 Essential (primary) hypertension: Secondary | ICD-10-CM | POA: Diagnosis not present

## 2019-09-19 DIAGNOSIS — K222 Esophageal obstruction: Secondary | ICD-10-CM | POA: Diagnosis not present

## 2019-09-19 DIAGNOSIS — K219 Gastro-esophageal reflux disease without esophagitis: Secondary | ICD-10-CM | POA: Diagnosis not present

## 2019-09-19 DIAGNOSIS — K2270 Barrett's esophagus without dysplasia: Secondary | ICD-10-CM

## 2019-09-19 MED ORDER — SODIUM CHLORIDE 0.9 % IV SOLN: 500.0000 mL | Freq: Once | INTRAVENOUS | Status: DC

## 2019-09-19 NOTE — Progress Notes (Signed)
Pt's states no medical or surgical changes since previsit or office visit. 

## 2019-09-19 NOTE — Op Note (Addendum)
[facility] Patient Name: Vincent Clarke Procedure Date: 09/19/2019 7:29 AM MRN: [id number] Endoscopist: Viviann Spare P. Adela Lank , MD Age: 54 Referring MD:  Date of Birth: 1966-02-09 Gender: Male Account #: [id number] Procedure:                Upper GI endoscopy Indications:              Follow-up of esophageal stricture - history of                            refractory esophageal stricture - multiple savory                            dilations with 2 kenolog injections, s/p kneedle                            knife at [facility] several months ago, eventually had                            esophageal stent placed per Dr. Meridee Score in                            January. Stent has provided significant benefit,                            eating much better. Course complicated by stent                            migration leading to surgical removal a few months                            ago. History of Barrett's, here for surveillance                            EGD and to recheck patency of esophagus. On                            protonix twice daily Medicines:                Monitored Anesthesia Care Procedure:                Pre-Anesthesia Assessment:                           - Prior to the procedure, a History and Physical                            was performed, and patient medications and                            allergies were reviewed. The patient's tolerance of                            previous anesthesia was also reviewed. The risks  and benefits of the procedure and the sedation                            options and risks were discussed with the patient.                            All questions were answered, and informed consent                            was obtained. Prior Anticoagulants: The patient has                            taken no previous anticoagulant or antiplatelet                            agents. ASA Grade Assessment: III - A  patient with                            severe systemic disease. After reviewing the risks                            and benefits, the patient was deemed in                            satisfactory condition to undergo the procedure.                           After obtaining informed consent, the endoscope was                            passed under direct vision. Throughout the                            procedure, the patient's blood pressure, pulse, and                            oxygen saturations were monitored continuously. The                            Endoscope was introduced through the mouth, and                            advanced to the second part of duodenum. The upper                            GI endoscopy was accomplished without difficulty.                            The patient tolerated the procedure well. Scope In: Scope Out: Findings:                 Esophagogastric landmarks were identified: the  Z-line was found at 34 cm, the gastroesophageal                            junction was found at 37 cm and the upper extent of                            the gastric folds was found at 40 cm from the                            incisors.                           A 3 cm hiatal hernia was present.                           There were esophageal mucosal changes classified as                            Barrett's stage C2-M3 per Prague criteria present                            in the lower third of the esophagus. The maximum                            longitudinal extent of these mucosal changes was 3                            cm in length. Biopsies were taken with a cold                            forceps for histology. Stable in appearance since                            the last exam                           The site of the prior stricture starting at 30cm to                            33cm was visualized. It was widely patent without                             any significant stricturing or inflammatory                            changes, mild fibrotic changes noted. One small                            diverticulum vs. pseudodiverticulum noted around                            30cm from the incisors. No further dilation  performed.                           The exam of the esophagus was otherwise normal.                           There was evidence of a gastrostomy present in the                            gastric body. This was characterized by healthy                            appearing mucosa.                           The exam of the stomach was otherwise normal.                           The duodenal bulb and second portion of the                            duodenum were normal. Complications:            No immediate complications. Estimated blood loss:                            Minimal. Estimated Blood Loss:     Estimated blood loss was minimal. Impression:               - Esophagogastric landmarks identified.                           - 3 cm hiatal hernia.                           - Esophageal mucosal changes classified as                            Barrett's stage C2-M3 per Prague criteria. Biopsied.                           - Site of prior stricture significantly improved                            and widely patent - additional dilation not                            performed. Diverticulum vs. pseudodiverticulum                            noted at 30cm                           - Gastrostomy present characterized by healthy                            appearing mucosa.                           -  Normal stomach otherwise                           - Normal duodenal bulb and second portion of the                            duodenum. Recommendation:           - Patient has a contact number available for                            emergencies. The signs and symptoms of potential                             delayed complications were discussed with the                            patient. Return to normal activities tomorrow.                            Written discharge instructions were provided to the                            patient.                           - Resume previous diet.                           - Continue present medications, protonix 40mg  BID                           - Await pathology results. Monitor for recurrent                            dysphagia, contact me if this occurs for                            re-evaluation P. Elyan Vanwieren, MD 09/19/2019 8:17:19 AM This report has been signed electronically.

## 2019-09-19 NOTE — Progress Notes (Signed)
Patient has feeding tube to mid stomach line.

## 2019-09-19 NOTE — Progress Notes (Signed)
Report to PACU, RN, vss, BBS= Clear.  

## 2019-09-19 NOTE — Progress Notes (Signed)
Called to room to assist during endoscopic procedure.  Patient ID and intended procedure confirmed with present staff. Received instructions for my participation in the procedure from the performing physician.  

## 2019-09-19 NOTE — Patient Instructions (Signed)
Await pathology results  Continue your medications today including Protonix 40mg     YOU HAD AN ENDOSCOPIC PROCEDURE TODAY AT THE [facility]:   Refer to the procedure report that was given to you for any specific questions about what was found during the examination.  If the procedure report does not answer your questions, please call your gastroenterologist to clarify.  If you requested that your care partner not be given the details of your procedure findings, then the procedure report has been included in a sealed envelope for you to review at your convenience later.  YOU SHOULD EXPECT: Some feelings of bloating in the abdomen. Passage of more gas than usual.  Walking can help get rid of the air that was put into your GI tract during the procedure and reduce the bloating. If you had a lower endoscopy (such as a colonoscopy or flexible sigmoidoscopy) you may notice spotting of blood in your stool or on the toilet paper. If you underwent a bowel prep for your procedure, you may not have a normal bowel movement for a few days.  Please Note:  You might notice some irritation and congestion in your nose or some drainage.  This is from the oxygen used during your procedure.  There is no need for concern and it should clear up in a day or so.  SYMPTOMS TO REPORT IMMEDIATELY:   Following upper endoscopy (EGD)  Vomiting of blood or coffee ground material  New chest pain or pain under the shoulder blades  Painful or persistently difficult swallowing  New shortness of breath  Fever of 100F or higher  Black, tarry-looking stools  For urgent or emergent issues, a gastroenterologist can be reached at any hour by calling [phone]. Do not use MyChart messaging for urgent concerns.    DIET:  We do recommend a small meal at first, but then you may proceed to your regular diet.  Drink plenty of fluids but you should avoid alcoholic beverages for 24 hours.  ACTIVITY:  You should plan to  take it easy for the rest of today and you should NOT DRIVE or use heavy machinery until tomorrow (because of the sedation medicines used during the test).    FOLLOW UP: Our staff will call the number listed on your records 48-72 hours following your procedure to check on you and address any questions or concerns that you may have regarding the information given to you following your procedure. If we do not reach you, we will leave a message.  We will attempt to reach you two times.  During this call, we will ask if you have developed any symptoms of COVID 19. If you develop any symptoms (ie: fever, flu-like symptoms, shortness of breath, cough etc.) before then, please call [phone].  If you test positive for Covid 19 in the 2 weeks post procedure, please call and report this information to [phone].    If any biopsies were taken you will be contacted by phone or by letter within the next 1-3 weeks.  Please call us at [phone] if you have not heard about the biopsies in 3 weeks.    SIGNATURES/CONFIDENTIALITY: You and/or your care partner have signed paperwork which will be entered into your electronic medical record.  These signatures attest to the fact that that the information above on your After Visit Summary has been reviewed and is understood.  Full responsibility of the confidentiality of this discharge information lies with you and/or your care-partner.

## 2019-09-20 ENCOUNTER — Other Ambulatory Visit: Payer: Self-pay

## 2019-09-20 ENCOUNTER — Ambulatory Visit (INDEPENDENT_AMBULATORY_CARE_PROVIDER_SITE_OTHER): Payer: Medicaid Other | Admitting: Internal Medicine

## 2019-09-20 ENCOUNTER — Other Ambulatory Visit: Payer: Self-pay | Admitting: Internal Medicine

## 2019-09-20 VITALS — BP 171/90 | HR 82 | Temp 98.9°F | Ht 66.5 in | Wt 152.3 lb

## 2019-09-20 DIAGNOSIS — F1121 Opioid dependence, in remission: Secondary | ICD-10-CM | POA: Diagnosis not present

## 2019-09-20 DIAGNOSIS — I10 Essential (primary) hypertension: Secondary | ICD-10-CM

## 2019-09-20 MED ORDER — BUPRENORPHINE HCL-NALOXONE HCL 8-2 MG SL FILM: 1.0000 | ORAL_FILM | Freq: Two times a day (BID) | SUBLINGUAL | 0 refills | Status: DC

## 2019-09-20 MED ORDER — LISINOPRIL-HYDROCHLOROTHIAZIDE 20-25 MG PO TABS: 1.0000 | ORAL_TABLET | Freq: Every day | ORAL | 3 refills | Status: DC

## 2019-09-21 ENCOUNTER — Telehealth: Payer: Self-pay

## 2019-09-21 ENCOUNTER — Telehealth: Payer: Self-pay | Admitting: *Deleted

## 2019-09-21 NOTE — Telephone Encounter (Signed)
Left message on answering machine. 

## 2019-09-21 NOTE — Telephone Encounter (Signed)
Second attempt, left VM.  

## 2019-09-22 ENCOUNTER — Encounter: Payer: Self-pay | Admitting: Gastroenterology

## 2019-09-22 NOTE — Assessment & Plan Note (Signed)
I will add hydrochlorothiazide to his lisinopril.  Take lisinopril-hydrochlorothiazide 20-25 mg daily

## 2019-09-22 NOTE — Assessment & Plan Note (Signed)
Continue Suboxone 8-2 mg twice daily. Check urine tox today. Reviewed database.

## 2019-09-22 NOTE — Progress Notes (Signed)
   09/22/2019  Vincent Clarke presents for follow up of opioid use disorder I have reviewed the prior induction visit, follow up visits, and telephone encounters relevant to opiate use disorder (OUD) treatment.   Current daily dose: Suboxone 8 mg twice daily  Date of Induction: 05/14/18 Current follow up interval, in weeks: 4 weeks  The patient has been adherent with the buprenorphine for OUD contract.   Last UDS Result: 04/19/19 suboxone + illicit opioids  HPI: [name] is a 54 year old male, here for follow-up of opiate use disorder.  He reports he is overall doing well adherent to Suboxone which is controlling his cravings.  His medical issues have been improving he recently had a follow-up EGD which he reports was "good," he notes he is still using his feeding tube but gradually taking more more by mouth.  He remains on Protonix.  He notes that Suboxone is controlling his cravings and he has not had a relapse.  His blood pressure is elevated today he reports adherence with lisinopril, denies headache blurry vision.  Exam:   Vitals:   09/20/19 0945 09/20/19 1026  BP: (!) 172/97 (!) 171/90  Pulse: 85 82  Temp: 98.9 F (37.2 C)   TempSrc: Oral   SpO2: 99% 100%  Weight: 152 lb 4.8 oz (69.1 kg)   Height: 5' 6.5" (1.689 m)    General: NAD Pulm: normal effort, no wheezing Psych: non axious  Assessment/Plan:  See Problem Based Charting in the Encounters Tab  Gust Rung, DO  09/22/2019  3:27 PM

## 2019-09-25 LAB — TOXASSURE SELECT,+ANTIDEPR,UR

## 2019-09-26 MED FILL — SUBOXONE 8 MG-2 MG SL FILM: 8-2 | 30 days supply | Qty: 60 | Fill #0

## 2019-10-03 IMAGING — CT CT ABD-PELV W/ CM
2 of 5 series · 16 of 46 positions shown, 18 images · IV contrast (omnipaque)
Comparison: 05/10/2018

CLINICAL DATA: Mid abdominal pain. Recent gastrostomy tube
placement.

EXAM:
CT ABDOMEN AND PELVIS WITH CONTRAST
TECHNIQUE: Multidetector CT imaging of the abdomen and pelvis was performed
using the standard protocol following bolus administration of
intravenous contrast.
CONTRAST:  100mL OMNIPAQUE IOHEXOL 300 MG/ML  SOLN

[Series 3: a/p w/ 5mm · axial · 0.68mm/px · z∈[+725,+1140]mm · 13 of 93 slices shown, 15 images]
[im 5/93  soft-tissue]
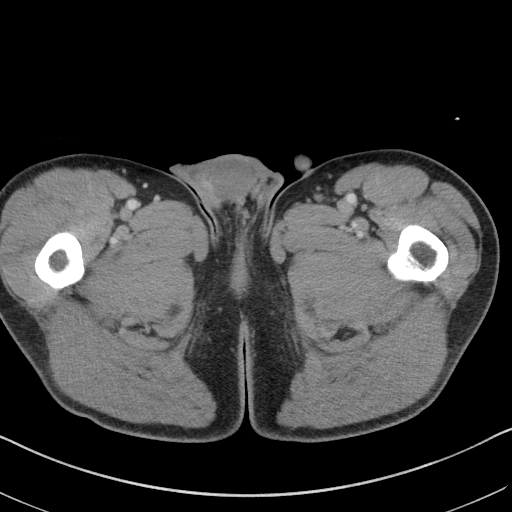
[im 5/93  bone]
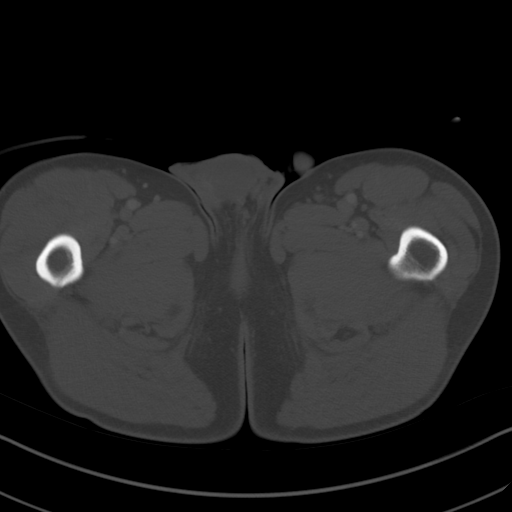
[im 15/93  soft-tissue]
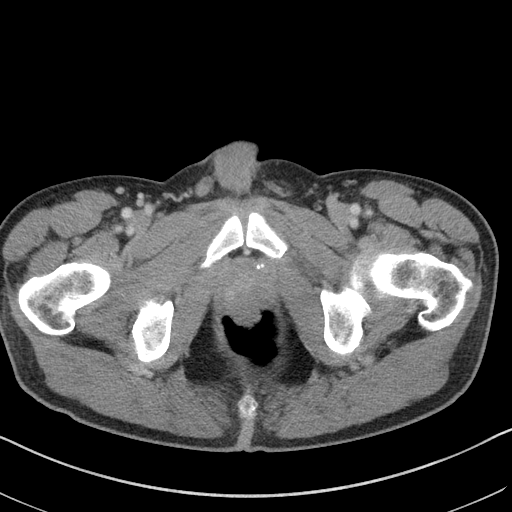
[im 20/93  soft-tissue]
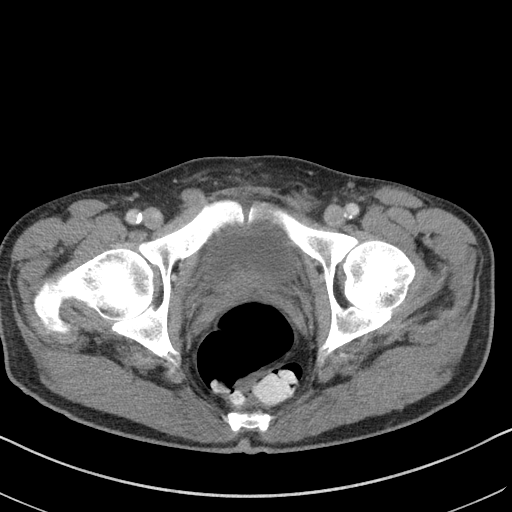
[im 25/93  soft-tissue]
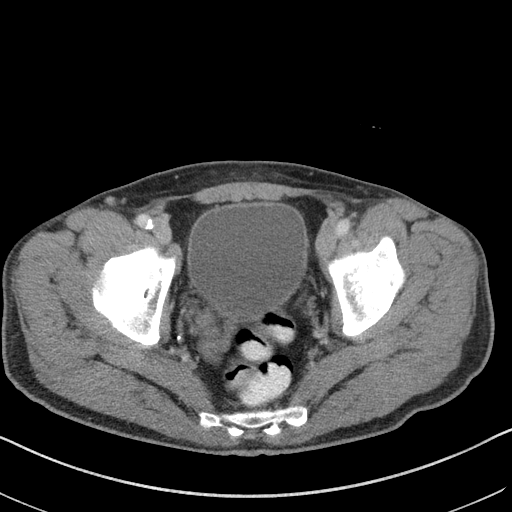
[im 34/93  soft-tissue]
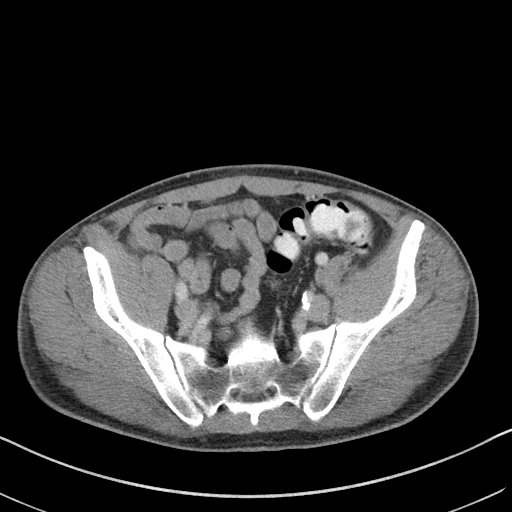
[im 39/93  soft-tissue]
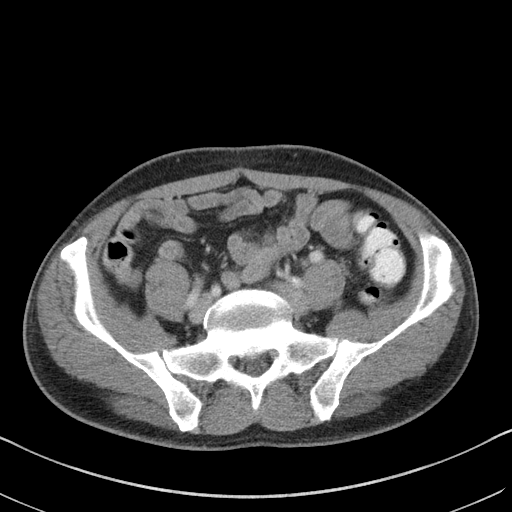
[im 49/93  soft-tissue]
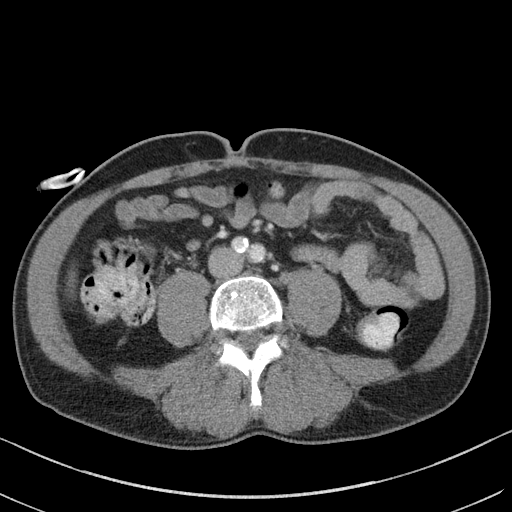
[im 54/93  soft-tissue]
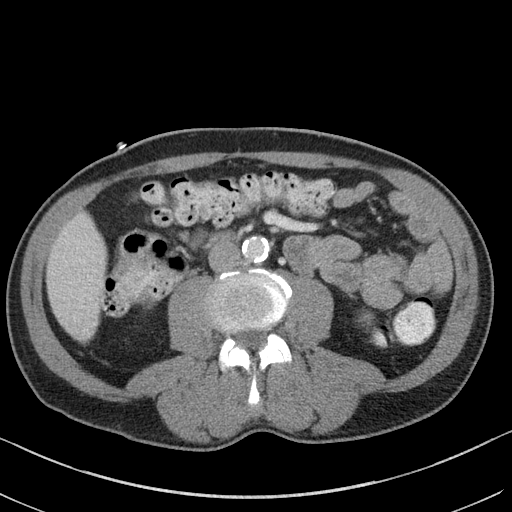
[im 59/93  soft-tissue]
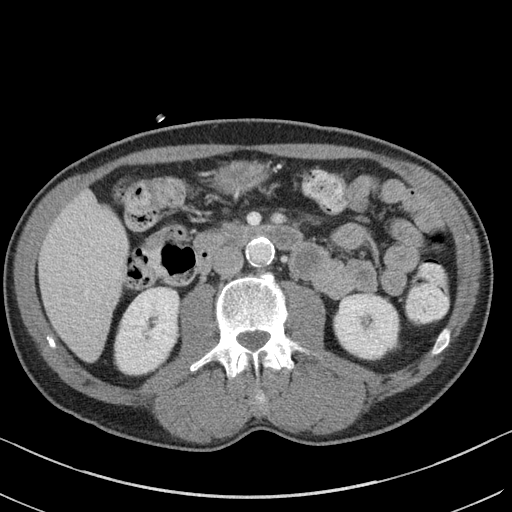
[im 59/93  bone]
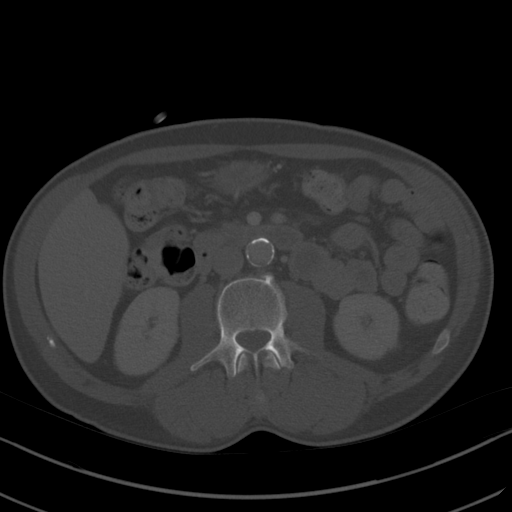
[im 68/93  soft-tissue]
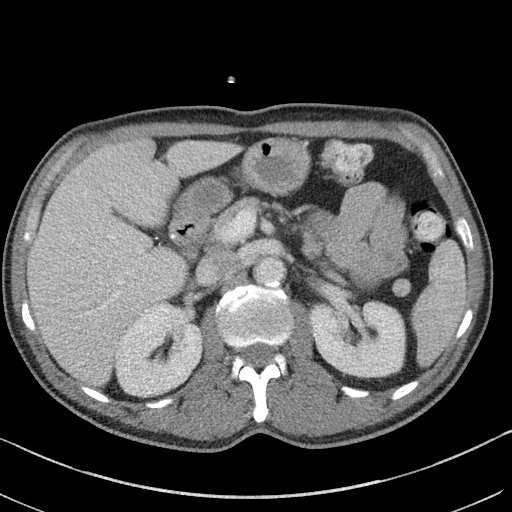
[im 73/93  soft-tissue]
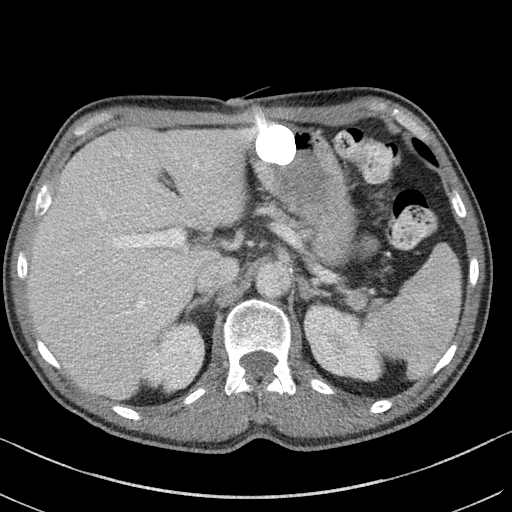
[im 78/93  soft-tissue]
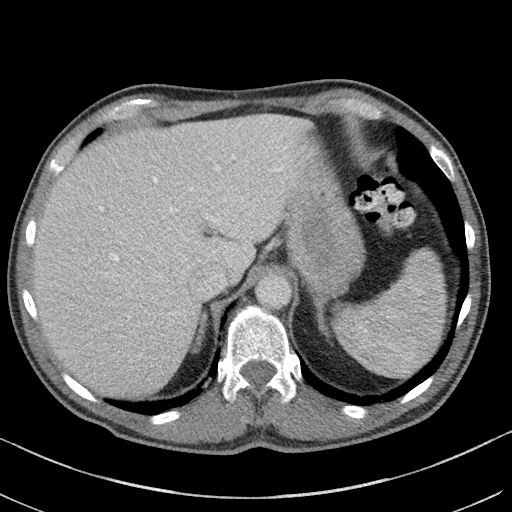
[im 88/93  soft-tissue]
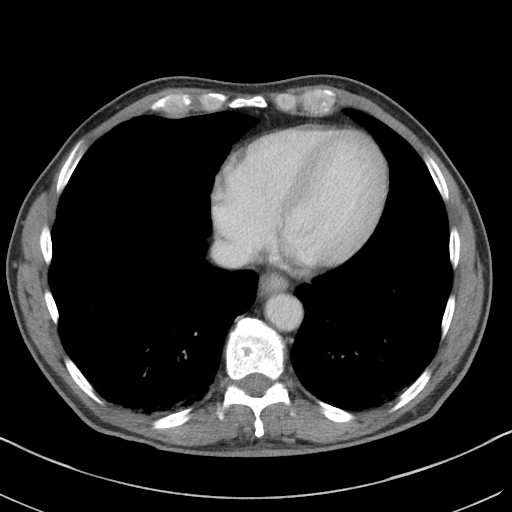

[Series 6: a/p w/ cor · coronal · 0.76mm/px · 3 of 142 slices shown]
[im 48/142  soft-tissue]
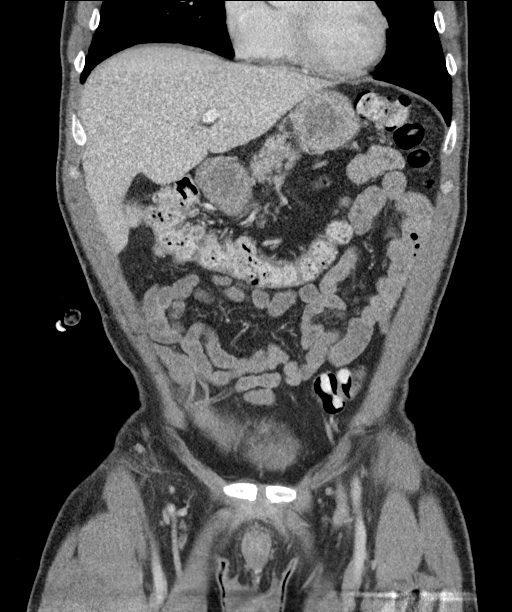
[im 63/142  soft-tissue]
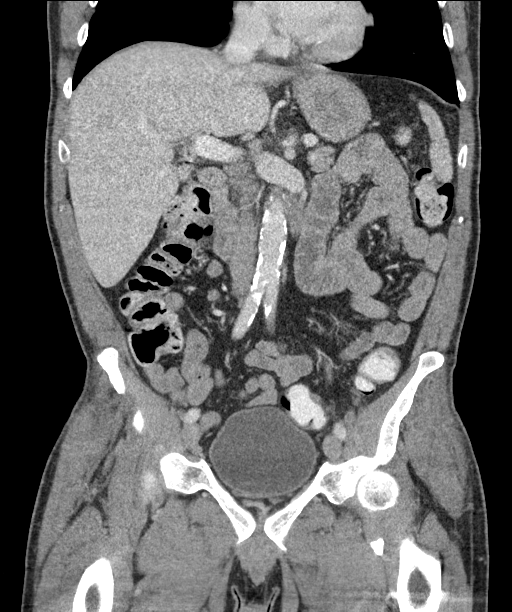
[im 79/142  soft-tissue]
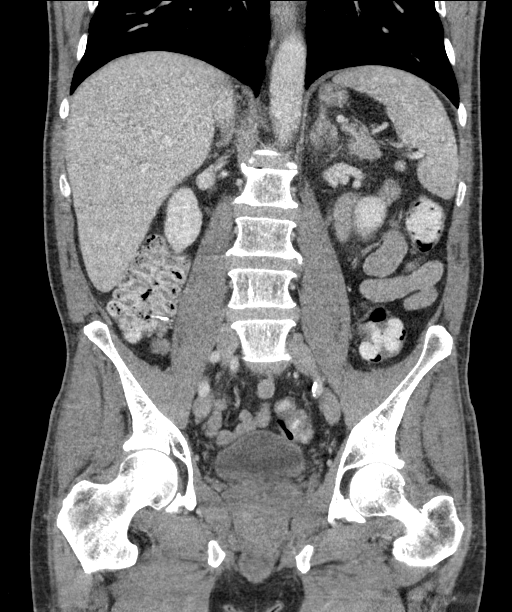

[16 of 46 positions shown; findings below may reference images not displayed]

FINDINGS: Lower chest: No acute abnormality.

Hepatobiliary: Previous cholecystectomy. No biliary ductal
dilatation. Normal appearance of the liver.

Pancreas: Unremarkable. No pancreatic ductal dilatation or
surrounding inflammatory changes.

Spleen: Normal in size without focal abnormality.

Adrenals/Urinary Tract: Normal appearance of the adrenal glands. The
kidneys are unremarkable. No hydronephrosis identified. Urinary
bladder appears within normal limits.

Stomach/Bowel: Left upper quadrant gastrostomy tube is identified.
Retention balloon is within the gastric fundus. No complications
identified. The small bowel loops have a normal course and caliber.
No pathologic dilatation of the colon. The T tacks from gastrostomy
tube placement are identified within the proximal colon. The
appendix is visualized and appears normal.

Vascular/Lymphatic: Aortic atherosclerosis. No aneurysm. No
abdominal adenopathy identified. No pelvic or inguinal adenopathy.

Reproductive: Prostate is unremarkable.

Other: No abdominal wall hernia or abnormality. No abdominopelvic
ascites. No pneumoperitoneum identified.

Musculoskeletal: No acute or significant osseous findings.
IMPRESSION: 1. No acute findings identified within the abdomen or pelvis.
2. Status post gastrostomy tube placement. No complications
identified.
3. Previous cholecystectomy.

## 2019-10-11 MED FILL — LISINOPRIL-HCTZ 20-25 MG TA: 20-25 | 90 days supply | Qty: 90 | Fill #0

## 2019-10-18 ENCOUNTER — Encounter: Payer: Self-pay | Admitting: Internal Medicine

## 2019-10-18 ENCOUNTER — Ambulatory Visit (INDEPENDENT_AMBULATORY_CARE_PROVIDER_SITE_OTHER): Payer: Medicaid Other | Admitting: Internal Medicine

## 2019-10-18 ENCOUNTER — Other Ambulatory Visit: Payer: Self-pay

## 2019-10-18 DIAGNOSIS — F1121 Opioid dependence, in remission: Secondary | ICD-10-CM | POA: Diagnosis not present

## 2019-10-18 MED ORDER — BUPRENORPHINE HCL-NALOXONE HCL 8-2 MG SL FILM: 1.0000 | ORAL_FILM | Freq: Two times a day (BID) | SUBLINGUAL | 0 refills | Status: DC

## 2019-10-18 NOTE — Progress Notes (Signed)
    This is a telephone encounter between [employer] and [employer] on 10/18/2019 for OUD. The visit was conducted with the patient located at home and Reymundo Poll at [facility]. The patient's identity was confirmed using their DOB and current address. The patient has consented to being evaluated through a telephone encounter and understands the associated risks (an examination cannot be done and the patient may need to come in for an appointment) / benefits (allows the patient to remain at home, decreasing exposure to coronavirus). I personally spent 10 minutes on medical discussion.    Vincent Clarke presents for follow up of opioid use disorder I have reviewed the prior induction visit, follow up visits, and telephone encounters relevant to opiate use disorder (OUD) treatment.   Current daily dose: Suboxone 8 mg twice daily  Date of Induction: 05/14/18  Current follow up interval, in weeks: 4 weeks  The patient has not been adherent with the buprenorphine for OUD contract.   Last UDS Result: 09/20/19 Appropriate for buprenorphine and metabolite but inappropriate for morphine, fentanyl, and metabolites   HPI: For the details of today's visit, please refer to the assessment and plan.   Review of Systems  Constitutional: Negative for chills and fever.  Cardiovascular: Negative for chest pain.     Assessment & Plan:   See Encounters Tab for problem based charting.

## 2019-10-18 NOTE — Assessment & Plan Note (Addendum)
Called patient to follow up on OUD and suboxone treatment. He reports doing well, currently taking 8-2mg  films BID. We discussed his last UDS being inappropriate for morphine and fentanyl. He admits to multiple relapses, most recently two weeks ago. He reports multiple stressors including his wife who has been sick. Looking back through his chart, He has not had an appropriate Utox in over a year (07/20/18). I reviewed with him our expectations in our controlled substance contract, and that if we are unable to achieve remission he may require a higher level of care. At this point, I do think it's reasonable to continue treatment as a risk reduction strategy. He reports his last heroin use was 2 weeks ago and has been compliant with his buprenorphine since. Will continue with current therapy and plan for close follow up. Unfortunately patient is out of town and will not return until July 25th. His son is planning to pick up his prescription and bring it to him. I have sent a 2 week refill to his pharmacy. Will plan on telehealth follow up in two weeks, followed by an in person appointment on July 27th when he is back in town. Reviewed database, refill history appropriate.

## 2019-10-19 ENCOUNTER — Other Ambulatory Visit: Payer: Self-pay | Admitting: Gastroenterology

## 2019-10-26 MED FILL — SUBOXONE 8 MG-2 MG SL FILM: 8-2 | 13 days supply | Qty: 25 | Fill #0

## 2019-10-27 DIAGNOSIS — R1310 Dysphagia, unspecified: Secondary | ICD-10-CM | POA: Diagnosis not present

## 2019-10-27 DIAGNOSIS — K222 Esophageal obstruction: Secondary | ICD-10-CM | POA: Diagnosis not present

## 2019-11-01 ENCOUNTER — Other Ambulatory Visit: Payer: Self-pay

## 2019-11-01 ENCOUNTER — Ambulatory Visit (INDEPENDENT_AMBULATORY_CARE_PROVIDER_SITE_OTHER): Payer: Medicaid Other | Admitting: Student in an Organized Health Care Education/Training Program

## 2019-11-01 DIAGNOSIS — F1121 Opioid dependence, in remission: Secondary | ICD-10-CM | POA: Diagnosis not present

## 2019-11-01 MED ORDER — BUPRENORPHINE HCL-NALOXONE HCL 8-2 MG SL FILM: 1.0000 | ORAL_FILM | Freq: Two times a day (BID) | SUBLINGUAL | 0 refills | Status: DC

## 2019-11-01 NOTE — Assessment & Plan Note (Signed)
Chronic, not yet stabilized.  Tox assure still had heroin, struggles with heroin use a couple times a week when he is in [place].  Doing better now that he is in [place] away from drug use environment.  Plan to continue with Suboxone 8 mg films twice a day.  I sent a 1 month supply to give him some flexibility on his travel.  His son is going to pick it up today and bring it down to him in [place].  Plan for him to follow-up with [place] in 1 month in person, check a urine tox assure at that time.  I reviewed the PDMP which was appropriate.
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
## 2019-11-08 MED FILL — SUBOXONE 8 MG-2 MG SL FILM: 8-2 | 30 days supply | Qty: 60 | Fill #0

## 2019-11-14 MED FILL — PANTOPRAZOLE SOD DR 40 MG T: 40 | 30 days supply | Qty: 60 | Fill #1

## 2019-12-06 ENCOUNTER — Ambulatory Visit (INDEPENDENT_AMBULATORY_CARE_PROVIDER_SITE_OTHER): Payer: Medicaid Other | Admitting: Internal Medicine

## 2019-12-06 ENCOUNTER — Other Ambulatory Visit: Payer: Self-pay

## 2019-12-06 VITALS — BP 123/77 | HR 80 | Temp 98.2°F | Ht 66.0 in | Wt 150.0 lb

## 2019-12-06 DIAGNOSIS — I10 Essential (primary) hypertension: Secondary | ICD-10-CM | POA: Diagnosis not present

## 2019-12-06 DIAGNOSIS — K222 Esophageal obstruction: Secondary | ICD-10-CM

## 2019-12-06 DIAGNOSIS — F1121 Opioid dependence, in remission: Secondary | ICD-10-CM | POA: Diagnosis not present

## 2019-12-06 MED ORDER — BUPRENORPHINE HCL-NALOXONE HCL 8-2 MG SL FILM: 1.0000 | ORAL_FILM | Freq: Two times a day (BID) | SUBLINGUAL | 0 refills | Status: DC

## 2019-12-06 NOTE — Assessment & Plan Note (Signed)
Mr Vincent Clarke is following up today for his opioid use disorder. He endorses that he was doing really well since being in [place]. He endorses last relapse was about one month ago when he was in [place] and he had to get away and go see family in [place]. Financial stressors have been playing a role in his relapses as well. He continues with suboxone 8mg  films twice daily and notes that it is controlling his cravings well. However, his social situation is main factor for relapses. He notes working as for 20+ years.  UDS collected today  Plan: 4 week supply of suboxone 8-2mg  bid sent to pharmacy  F/u in 4 weeks

## 2019-12-06 NOTE — Assessment & Plan Note (Signed)
Patient s/p esophageal stricture with stenting and hospitalization in February 2021 for SBO secondary to esphageal stent dislodgement to the distal jejunum for which he underwent laparoscopic-assistewd removal of foreign body. He notes he has been doing well and has been tolerating diet. He denies any nausea and vomiting and is having normal bowel movements. He is able to tolerate hamburgers well. Abdomen soft and nontender on examination.  Plan: Continue to monitor

## 2019-12-06 NOTE — Assessment & Plan Note (Signed)
Currently on Lisinopril-HCTZ 20-25mg  daily.  BP Readings from Last 3 Encounters:  12/06/19 123/77  09/20/19 (!) 171/90  09/19/19 122/78   Currently well controlled and asymptomatic.   Plan: Continue Zestoretic 20-25mg  daily

## 2019-12-06 NOTE — Progress Notes (Signed)
Internal Medicine Clinic Attending  I saw and evaluated the patient.  I personally confirmed the key portions of the history and exam documented by Dr. Aslam and I reviewed pertinent patient test results.  The assessment, diagnosis, and plan were formulated together and I agree with the documentation in the resident's note.     

## 2019-12-06 NOTE — Progress Notes (Signed)
   12/06/2019  [name] presents for follow up of opioid use disorder I have reviewed the prior induction visit, follow up visits, and telephone encounters relevant to opiate use disorder (OUD) treatment.   Current daily dose: Suboxone 8-2mg  bid   Date of Induction: 05/14/2018  Current follow up interval, in weeks: 4 weeks   The patient has been adherent with the buprenorphine for OUD contract.   Last UDS Result: 12/06/2019  HPI: [name] is a 54 year old male presenting for follow up of opioid use disorder. He notes most recent relapse one month ago due to financial reasons and other personal reasons. Since then, he has been in [place] to get out of the environment and notes he has done well. No acute concerns at this time. Please see problem based charting for complete assessment and plan.   Exam:   Vitals:   12/06/19 0845  BP: 123/77  Pulse: 80  Temp: 98.2 F (36.8 C)  TempSrc: Oral  SpO2: 100%  Weight: 150 lb (68 kg)  Height: 5\' 6"  (1.676 m)    Physical Exam  Constitutional: Appears well-developed and well-nourished. No distress.  Cardiovascular: Normal rate, regular rhythm, S1 and S2 present, no murmurs, rubs, gallops.  Distal pulses intact Respiratory: No respiratory distress, no accessory muscle use.  Effort is normal.  Lungs are clear to auscultation bilaterally. Musculoskeletal: Normal bulk and tone.  No peripheral edema noted. Neurological: Is alert and oriented x4, no apparent focal deficits noted. Psychiatric: Normal mood and affect. Behavior is normal. Judgment and thought content normal.    Assessment/Plan:  See Problem Based Charting in the Encounters Tab   , MD IMTS PGY-2 12/06/2019  9:09 AM

## 2019-12-08 MED FILL — SUBOXONE 8 MG-2 MG SL FILM: 8-2 | 30 days supply | Qty: 60 | Fill #0

## 2019-12-08 MED FILL — PANTOPRAZOLE SOD DR 40 MG T: 40 | 30 days supply | Qty: 60 | Fill #2

## 2019-12-09 LAB — TOXASSURE SELECT,+ANTIDEPR,UR

## 2019-12-27 DIAGNOSIS — R1310 Dysphagia, unspecified: Secondary | ICD-10-CM | POA: Diagnosis not present

## 2019-12-27 DIAGNOSIS — K222 Esophageal obstruction: Secondary | ICD-10-CM | POA: Diagnosis not present

## 2020-01-01 IMAGING — CT CT ABDOMEN AND PELVIS WITH CONTRAST
2 of 5 series · 16 of 46 positions shown, 18 images · IV contrast (Omni 300)
Comparison: 07/14/2018

CLINICAL DATA: Acute abdominal pain.

EXAM:
CT ABDOMEN AND PELVIS WITH CONTRAST
TECHNIQUE: Multidetector CT imaging of the abdomen and pelvis was performed
using the standard protocol following bolus administration of
intravenous contrast.
CONTRAST:  100mL OMNIPAQUE IOHEXOL 300 MG/ML  SOLN

[Series 3: a/p w/ 5mm · axial · 0.75mm/px · z∈[+702,+1112]mm · 13 of 92 slices shown, 15 images]
[im 5/92  soft-tissue]
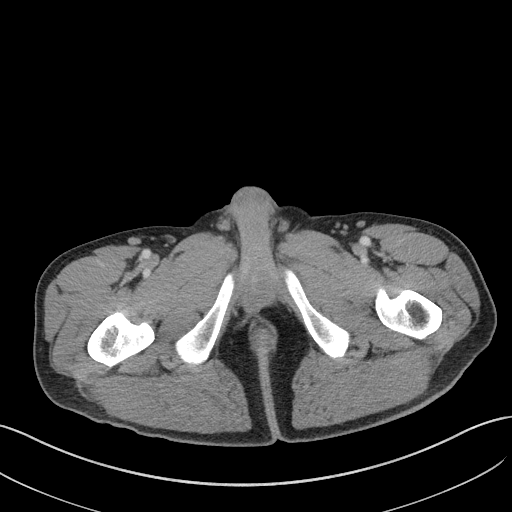
[im 5/92  bone]
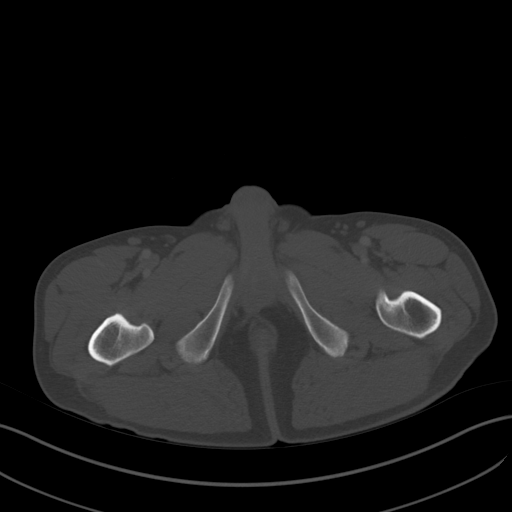
[im 15/92  soft-tissue]
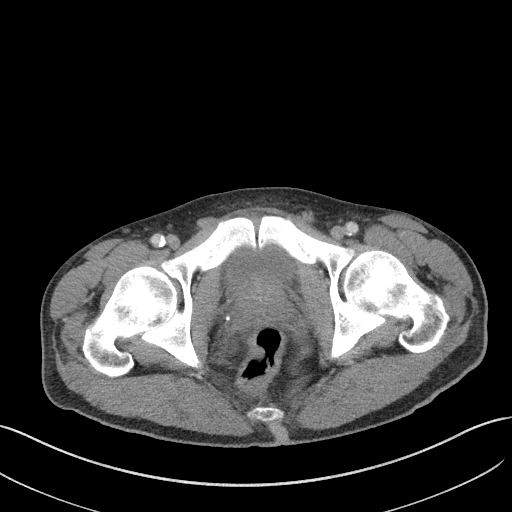
[im 20/92  soft-tissue]
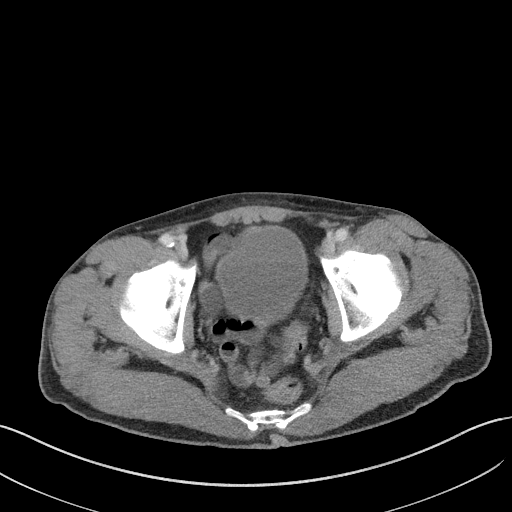
[im 24/92  soft-tissue]
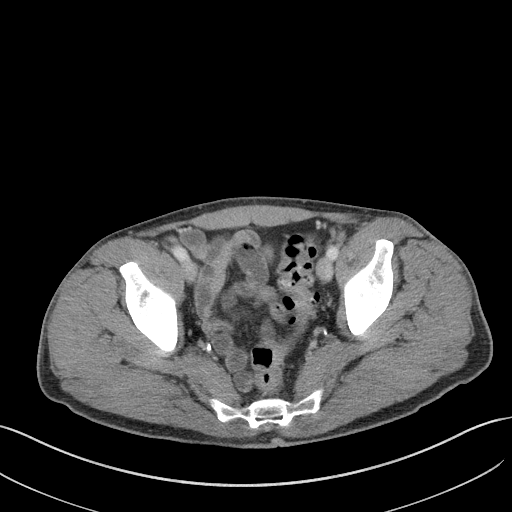
[im 34/92  soft-tissue]
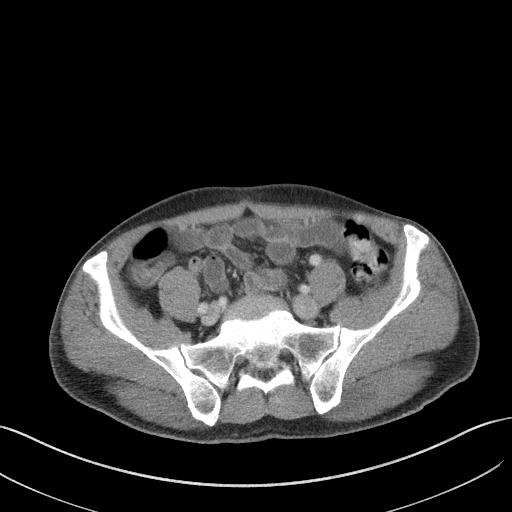
[im 39/92  soft-tissue]
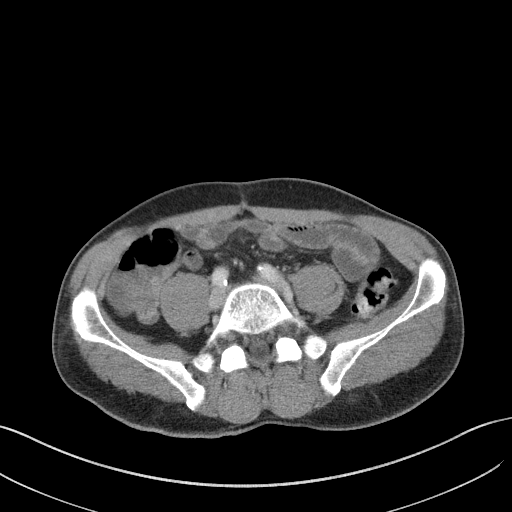
[im 48/92  soft-tissue]
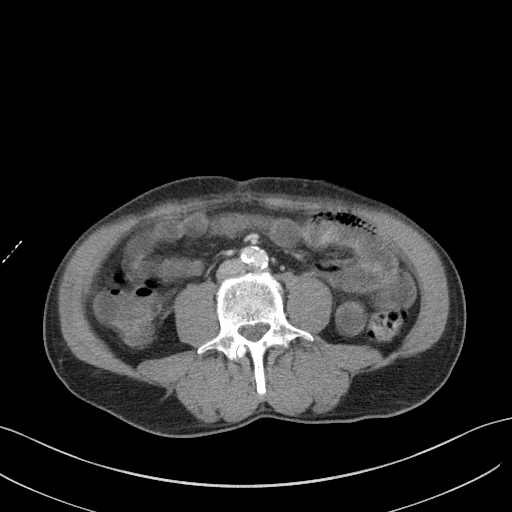
[im 53/92  soft-tissue]
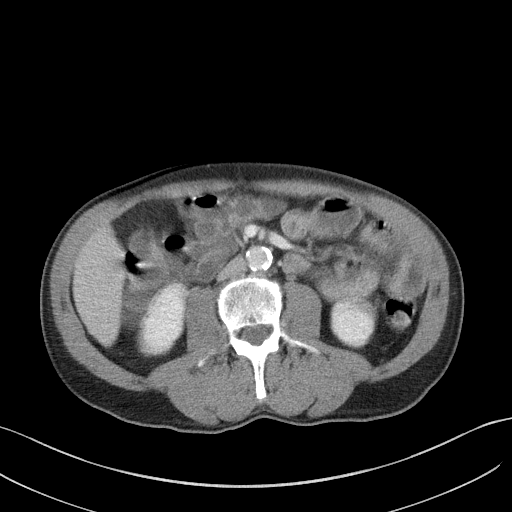
[im 58/92  soft-tissue]
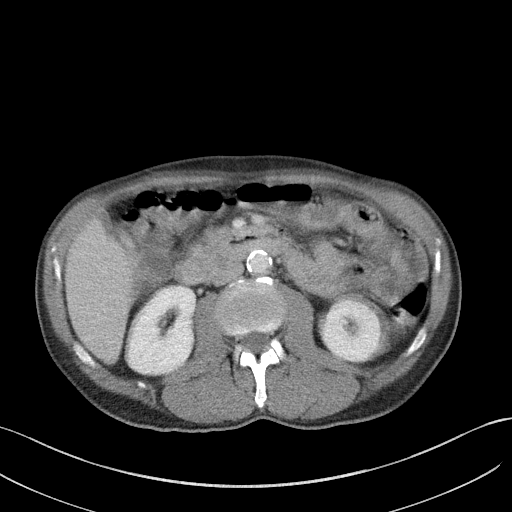
[im 58/92  bone]
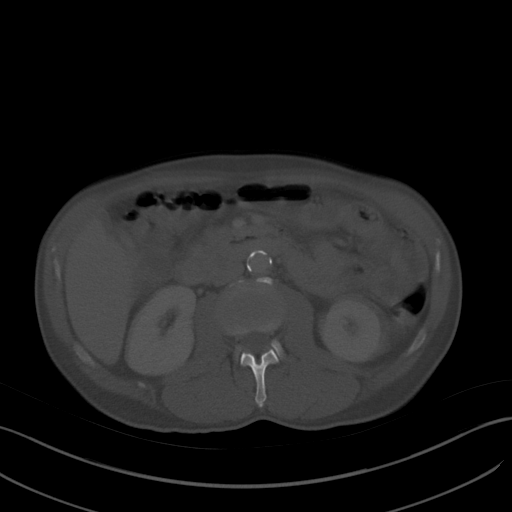
[im 68/92  soft-tissue]
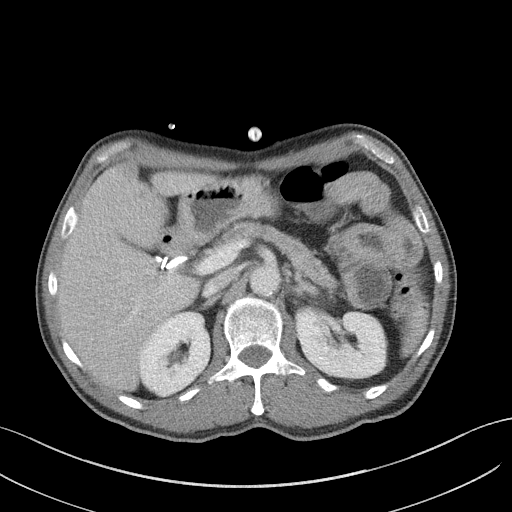
[im 72/92  soft-tissue]
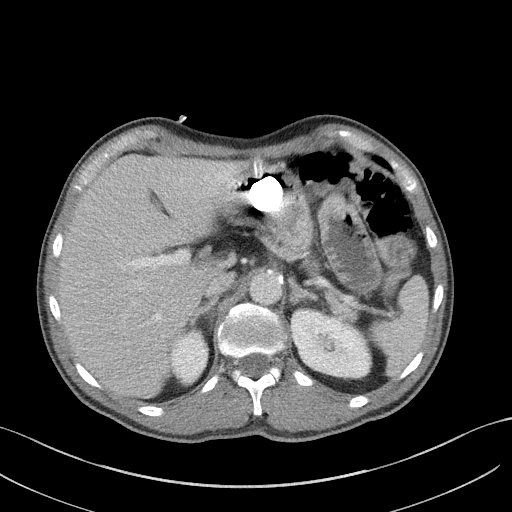
[im 77/92  soft-tissue]
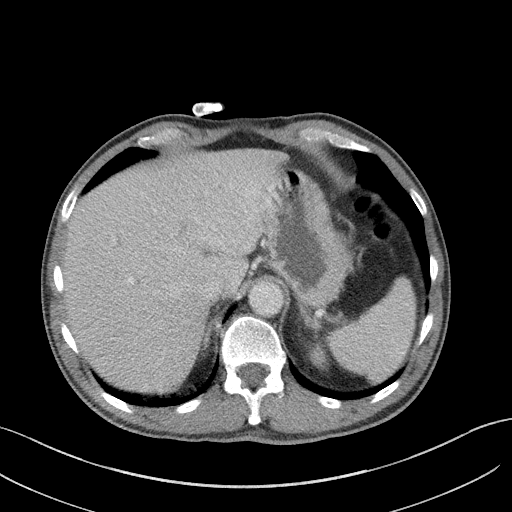
[im 87/92  soft-tissue]
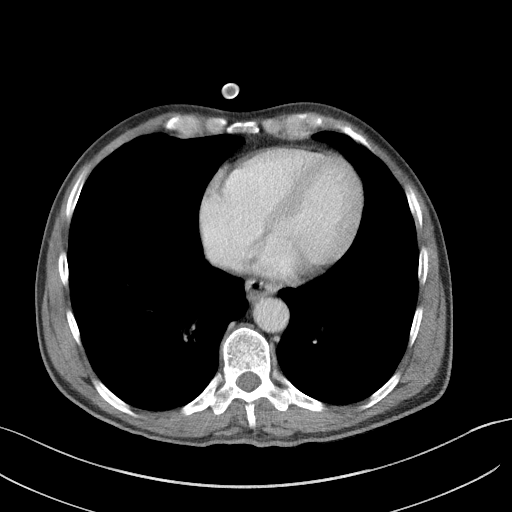

[Series 6: a/p w/ cor · coronal · 0.67mm/px · 3 of 131 slices shown]
[im 44/131  soft-tissue]
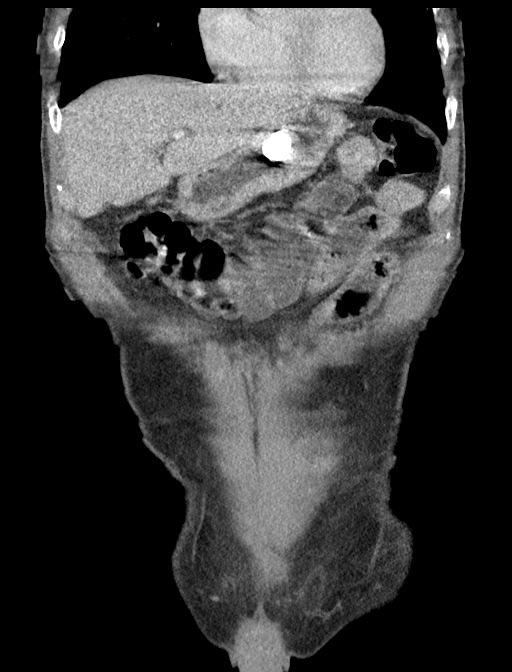
[im 58/131  soft-tissue]
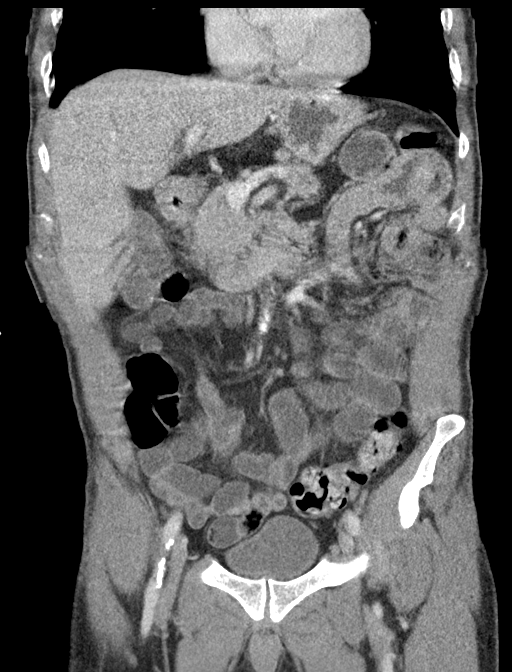
[im 73/131  soft-tissue]
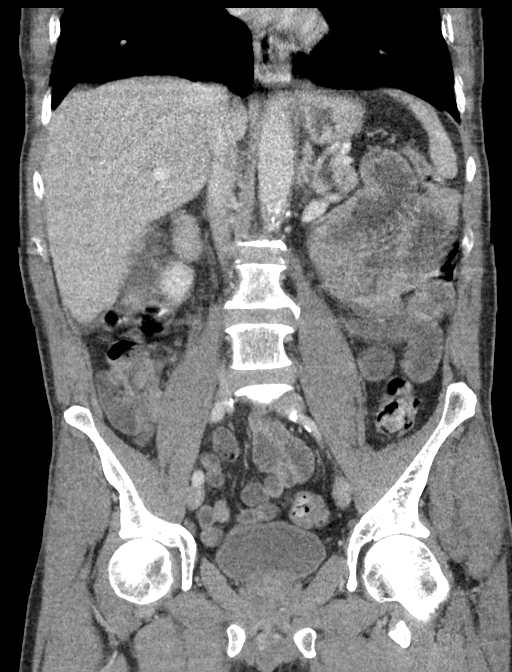

[16 of 46 positions shown; findings below may reference images not displayed]

FINDINGS: Lower chest: Heart size and vascularity are normal. Minimal
atelectasis at the right lung base posteriorly.

Hepatobiliary: No focal liver abnormality. Status post
cholecystectomy. No biliary dilatation.

Pancreas: Normal.

Spleen: Normal.

Adrenals/Urinary Tract: Normal adrenal glands, kidneys, and bladder.

Stomach/Bowel: Gastrostomy tube appears in good position in the
stomach. Stomach is otherwise normal. There are several slightly
distended small bowel loops in the left mid abdomen. Colon appears
normal. Distal small bowel is normal. Appendix has been removed.

Vascular/Lymphatic: Aortic atherosclerosis. No enlarged abdominal or
pelvic lymph nodes.

Reproductive: Prostate is unremarkable.

Other: No free air or free fluid or abdominal wall hernia.

Musculoskeletal: Normal.
IMPRESSION: Slightly dilated small bowel loops in the left mid abdomen. This
could represent a partial or early complete small bowel obstruction.

Gastrostomy tube in good position.

Aortic atherosclerosis ICD 10-HMM.M)

## 2020-01-02 ENCOUNTER — Other Ambulatory Visit: Payer: Self-pay | Admitting: Gastroenterology

## 2020-01-02 MED FILL — PANTOPRAZOLE SOD DR 40 MG T: 40 | 30 days supply | Qty: 60 | Fill #0

## 2020-01-03 ENCOUNTER — Other Ambulatory Visit: Payer: Self-pay

## 2020-01-03 ENCOUNTER — Ambulatory Visit (INDEPENDENT_AMBULATORY_CARE_PROVIDER_SITE_OTHER): Payer: Medicaid Other | Admitting: Internal Medicine

## 2020-01-03 DIAGNOSIS — F1121 Opioid dependence, in remission: Secondary | ICD-10-CM | POA: Diagnosis not present

## 2020-01-03 IMAGING — CR PORTABLE ABDOMEN - 1 VIEW
1 series · 1 of 1 positions shown · non-contrast
Comparison: 10/13/2018

CLINICAL DATA: Follow up small bowel obstruction

EXAM:
PORTABLE ABDOMEN - 1 VIEW

[AP]
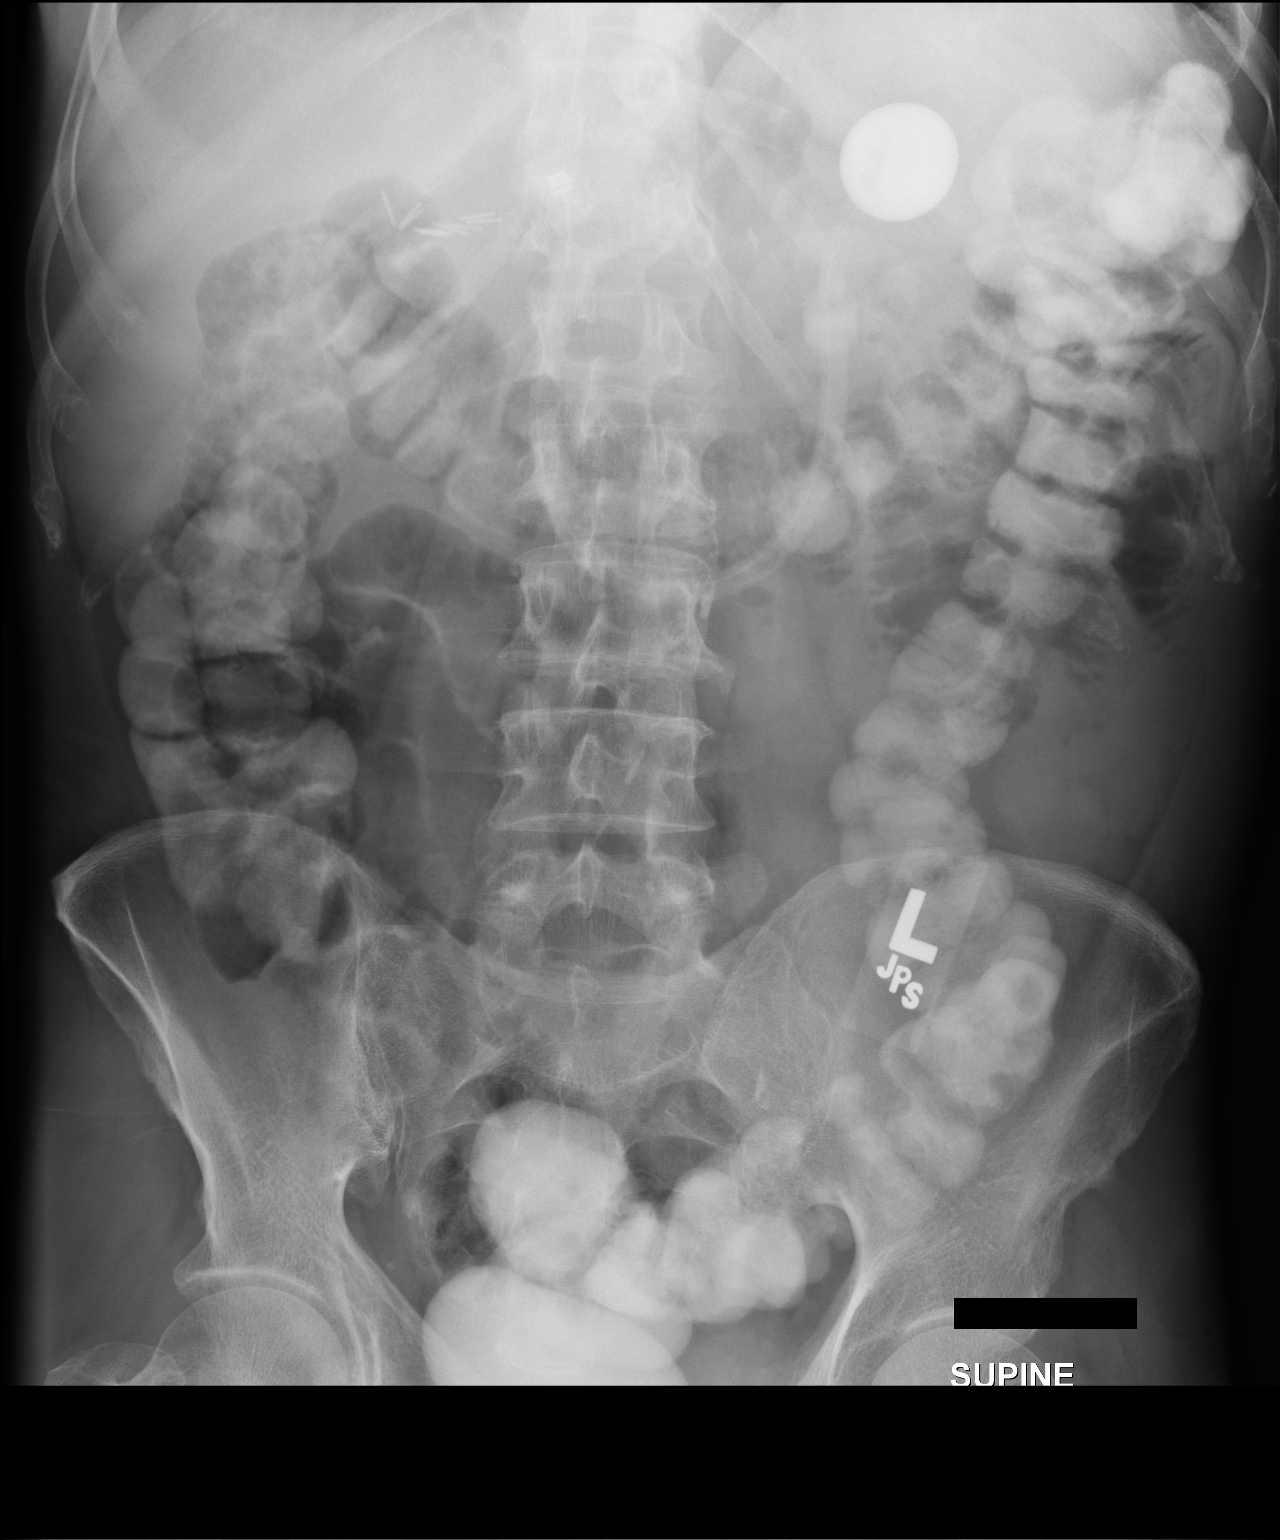

[1 of 1 positions shown; findings below may reference images not displayed]

FINDINGS: Contrast material is noted scattered throughout the colon.
Previously seen residual in the small bowel has resolved.
Gastrostomy catheter is noted.
IMPRESSION: Contrast now lies completely within the colon when compared with the
previous exam. No definitive obstructive changes are seen.

## 2020-01-03 NOTE — Assessment & Plan Note (Signed)
Refilled Suboxone prescription and harm reduction strategy.  Continue to work on complete abstinence from not buprenorphine opioids.  Follow-up in person in 4 weeks.  Continue dose of 8-2 mg twice daily
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
## 2020-01-09 ENCOUNTER — Other Ambulatory Visit: Payer: Self-pay | Admitting: Internal Medicine

## 2020-01-09 DIAGNOSIS — F1121 Opioid dependence, in remission: Secondary | ICD-10-CM

## 2020-01-09 MED ORDER — BUPRENORPHINE HCL-NALOXONE HCL 8-2 MG SL FILM: 1.0000 | ORAL_FILM | Freq: Two times a day (BID) | SUBLINGUAL | 0 refills | Status: DC

## 2020-01-09 MED FILL — BUPRENORPHINE HCL-NALOXONE: 8-2 | 30 days supply | Qty: 60 | Fill #0

## 2020-01-09 NOTE — Telephone Encounter (Signed)
Need refill on suboexe ,pt contact [phone]  [facility] - [place], [place] - [address] [facility].

## 2020-01-10 MED FILL — LISINOPRIL-HCTZ 20-25 MG TA: 20-25 | 90 days supply | Qty: 90 | Fill #1

## 2020-01-12 ENCOUNTER — Other Ambulatory Visit: Payer: Self-pay | Admitting: *Deleted

## 2020-01-12 DIAGNOSIS — F1121 Opioid dependence, in remission: Secondary | ICD-10-CM

## 2020-01-24 MED FILL — LISINOPRIL-HCTZ 20-25 MG TA: 20-25 | 90 days supply | Qty: 90 | Fill #1

## 2020-01-25 MED FILL — PANTOPRAZOLE SOD DR 40 MG T: 40 | 30 days supply | Qty: 60 | Fill #1

## 2020-02-06 ENCOUNTER — Other Ambulatory Visit: Payer: Self-pay | Admitting: Internal Medicine

## 2020-02-06 DIAGNOSIS — F1121 Opioid dependence, in remission: Secondary | ICD-10-CM

## 2020-02-06 MED ORDER — BUPRENORPHINE HCL-NALOXONE HCL 8-2 MG SL FILM: 1.0000 | ORAL_FILM | Freq: Two times a day (BID) | SUBLINGUAL | 0 refills | Status: DC

## 2020-02-06 NOTE — Telephone Encounter (Signed)
Need refill on Buprenorphine HCl-Naloxone HCl 8-2 MG FILM ;pt contact [phone]  Pt took last pill this morning   [facility] [place] - [address] [facility].

## 2020-02-07 ENCOUNTER — Telehealth: Payer: Self-pay | Admitting: Internal Medicine

## 2020-02-07 MED FILL — BUPRENORPHINE HCL-NALOXONE: 8-2 | 30 days supply | Qty: 60 | Fill #0

## 2020-02-07 NOTE — Telephone Encounter (Signed)
Refill Request-Pt states he is out of his Meds.  Buprenorphine HCl-Naloxone HCl 8-2 MG FILM  [facility] - [place], [place] - [address]

## 2020-02-07 NOTE — Telephone Encounter (Signed)
#  60 sent yesterday. Patient notified and is very Adult nurse. Kinnie Feil, BSN, RN-BC

## 2020-02-14 ENCOUNTER — Other Ambulatory Visit: Payer: Self-pay | Admitting: Student in an Organized Health Care Education/Training Program

## 2020-02-14 ENCOUNTER — Ambulatory Visit (INDEPENDENT_AMBULATORY_CARE_PROVIDER_SITE_OTHER): Payer: Medicaid Other | Admitting: Student in an Organized Health Care Education/Training Program

## 2020-02-14 ENCOUNTER — Other Ambulatory Visit: Payer: Self-pay

## 2020-02-14 DIAGNOSIS — F1121 Opioid dependence, in remission: Secondary | ICD-10-CM | POA: Diagnosis not present

## 2020-02-14 MED ORDER — BUPRENORPHINE HCL-NALOXONE HCL 8-2 MG SL FILM: 1.0000 | ORAL_FILM | Freq: Two times a day (BID) | SUBLINGUAL | 0 refills | Status: DC
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
## 2020-02-14 NOTE — Assessment & Plan Note (Signed)
Symptomatically stable but not yet well controlled.  Still having relapses a few times a week.  This is consistent with his last urine toxicology screen.  However he is having good compliance with the Suboxone.  I think we are in a risk reduction strategy right now.  We set a goal of not having any further relapses over the next 6 weeks.  We will continue with Suboxone, last filled on 9/28.  I sent another 4-week supply that he can fill at the end of October.  Then want him to follow-up with [place] in person towards the end of November before that prescription runs out.  We will need a toxicology at that time.

## 2020-02-20 MED FILL — PANTOPRAZOLE SOD DR 40 MG T: 40 | 30 days supply | Qty: 60 | Fill #2

## 2020-02-28 DIAGNOSIS — R1310 Dysphagia, unspecified: Secondary | ICD-10-CM | POA: Diagnosis not present

## 2020-02-28 DIAGNOSIS — K222 Esophageal obstruction: Secondary | ICD-10-CM | POA: Diagnosis not present

## 2020-03-07 MED FILL — SUBOXONE 8 MG-2 MG SL FILM: 8-2 | 30 days supply | Qty: 60 | Fill #0

## 2020-03-16 ENCOUNTER — Other Ambulatory Visit: Payer: Self-pay | Admitting: Gastroenterology

## 2020-03-16 MED FILL — PANTOPRAZOLE SOD DR 40 MG T: 40 | 30 days supply | Qty: 60 | Fill #0

## 2020-03-16 MED FILL — traZODone HCL 100 MG TABS: 100 | 30 days supply | Qty: 30 | Fill #1

## 2020-03-27 DIAGNOSIS — K222 Esophageal obstruction: Secondary | ICD-10-CM | POA: Diagnosis not present

## 2020-03-27 DIAGNOSIS — R1310 Dysphagia, unspecified: Secondary | ICD-10-CM | POA: Diagnosis not present

## 2020-04-03 ENCOUNTER — Ambulatory Visit (INDEPENDENT_AMBULATORY_CARE_PROVIDER_SITE_OTHER): Payer: Medicaid Other | Admitting: Internal Medicine

## 2020-04-03 ENCOUNTER — Other Ambulatory Visit: Payer: Self-pay | Admitting: Internal Medicine

## 2020-04-03 ENCOUNTER — Other Ambulatory Visit: Payer: Self-pay

## 2020-04-03 VITALS — BP 116/59 | HR 65 | Temp 97.9°F | Ht 70.0 in | Wt 156.2 lb

## 2020-04-03 DIAGNOSIS — I10 Essential (primary) hypertension: Secondary | ICD-10-CM

## 2020-04-03 DIAGNOSIS — F1121 Opioid dependence, in remission: Secondary | ICD-10-CM

## 2020-04-03 MED ORDER — BUPRENORPHINE HCL-NALOXONE HCL 8-2 MG SL FILM: 1.0000 | ORAL_FILM | Freq: Two times a day (BID) | SUBLINGUAL | 0 refills | Status: DC

## 2020-04-03 NOTE — Progress Notes (Signed)
   04/03/2020  Vincent Clarke presents for follow up of opioid use disorder I have reviewed the prior induction visit, follow up visits, and telephone encounters relevant to opiate use disorder (OUD) treatment.   Current daily dose: Suboxone 8-2mg  bid   Date of Induction: 05/14/2018  Current follow up interval, in weeks: 4 weeks   The patient has been adherent with the buprenorphine for OUD contract.   Last UDS Result: 12/06/2019  HPI: [name] is a 54 year old male presenting for follow up of opioid use disorder.  He still reports some intermittent relapses despite steady Suboxone use.  Suboxone is helping control cravings.  He is having some financial difficulties which adds to stress and leads to relapse.  He notes recently going to court to get an extension to pay for his rent.  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  Exam:   Vitals:   04/03/20 0920  BP: (!) 116/59  Pulse: 65  Temp: 97.9 F (36.6 C)  TempSrc: Oral  SpO2: 98%  Weight: 156 lb 3.2 oz (70.9 kg)  Height: 5\' 10"  (1.778 m)    Physical Exam  Gen: NAD, good eye contact Heart: RRR Pulm: Normal Effort Psych: appropriate affect   Assessment/Plan:  See Problem Based Charting in the Encounters Tab   , DO  04/03/2020  1:30 PM

## 2020-04-03 NOTE — Assessment & Plan Note (Signed)
We will check urine tox screen this visit notes did have a relapse about 4 days ago. I do expect to see Suboxone present. We will plan to continue Suboxone 8-2 2 films daily.

## 2020-04-03 NOTE — Assessment & Plan Note (Signed)
Blood pressure improved now at goal with lisinopril hydrochlorothiazide 20-25 mg daily

## 2020-04-06 MED FILL — SUBOXONE 8 MG-2 MG SL FILM: 8-2 | 30 days supply | Qty: 60 | Fill #0

## 2020-04-11 LAB — TOXASSURE SELECT,+ANTIDEPR,UR

## 2020-04-12 MED FILL — PANTOPRAZOLE SOD DR 40 MG T: 40 | 30 days supply | Qty: 60 | Fill #1

## 2020-04-19 MED FILL — LISINOPRIL-HCTZ 20-25 MG TA: 20-25 | 90 days supply | Qty: 90 | Fill #2

## 2020-04-24 DIAGNOSIS — K222 Esophageal obstruction: Secondary | ICD-10-CM | POA: Diagnosis not present

## 2020-04-24 DIAGNOSIS — R1310 Dysphagia, unspecified: Secondary | ICD-10-CM | POA: Diagnosis not present

## 2020-04-26 DIAGNOSIS — R1310 Dysphagia, unspecified: Secondary | ICD-10-CM | POA: Diagnosis not present

## 2020-04-26 DIAGNOSIS — K222 Esophageal obstruction: Secondary | ICD-10-CM | POA: Diagnosis not present

## 2020-05-07 ENCOUNTER — Other Ambulatory Visit: Payer: Self-pay | Admitting: Internal Medicine

## 2020-05-07 ENCOUNTER — Other Ambulatory Visit: Payer: Self-pay

## 2020-05-07 DIAGNOSIS — F1121 Opioid dependence, in remission: Secondary | ICD-10-CM

## 2020-05-07 MED ORDER — BUPRENORPHINE HCL-NALOXONE HCL 8-2 MG SL FILM: 1.0000 | ORAL_FILM | Freq: Two times a day (BID) | SUBLINGUAL | 0 refills | Status: DC

## 2020-05-07 MED FILL — SUBOXONE 8 MG-2 MG SL FILM: 8-2 | 15 days supply | Qty: 30 | Fill #0

## 2020-05-07 NOTE — Telephone Encounter (Signed)
Needs follow up appointment in OUD in 1-2 weeks.  Will give a 2 week supply

## 2020-05-07 NOTE — Telephone Encounter (Signed)
Pt is requesting hisBuprenorphine HCl-Naloxone HCl 8-2 MG FILM sent to   [facility] - [place], [place] - [address] [facility] Phone:  [phone]  Fax:  [phone]

## 2020-05-09 MED FILL — PANTOPRAZOLE SOD DR 40 MG T: 40 | 30 days supply | Qty: 60 | Fill #2

## 2020-05-09 MED FILL — LISINOPRIL-HCTZ 20-25 MG TA: 20-25 | 90 days supply | Qty: 90 | Fill #2

## 2020-05-09 MED FILL — traZODone HCL 100 MG TABS: 100 | 30 days supply | Qty: 30 | Fill #2

## 2020-05-21 ENCOUNTER — Telehealth: Payer: Self-pay

## 2020-05-21 NOTE — Telephone Encounter (Signed)
RTC, patient states he has an OUD appt tomorrow, but his car broke down and he can't get here.  He is requesting a Telehealth appt.  Per last OUD visit notes, next appt can be telehealth.   Appt changed to telehealth. SChaplin, RN,BSN

## 2020-05-21 NOTE — Telephone Encounter (Signed)
Requesting to speak with a nurse about something. Please call pt back.  °

## 2020-05-22 ENCOUNTER — Ambulatory Visit (INDEPENDENT_AMBULATORY_CARE_PROVIDER_SITE_OTHER): Payer: Medicaid Other | Admitting: Internal Medicine

## 2020-05-22 ENCOUNTER — Other Ambulatory Visit: Payer: Self-pay

## 2020-05-22 ENCOUNTER — Other Ambulatory Visit: Payer: Self-pay | Admitting: Internal Medicine

## 2020-05-22 DIAGNOSIS — F1121 Opioid dependence, in remission: Secondary | ICD-10-CM | POA: Diagnosis not present

## 2020-05-22 MED ORDER — BUPRENORPHINE HCL-NALOXONE HCL 8-2 MG SL FILM: 1.0000 | ORAL_FILM | Freq: Two times a day (BID) | SUBLINGUAL | 0 refills | Status: DC

## 2020-05-22 MED FILL — SUBOXONE 8 MG-2 MG SL FILM: 8-2 | 15 days supply | Qty: 30 | Fill #0

## 2020-05-22 NOTE — Progress Notes (Addendum)
I connected with  Vincent Clarke on 05/22/20 by telephone and verified that I am speaking with the correct person using two identifiers.   I discussed the limitations of evaluation and management by telemedicine. The patient expressed understanding and agreed to proceed.      05/25/2020  Vincent Clarke presents for follow up of opioid use disorder I have reviewed the prior induction visit, follow up visits, and telephone encounters relevant to opiate use disorder (OUD) treatment.   Current daily dose: 8-2 mg films BID  Date of Induction: 05/14/2018  Current follow up interval, in weeks: 2 (then back to 1 month)  The patient has been adherent with the buprenorphine for OUD contract.   Last UDS Result: 04/03/2020  HPI: This is a 55 y.o. old male living with OUD who presents for a follow up appointment for OUD. He admits to adherence to his medication as prescribed. He admits to cravings and highlights the following triggers: Stressful situations such as his car breaking down. He denies recent relapses since his last appointment.    Exam:   There were no vitals filed for this visit. Physical Exam -no physical exam performed today as this was a telephone visit.   Assessment/Plan:  See Problem Based Charting in the Encounters Tab  I spent exactly 8 minutes with this patient. He was at home and I was located in [facility].   Chari Manning, D.O.  Internal Medicine Resident, PGY-2 [facility]  Pager: [phone] 6:50 AM, 05/25/2020

## 2020-05-25 ENCOUNTER — Encounter: Payer: Self-pay | Admitting: Internal Medicine

## 2020-05-25 DIAGNOSIS — K222 Esophageal obstruction: Secondary | ICD-10-CM | POA: Diagnosis not present

## 2020-05-25 DIAGNOSIS — R1310 Dysphagia, unspecified: Secondary | ICD-10-CM | POA: Diagnosis not present

## 2020-05-25 NOTE — Addendum Note (Signed)
Addended by: Hally Colella N on: 05/25/2020 07:36 AM   Modules accepted: Level of Service  

## 2020-05-25 NOTE — Assessment & Plan Note (Addendum)
Patient presents for a telephone follow-up for opioid use disorder.  His last appointment was on 11/23.  Patient has had problems with relapses in the last several months with his last U tox on 11/23 positive for fentanyl and prior to that on 07/27 showing methamphetamines and on 05/11 showing morphine and fentanyl.  Patient states that he is doing well on his current dose of Suboxone which is two films twice daily.  He states that he was unable to attend his appointment today due to car troubles.  He denies any recent relapses since his last appointment, but states that he has noticed increased cravings particularly with stressful situations.  He highlights his recent car troubles as an example.  He states that he has tried to keep himself occupied to avoid further relapses and believes this is working for him.  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  Patient states that his car will be fixed in the near future and will be available for a follow-up appointment in 2 weeks in the office.  We can recheck a U tox at that time and then put him back out to 1 month appointments.  Plan:  1. Refill Suboxone 8-2 mg films twice daily, weekly supply 2.  PDMP reviewed and is appropriate 3.  2-week follow-up in the clinic U tox at that time

## 2020-05-30 NOTE — Progress Notes (Signed)
Internal Medicine Clinic Attending  Case discussed with Dr. Coe  At the time of the visit.  We reviewed the resident's history and exam and pertinent patient test results.  I agree with the assessment, diagnosis, and plan of care documented in the resident's note.  

## 2020-05-30 NOTE — Addendum Note (Signed)
Addended by: Burnell Blanks on: 05/30/2020 11:48 AM   Modules accepted: Level of Service

## 2020-06-04 ENCOUNTER — Other Ambulatory Visit: Payer: Self-pay | Admitting: Gastroenterology

## 2020-06-04 MED FILL — PANTOPRAZOLE SOD DR 40 MG T: 40 | 30 days supply | Qty: 60 | Fill #0

## 2020-06-04 MED FILL — traZODone HCL 100 MG TABS: 100 | 30 days supply | Qty: 30 | Fill #2

## 2020-06-12 ENCOUNTER — Other Ambulatory Visit: Payer: Self-pay

## 2020-06-12 ENCOUNTER — Other Ambulatory Visit: Payer: Self-pay | Admitting: Internal Medicine

## 2020-06-12 DIAGNOSIS — F1121 Opioid dependence, in remission: Secondary | ICD-10-CM

## 2020-06-12 MED ORDER — BUPRENORPHINE HCL-NALOXONE HCL 8-2 MG SL FILM
1.0000 | ORAL_FILM | Freq: Two times a day (BID) | SUBLINGUAL | 0 refills | Status: DC
Start: 2020-06-12 — End: 2020-06-26

## 2020-06-12 MED FILL — SUBOXONE 8 MG-2 MG SL FILM: 8-2 | 15 days supply | Qty: 30 | Fill #0

## 2020-06-12 NOTE — Telephone Encounter (Signed)
Will provide refill, please schedule within next 2 weeks

## 2020-06-12 NOTE — Telephone Encounter (Signed)
Buprenorphine HCl-Naloxone HCl 8-2 MG FILM, refill request @  [facility] - [place], [place] - [address] [facility]. Phone:  [phone]  Fax:  [phone]

## 2020-06-26 ENCOUNTER — Ambulatory Visit (INDEPENDENT_AMBULATORY_CARE_PROVIDER_SITE_OTHER): Payer: Medicaid Other | Admitting: Internal Medicine

## 2020-06-26 ENCOUNTER — Other Ambulatory Visit: Payer: Self-pay | Admitting: Student in an Organized Health Care Education/Training Program

## 2020-06-26 VITALS — BP 134/76 | HR 77 | Temp 98.3°F | Wt 160.2 lb

## 2020-06-26 DIAGNOSIS — F1121 Opioid dependence, in remission: Secondary | ICD-10-CM | POA: Diagnosis not present

## 2020-06-26 MED ORDER — BUPRENORPHINE HCL-NALOXONE HCL 8-2 MG SL FILM: 1.0000 | ORAL_FILM | Freq: Two times a day (BID) | SUBLINGUAL | 0 refills | Status: DC

## 2020-06-26 MED FILL — SUBOXONE 8 MG-2 MG SL FILM: 8-2 | 30 days supply | Qty: 60 | Fill #0

## 2020-06-26 NOTE — Patient Instructions (Signed)

## 2020-06-26 NOTE — Assessment & Plan Note (Addendum)
A/P: Opioid use disorder, moderate, in early remission (HCC). Patient had one relapse since last appoiment. His cravings are controlled on 8-2 mg films BID. Plan for follow up via telephone in one month.  - ToxAssure Select,+Antidepr,UR - Buprenorphine HCl-Naloxone HCl 8-2 MG FILM; Place 1 Film under the tongue 2 (two) times daily.  Dispense: 60 each; Refill: 0

## 2020-06-26 NOTE — Progress Notes (Signed)
   06/26/2020  [name] presents for follow up of opioid use disorder I have reviewed the prior induction visit, follow up visits, and telephone encounters relevant to opiate use disorder (OUD) treatment.   Current daily dose: Buprenorphine-naloxone 8-2 film BID  Date of Induction: 05/14/2018  Current follow up interval, in weeks: 1 month     The patient has been adherent with the buprenorphine for OUD contract.   Last UDS Result: 11/232021 inappropriate   HPI: This is a 4 old with a history of OUD who presented for follow up of his OUD.  He reports he has been adherent to Suboxone therapy which is controlling cravings well. He has had one episode of relapse. He had a fall at work, which strained his back and caused his knee to hurt. He reports snorting heroin for pain relief. He is doing well on Suboxone therapy and has shown progression from his previous daily use of heroin.    Exam:   Vitals:   06/26/20 0855  BP: 134/76  Pulse: 77  Temp: 98.3 F (36.8 C)  TempSrc: Oral  Weight: 160 lb 3.2 oz (72.7 kg)    General: NAD, nl appearance  Cardiovascular: Normal rate, regular rhythm.  No murmurs, rubs, or gallops Pulmonary : Effort normal, breath sounds normal. No wheezes, rales, or rhonchi Abdominal: soft, nontender,  bowel sounds present     Assessment/Plan:  See Problem Based Charting in the Encounters Tab     Albertha Ghee, MD  06/26/2020  9:15 AM

## 2020-06-26 NOTE — Progress Notes (Signed)
Internal Medicine Clinic Attending  I saw and evaluated the patient.  I personally confirmed the key portions of the history and exam documented by Dr. Steen and I reviewed pertinent patient test results.  The assessment, diagnosis, and plan were formulated together and I agree with the documentation in the resident's note.     

## 2020-06-28 DIAGNOSIS — K222 Esophageal obstruction: Secondary | ICD-10-CM | POA: Diagnosis not present

## 2020-06-28 DIAGNOSIS — R1310 Dysphagia, unspecified: Secondary | ICD-10-CM | POA: Diagnosis not present

## 2020-07-03 LAB — TOXASSURE SELECT,+ANTIDEPR,UR

## 2020-07-18 MED FILL — PANTOPRAZOLE SOD DR 40 MG T: 40 | 30 days supply | Qty: 60 | Fill #1

## 2020-07-18 MED FILL — traZODone HCL 100 MG TABS: 100 | 30 days supply | Qty: 30 | Fill #3

## 2020-07-24 ENCOUNTER — Other Ambulatory Visit: Payer: Self-pay

## 2020-07-24 ENCOUNTER — Ambulatory Visit (INDEPENDENT_AMBULATORY_CARE_PROVIDER_SITE_OTHER): Payer: Medicaid Other | Admitting: Internal Medicine

## 2020-07-24 ENCOUNTER — Encounter: Payer: Self-pay | Admitting: Internal Medicine

## 2020-07-24 ENCOUNTER — Other Ambulatory Visit: Payer: Self-pay | Admitting: Internal Medicine

## 2020-07-24 DIAGNOSIS — F411 Generalized anxiety disorder: Secondary | ICD-10-CM | POA: Insufficient documentation

## 2020-07-24 DIAGNOSIS — K222 Esophageal obstruction: Secondary | ICD-10-CM | POA: Diagnosis not present

## 2020-07-24 DIAGNOSIS — F1121 Opioid dependence, in remission: Secondary | ICD-10-CM | POA: Diagnosis not present

## 2020-07-24 DIAGNOSIS — F1120 Opioid dependence, uncomplicated: Secondary | ICD-10-CM

## 2020-07-24 DIAGNOSIS — R1310 Dysphagia, unspecified: Secondary | ICD-10-CM | POA: Diagnosis not present

## 2020-07-24 MED ORDER — BUPRENORPHINE HCL-NALOXONE HCL 8-2 MG SL FILM
1.0000 | ORAL_FILM | Freq: Two times a day (BID) | SUBLINGUAL | 0 refills | Status: DC
Start: 2020-07-24 — End: 2020-07-24

## 2020-07-24 MED ORDER — CITALOPRAM HYDROBROMIDE 10 MG PO TABS: 10.0000 mg | ORAL_TABLET | Freq: Every day | ORAL | 1 refills | Status: DC

## 2020-07-24 NOTE — Assessment & Plan Note (Signed)
Patient here for telehealth follow up of his OUD. Unfortunately he continues to have issues with relapse. Looking back through his chart, he has not had an appropriate Utox since March of 2020, two years ago. We discussed the results of his last Utox being positive for morphine and fentanyl. He denied relapse at the time. But today he tells me that he normally relapses about once a week. He tries to keep busy to occupy himself because when he is not working he tends to go back to using. He works 6 days a week and tends to relapse on the day he is not working. He cites multiple stressors in his life as triggers and feels like he has been struggling with anxiety. Despite this, he still feels like suboxone is helping significantly. Before suboxone, he was using $100 worth of heroin daily. It seems like suboxone has been a great risk reduction tool for him even though he has been unable to achieve remission. Plan to continue current treatment for now. We are addressing his anxiety disorder today with initiation of an SSRI. I am hopeful this may help as well. Database reviewed.  -- Continue suboxone 8-2 mg BID, 1 month supply sent to pharmacy -- Follow up in person in 1 month for repeat Utox

## 2020-07-24 NOTE — Assessment & Plan Note (Signed)
Patient reports symptoms of GAD (restlessness, feeling anxious, constant worry, and difficulty sleeping). He has been taking trazodone for his sleep which is effective, however anxiety during the day seems to be a major trigger for his frequent relapses. We discussed SSRI therapy. Counseled on possible side effects including GI upset and sexual dysfunction. He is open to treatment. He said he is willing to try "anything that might help".  -- Start citalopram 10 mg daily -- Follow up 1 month

## 2020-07-24 NOTE — Progress Notes (Signed)
    This is a telephone encounter between [employer] and [employer] on 07/24/2020 for follow up of OUD. The visit was conducted with the patient located at home and Reymundo Poll at [facility]. The patient's identity was confirmed using their DOB and current address. The patient has consented to being evaluated through a telephone encounter and understands the associated risks (an examination cannot be done and the patient may need to come in for an appointment) / benefits (allows the patient to remain at home, decreasing exposure to coronavirus). I personally spent 12 minutes on medical discussion.    Vincent Clarke presents for follow up of opioid use disorder I have reviewed the prior induction visit, follow up visits, and telephone encounters relevant to opiate use disorder (OUD) treatment.   Current daily dose: Suboxone 8-2 mg film BID  Date of Induction: 05/14/2018  Current follow up interval, in weeks: 4 weeks  The patient has not been adherent with the buprenorphine for OUD contract.   Last UDS Result: Appropriate for low levels of buprenorphine and metabolite, inappropriate for morphine and fentanyl.   HPI: For the details of today's visit, please refer to the assessment and plan.   Review of Systems  Constitutional: Negative for chills and fever.  Gastrointestinal: Negative for abdominal pain and diarrhea.     Assessment & Plan:   See Encounters Tab for problem based charting.

## 2020-08-13 ENCOUNTER — Other Ambulatory Visit (HOSPITAL_COMMUNITY): Payer: Self-pay

## 2020-08-13 MED FILL — Lisinopril & Hydrochlorothiazide Tab 20-25 MG: ORAL | 90 days supply | Qty: 90 | Fill #0 | Status: AC

## 2020-08-13 MED FILL — Pantoprazole Sodium EC Tab 40 MG (Base Equiv): ORAL | 30 days supply | Qty: 60 | Fill #0 | Status: AC

## 2020-08-16 ENCOUNTER — Other Ambulatory Visit (HOSPITAL_COMMUNITY): Payer: Self-pay

## 2020-08-16 ENCOUNTER — Other Ambulatory Visit: Payer: Self-pay | Admitting: Internal Medicine

## 2020-08-16 DIAGNOSIS — F419 Anxiety disorder, unspecified: Secondary | ICD-10-CM

## 2020-08-16 DIAGNOSIS — F5105 Insomnia due to other mental disorder: Secondary | ICD-10-CM

## 2020-08-17 ENCOUNTER — Other Ambulatory Visit (HOSPITAL_COMMUNITY): Payer: Self-pay

## 2020-08-17 MED ORDER — TRAZODONE HCL 100 MG PO TABS
50.0000 mg | ORAL_TABLET | Freq: Every day | ORAL | 1 refills | Status: DC
  Filled 2020-08-17: qty 90, 90d supply, fill #0
  Filled 2021-03-05: qty 90, 90d supply, fill #1

## 2020-08-21 ENCOUNTER — Other Ambulatory Visit (HOSPITAL_COMMUNITY): Payer: Self-pay

## 2020-08-21 ENCOUNTER — Other Ambulatory Visit: Payer: Self-pay

## 2020-08-21 DIAGNOSIS — F1121 Opioid dependence, in remission: Secondary | ICD-10-CM

## 2020-08-21 MED ORDER — SUBOXONE 8-2 MG SL FILM
1.0000 | ORAL_FILM | Freq: Two times a day (BID) | SUBLINGUAL | 0 refills | Status: DC
  Filled 2020-08-21: qty 60, fill #0
  Filled 2020-08-27: qty 60, 30d supply, fill #0

## 2020-08-21 NOTE — Telephone Encounter (Signed)
SUBOXONE 8-2 MG FILM, REFILL REQUEST @  [facility] Phone:  [phone]  Fax:  [phone]

## 2020-08-21 NOTE — Telephone Encounter (Signed)
Next appt scheduled 09/11/20 with OUD clinic.

## 2020-08-21 NOTE — Telephone Encounter (Signed)
Reviewed PDMP - approved 1 month refill of suboxone. He is over due for OUD follow up but scheduled for 09/11/20.

## 2020-08-22 ENCOUNTER — Other Ambulatory Visit (HOSPITAL_COMMUNITY): Payer: Self-pay

## 2020-08-23 ENCOUNTER — Other Ambulatory Visit (HOSPITAL_BASED_OUTPATIENT_CLINIC_OR_DEPARTMENT_OTHER): Payer: Self-pay

## 2020-08-27 ENCOUNTER — Telehealth: Payer: Self-pay

## 2020-08-27 ENCOUNTER — Other Ambulatory Visit (HOSPITAL_COMMUNITY): Payer: Self-pay

## 2020-08-27 NOTE — Telephone Encounter (Signed)
Rx was refilled 0n 08/21/20; pt was called, stated he will call the pharmacy.

## 2020-08-27 NOTE — Telephone Encounter (Signed)
Need refill on   SUBOXONE 8-2 MG FILM ;pt contact [phone]   [facility]

## 2020-09-04 ENCOUNTER — Other Ambulatory Visit: Payer: Self-pay | Admitting: Gastroenterology

## 2020-09-04 ENCOUNTER — Other Ambulatory Visit (HOSPITAL_COMMUNITY): Payer: Self-pay

## 2020-09-04 MED ORDER — PANTOPRAZOLE SODIUM 40 MG PO TBEC
40.0000 mg | DELAYED_RELEASE_TABLET | Freq: Two times a day (BID) | ORAL | 11 refills | Status: DC
  Filled 2020-09-04: qty 60, 30d supply, fill #0
  Filled 2020-10-03: qty 60, 30d supply, fill #1
  Filled 2020-11-02: qty 60, 30d supply, fill #2
  Filled 2020-11-23: qty 60, 30d supply, fill #3
  Filled 2020-12-20: qty 60, 30d supply, fill #4
  Filled 2021-01-15: qty 60, 30d supply, fill #5
  Filled 2021-02-08: qty 60, 30d supply, fill #6
  Filled 2021-03-05: qty 60, 30d supply, fill #7
  Filled 2021-04-01: qty 60, 30d supply, fill #8
  Filled 2021-04-24: qty 60, 30d supply, fill #9
  Filled 2021-05-16 – 2021-05-20 (×2): qty 60, 30d supply, fill #10
  Filled 2021-06-11: qty 60, 30d supply, fill #11

## 2020-09-05 ENCOUNTER — Other Ambulatory Visit (HOSPITAL_COMMUNITY): Payer: Self-pay

## 2020-09-11 ENCOUNTER — Ambulatory Visit (INDEPENDENT_AMBULATORY_CARE_PROVIDER_SITE_OTHER): Payer: Medicaid Other | Admitting: Student in an Organized Health Care Education/Training Program

## 2020-09-11 ENCOUNTER — Other Ambulatory Visit (HOSPITAL_COMMUNITY): Payer: Self-pay

## 2020-09-11 ENCOUNTER — Other Ambulatory Visit: Payer: Self-pay

## 2020-09-11 VITALS — BP 111/62 | HR 71 | Temp 98.5°F | Wt 163.4 lb

## 2020-09-11 DIAGNOSIS — F411 Generalized anxiety disorder: Secondary | ICD-10-CM | POA: Diagnosis not present

## 2020-09-11 DIAGNOSIS — F1121 Opioid dependence, in remission: Secondary | ICD-10-CM

## 2020-09-11 DIAGNOSIS — F1120 Opioid dependence, uncomplicated: Secondary | ICD-10-CM

## 2020-09-11 MED ORDER — SUBOXONE 8-2 MG SL FILM
1.0000 | ORAL_FILM | Freq: Two times a day (BID) | SUBLINGUAL | 0 refills | Status: DC
Start: 2020-09-11 — End: 2020-10-23
  Filled 2020-09-11 – 2020-09-25 (×2): qty 60, 30d supply, fill #0

## 2020-09-11 NOTE — Progress Notes (Signed)
   Assessment and Plan:  See Encounters tab for problem-based medical decision making.   __________________________________________________________  HPI:   55 year old person here for follow up of opioid use disorder. He reports doing well recently, only about one relapse per week, inhales heroin, does not inject. Good adherence to suboxone schedule twice daily. Has good access to the strips through medicaid. 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 Says this is safe for him, no drugs around, he keeps his suboxone locked in his truck. Swallowing is going well, no recurrent dysphagia, putting on weight, normal exertional capacity and function.   __________________________________________________________  Problem List: Patient Active Problem List   Diagnosis Date Noted  . Opioid use disorder, moderate, on maintenance therapy (HCC)     Priority: High  . Generalized anxiety disorder 07/24/2020  . History of small bowel obstruction 09/03/2019  . Abnormal endoscopy of upper gastrointestinal tract 09/03/2019  . 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 Hiatal hernia 07/06/2019  . Barrett's esophagus 07/06/2019  . Asthma   . 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 SBO (small bowel obstruction) (HCC) 10/12/2018  . S/P percutaneous endoscopic gastrostomy (PEG) tube placement 11/2018 07/06/2018  . Esophageal stricture s/p diliations/stenting   . Esophagitis, erosive 06/05/2018  . Peripheral vascular disease (HCC) 12/2014  . HTN (hypertension) 03/21/2014  . GERD (gastroesophageal reflux disease) 03/21/2014  . Tobacco use disorder 03/21/2014    Medications: Reconciled today in Epic __________________________________________________________  Physical Exam:  Vital Signs: Vitals:   09/11/20 0915  BP: 111/62  Pulse: 71  Temp: 98.5 F (36.9 C)   TempSrc: Oral  SpO2: 97%  Weight: 163 lb 6.4 oz (74.1 kg)    Gen: Well appearing, NAD Neck: No cervical LAD, No thyromegaly or nodules. CV: RRR, no murmurs Pulm: Normal effort, CTA throughout, no wheezing Ext: Warm, no edema

## 2020-09-11 NOTE — Assessment & Plan Note (Signed)
Patient is looking really good today. He has good compliance with suboxone, still having a relapse about once a week related to impulsivity and when he doesn't have work to do. We will check a urine tox today to ensure he is taking the suboxone. Otherwise it seems reasonable to continue this risk reduction model. He has not injected drugs. He is working as a Editor, commissioning. His weight has returned to normal. Database was appropriate. He is keeping his suboxone is a safe place. I prescribed another 4 weeks, follow up with [place] in a month.

## 2020-09-11 NOTE — Assessment & Plan Note (Signed)
Stable to improved. Patient unable to tolerate citalopram due to sexual dysfunction. Ok to continue without medications for now, his symptoms are mild and not function-limiting at this time.

## 2020-09-19 LAB — TOXASSURE SELECT,+ANTIDEPR,UR

## 2020-09-24 ENCOUNTER — Other Ambulatory Visit (HOSPITAL_COMMUNITY): Payer: Self-pay

## 2020-09-25 ENCOUNTER — Other Ambulatory Visit (HOSPITAL_COMMUNITY): Payer: Self-pay

## 2020-09-25 DIAGNOSIS — R1310 Dysphagia, unspecified: Secondary | ICD-10-CM | POA: Diagnosis not present

## 2020-09-25 DIAGNOSIS — K222 Esophageal obstruction: Secondary | ICD-10-CM | POA: Diagnosis not present

## 2020-10-03 ENCOUNTER — Other Ambulatory Visit (HOSPITAL_COMMUNITY): Payer: Self-pay

## 2020-10-07 ENCOUNTER — Encounter (HOSPITAL_COMMUNITY): Payer: Self-pay | Admitting: *Deleted

## 2020-10-07 ENCOUNTER — Inpatient Hospital Stay (HOSPITAL_COMMUNITY)
Admission: EM | Admit: 2020-10-07 | Discharge: 2020-10-10 | DRG: 392 | Disposition: A | Discharge status: Home / Self Care | Payer: Medicaid Other | Attending: Internal Medicine | Admitting: Internal Medicine

## 2020-10-07 ENCOUNTER — Other Ambulatory Visit: Payer: Self-pay

## 2020-10-07 DIAGNOSIS — F1120 Opioid dependence, uncomplicated: Secondary | ICD-10-CM

## 2020-10-07 DIAGNOSIS — K222 Esophageal obstruction: Principal | ICD-10-CM

## 2020-10-07 DIAGNOSIS — R1310 Dysphagia, unspecified: Secondary | ICD-10-CM

## 2020-10-07 DIAGNOSIS — I10 Essential (primary) hypertension: Secondary | ICD-10-CM | POA: Diagnosis present

## 2020-10-07 DIAGNOSIS — Z79899 Other long term (current) drug therapy: Secondary | ICD-10-CM

## 2020-10-07 DIAGNOSIS — R1314 Dysphagia, pharyngoesophageal phase: Secondary | ICD-10-CM | POA: Diagnosis present

## 2020-10-07 DIAGNOSIS — Z9049 Acquired absence of other specified parts of digestive tract: Secondary | ICD-10-CM

## 2020-10-07 DIAGNOSIS — E874 Mixed disorder of acid-base balance: Secondary | ICD-10-CM | POA: Diagnosis present

## 2020-10-07 DIAGNOSIS — I959 Hypotension, unspecified: Secondary | ICD-10-CM | POA: Diagnosis not present

## 2020-10-07 DIAGNOSIS — T18128A Food in esophagus causing other injury, initial encounter: Secondary | ICD-10-CM

## 2020-10-07 DIAGNOSIS — Z20822 Contact with and (suspected) exposure to covid-19: Secondary | ICD-10-CM | POA: Diagnosis present

## 2020-10-07 DIAGNOSIS — E0590 Thyrotoxicosis, unspecified without thyrotoxic crisis or storm: Secondary | ICD-10-CM | POA: Diagnosis present

## 2020-10-07 DIAGNOSIS — I739 Peripheral vascular disease, unspecified: Secondary | ICD-10-CM | POA: Diagnosis present

## 2020-10-07 DIAGNOSIS — F411 Generalized anxiety disorder: Secondary | ICD-10-CM | POA: Diagnosis present

## 2020-10-07 DIAGNOSIS — N179 Acute kidney failure, unspecified: Secondary | ICD-10-CM | POA: Diagnosis present

## 2020-10-07 DIAGNOSIS — K2270 Barrett's esophagus without dysplasia: Secondary | ICD-10-CM | POA: Diagnosis present

## 2020-10-07 DIAGNOSIS — K2100 Gastro-esophageal reflux disease with esophagitis, without bleeding: Secondary | ICD-10-CM | POA: Diagnosis present

## 2020-10-07 DIAGNOSIS — F17210 Nicotine dependence, cigarettes, uncomplicated: Secondary | ICD-10-CM | POA: Diagnosis present

## 2020-10-07 DIAGNOSIS — F1123 Opioid dependence with withdrawal: Secondary | ICD-10-CM | POA: Diagnosis present

## 2020-10-07 DIAGNOSIS — E876 Hypokalemia: Secondary | ICD-10-CM | POA: Diagnosis present

## 2020-10-07 DIAGNOSIS — F17200 Nicotine dependence, unspecified, uncomplicated: Secondary | ICD-10-CM | POA: Diagnosis present

## 2020-10-07 LAB — COMPREHENSIVE METABOLIC PANEL
ALT: 29 U/L (ref 0–44)
AST: 35 U/L (ref 15–41)
Albumin: 4 g/dL (ref 3.5–5.0)
Alkaline Phosphatase: 94 U/L (ref 38–126)
Anion gap: 10 (ref 5–15)
BUN: 15 mg/dL (ref 6–20)
CO2: 25 mmol/L (ref 22–32)
Calcium: 9.2 mg/dL (ref 8.9–10.3)
Chloride: 101 mmol/L (ref 98–111)
Creatinine, Ser: 1.26 mg/dL — ABNORMAL HIGH (ref 0.61–1.24)
GFR, Estimated: >60 mL/min (ref >60)
Glucose, Bld: 98 mg/dL (ref 70–99)
Potassium: 3.1 mmol/L — ABNORMAL LOW (ref 3.5–5.1)
Sodium: 136 mmol/L (ref 135–145)
Total Bilirubin: 0.7 mg/dL (ref 0.3–1.2)
Total Protein: 7.5 g/dL (ref 6.5–8.1)

## 2020-10-07 LAB — CBC WITH DIFFERENTIAL/PLATELET
Abs Immature Granulocytes: 0.01 10*3/uL (ref 0.00–0.07)
Basophils Absolute: 0.1 10*3/uL (ref 0.0–0.1)
Basophils Relative: 1 %
Eosinophils Absolute: 0.2 10*3/uL (ref 0.0–0.5)
Eosinophils Relative: 2 %
HCT: 41.8 % (ref 39.0–52.0)
Hemoglobin: 14.5 g/dL (ref 13.0–17.0)
Immature Granulocytes: 0 %
Lymphocytes Relative: 36 %
Lymphs Abs: 3 10*3/uL (ref 0.7–4.0)
MCH: 31.3 pg (ref 26.0–34.0)
MCHC: 34.7 g/dL (ref 30.0–36.0)
MCV: 90.3 fL (ref 80.0–100.0)
Monocytes Absolute: 1 10*3/uL (ref 0.1–1.0)
Monocytes Relative: 11 %
Neutro Abs: 4.3 10*3/uL (ref 1.7–7.7)
Neutrophils Relative %: 50 %
Platelets: 355 10*3/uL (ref 150–400)
RBC: 4.63 MIL/uL (ref 4.22–5.81)
RDW: 12.6 % (ref 11.5–15.5)
WBC: 8.5 10*3/uL (ref 4.0–10.5)
nRBC: 0 % (ref 0.0–0.2)

## 2020-10-07 LAB — LIPASE, BLOOD: Lipase: 26 U/L (ref 11–51)

## 2020-10-07 NOTE — ED Provider Notes (Signed)
Emergency Medicine Provider Triage Evaluation Note  Vincent Clarke , a 55 y.o. male  was evaluated in triage.  Pt complains of vomiting.  Patient with a history of esophageal strictures, multiple dilations done in the past.  Patient has had to have a feeding tube.  States over the last 3 days, he has not been able to hold down any liquids or solids.  As soon as he tries to drink or eat something, he vomits it back up.  He states this feels exactly like his strictures in the past.  Denies any abdominal pain, last bowel movement was this morning's, small, but normal. Review of Systems  Positive: As above Negative: As above  Physical Exam  There were no vitals taken for this visit. Gen:   Awake, no distress   Resp:  Normal effort  MSK:   Moves extremities without diffiulty  Other:  Abdomen soft, nontender  Medical Decision Making  Medically screening exam initiated at 8:42 PM.  Appropriate orders placed.  Vincent Clarke was informed that the remainder of the evaluation will be completed by another provider, this initial triage assessment does not replace that evaluation, and the importance of remaining in the ED until their evaluation is complete.     Vincent Clarke 10/07/20 2044    Vincent Files, MD 10/08/20 1003

## 2020-10-07 NOTE — ED Triage Notes (Signed)
The pt arrived by ems he feels like his throat is closing on him since Thursday.  Hx of the same

## 2020-10-08 ENCOUNTER — Encounter (HOSPITAL_COMMUNITY)
Admission: EM | Disposition: A | Discharge status: Home / Self Care | Payer: Self-pay | Source: Home / Self Care | Attending: Internal Medicine

## 2020-10-08 ENCOUNTER — Inpatient Hospital Stay (HOSPITAL_COMMUNITY): Payer: Medicaid Other | Admitting: Certified Registered Nurse Anesthetist

## 2020-10-08 ENCOUNTER — Encounter (HOSPITAL_COMMUNITY): Payer: Self-pay | Admitting: Internal Medicine

## 2020-10-08 DIAGNOSIS — K222 Esophageal obstruction: Secondary | ICD-10-CM | POA: Diagnosis not present

## 2020-10-08 DIAGNOSIS — Z79899 Other long term (current) drug therapy: Secondary | ICD-10-CM | POA: Diagnosis not present

## 2020-10-08 DIAGNOSIS — F411 Generalized anxiety disorder: Secondary | ICD-10-CM | POA: Diagnosis present

## 2020-10-08 DIAGNOSIS — T18128A Food in esophagus causing other injury, initial encounter: Secondary | ICD-10-CM

## 2020-10-08 DIAGNOSIS — K2100 Gastro-esophageal reflux disease with esophagitis, without bleeding: Secondary | ICD-10-CM | POA: Diagnosis present

## 2020-10-08 DIAGNOSIS — Z9049 Acquired absence of other specified parts of digestive tract: Secondary | ICD-10-CM | POA: Diagnosis not present

## 2020-10-08 DIAGNOSIS — F1123 Opioid dependence with withdrawal: Secondary | ICD-10-CM | POA: Diagnosis present

## 2020-10-08 DIAGNOSIS — Z20822 Contact with and (suspected) exposure to covid-19: Secondary | ICD-10-CM | POA: Diagnosis present

## 2020-10-08 DIAGNOSIS — R1310 Dysphagia, unspecified: Secondary | ICD-10-CM

## 2020-10-08 DIAGNOSIS — I959 Hypotension, unspecified: Secondary | ICD-10-CM | POA: Diagnosis not present

## 2020-10-08 DIAGNOSIS — R1319 Other dysphagia: Secondary | ICD-10-CM | POA: Diagnosis not present

## 2020-10-08 DIAGNOSIS — N179 Acute kidney failure, unspecified: Secondary | ICD-10-CM | POA: Diagnosis present

## 2020-10-08 DIAGNOSIS — I10 Essential (primary) hypertension: Secondary | ICD-10-CM | POA: Diagnosis not present

## 2020-10-08 DIAGNOSIS — K449 Diaphragmatic hernia without obstruction or gangrene: Secondary | ICD-10-CM | POA: Diagnosis not present

## 2020-10-08 DIAGNOSIS — E876 Hypokalemia: Secondary | ICD-10-CM | POA: Diagnosis present

## 2020-10-08 DIAGNOSIS — I739 Peripheral vascular disease, unspecified: Secondary | ICD-10-CM | POA: Diagnosis present

## 2020-10-08 DIAGNOSIS — F17210 Nicotine dependence, cigarettes, uncomplicated: Secondary | ICD-10-CM | POA: Diagnosis present

## 2020-10-08 DIAGNOSIS — K2270 Barrett's esophagus without dysplasia: Secondary | ICD-10-CM | POA: Diagnosis not present

## 2020-10-08 DIAGNOSIS — E0590 Thyrotoxicosis, unspecified without thyrotoxic crisis or storm: Secondary | ICD-10-CM | POA: Diagnosis present

## 2020-10-08 DIAGNOSIS — R1314 Dysphagia, pharyngoesophageal phase: Secondary | ICD-10-CM | POA: Diagnosis present

## 2020-10-08 DIAGNOSIS — E874 Mixed disorder of acid-base balance: Secondary | ICD-10-CM | POA: Diagnosis present

## 2020-10-08 HISTORY — PX: ESOPHAGOGASTRODUODENOSCOPY (EGD) WITH PROPOFOL: SHX5813

## 2020-10-08 LAB — BASIC METABOLIC PANEL
Anion gap: 10 (ref 5–15)
BUN: 16 mg/dL (ref 6–20)
CO2: 20 mmol/L — ABNORMAL LOW (ref 22–32)
Calcium: 8.7 mg/dL — ABNORMAL LOW (ref 8.9–10.3)
Chloride: 107 mmol/L (ref 98–111)
Creatinine, Ser: 1.04 mg/dL (ref 0.61–1.24)
GFR, Estimated: >60 mL/min (ref >60)
Glucose, Bld: 93 mg/dL (ref 70–99)
Potassium: 3.1 mmol/L — ABNORMAL LOW (ref 3.5–5.1)
Sodium: 137 mmol/L (ref 135–145)

## 2020-10-08 LAB — CBC
HCT: 38.5 % — ABNORMAL LOW (ref 39.0–52.0)
Hemoglobin: 13.6 g/dL (ref 13.0–17.0)
MCH: 30.9 pg (ref 26.0–34.0)
MCHC: 35.3 g/dL (ref 30.0–36.0)
MCV: 87.5 fL (ref 80.0–100.0)
Platelets: 327 10*3/uL (ref 150–400)
RBC: 4.4 MIL/uL (ref 4.22–5.81)
RDW: 12.2 % (ref 11.5–15.5)
WBC: 8.2 10*3/uL (ref 4.0–10.5)
nRBC: 0 % (ref 0.0–0.2)

## 2020-10-08 LAB — TSH: TSH: 0.167 u[IU]/mL — ABNORMAL LOW (ref 0.350–4.500)

## 2020-10-08 LAB — T4, FREE: Free T4: 1.08 ng/dL (ref 0.61–1.12)

## 2020-10-08 LAB — RESP PANEL BY RT-PCR (FLU A&B, COVID) ARPGX2
Influenza A by PCR: NEGATIVE
Influenza B by PCR: NEGATIVE
SARS Coronavirus 2 by RT PCR: NEGATIVE

## 2020-10-08 LAB — HIV ANTIBODY (ROUTINE TESTING W REFLEX): HIV Screen 4th Generation wRfx: NONREACTIVE

## 2020-10-08 LAB — MAGNESIUM: Magnesium: 2 mg/dL (ref 1.7–2.4)

## 2020-10-08 LAB — ETHANOL: Alcohol, Ethyl (B): <10 mg/dL (ref <10)

## 2020-10-08 SURGERY — ESOPHAGOGASTRODUODENOSCOPY (EGD) WITH PROPOFOL
Anesthesia: General

## 2020-10-08 MED ORDER — ENOXAPARIN SODIUM 40 MG/0.4ML IJ SOSY: 40.0000 mg | PREFILLED_SYRINGE | INTRAMUSCULAR | Status: DC

## 2020-10-08 MED ORDER — POTASSIUM CHLORIDE 10 MEQ/100ML IV SOLN
10.0000 meq | INTRAVENOUS | Status: AC
Start: 2020-10-08 — End: 2020-10-08
  Administered 2020-10-08 (×5): 10 meq via INTRAVENOUS
  Filled 2020-10-08 (×4): qty 100

## 2020-10-08 MED ORDER — BUPRENORPHINE HCL-NALOXONE HCL 8-2 MG SL SUBL
1.0000 | SUBLINGUAL_TABLET | Freq: Once | SUBLINGUAL | Status: DC
  Filled 2020-10-08: qty 1

## 2020-10-08 MED ORDER — SODIUM CHLORIDE 0.9 % IV SOLN: INTRAVENOUS | Status: DC | PRN

## 2020-10-08 MED ORDER — DEXMEDETOMIDINE (PRECEDEX) IN NS 20 MCG/5ML (4 MCG/ML) IV SYRINGE
PREFILLED_SYRINGE | INTRAVENOUS | Status: DC | PRN
  Administered 2020-10-08: 8 ug via INTRAVENOUS
  Administered 2020-10-08: 12 ug via INTRAVENOUS

## 2020-10-08 MED ORDER — LORAZEPAM 2 MG/ML IJ SOLN
1.0000 mg | Freq: Four times a day (QID) | INTRAMUSCULAR | Status: DC | PRN
  Administered 2020-10-08 – 2020-10-09 (×2): 1 mg via INTRAVENOUS
  Filled 2020-10-08 (×2): qty 1

## 2020-10-08 MED ORDER — ROCURONIUM BROMIDE 10 MG/ML (PF) SYRINGE
PREFILLED_SYRINGE | INTRAVENOUS | Status: DC | PRN
  Administered 2020-10-08: 20 mg via INTRAVENOUS

## 2020-10-08 MED ORDER — SODIUM CHLORIDE 0.9 % IV SOLN
1000.0000 mL | INTRAVENOUS | Status: DC
  Administered 2020-10-08 – 2020-10-10 (×5): 1000 mL via INTRAVENOUS

## 2020-10-08 MED ORDER — SODIUM CHLORIDE 0.9 % IV BOLUS (SEPSIS)
1000.0000 mL | Freq: Once | INTRAVENOUS | Status: AC
  Administered 2020-10-08: 1000 mL via INTRAVENOUS

## 2020-10-08 MED ORDER — BUPRENORPHINE HCL-NALOXONE HCL 8-2 MG SL SUBL: 1.0000 | SUBLINGUAL_TABLET | Freq: Two times a day (BID) | SUBLINGUAL | Status: DC

## 2020-10-08 MED ORDER — ONDANSETRON HCL 4 MG PO TABS: 4.0000 mg | ORAL_TABLET | Freq: Four times a day (QID) | ORAL | Status: DC | PRN

## 2020-10-08 MED ORDER — HYDROXYZINE HCL 25 MG PO TABS
25.0000 mg | ORAL_TABLET | Freq: Three times a day (TID) | ORAL | Status: DC | PRN
  Administered 2020-10-08 (×2): 25 mg via ORAL
  Filled 2020-10-08 (×2): qty 1

## 2020-10-08 MED ORDER — NICOTINE 21 MG/24HR TD PT24
21.0000 mg | MEDICATED_PATCH | Freq: Every day | TRANSDERMAL | Status: DC
  Administered 2020-10-08 – 2020-10-10 (×3): 21 mg via TRANSDERMAL
  Filled 2020-10-08 (×3): qty 1

## 2020-10-08 MED ORDER — POTASSIUM CHLORIDE 10 MEQ/100ML IV SOLN
10.0000 meq | INTRAVENOUS | Status: AC
  Administered 2020-10-08 (×6): 10 meq via INTRAVENOUS
  Filled 2020-10-08 (×6): qty 100

## 2020-10-08 MED ORDER — LIDOCAINE 2% (20 MG/ML) 5 ML SYRINGE
INTRAMUSCULAR | Status: DC | PRN
  Administered 2020-10-08: 80 mg via INTRAVENOUS

## 2020-10-08 MED ORDER — SUCCINYLCHOLINE CHLORIDE 200 MG/10ML IV SOSY
PREFILLED_SYRINGE | INTRAVENOUS | Status: DC | PRN
  Administered 2020-10-08: 100 mg via INTRAVENOUS

## 2020-10-08 MED ORDER — BUPRENORPHINE HCL-NALOXONE HCL 8-2 MG SL SUBL
1.0000 | SUBLINGUAL_TABLET | Freq: Two times a day (BID) | SUBLINGUAL | Status: DC
  Administered 2020-10-08 – 2020-10-10 (×5): 1 via SUBLINGUAL
  Filled 2020-10-08 (×5): qty 1

## 2020-10-08 MED ORDER — FENTANYL CITRATE (PF) 100 MCG/2ML IJ SOLN
INTRAMUSCULAR | Status: DC | PRN
  Administered 2020-10-08 (×2): 50 ug via INTRAVENOUS

## 2020-10-08 MED ORDER — CLONIDINE HCL 0.1 MG PO TABS
0.1000 mg | ORAL_TABLET | ORAL | Status: DC | PRN
  Administered 2020-10-09: 0.1 mg via ORAL
  Filled 2020-10-08: qty 1

## 2020-10-08 MED ORDER — LORAZEPAM 2 MG/ML IJ SOLN: 1.0000 mg | Freq: Four times a day (QID) | INTRAMUSCULAR | Status: DC

## 2020-10-08 MED ORDER — LORAZEPAM 2 MG/ML IJ SOLN
2.0000 mg | Freq: Four times a day (QID) | INTRAMUSCULAR | Status: DC
  Filled 2020-10-08: qty 1

## 2020-10-08 MED ORDER — PANTOPRAZOLE SODIUM 40 MG IV SOLR
40.0000 mg | Freq: Two times a day (BID) | INTRAVENOUS | Status: DC
  Administered 2020-10-08 – 2020-10-10 (×5): 40 mg via INTRAVENOUS
  Filled 2020-10-08 (×5): qty 40

## 2020-10-08 MED ORDER — SUGAMMADEX SODIUM 200 MG/2ML IV SOLN
INTRAVENOUS | Status: DC | PRN
  Administered 2020-10-08: 200 mg via INTRAVENOUS

## 2020-10-08 MED ORDER — PANTOPRAZOLE SODIUM 40 MG IV SOLR: 40.0000 mg | INTRAVENOUS | Status: DC

## 2020-10-08 MED ORDER — PROPOFOL 10 MG/ML IV BOLUS
INTRAVENOUS | Status: DC | PRN
  Administered 2020-10-08: 40 mg via INTRAVENOUS
  Administered 2020-10-08: 130 mg via INTRAVENOUS

## 2020-10-08 MED ORDER — ONDANSETRON HCL 4 MG/2ML IJ SOLN: 4.0000 mg | Freq: Four times a day (QID) | INTRAMUSCULAR | Status: DC | PRN

## 2020-10-08 MED ORDER — MIDAZOLAM HCL 2 MG/2ML IJ SOLN
INTRAMUSCULAR | Status: DC | PRN
  Administered 2020-10-08 (×2): 1 mg via INTRAVENOUS

## 2020-10-08 SURGICAL SUPPLY — 14 items

## 2020-10-08 NOTE — Plan of Care (Signed)
  Problem: Activity: Goal: Risk for activity intolerance will decrease Outcome: Progressing   Problem: Nutrition: Goal: Adequate nutrition will be maintained Outcome: Progressing   Problem: Pain Managment: Goal: General experience of comfort will improve Outcome: Progressing   

## 2020-10-08 NOTE — Progress Notes (Signed)
Pt arrived to unit, alert and oriented x 4. Will follow up orders

## 2020-10-08 NOTE — Progress Notes (Signed)
This patient is in severe opioid withdrawal and should be on a closely monitored unit.  We have a call out to the medical team to discuss.   Amada Jupiter, MD    [facility]
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
## 2020-10-08 NOTE — Progress Notes (Signed)
Subjective:   This morning Vincent Clarke was resting comfortably in bed. He states that he was in his usual state of health prior to Thursday when he began experiencing unexpected regurgitation of both solids and liquids. He denies any sensation of food getting stuck in his throat, change of voice, nausea, or abdominal pain outside of hunger pain. We discussed the plan for endoscopy today, and he was agreeable. He does report taking opioids yesterday and feels like he is actively withdrawing this morning. Nursing reported after I left that he had started to become restless.  UPDATE 12:30pm: Patient tolerated endoscopy procedure well, although I was paged by Dr. Myrtie Neither that he appeared to be in severe withdrawal upon extubation. He was given Precedex in the endoscopy suite and transferred to progressive.  Objective:  Vital signs in last 24 hours: Vitals:   10/08/20 1215 10/08/20 1223 10/08/20 1225 10/08/20 1251  BP: (!) 160/69 (!) 141/81  (!) 119/55  Pulse: (!) 106 (!) 102 98 91  Resp: (!) 29 17 (!) 27 12  Temp:    99 F (37.2 C)  TempSrc:    Oral  SpO2: 100% 98% 99% 99%  Weight:      Height:       Morning Exam: General: Patient appears chronically ill and slightly restless, although resting in no acute distress.  Eyes: Sclera non-icteric. No conjunctival injection.  HENT: MMM. Poor dentition.  Respiratory: Lungs are CTA, bilaterally. No wheezes, rales, or rhonchi.  Cardiovascular: Regular rate and rhythm. There is a 2/6 systolic murmur heard over the right upper chest. No other m/r/g appreciated.  Abdominal: Soft, non-distended and non-tender to palpation with intact bowel sounds throughout and no palpable masses.  Neurological: Awake, alert and oriented x 4. No dysarthria or aphasia. No focal neurological deficits noted.  Skin: Prior G-tub ostomy has healed well without active drainage. No lesions or rashes noted.  Psych: Patient appears slightly restless. Normal tone of voice.    Update as of ~12:30pm:  Patient was not responsive to voice with eyes shut with intermittent myoclonic jerking movements of his body. He was slightly tachycardic although vitals were otherwise unremarkable.   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   Stable Benign-Appearing Esophageal Stenosis  GERD w/ Established Barrett's Esophagus  Patient stated this morning that he has not been able to keep solids or liquids down without vomiting since Thursday, 10/04/20. It is possible he could have had food impaction in his esophagus; however, EGD today showed only a stable, intrinsic, benign-appearing 1.4cm x 1cm segment of stenosis with adjacent pseudo-diverticulum and established long-segment Barrett's disease in the lower third of the esophagus that appear unchanged from <1 year ago. The stomach and duodenum appeared normal, and given biopsies taken within the past year, no biopsies were taken.  - GI recommend soft diet for now in the setting of acute drug withdrawal (eat slowly with plenty of fluids)  - Would benefit from follow up with Dr. Adela Lank PRN outpatient  -  Continue IV Protonix 40mg  BID for now  - Will continue SCD's for DVT PPx for now in the setting of severe acute withdrawal   Acute Opioid Withdrawal  Hx OUD and TUD Patient endorsed using opioids once weekly last night and this morning, although  informed GI staff that he uses fentanyl and heroine daily (reported intranasal use on admission of heroine). He did appear slightly restless this morning but was noted by GI to be in severe withdrawal upon awakening from endoscopy. Previous toxassure earlier this month was positive for buprenorphine as expected but also carboxy-THC and fentanyl. He has regular follow up with the OUD and is compliant with suboxone 8-2mg  film twice daily. - Received Precedex in endoscopy suite  - Transferred to progressive unit  - Given 2nd dose Suboxone 8-2mg  SL this afternoon; will give additional dose this evening   - 1mg  Ativan IV once PRN for worsening agitation (not yet needed)  - Added clonidine 0.1mg  q1 hour PRN for anxiety / restlessness  - Continue nicotine patch 21mg  daily  - Will try to obtain urine drug screen (including alcohol)  - Start COWS monitoring  - Close monitoring of vital signs  - Check STAT CBC and TSH   Acute kidney injury Hypokalemia BUN/Cr 15/1.26 (baseline sCr 0.7-0.8). K 3.1, Mg 2.0. Suspect this is prerenal (although also consider drug-induced renal injury). Received 1 L NS bolus in the ED. - continue NS @ 125 cc/hr while unable to tolerate NPO  - potassium chloride 10 mEq IV x 5 - Check STAT repeat BMP   Hypertension Blood pressures have been highly variable (in the setting of fluids and procedure today as well as withdrawal). Reports he has tried to take his home lisinopril-HCTZ 20-25 mg over the last few days but is not sure if it has gone down. - Continue to hold antihypertensives for now   Generalized anxiety disorder Insomnia On admission, patient reported his anxiety is well-controlled without medication. Was unable to tolerate recent trial of citalopram due to sexual dysfunction. Takes trazodone nightly. - Will resume home trazodone 50-100 mg nightly once no longer NPO  Diet: NPO VTE: SCD's alone for now  IVF: NS,125cc/hr Code: Full  Prior to Admission Living  Arrangement: Home Anticipated Discharge Location: Home Barriers to Discharge: Apparent Acute Withdrawal; pending U-Tox  Dispo: Anticipated discharge in approximately 1-2 days   , MD PGY1 10/08/2020, 1:28 PM Pager: [phone] After 5pm on weekdays and 1pm on weekends: On Call pager [phone]
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
## 2020-10-08 NOTE — Progress Notes (Signed)
   10/08/20 1251  Assess: MEWS Score  Temp 99 F (37.2 C)  BP (!) 119/55  Pulse Rate 91  Resp 12  Level of Consciousness Responds to Pain  SpO2 99 %  O2 Device Room Air  Assess: MEWS Score  MEWS Temp 0  MEWS Systolic 0  MEWS Pulse 0  MEWS RR 1  MEWS LOC 2  MEWS Score 3  MEWS Score Color Yellow  Assess: if the MEWS score is Yellow or Red  Were vital signs taken at a resting state? Yes  Treat  MEWS Interventions Escalated (See documentation below)  Pain Scale PAINAD  Pain Score 0  Take Vital Signs  Increase Vital Sign Frequency  Yellow: Q 2hr X 2 then Q 4hr X 2, if remains yellow, continue Q 4hrs  Escalate  MEWS: Escalate Yellow: discuss with charge nurse/RN and consider discussing with provider and RRT  Notify: Charge Nurse/RN  Name of Charge Nurse/RN Notified Kristie  Date Charge Nurse/RN Notified 10/08/20  Time Charge Nurse/RN Notified 1330  Notify: Provider  Provider Name/Title Eliezer Bottom  Date Provider Notified 10/08/20  Time Provider Notified 1300  Notification Type Face-to-face  Notification Reason Other (Comment) (level of care elevation requested)  Provider response At bedside  Date of Provider Response 10/08/20  Document  Patient Outcome Transferred/level of care increased  Progress note created (see row info) Yes  Pt returned from endoscopy in a sedated state. Procedure area staff and attending MD notified that pt needed to be transferred to progressive care. Pt however still very sedated at this time and barely responds to pain, MD aware. Waiting to transfer pt

## 2020-10-08 NOTE — Interval H&P Note (Signed)
History and Physical Interval Note:  10/08/2020 11:32 AM  Vincent Clarke  has presented today for surgery, with the diagnosis of esophageal food impaction.  The various methods of treatment have been discussed with the patient and family. After consideration of risks, benefits and other options for treatment, the patient has consented to  Procedure(s): ESOPHAGOGASTRODUODENOSCOPY (EGD) WITH PROPOFOL (N/A) as a surgical intervention.  The patient's history has been reviewed, patient examined, no change in status, stable for surgery.  I have reviewed the patient's chart and labs.  Questions were answered to the patient's satisfaction.     Charlie Pitter III

## 2020-10-08 NOTE — Anesthesia Preprocedure Evaluation (Addendum)
Anesthesia Evaluation  Patient identified by MRN, date of birth, ID band Patient awake    Reviewed: Allergy & Precautions, NPO status , Patient's Chart, lab work & pertinent test results  Airway Mallampati: II  TM Distance: >3 FB Neck ROM: Full    Dental  (+) Dental Advisory Given, Edentulous Upper, Edentulous Lower   Pulmonary asthma , Current Smoker and Patient abstained from smoking.,    Pulmonary exam normal breath sounds clear to auscultation       Cardiovascular hypertension, Pt. on medications + Peripheral Vascular Disease (fem-tib bypass, left)  Normal cardiovascular exam Rhythm:Regular Rate:Normal     Neuro/Psych negative neurological ROS     GI/Hepatic hiatal hernia, PUD, GERD  Medicated,(+)     substance abuse  IV drug use, esophageal food impaction   Endo/Other  negative endocrine ROS  Renal/GU negative Renal ROS     Musculoskeletal  (+) Arthritis , narcotic dependent  Abdominal   Peds  Hematology negative hematology ROS (+)   Anesthesia Other Findings Day of surgery medications reviewed with the patient.  Reproductive/Obstetrics                             Anesthesia Physical Anesthesia Plan  ASA: III  Anesthesia Plan: General   Post-op Pain Management:    Induction: Intravenous  PONV Risk Score and Plan: 1 and Treatment may vary due to age or medical condition, Dexamethasone and Ondansetron  Airway Management Planned: Oral ETT  Additional Equipment:   Intra-op Plan:   Post-operative Plan: Extubation in OR  Informed Consent: I have reviewed the patients History and Physical, chart, labs and discussed the procedure including the risks, benefits and alternatives for the proposed anesthesia with the patient or authorized representative who has indicated his/her understanding and acceptance.     Dental advisory given  Plan Discussed with: CRNA  Anesthesia  Plan Comments:         Anesthesia Quick Evaluation

## 2020-10-08 NOTE — Progress Notes (Signed)
Picked patient up from unit for upper endoscopy and patient was in withdrawals but still able to communicate with staff, just very fidgety. After upper endoscopy in recovery patient much more fidgety with stable vitals, on room air. Called primary team to the bedside and it was decided to take patient back to 6N, give ativan for withdrawals and get him a bed for a SD unit. Transported to 6N with resident. Gave bedside report to RN and hooked him up to a bedside monitor. Resident at bedside. Patient has a SD bed ready on [address].

## 2020-10-08 NOTE — ED Notes (Signed)
Report attempted x 1

## 2020-10-08 NOTE — Progress Notes (Addendum)
INTERVAL PROGRESS NOTE:  Paged by RN in endoscopy suite regarding patient experiencing withdrawal post-EGD. Patient evaluated at bedside. Prior to arrival, patient given small dose of precedex for agitation. He appears in acute distress without being able to find position of comfort.   Blood pressure (!) 119/55, pulse 91, temperature 99.8 F (37.7 C), temperature source Oral, resp. rate 12, height 5\' 5"  (1.651 m), weight 73.9 kg, SpO2 99 %. Physical Exam  Constitutional: In distress  HENT: Normocephalic and atraumatic, intermittently yawning  Cardiovascular: RRR. S1/S2 present, no m/r/g Respiratory: No respiratory distress, Lungs are clear to auscultation bilaterally. Maintaining saturations >95% on room air, protecting airways  Neurological: Responsive to painful stimuli, spontaneously moving all extremities  Skin: Warm and dry.    Patient's presentation consistent with acute/severe opioid withdrawal. He did receive suboxone this AM; however, reportedly had a relapse few days ago. Initially ordered for patient to receive Ativan and suboxone; however, he is too altered for this at this time. Although only responsive to painful stimuli at this time, he is protecting his airways appropriately and vitals are stable. He is not appropriate for ICU level care at this time but does have high risk of decompensation.   Plan: Transfer to step down unit to monitor acute withdrawal  COWS q4h  Suboxone 8-2mg  bid + one dose now  IV Ativan 1mg  q6h prn for anxiety  Clonidine 0.1mg  q1h prn for anxiety and restlesness with hold parameters for hypotension  BMP and CBC STAT

## 2020-10-08 NOTE — Transfer of Care (Signed)
Immediate Anesthesia Transfer of Care Note  Patient: Vincent Clarke  Procedure(s) Performed: ESOPHAGOGASTRODUODENOSCOPY (EGD) WITH PROPOFOL (N/A )  Patient Location: Endoscopy Unit  Anesthesia Type:General  Level of Consciousness: sedated  Airway & Oxygen Therapy: Patient Spontanous Breathing and Patient connected to nasal cannula oxygen  Post-op Assessment: Report given to RN, Post -op Vital signs reviewed and stable and Patient moving all extremities  Post vital signs: Reviewed and stable  Last Vitals:  Vitals Value Taken Time  BP 141/81 10/08/20 1221  Temp    Pulse 102 10/08/20 1222  Resp 17 10/08/20 1222  SpO2 98 % 10/08/20 1222  Vitals shown include unvalidated device data.  Last Pain:  Vitals:   10/08/20 1215  TempSrc:   PainSc: 0-No pain         Complications: No complications documented.

## 2020-10-08 NOTE — Op Note (Signed)
[facility] Patient Name: Vincent Clarke Procedure Date : 10/08/2020 MRN: [id number] Attending MD: Starr Lake. Myrtie Neither , MD Date of Birth: 1965-05-20 CSN: [id number] Age: 55 Admit Type: Inpatient Procedure:                Upper GI endoscopy Indications:              Esophageal dysphagia, Stricture of the esophagus,                            Suspected esophageal food bolus impaction (patient                            reported inability to eat or drink for 3-4 days)                           Patient is currently in active opioid withdrawal Providers:                Sherilyn Cooter L. Myrtie Neither, MD, Blenda Mounts, RN, Lawson Radar, Technician, Orvilla Fus, CRNA Referring MD:             Medical Teaching Service Medicines:                General Anesthesia Complications:            No immediate complications. Estimated Blood Loss:     Estimated blood loss: none. Procedure:                Pre-Anesthesia Assessment:                           - Prior to the procedure, a History and Physical                            was performed, and patient medications and                            allergies were reviewed. The patient's tolerance of                            previous anesthesia was also reviewed. The risks                            and benefits of the procedure and the sedation                            options and risks were discussed with the patient.                            All questions were answered, and informed consent                            was obtained. Prior Anticoagulants: The patient has  taken no previous anticoagulant or antiplatelet                            agents. ASA Grade Assessment: IV - A patient with                            severe systemic disease that is a constant threat                            to life (active opioid withdrawal). After reviewing                            the risks and benefits, the  patient was deemed in                            satisfactory condition to undergo the procedure.                           After obtaining informed consent, the endoscope was                            passed under direct vision. Throughout the                            procedure, the patient's blood pressure, pulse, and                            oxygen saturations were monitored continuously. The                            GIF-H190 ([id number] Olympus gastroscope was                            introduced through the mouth, and advanced to the                            second part of duodenum. The upper GI endoscopy was                            accomplished without difficulty. The patient                            tolerated the procedure well. Scope In: Scope Out: Findings:      One benign-appearing, intrinsic mild stenosis was found 32 cm from the       incisors. This stenosis measured 1.4 cm (inner diameter) x 1 cm (in       length). The stenosis was traversed. There was ad adjacent       pseudo-diverticulum. Overall, appearance similar to last EGD. There was       no inflammation or ulceration to suggest that there had been a prolonged       impaction. No dilation performed. No food impaction.      There were esophageal mucosal changes secondary to established       long-segment  Barrett's disease present in the lower third of the       esophagus. The maximum longitudinal extent of these mucosal changes was       4 cm in length (34 to 38 cm from teeth). Examined under both WL and NBI,       no suspicious areas. Was last biopsied < 1 year ago, so not biopsied on       this occasion.      The stomach was normal.      The cardia and gastric fundus were normal on retroflexion.      The examined duodenum was normal. Impression:               - Stabel, benign-appearing esophageal stenosis.                           - Esophageal mucosal changes secondary to                             established long-segment Barrett's disease.                           - Normal stomach.                           - Normal examined duodenum.                           - No specimens collected. Recommendation:           - Return patient to hospital ward for ongoing care.                           - Soft diet during opidoid withdrawal episode.                            Afterward, cut and chew food well ( and invest in                            dentures if feasible). Eat slowly and with plenty                            of liquids.                           - Continue present medications.                           - Follow up in office as needed with Dr. Adela Lank. Procedure Code(s):        --- Professional ---                           [phone], Esophagogastroduodenoscopy, flexible,                            transoral; diagnostic, including collection of  specimen(s) by brushing or washing, when performed                            (separate procedure) Diagnosis Code(s):        --- Professional ---                           K22.2, Esophageal obstruction                           K22.70, Barrett's esophagus without dysplasia                           R13.14, Dysphagia, pharyngoesophageal phase CPT copyright 2019 [employer]. All rights reserved. The codes documented in this report are preliminary and upon coder review may  be revised to meet current compliance requirements. [name], MD 10/08/2020 12:10:07 PM This report has been signed electronically. Number of Addenda: 0

## 2020-10-08 NOTE — Anesthesia Postprocedure Evaluation (Signed)
Anesthesia Post Note  Patient: Vincent Clarke  Procedure(s) Performed: ESOPHAGOGASTRODUODENOSCOPY (EGD) WITH PROPOFOL (N/A )     Patient location during evaluation: PACU Anesthesia Type: General Level of consciousness: awake and alert Pain management: pain level controlled Vital Signs Assessment: post-procedure vital signs reviewed and stable Respiratory status: spontaneous breathing, nonlabored ventilation, respiratory function stable and patient connected to nasal cannula oxygen Cardiovascular status: blood pressure returned to baseline and stable Postop Assessment: no apparent nausea or vomiting Anesthetic complications: no   No complications documented.  Last Vitals:  Vitals:   10/08/20 1400 10/08/20 1640  BP: 135/67 130/85  Pulse: 92 100  Resp: 18 19  Temp: 37.1 C 36.9 C  SpO2:  98%    Last Pain:  Vitals:   10/08/20 1640  TempSrc: Oral  PainSc:                  Cecile Hearing
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
## 2020-10-08 NOTE — Progress Notes (Signed)
Rapid covid test done, specimen sent to lab
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
## 2020-10-08 NOTE — Anesthesia Procedure Notes (Addendum)
Procedure Name: Intubation Date/Time: 10/08/2020 11:49 AM Performed by: Leonor Liv, CRNA Pre-anesthesia Checklist: Patient identified, Emergency Drugs available, Suction available and Patient being monitored Patient Re-evaluated:Patient Re-evaluated prior to induction Oxygen Delivery Method: Circle System Utilized Preoxygenation: Pre-oxygenation with 100% oxygen Induction Type: IV induction, Rapid sequence and Cricoid Pressure applied Laryngoscope Size: Mac and 4 Grade View: Grade I Tube type: Oral Tube size: 7.5 mm Number of attempts: 1 Airway Equipment and Method: Stylet and Oral airway Placement Confirmation: ETT inserted through vocal cords under direct vision,  positive ETCO2 and breath sounds checked- equal and bilateral Secured at: 23 cm Tube secured with: Tape Dental Injury: Teeth and Oropharynx as per pre-operative assessment

## 2020-10-09 DIAGNOSIS — K222 Esophageal obstruction: Principal | ICD-10-CM

## 2020-10-09 LAB — BLOOD GAS, VENOUS
Acid-base deficit: 5.9 mmol/L — ABNORMAL HIGH (ref 0.0–2.0)
Bicarbonate: 17.7 mmol/L — ABNORMAL LOW (ref 20.0–28.0)
Drawn by: 5604
FIO2: 21
O2 Saturation: 85.5 %
Patient temperature: 36.9
pCO2, Ven: 27.5 mmHg — ABNORMAL LOW (ref 44.0–60.0)
pH, Ven: 7.425 (ref 7.250–7.430)
pO2, Ven: 49.8 mmHg — ABNORMAL HIGH (ref 32.0–45.0)

## 2020-10-09 LAB — CBC
HCT: 35.8 % — ABNORMAL LOW (ref 39.0–52.0)
Hemoglobin: 12.8 g/dL — ABNORMAL LOW (ref 13.0–17.0)
MCH: 31.2 pg (ref 26.0–34.0)
MCHC: 35.8 g/dL (ref 30.0–36.0)
MCV: 87.3 fL (ref 80.0–100.0)
Platelets: 329 10*3/uL (ref 150–400)
RBC: 4.1 MIL/uL — ABNORMAL LOW (ref 4.22–5.81)
RDW: 12.4 % (ref 11.5–15.5)
WBC: 10.4 10*3/uL (ref 4.0–10.5)
nRBC: 0 % (ref 0.0–0.2)

## 2020-10-09 LAB — MAGNESIUM: Magnesium: 2 mg/dL (ref 1.7–2.4)

## 2020-10-09 LAB — COMPREHENSIVE METABOLIC PANEL
ALT: 21 U/L (ref 0–44)
AST: 23 U/L (ref 15–41)
Albumin: 3.4 g/dL — ABNORMAL LOW (ref 3.5–5.0)
Alkaline Phosphatase: 75 U/L (ref 38–126)
Anion gap: 11 (ref 5–15)
BUN: 13 mg/dL (ref 6–20)
CO2: 17 mmol/L — ABNORMAL LOW (ref 22–32)
Calcium: 8.7 mg/dL — ABNORMAL LOW (ref 8.9–10.3)
Chloride: 107 mmol/L (ref 98–111)
Creatinine, Ser: 0.95 mg/dL (ref 0.61–1.24)
GFR, Estimated: >60 mL/min (ref >60)
Glucose, Bld: 87 mg/dL (ref 70–99)
Potassium: 3.1 mmol/L — ABNORMAL LOW (ref 3.5–5.1)
Sodium: 135 mmol/L (ref 135–145)
Total Bilirubin: 0.7 mg/dL (ref 0.3–1.2)
Total Protein: 6.1 g/dL — ABNORMAL LOW (ref 6.5–8.1)

## 2020-10-09 LAB — POTASSIUM: Potassium: 3.8 mmol/L (ref 3.5–5.1)

## 2020-10-09 MED ORDER — ORAL CARE MOUTH RINSE
15.0000 mL | Freq: Two times a day (BID) | OROMUCOSAL | Status: DC
  Administered 2020-10-09 – 2020-10-10 (×2): 15 mL via OROMUCOSAL

## 2020-10-09 MED ORDER — POTASSIUM CHLORIDE 10 MEQ/100ML IV SOLN
10.0000 meq | INTRAVENOUS | Status: AC
  Administered 2020-10-09 (×6): 10 meq via INTRAVENOUS
  Filled 2020-10-09 (×6): qty 100

## 2020-10-09 MED ORDER — ONDANSETRON HCL 4 MG/2ML IJ SOLN
4.0000 mg | Freq: Four times a day (QID) | INTRAMUSCULAR | Status: DC | PRN
  Filled 2020-10-09: qty 2

## 2020-10-09 MED ORDER — HYDROXYZINE HCL 25 MG PO TABS
25.0000 mg | ORAL_TABLET | Freq: Four times a day (QID) | ORAL | Status: DC | PRN
  Filled 2020-10-09: qty 1

## 2020-10-09 MED ORDER — CLONIDINE HCL 0.1 MG PO TABS: 0.1000 mg | ORAL_TABLET | ORAL | Status: DC | PRN

## 2020-10-09 MED ORDER — ENOXAPARIN SODIUM 40 MG/0.4ML IJ SOSY
40.0000 mg | PREFILLED_SYRINGE | INTRAMUSCULAR | Status: DC
  Administered 2020-10-09: 40 mg via SUBCUTANEOUS
  Filled 2020-10-09: qty 0.4

## 2020-10-09 NOTE — Progress Notes (Signed)
Subjective:   This morning, [name] says he's feeling better than yesterday. He says yesterday he felt very nauseas and anxious. He continues to say he was having trouble swallowing yesterday, although says he has been able to drink water without troubles this morning. Denies abdominal pain, diarrhea, fevers, chills, or diffuse body pain. He says he last used 1g of intranasal fentanyl the day prior to coming to the hospital but again denies any alcohol or any other drug use PTA. He does continue to feel as if he's withdrawing from opioids.   Objective:  Vital signs in last 24 hours: Vitals:   10/08/20 1640 10/08/20 1939 10/08/20 2325 10/09/20 0334  BP: 130/85 129/68 (!) 159/93 124/80  Pulse: 100 100 99 66  Resp: 19 18 20 20   Temp: 98.5 F (36.9 C) 98.5 F (36.9 C) 98.7 F (37.1 C) 98.5 F (36.9 C)  TempSrc: Oral Oral Oral Oral  SpO2: 98% 98% 100% 99%  Weight:      Height:       General: Patient appears chronically ill, uncomfortable, and slightly restless, although in no acute distress.  Eyes: Patient keeps eyes closed except to commands during exam. HENT: Slightly try mucus membranes. Poor dentition.   Respiratory: Lungs are CTA, bilaterally, anteriorly. No wheezes, rales, or rhonchi.  Cardiovascular: Regular rate and rhythm. No m/r/g. No LE edema.  Abdominal: Soft, non-distended and non-tender to palpation with intact bowel sounds throughout. Neurological: Patient is somnolent. He is fully oriented to situation. Unable to fully assess CN's, strength or reflexes, although does not appear to have any focal neurological deficits.  Skin: There is diffuse diffuse piloerection. No rashes noted.  Psych: Patient appears restless.   Assessment/Plan:  Principal Problem:   Esophageal stenosis Active Problems:   Tobacco use disorder   Severe opioid use disorder (HCC)   Esophageal obstruction due to food impaction  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  Stable Benign-Appearing Esophageal Stenosis  GERD w/ Established Barrett's Esophagus  EGD showed only a stable, intrinsic, benign-appearing 1.4cm x 1cm segment of stenosis with adjacent pseudo-diverticulum and established long-segment Barrett's disease in the lower third of the esophagus (unchanged). No bx's were taken and GI recommended outpatient follow up. He says he's been able to drink fluids without difficulty since yesterday.  - Consulted SLP for formal swallowing evaluation  - Follow up with Dr. 57 PRN outpatient  - Continue IV Protonix 40mg  BID until patient can reliably tolerate PO intake  - Started Lovenox for DVT PPx   Acute Opioid Withdrawal  Hx Severe OUD and TUD Patient reports last using fentanyl 1g the day prior to admission and feels better than yesterday (received ativan 1mg  twice, clonidine 0.1mg  once, and two doses of hydroxyzine in addition to evening Suboxone last night) although he does continue to feel he's in acute withdrawal from opioids and is mild withdrawal per COWS scoring. He has regular follow up with the OUD and is compliant with suboxone 8-2mg  film twice daily outpatient. - ETOH <10 and patient denies alcohol use  - Urine drug screen is pending  - Continue suboxone 8-2mg  BID  - Discontinued Ativan PRN  - Increased frequency  of Hydroxyzine 25mg  to QID PRN for anxiety/restlessness  - Clonidine 0.1mg  q1 hour PRN for anxiety/restlessness uncontrolled on above  - Encouraged Zofran 4mg  IV q6hrs PRN for nausea  - Continue nicotine patch 21mg  daily  - Continue COWS monitoring; will adjust  medications as needed  - Continuous telemetry   Respiratory Alkalosis w/ Compensatory Non-Anion Gap Metabolic Acidosis  Bicarb has dropped from 25 on admission to 17 today. VBG shows pH 7.425, pCO2 27.5 and bicarb of 17.7. He has been tachypneic, most consistent with compensated respiratory alkalosis; however, with persistent hypokalemia, he may have underlying RTA. - Continuous pulse oximetry  - Continue to monitor BMP  Acute kidney injury, Improving  Hypokalemia, Persistent Creatinine remains slightly elevated above baseline of 0.8, although improved since admission. Potassium remains 3.1 despite IV replacement. Suspect pre-renal injury although also consider drug-induced kidney injury.  - Monitor strict intake and output  - IV KCl total this morning - Monitor daily renal function and afternoon potassium   Hypertension Blood pressures have been highly variable although overall stable. Had been taking lisinopril-HCTZ 20-25mg  daily at home.  - Continue to hold antihypertensives for now   Subclinical Hyperthyroidism TSH low at 0.167 although free T4 is normal at 1.08. This may be in the setting of acute physiological stressors.  - Recommend outpatient follow up   Generalized anxiety disorder Insomnia On admission, patient reported his anxiety is well-controlled without medication. Was unable to tolerate recent trial of citalopram due to sexual dysfunction. Takes trazodone nightly. - Will resume home trazodone 50-100 mg nightly once reliably taking in PO   Diet: Soft Diet (pending SLP evaluation)  VTE: Lovenox 40mg  daily  IVF: NS,125cc/hr Code: Full  Prior to Admission Living Arrangement: Home Anticipated Discharge Location: Home Barriers to Discharge: Acute Opioid Withdrawal  Dispo: Anticipated discharge today vs. Tomorrow   , MD PGY1 10/09/2020, 11:38 AM Pager: [phone] After 5pm on weekdays and 1pm on weekends: On Call pager [phone]

## 2020-10-10 ENCOUNTER — Other Ambulatory Visit (HOSPITAL_COMMUNITY): Payer: Self-pay

## 2020-10-10 ENCOUNTER — Telehealth: Payer: Self-pay | Admitting: Internal Medicine

## 2020-10-10 LAB — URINE DRUGS OF ABUSE SCREEN W ALC, ROUTINE (REF LAB)
Amphetamines, Urine: NEGATIVE ng/mL
Barbiturate, Ur: NEGATIVE ng/mL
Benzodiazepine Quant, Ur: NEGATIVE ng/mL
Cannabinoid Quant, Ur: NEGATIVE ng/mL
Cocaine (Metab.): NEGATIVE ng/mL
Ethanol U, Quan: NEGATIVE %
Methadone Screen, Urine: NEGATIVE ng/mL
Opiate Quant, Ur: NEGATIVE ng/mL
Phencyclidine, Ur: NEGATIVE ng/mL
Propoxyphene, Urine: NEGATIVE ng/mL

## 2020-10-10 LAB — RENAL FUNCTION PANEL
Albumin: 2.8 g/dL — ABNORMAL LOW (ref 3.5–5.0)
Anion gap: 6 (ref 5–15)
BUN: 10 mg/dL (ref 6–20)
CO2: 23 mmol/L (ref 22–32)
Calcium: 8.5 mg/dL — ABNORMAL LOW (ref 8.9–10.3)
Chloride: 110 mmol/L (ref 98–111)
Creatinine, Ser: 0.82 mg/dL (ref 0.61–1.24)
GFR, Estimated: >60 mL/min (ref >60)
Glucose, Bld: 157 mg/dL — ABNORMAL HIGH (ref 70–99)
Phosphorus: 2.8 mg/dL (ref 2.5–4.6)
Potassium: 3.9 mmol/L (ref 3.5–5.1)
Sodium: 139 mmol/L (ref 135–145)

## 2020-10-10 LAB — CBC
HCT: 33.4 % — ABNORMAL LOW (ref 39.0–52.0)
Hemoglobin: 11.5 g/dL — ABNORMAL LOW (ref 13.0–17.0)
MCH: 31.2 pg (ref 26.0–34.0)
MCHC: 34.4 g/dL (ref 30.0–36.0)
MCV: 90.5 fL (ref 80.0–100.0)
Platelets: 258 10*3/uL (ref 150–400)
RBC: 3.69 MIL/uL — ABNORMAL LOW (ref 4.22–5.81)
RDW: 12.7 % (ref 11.5–15.5)
WBC: 7.7 10*3/uL (ref 4.0–10.5)
nRBC: 0 % (ref 0.0–0.2)

## 2020-10-10 NOTE — Telephone Encounter (Signed)
TOC HFU APPT [facility] [place] FOR 10/15/2020 @ 9:15 AM WITH DR WITH DR Claudette Laws

## 2020-10-10 NOTE — Discharge Summary (Signed)
Name: Vincent Clarke MRN: [id number] DOB: 07-Sep-1965 55 y.o. PCP: Reymundo Poll, MD  Date of Admission: 10/07/2020  8:46 PM Date of Discharge: 10/10/20  Attending Physician: Dr. Heide Spark  Discharge Diagnosis: Principal Problem:   Esophageal stenosis Active Problems:   Tobacco use disorder   Severe opioid use disorder (HCC)   Esophageal obstruction due to food impaction   Discharge Medications: Allergies as of 10/10/2020      Reactions   Flexeril [cyclobenzaprine] Swelling   Facial swelling   Tramadol Swelling      Medication List    STOP taking these medications   feeding supplement (JEVITY 1.5 CAL/FIBER) Liqd   feeding supplement (PRO-STAT SUGAR FREE 64) Liqd   free water Soln     TAKE these medications   acetaminophen 500 MG tablet Commonly known as: TYLENOL Take 2 tablets (1,000 mg total) by mouth every 6 (six) hours as needed. What changed: reasons to take this   lisinopril-hydrochlorothiazide 20-25 MG tablet Commonly known as: ZESTORETIC TAKE 1 TABLET BY MOUTH DAILY.   methocarbamol 500 MG tablet Commonly known as: ROBAXIN Take 2 tablets (1,000 mg total) by mouth every 6 (six) hours as needed for muscle spasms.   multivitamin with minerals Tabs tablet Take 1 tablet by mouth daily.   pantoprazole 40 MG tablet Commonly known as: PROTONIX Take 1 tablet (40 mg total) by mouth 2 (two) times daily before a meal.   Suboxone 8-2 MG Film Generic drug: Buprenorphine HCl-Naloxone HCl PLACE 1 FILM UNDER THE TONGUE 2 (TWO) TIMES DAILY.   traZODone 100 MG tablet Commonly known as: DESYREL Take 0.5-1 tablets (50-100 mg total) by mouth at bedtime.      Disposition and follow-up:   Vincent Clarke was discharged from [facility] in Stable condition.  At the hospital follow up visit please address:  1.  Follow-up:  A. Benign Esophageal Stenosis - Assess patient's swallowing.  Adherent to PPI    B. Opioid Withdrawal - Assess withdrawal  symptoms, adherent to Suboxone rules   c.  Respiratory alkalosis-assess respiratory status/bicarb   d.  History of hypertension-hypotensive up until discharge, continued on home antihypertensives  2.  Labs / imaging needed at time of follow-up: BMP  3.  Pending labs/ test needing follow-up: UDS  4.  Medication Changes  Started: None  Stopped: None  Changed: None  Follow-up Appointments:  Follow-up Information    Reymundo Poll, MD Follow up in 1 week(s).   Specialty: Internal Medicine Contact information: [address] [place] [address] [phone]               Hospital Course by problem list:   Stable benign-appearing esophageal stenosis GERD with established Barrett's esophagus Patient initially presented with difficulty swallowing after having [place] food.  During his admission and EGD was time he was found to have stable benign-appearing 1.4 cm x 1 cm esophageal stenosis and GERD consistent with Barrett's esophagus.  Patient stated that he was off his Protonix for some time.  We discussed with him the need to restart this, and take it regularly.  Slowly and steadily he is able to tolerate p.o. diet.  He was evaluated by speech recommended soft diet and instructed him to [facility] food thoroughly taking smaller bites and using liquid washes.  GI requested patient follow-up as needed for difficulty swallowing.  Acute Opioid withdrawal Patient with longstanding history of Suboxone use followed by the as directed clinic in the internal medicine center.  Prior  to admission he was using fentanyl 1 g a day.  His opioids were held prior to his EGD and patient was found to have acute episodes of withdrawal.  COWS protocol was initiated.  Patient denied alcohol use.  He was continued on Suboxone and clonidine as well as as needed Ativan, hydroxyzine were used.  Patient remained stable throughout his withdrawal symptoms and progressed well.  Upon discharge patient was without  withdrawal symptoms and noted feeling much better.  Patient was continued on Suboxone at discharge and will follow up with the ED clinic  AKI Hypokalemia Patient presented with a creatinine 1.26, baseline .7-.8.  Suspected to be prerenal and resolved with IV fluids.  History of hypertension During admission patient had variable blood pressure readings some hypotensive.  Suspect secondary to poor p.o. intake.  His lisinopril-HCTZ 20 to 25 mg combo were held.  We will plan to restart this upon discharge his blood pressures were elevated 140s over 80s.   Discharge Subjective: Endorses feeling well, resting bed comfortably.  No episodes of yawning.  Denies tremors.  Eating and drinking well without difficulty.  He was evaluated by speech this morning and noted to be able to tolerate p.o. diet.  Discussed with him the need to continue Protonix to prevent progression of Barrett's esophagus.  Patient to follow-up with GI doctors as needed.  Patient will also be seen in our clinic hopefully by the end of the week.  Patient stable and ready to be discharged.  Discharge Exam:   BP 140/85 (BP Location: Right Arm)   Pulse 68   Temp 98.6 F (37 C) (Oral)   Resp 18   Ht 5\' 5"  (1.651 m)   Wt 73.9 kg   SpO2 100%   BMI 27.12 kg/m  Constitutional: well-appearing, resting bed comfortably, no acute distress HENT: normocephalic atraumatic Eyes: conjunctiva non-erythematous Neck: supple Cardiovascular: regular rate and rhythm, no m/r/g Pulmonary/Chest: normal work of breathing on room air, lungs clear to auscultation bilaterally Abdominal: soft, non-tender, non-distended MSK: normal bulk and tone Neurological: alert & oriented x 3 Skin: warm and dry Psych: Normal mood and thought process  Pertinent Labs, Studies, and Procedures:  CBC Latest Ref Rng & Units 10/10/2020 10/09/2020 10/08/2020  WBC 4.0 - 10.5 K/uL 7.7 10.4 8.2  Hemoglobin 13.0 - 17.0 g/dL 11.5(L) 12.8(L) 13.6  Hematocrit 39.0 - 52.0 %  33.4(L) 35.8(L) 38.5(L)  Platelets 150 - 400 K/uL 258 329 327    CMP Latest Ref Rng & Units 10/10/2020 10/09/2020 10/09/2020  Glucose 70 - 99 mg/dL 10/11/2020) - 87  BUN 6 - 20 mg/dL 10 - 13  Creatinine 258(N - 1.24 mg/dL 2.77 - 8.24  Sodium 2.35 - 145 mmol/L 139 - 135  Potassium 3.5 - 5.1 mmol/L 3.9 3.8 3.1(L)  Chloride 98 - 111 mmol/L 110 - 107  CO2 22 - 32 mmol/L 23 - 17(L)  Calcium 8.9 - 10.3 mg/dL 361) - 8.7(L)  Total Protein 6.5 - 8.1 g/dL - - 6.1(L)  Total Bilirubin 0.3 - 1.2 mg/dL - - 0.7  Alkaline Phos 38 - 126 U/L - - 75  AST 15 - 41 U/L - - 23  ALT 0 - 44 U/L - - 21    No results found.   Discharge Instructions: Discharge Instructions    Call MD for:  difficulty breathing, headache or visual disturbances   Complete by: As directed    Call MD for:  persistant dizziness or light-headedness   Complete by: As directed  Call MD for:  redness, tenderness, or signs of infection (pain, swelling, redness, odor or green/yellow discharge around incision site)   Complete by: As directed    Call MD for:  severe uncontrolled pain   Complete by: As directed    Call MD for:  temperature >100.4   Complete by: As directed    Diet general   Complete by: As directed    Please follow the diet recommended by speech therapy, which includes mostly a soft diet.   Increase activity slowly   Complete by: As directed       Signed: Belva Agee, MD 10/10/2020, 1:23 PM   Pager: [phone]
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
## 2020-10-10 NOTE — Discharge Instructions (Addendum)
Mr. Waynick thank you for allowing [place] to care for you during this visit.  Initially having problems swallowing, however, on your EGD you are found to have a nonobstructing stenosis.  Please follow-up with the gastroenterology doctors as needed for this.  Please continue a soft diet as per instructions from the speech therapist.  He also had episodes of withdrawal.  During her hospital course he progressed well.  Please continue on her Suboxone at home and follow-up in the outpatient clinic.  Please attempt to refrain from using illicit substances while on Suboxone.  If you find yourself having increased cravings please schedule appointment to be seen by the opioid use disorder clinic.  I have reached out to the clinic for them to call you about a follow-up appointment.  For any questions please do not hesitate to reach out.  I am glad you are feeling better overall and I wish you the best  Dr. Evie Lacks

## 2020-10-11 ENCOUNTER — Telehealth: Payer: Self-pay

## 2020-10-11 NOTE — Telephone Encounter (Signed)
Transition Care Management Unsuccessful Follow-up Telephone Call  Date of discharge and from where:  10/10/2020 from [facility]  Attempts:  1st Attempt  Reason for unsuccessful TCM follow-up call:  Left voice message
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
## 2020-10-12 NOTE — Telephone Encounter (Signed)
Transition of Care call completed earlier today by [facility] Staff Services Quality Department. SChaplin, RN,BSN

## 2020-10-12 NOTE — Telephone Encounter (Signed)
Transition Care Management Follow-up Telephone Call  Date of discharge and from where: 10/10/2020 - [place]  How have you been since you were released from the hospital? "I am doing fine"  Any questions or concerns? No  Items Reviewed:  Did the pt receive and understand the discharge instructions provided? Yes   Medications obtained and verified? Yes   Other? No   Any new allergies since your discharge? No   Dietary orders reviewed? No  Do you have support at home? Yes    Functional Questionnaire: (I = Independent and D = Dependent) ADLs: I  Bathing/Dressing- I  Meal Prep- I  Eating- I  Maintaining continence- I  Transferring/Ambulation- I  Managing Meds- I  Follow up appointments reviewed:   PCP Hospital f/u appt confirmed? No    Specialist Hospital f/u appt confirmed? No    Are transportation arrangements needed? No   If their condition worsens, is the pt aware to call PCP or go to the Emergency Dept.? Yes  Was the patient provided with contact information for the PCP's office or ED? Yes  Was to pt encouraged to call back with questions or concerns? Yes

## 2020-10-15 ENCOUNTER — Telehealth: Payer: Self-pay | Admitting: *Deleted

## 2020-10-15 ENCOUNTER — Ambulatory Visit: Payer: Medicaid Other | Admitting: Student

## 2020-10-15 NOTE — Telephone Encounter (Signed)
PATIENT WAS CONTACTED REGARDING HIS MISSED APPOINTMENT. HE HAS BEEN RESCHEDULED.

## 2020-10-23 ENCOUNTER — Other Ambulatory Visit (HOSPITAL_COMMUNITY): Payer: Self-pay

## 2020-10-23 ENCOUNTER — Other Ambulatory Visit: Payer: Self-pay

## 2020-10-23 DIAGNOSIS — F1121 Opioid dependence, in remission: Secondary | ICD-10-CM

## 2020-10-23 MED ORDER — SUBOXONE 8-2 MG SL FILM
1.0000 | ORAL_FILM | Freq: Two times a day (BID) | SUBLINGUAL | 0 refills | Status: DC
  Filled 2020-10-23: qty 60, 30d supply, fill #0

## 2020-10-23 NOTE — Telephone Encounter (Signed)
Received TC from patient who states he had a HFU appt on 6/16 which he had to cancel d/t a death in the family. He also states he needs to schedule an OUD appt, but he does not know when he will be back in town d/t the funeral being out of town.  He is requesting a refill on his Suboxone, states he will be out on 10/26/20 and he can get his wife to pick it up before she heads out to meet him.  He states he will call back to r/s his HFU and his OUD appt, but as of now, he is unsure how long he will be out of town.  He does say he can do telehealth OUD if necessary.  Will forward to OUD attendings to advise. SChaplin, RN,BSN

## 2020-10-24 ENCOUNTER — Other Ambulatory Visit (HOSPITAL_COMMUNITY): Payer: Self-pay

## 2020-10-25 ENCOUNTER — Ambulatory Visit: Payer: Medicaid Other | Admitting: Student

## 2020-11-02 ENCOUNTER — Other Ambulatory Visit: Payer: Self-pay | Admitting: Internal Medicine

## 2020-11-02 ENCOUNTER — Other Ambulatory Visit (HOSPITAL_COMMUNITY): Payer: Self-pay

## 2020-11-05 ENCOUNTER — Other Ambulatory Visit (HOSPITAL_COMMUNITY): Payer: Self-pay

## 2020-11-05 MED ORDER — LISINOPRIL-HYDROCHLOROTHIAZIDE 20-25 MG PO TABS
1.0000 | ORAL_TABLET | Freq: Every day | ORAL | 1 refills | Status: DC
  Filled 2020-11-05: qty 30, 30d supply, fill #0
  Filled 2020-12-03: qty 30, 30d supply, fill #1

## 2020-11-05 NOTE — Telephone Encounter (Signed)
Patient needs an appointment for hospital follow up, looks like he canceled and no showed his last two appointments. Approved 30 day supply.

## 2020-11-06 ENCOUNTER — Encounter: Payer: Self-pay | Admitting: Internal Medicine

## 2020-11-20 ENCOUNTER — Other Ambulatory Visit (HOSPITAL_COMMUNITY): Payer: Self-pay

## 2020-11-20 ENCOUNTER — Other Ambulatory Visit: Payer: Self-pay | Admitting: Internal Medicine

## 2020-11-20 DIAGNOSIS — F1121 Opioid dependence, in remission: Secondary | ICD-10-CM

## 2020-11-20 MED ORDER — SUBOXONE 8-2 MG SL FILM
1.0000 | ORAL_FILM | Freq: Two times a day (BID) | SUBLINGUAL | 0 refills | Status: DC
  Filled 2020-11-20: qty 60, 30d supply, fill #0

## 2020-11-20 NOTE — Telephone Encounter (Signed)
Refill Request- Pt has sch an appt with Dr. Antony Contras on 12/12/2020 for an in person visit.  PT states he is still out of town on his job but, is requesting his medication to be refilled   SUBOXONE 8-2 MG FILM   Pharmacy: [facility] (Ph: [phone])

## 2020-11-21 ENCOUNTER — Other Ambulatory Visit (HOSPITAL_COMMUNITY): Payer: Self-pay

## 2020-11-23 ENCOUNTER — Other Ambulatory Visit (HOSPITAL_COMMUNITY): Payer: Self-pay

## 2020-11-26 ENCOUNTER — Other Ambulatory Visit (HOSPITAL_COMMUNITY): Payer: Self-pay

## 2020-12-03 ENCOUNTER — Other Ambulatory Visit (HOSPITAL_COMMUNITY): Payer: Self-pay

## 2020-12-11 ENCOUNTER — Other Ambulatory Visit (HOSPITAL_COMMUNITY): Payer: Self-pay

## 2020-12-12 ENCOUNTER — Encounter: Payer: Self-pay | Admitting: Internal Medicine

## 2020-12-12 ENCOUNTER — Ambulatory Visit: Payer: Medicaid Other | Admitting: Internal Medicine

## 2020-12-12 ENCOUNTER — Other Ambulatory Visit: Payer: Self-pay

## 2020-12-12 ENCOUNTER — Other Ambulatory Visit (HOSPITAL_COMMUNITY): Payer: Self-pay

## 2020-12-12 VITALS — BP 108/64 | HR 52 | Temp 98.2°F | Ht 66.0 in | Wt 156.3 lb

## 2020-12-12 DIAGNOSIS — F419 Anxiety disorder, unspecified: Secondary | ICD-10-CM | POA: Diagnosis not present

## 2020-12-12 DIAGNOSIS — F5105 Insomnia due to other mental disorder: Secondary | ICD-10-CM | POA: Diagnosis not present

## 2020-12-12 DIAGNOSIS — I10 Essential (primary) hypertension: Secondary | ICD-10-CM | POA: Diagnosis not present

## 2020-12-12 DIAGNOSIS — F1120 Opioid dependence, uncomplicated: Secondary | ICD-10-CM

## 2020-12-12 DIAGNOSIS — Z23 Encounter for immunization: Secondary | ICD-10-CM | POA: Diagnosis not present

## 2020-12-12 DIAGNOSIS — F1121 Opioid dependence, in remission: Secondary | ICD-10-CM | POA: Diagnosis not present

## 2020-12-12 DIAGNOSIS — K222 Esophageal obstruction: Secondary | ICD-10-CM

## 2020-12-12 MED ORDER — LISINOPRIL-HYDROCHLOROTHIAZIDE 20-25 MG PO TABS
1.0000 | ORAL_TABLET | Freq: Every day | ORAL | 3 refills | Status: DC
  Filled 2020-12-12: qty 90, 90d supply, fill #0
  Filled 2021-03-19: qty 90, 90d supply, fill #1
  Filled 2021-06-19: qty 90, 90d supply, fill #2
  Filled 2021-09-11: qty 90, 90d supply, fill #3

## 2020-12-12 MED ORDER — SUBOXONE 8-2 MG SL FILM
1.0000 | ORAL_FILM | Freq: Two times a day (BID) | SUBLINGUAL | 0 refills | Status: DC
  Filled 2020-12-12 – 2020-12-21 (×2): qty 60, 30d supply, fill #0

## 2020-12-12 MED ORDER — ZOSTER VAC RECOMB ADJUVANTED 50 MCG/0.5ML IM SUSR: 0.5000 mL | Freq: Once | INTRAMUSCULAR | 0 refills | Status: AC
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
## 2020-12-12 NOTE — Patient Instructions (Addendum)
Mr. Cohick,  It was a pleasure to see you. Please continue to take all of your medications as previously prescribed.   Follow up with [place] in 1 month for a telehealth appointment for your OUD. You can follow up with me again in 6 months.   If you have any questions or concerns, call our clinic at [phone] or after hours call [phone] and ask for the internal medicine resident on call.   Thank you!  Dr. Antony Contras

## 2020-12-14 ENCOUNTER — Encounter: Payer: Self-pay | Admitting: Internal Medicine

## 2020-12-14 DIAGNOSIS — Z23 Encounter for immunization: Secondary | ICD-10-CM | POA: Insufficient documentation

## 2020-12-14 NOTE — Assessment & Plan Note (Signed)
[name] presents for follow up of opioid use disorder I have reviewed the prior induction visit, follow up visits, and telephone encounters relevant to opiate use disorder (OUD) treatment.   Current daily dose: Suboxone 8-2 mg BID  Date of Induction: 06/10/2018  Current follow up interval, in weeks: 4 weeks  The patient has not been adherent with the buprenorphine for OUD contract.   Last UDS Result: Appropriate for very low levels of buprenorphine and metabolite, inappropriate for THC and fentanyl   Interval History, Assessment, and Plan:   Patient continues to relapse about once a week, but reports continued benefit with suboxone therapy. We have continued treatment as a risk reduction strategy since he has not injected IV drugs since starting suboxone. He does well during the week when he is working and busy, but has multiple on going stressors that cause him to relapse on his day off when he is not busy and distracted. His wife also suffers with OUD and makes it difficult for him to achieve complete remission. Plan to repeat Utox today. Reviewed PDMP, refill history is appropriate. I have sent a one month refill to his pharmacy. Plan for 1 month telehealth follow up.

## 2020-12-14 NOTE — Assessment & Plan Note (Signed)
Well controlled on lisinopril-hydrochlorothiazide 20-25 mg daily. Labs checked during recent admission with normal renal function. Continue current regimen.

## 2020-12-14 NOTE — Assessment & Plan Note (Signed)
Given prescription for shingrex today, instructed him to take it to his local pharmacy.

## 2020-12-14 NOTE — Assessment & Plan Note (Signed)
Doing well on trazodone. Takes only as needed.

## 2020-12-14 NOTE — Assessment & Plan Note (Signed)
Recently hospitalized for worsening dysphagia and food bolus impaction. Underwent repeat EGD on 10/08/20 which dosed stable benign appearing stenosis. Symptoms have since improved, denies difficulty with PO intake now. Continue Protonix 40 mg BID.

## 2020-12-15 LAB — TOXASSURE SELECT,+ANTIDEPR,UR

## 2020-12-20 ENCOUNTER — Other Ambulatory Visit (HOSPITAL_COMMUNITY): Payer: Self-pay

## 2020-12-21 ENCOUNTER — Other Ambulatory Visit (HOSPITAL_COMMUNITY): Payer: Self-pay

## 2021-01-09 ENCOUNTER — Encounter: Payer: Medicaid Other | Admitting: Internal Medicine

## 2021-01-15 ENCOUNTER — Other Ambulatory Visit (HOSPITAL_COMMUNITY): Payer: Self-pay

## 2021-01-21 ENCOUNTER — Other Ambulatory Visit (HOSPITAL_COMMUNITY): Payer: Self-pay

## 2021-01-21 ENCOUNTER — Other Ambulatory Visit: Payer: Self-pay

## 2021-01-21 DIAGNOSIS — F1121 Opioid dependence, in remission: Secondary | ICD-10-CM

## 2021-01-21 MED ORDER — SUBOXONE 8-2 MG SL FILM
1.0000 | ORAL_FILM | Freq: Two times a day (BID) | SUBLINGUAL | 0 refills | Status: DC
  Filled 2021-01-21: qty 60, 30d supply, fill #0

## 2021-01-21 NOTE — Telephone Encounter (Signed)
SUBOXONE 8-2 MG FILM, REFILL REQUEST @ [facility].

## 2021-02-06 ENCOUNTER — Other Ambulatory Visit (HOSPITAL_COMMUNITY): Payer: Self-pay

## 2021-02-06 ENCOUNTER — Other Ambulatory Visit: Payer: Self-pay

## 2021-02-06 ENCOUNTER — Ambulatory Visit (INDEPENDENT_AMBULATORY_CARE_PROVIDER_SITE_OTHER): Payer: Medicaid Other | Admitting: Internal Medicine

## 2021-02-06 ENCOUNTER — Encounter: Payer: Self-pay | Admitting: Internal Medicine

## 2021-02-06 DIAGNOSIS — F1120 Opioid dependence, uncomplicated: Secondary | ICD-10-CM

## 2021-02-06 DIAGNOSIS — M25572 Pain in left ankle and joints of left foot: Secondary | ICD-10-CM | POA: Diagnosis not present

## 2021-02-06 DIAGNOSIS — F1121 Opioid dependence, in remission: Secondary | ICD-10-CM | POA: Diagnosis not present

## 2021-02-06 MED ORDER — SUBOXONE 8-2 MG SL FILM
1.0000 | ORAL_FILM | Freq: Two times a day (BID) | SUBLINGUAL | 0 refills | Status: DC
Start: 2021-02-06 — End: 2021-03-21
  Filled 2021-02-06 – 2021-02-20 (×2): qty 60, 30d supply, fill #0
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
## 2021-02-06 NOTE — Assessment & Plan Note (Signed)
Patient presents for a telehealth appointment to follow up on his OUD. Since his last visit unfortunately, Vincent Clarke has been evicted from his home of 12 years and is currently living out of a motel. He and his wife are both on disability and currently looking for affordable housing. We discussed his last UDS results being inappropriate for high levels of morphine and fentanyl and only very low levels of buprenorphine. Despite being unhoused, he is actually doing better with his substance use. He no longer has access to buy  Fentanyl on the side of town where he is living and does not have a vehicle to access his dealer. He says that his last relapse was over a week ago. He still feels that the suboxone is helping and would like to continue with treatment. I encouraged him to reach out to the [employer] downtown [place] for additional assistance with finding housing. Will plan to continue suboxone for now as a risk reduction strategy for his OUD. Hopefully once he finds stable housing we can discuss setting a goal for complete abstinence. At the very least, he needs to demonstrate that he is regularly taking suboxone. I would like to see higher levels of buprenorphine in his urine (see result note from last Utox on 12/12/20).  -- Reviewed PDMP -- 1 month refill sent to his pharmacy  -- Follow up in person in 1 month; Utox at that visit

## 2021-02-06 NOTE — Progress Notes (Signed)
    This is a telephone encounter between [employer] and [employer] on 02/06/2021 for follow up of OUD. The visit was conducted with the patient located at  a motel  and Reymundo Poll at [facility]. The patient's identity was confirmed using their DOB and current address. The patient has consented to being evaluated through a telephone encounter and understands the associated risks (an examination cannot be done and the patient may need to come in for an appointment) / benefits (allows the patient to remain at home, decreasing exposure to coronavirus). I personally spent 15 minutes on medical discussion.    [name] presents for follow up of opioid use disorder I have reviewed the prior induction visit, follow up visits, and telephone encounters relevant to opiate use disorder (OUD) treatment.   Current daily dose: Suboxone 8-2 mg BID  Date of Induction: 06/10/2018  Current follow up interval, in weeks: 4  The patient has not been adherent with the buprenorphine for OUD contract.   Last UDS Result: Inappropriate for high levels of morphine and fentanyl and metabolite; very low level of buprenorphine and metabolite.   HPI: For the details of today's visit, please refer to the assessment and plan.   Review of Systems  Constitutional:  Negative for chills and fever.  Musculoskeletal:        Left ankle pain, intermittent swelling     Assessment & Plan:   See Encounters Tab for problem based charting.

## 2021-02-08 ENCOUNTER — Other Ambulatory Visit (HOSPITAL_COMMUNITY): Payer: Self-pay

## 2021-02-20 ENCOUNTER — Other Ambulatory Visit (HOSPITAL_COMMUNITY): Payer: Self-pay

## 2021-03-05 ENCOUNTER — Other Ambulatory Visit (HOSPITAL_COMMUNITY): Payer: Self-pay

## 2021-03-19 ENCOUNTER — Other Ambulatory Visit (HOSPITAL_COMMUNITY): Payer: Self-pay

## 2021-03-21 ENCOUNTER — Other Ambulatory Visit (HOSPITAL_COMMUNITY): Payer: Self-pay

## 2021-03-21 ENCOUNTER — Other Ambulatory Visit: Payer: Self-pay | Admitting: Internal Medicine

## 2021-03-21 DIAGNOSIS — F1121 Opioid dependence, in remission: Secondary | ICD-10-CM

## 2021-03-21 MED ORDER — SUBOXONE 8-2 MG SL FILM
1.0000 | ORAL_FILM | Freq: Two times a day (BID) | SUBLINGUAL | 0 refills | Status: DC
  Filled 2021-03-21: qty 60, 30d supply, fill #0

## 2021-03-21 NOTE — Telephone Encounter (Signed)
Refill Request   SUBOXONE 8-2 MG FILM   [facility] (Ph: [phone])

## 2021-03-22 ENCOUNTER — Other Ambulatory Visit (HOSPITAL_COMMUNITY): Payer: Self-pay

## 2021-03-22 ENCOUNTER — Telehealth: Payer: Self-pay

## 2021-03-22 NOTE — Telephone Encounter (Signed)
SUBOXONE 8-2 MG FILM, refill request @ [facility].

## 2021-03-22 NOTE — Telephone Encounter (Signed)
This was requested yesterday and RX was sent to [facility] Encounter closed SChaplin, RN,BSN

## 2021-04-01 ENCOUNTER — Other Ambulatory Visit (HOSPITAL_COMMUNITY): Payer: Self-pay

## 2021-04-22 ENCOUNTER — Other Ambulatory Visit (HOSPITAL_COMMUNITY): Payer: Self-pay

## 2021-04-22 ENCOUNTER — Other Ambulatory Visit: Payer: Self-pay

## 2021-04-22 DIAGNOSIS — F1121 Opioid dependence, in remission: Secondary | ICD-10-CM

## 2021-04-22 MED ORDER — SUBOXONE 8-2 MG SL FILM
1.0000 | ORAL_FILM | Freq: Two times a day (BID) | SUBLINGUAL | 0 refills | Status: DC
  Filled 2021-04-22: qty 60, 30d supply, fill #0

## 2021-04-22 NOTE — Telephone Encounter (Signed)
Patient needs sooner in person follow up for OUD. Resident schedule is fine if OUD clinic is booked. He has been over due for follow up since October. I approved a 1 month refill but no further refills until he is seen. Thanks.

## 2021-04-22 NOTE — Telephone Encounter (Signed)
SUBOXONE 8-2 MG FILM, refill request @ [facility].

## 2021-04-24 ENCOUNTER — Other Ambulatory Visit (HOSPITAL_COMMUNITY): Payer: Self-pay

## 2021-05-16 ENCOUNTER — Other Ambulatory Visit (HOSPITAL_COMMUNITY): Payer: Self-pay

## 2021-05-20 ENCOUNTER — Other Ambulatory Visit (HOSPITAL_COMMUNITY): Payer: Self-pay

## 2021-05-21 ENCOUNTER — Other Ambulatory Visit: Payer: Self-pay

## 2021-05-21 ENCOUNTER — Ambulatory Visit (INDEPENDENT_AMBULATORY_CARE_PROVIDER_SITE_OTHER): Payer: Medicaid Other | Admitting: Internal Medicine

## 2021-05-21 ENCOUNTER — Other Ambulatory Visit (HOSPITAL_COMMUNITY): Payer: Self-pay

## 2021-05-21 VITALS — BP 119/77 | HR 84 | Temp 98.4°F | Wt 165.0 lb

## 2021-05-21 DIAGNOSIS — F1120 Opioid dependence, uncomplicated: Secondary | ICD-10-CM | POA: Diagnosis present

## 2021-05-21 DIAGNOSIS — F1520 Other stimulant dependence, uncomplicated: Secondary | ICD-10-CM

## 2021-05-21 DIAGNOSIS — F1121 Opioid dependence, in remission: Secondary | ICD-10-CM

## 2021-05-21 MED ORDER — SILDENAFIL CITRATE 100 MG PO TABS: 100.0000 mg | ORAL_TABLET | ORAL | 1 refills | Status: DC | PRN

## 2021-05-21 MED ORDER — SUBOXONE 8-2 MG SL FILM
1.0000 | ORAL_FILM | Freq: Two times a day (BID) | SUBLINGUAL | 0 refills | Status: DC
  Filled 2021-05-21: qty 60, 30d supply, fill #0

## 2021-05-21 NOTE — Progress Notes (Signed)
° °  05/24/2021  Vincent Clarke presents for follow up of opioid use disorder I have reviewed the prior induction visit, follow up visits, and telephone encounters relevant to opiate use disorder (OUD) treatment.   Current daily dose: Buprenorphine-naloxone 8-2 film BID  Date of Induction: 05/14/2018  Current follow up interval, in weeks: 1 month     The patient has been partially adherent with the buprenorphine for OUD contract.   Last UDS Result: 12/12/20 inappropriate, Bupe + metabolites but also cocaine and heroin   HPI: This is a 40 old with a history of OUD who presented for follow up of his OUD.  Marland KitchenHe notes that he lost his housing after his landlord changed and was pressing for some back due rent.  This caused some additonal stress, Additionally his ex wife recently passed.  He notes continuing with suboxone use but has used herion on top.  Last use about 3 days ago.     Exam:   Vitals:   05/21/21 1014  BP: 119/77  Pulse: 84  Temp: 98.4 F (36.9 C)  TempSrc: Oral  SpO2: 97%  Weight: 165 lb (74.8 kg)    General: NAD, nl appearance  Cardiovascular: RRR Pulmonary : normal effort, CTAB Abdominal: soft non tender     Assessment/Plan:  See Problem Based Charting in the Encounters Tab     Gust Rung, DO  05/24/2021  3:50 PM

## 2021-05-22 ENCOUNTER — Other Ambulatory Visit (HOSPITAL_COMMUNITY): Payer: Self-pay

## 2021-05-24 NOTE — Assessment & Plan Note (Signed)
Still having fairly frequent heroin use however consistently does have suboxone in his system.  Therefore we have been perusing a harm reduction approach with continue to prescribe suboxone encouraging avoidance of other opioid products.

## 2021-05-25 LAB — TOXASSURE SELECT,+ANTIDEPR,UR

## 2021-06-11 ENCOUNTER — Other Ambulatory Visit (HOSPITAL_COMMUNITY): Payer: Self-pay

## 2021-06-12 ENCOUNTER — Encounter: Payer: Self-pay | Admitting: Internal Medicine

## 2021-06-12 ENCOUNTER — Other Ambulatory Visit (HOSPITAL_COMMUNITY): Payer: Self-pay

## 2021-06-12 ENCOUNTER — Ambulatory Visit (INDEPENDENT_AMBULATORY_CARE_PROVIDER_SITE_OTHER): Payer: Medicaid Other | Admitting: Internal Medicine

## 2021-06-12 DIAGNOSIS — F1120 Opioid dependence, uncomplicated: Secondary | ICD-10-CM | POA: Diagnosis present

## 2021-06-12 DIAGNOSIS — F1121 Opioid dependence, in remission: Secondary | ICD-10-CM

## 2021-06-12 MED ORDER — SUBOXONE 8-2 MG SL FILM
1.0000 | ORAL_FILM | Freq: Two times a day (BID) | SUBLINGUAL | 0 refills | Status: DC
  Filled 2021-06-12 – 2021-06-21 (×2): qty 60, 30d supply, fill #0

## 2021-06-12 NOTE — Assessment & Plan Note (Signed)
Continues to have fairly regular relapses - last relapsed about one week ago. Reports using heroin, but has not injected IV drugs since starting suboxone. We have continued to prescribe as a risk reduction strategy. His Utoxs have all been appropriate for buprenorphine and metabolites. I have sent a 1 month refill, plan for 1 month in person follow up for repeat Utox.

## 2021-06-12 NOTE — Progress Notes (Signed)
° ° °  This is a telephone encounter between [employer] and [employer] on 06/12/2021 for OUD follow up. The visit was conducted with the patient located at home and Reymundo Poll at [facility]. The patient's identity was confirmed using their DOB and current address. The patient has consented to being evaluated through a telephone encounter and understands the associated risks (an examination cannot be done and the patient may need to come in for an appointment) / benefits (allows the patient to remain at home, decreasing exposure to coronavirus). I personally spent 15 minutes on medical discussion.   CC: OUD follow up  HPI:  [name] is a 56 y.o. male with past medical history outlined below. Patient was contacted for a telehealth appointment for OUD follow up. For the details of today's visit, please refer to the assessment and plan.   Assessment & Plan:   Severe opioid use disorder (HCC) Continues to have fairly regular relapses - last relapsed about one week ago. Reports using heroin, but has not injected IV drugs since starting suboxone. We have continued to prescribe as a risk reduction strategy. His Utoxs have all been appropriate for buprenorphine and metabolites. I have sent a 1 month refill, plan for 1 month in person follow up for repeat Utox.

## 2021-06-19 ENCOUNTER — Other Ambulatory Visit (HOSPITAL_COMMUNITY): Payer: Self-pay

## 2021-06-21 ENCOUNTER — Other Ambulatory Visit (HOSPITAL_COMMUNITY): Payer: Self-pay

## 2021-07-08 ENCOUNTER — Other Ambulatory Visit: Payer: Self-pay | Admitting: Gastroenterology

## 2021-07-08 ENCOUNTER — Other Ambulatory Visit: Payer: Self-pay | Admitting: Internal Medicine

## 2021-07-08 ENCOUNTER — Other Ambulatory Visit (HOSPITAL_COMMUNITY): Payer: Self-pay

## 2021-07-08 DIAGNOSIS — F419 Anxiety disorder, unspecified: Secondary | ICD-10-CM

## 2021-07-08 MED ORDER — TRAZODONE HCL 100 MG PO TABS
50.0000 mg | ORAL_TABLET | Freq: Every day | ORAL | 1 refills | Status: DC
  Filled 2021-07-08: qty 90, 90d supply, fill #0
  Filled 2021-10-09: qty 90, 90d supply, fill #1

## 2021-07-08 MED ORDER — PANTOPRAZOLE SODIUM 40 MG PO TBEC
40.0000 mg | DELAYED_RELEASE_TABLET | Freq: Two times a day (BID) | ORAL | 1 refills | Status: DC
  Filled 2021-07-08: qty 60, 30d supply, fill #0
  Filled 2021-08-05: qty 60, 30d supply, fill #1

## 2021-07-09 ENCOUNTER — Other Ambulatory Visit (HOSPITAL_COMMUNITY): Payer: Self-pay

## 2021-07-18 ENCOUNTER — Other Ambulatory Visit (HOSPITAL_COMMUNITY): Payer: Self-pay

## 2021-07-18 ENCOUNTER — Other Ambulatory Visit: Payer: Self-pay

## 2021-07-18 DIAGNOSIS — F1121 Opioid dependence, in remission: Secondary | ICD-10-CM

## 2021-07-18 MED ORDER — SUBOXONE 8-2 MG SL FILM
1.0000 | ORAL_FILM | Freq: Two times a day (BID) | SUBLINGUAL | 0 refills | Status: DC
Start: 2021-07-18 — End: 2021-08-21
  Filled 2021-07-18 – 2021-07-22 (×3): qty 60, 30d supply, fill #0

## 2021-07-19 ENCOUNTER — Other Ambulatory Visit (HOSPITAL_COMMUNITY): Payer: Self-pay

## 2021-07-22 ENCOUNTER — Other Ambulatory Visit (HOSPITAL_COMMUNITY): Payer: Self-pay

## 2021-08-05 ENCOUNTER — Other Ambulatory Visit (HOSPITAL_COMMUNITY): Payer: Self-pay

## 2021-08-06 ENCOUNTER — Ambulatory Visit (INDEPENDENT_AMBULATORY_CARE_PROVIDER_SITE_OTHER): Payer: Medicaid Other | Admitting: Student

## 2021-08-06 VITALS — BP 130/85 | HR 84 | Temp 98.2°F | Wt 166.3 lb

## 2021-08-06 DIAGNOSIS — F1120 Opioid dependence, uncomplicated: Secondary | ICD-10-CM

## 2021-08-06 DIAGNOSIS — F1121 Opioid dependence, in remission: Secondary | ICD-10-CM | POA: Diagnosis present

## 2021-08-06 NOTE — Patient Instructions (Signed)
Mr.Trigo B Len, it was a pleasure seeing you today! ? ?Today we discussed: ?- I have re-filled your prescription. We will see you back in a month. ? ?I have ordered the following labs today: ? ? ?Lab Orders    ?     ToxAssure Select,+Antidepr,UR     ? ?Follow-up:  1 month   ? ?Please make sure to arrive 15 minutes prior to your next appointment. If you arrive late, you may be asked to reschedule.  ? ?We look forward to seeing you next time. Please call our clinic at [phone] if you have any questions or concerns. The best time to call is Monday-Friday from 9am-4pm, but there is someone available 24/7. If after hours or the weekend, call the main hospital number and ask for the Internal Medicine Resident On-Call. If you need medication refills, please notify your pharmacy one week in advance and they will send [place] a request. ? ?Thank you for letting us take part in your care. Wishing you the best! ? ?Thank you, ?Evlyn Kanner, MD ? ?
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
## 2021-08-08 NOTE — Progress Notes (Signed)
Internal Medicine Clinic Attending ? ?Case discussed with Dr. Braswell  At the time of the visit.  We reviewed the resident?s history and exam and pertinent patient test results.  I agree with the assessment, diagnosis, and plan of care documented in the resident?s note.  ?

## 2021-08-10 LAB — TOXASSURE SELECT,+ANTIDEPR,UR

## 2021-08-21 ENCOUNTER — Other Ambulatory Visit (HOSPITAL_COMMUNITY): Payer: Self-pay

## 2021-08-21 ENCOUNTER — Other Ambulatory Visit: Payer: Self-pay

## 2021-08-21 DIAGNOSIS — F1121 Opioid dependence, in remission: Secondary | ICD-10-CM

## 2021-08-21 MED ORDER — SUBOXONE 8-2 MG SL FILM
1.0000 | ORAL_FILM | Freq: Two times a day (BID) | SUBLINGUAL | 0 refills | Status: DC
  Filled 2021-08-21: qty 60, 30d supply, fill #0

## 2021-08-21 NOTE — Telephone Encounter (Signed)
SUBOXONE 8-2 MG FILM, refill request @ [facility]. 

## 2021-08-28 ENCOUNTER — Other Ambulatory Visit: Payer: Self-pay | Admitting: Gastroenterology

## 2021-08-28 ENCOUNTER — Other Ambulatory Visit (HOSPITAL_COMMUNITY): Payer: Self-pay

## 2021-08-28 MED ORDER — PANTOPRAZOLE SODIUM 40 MG PO TBEC
40.0000 mg | DELAYED_RELEASE_TABLET | Freq: Two times a day (BID) | ORAL | 1 refills | Status: DC
  Filled 2021-08-28: qty 60, 30d supply, fill #0
  Filled 2021-09-20: qty 60, 30d supply, fill #1

## 2021-09-10 ENCOUNTER — Telehealth: Payer: Self-pay | Admitting: *Deleted

## 2021-09-10 NOTE — Telephone Encounter (Signed)
Call placed to patient for today's missed appt. Patient is out of town for work. R/s to 5/9 at 0915.  ? ?

## 2021-09-11 ENCOUNTER — Other Ambulatory Visit (HOSPITAL_COMMUNITY): Payer: Self-pay

## 2021-09-16 NOTE — Telephone Encounter (Addendum)
Patient called in requesting to change tomorrow's OUD appt from in-person to [facility]. States he is still on the job out of town and he can't come in cause they have to finish before he can leave. Please advise.  ?

## 2021-09-17 ENCOUNTER — Ambulatory Visit (INDEPENDENT_AMBULATORY_CARE_PROVIDER_SITE_OTHER): Payer: Medicaid Other | Admitting: Student

## 2021-09-17 ENCOUNTER — Other Ambulatory Visit (HOSPITAL_COMMUNITY): Payer: Self-pay

## 2021-09-17 DIAGNOSIS — F1120 Opioid dependence, uncomplicated: Secondary | ICD-10-CM

## 2021-09-17 DIAGNOSIS — F1121 Opioid dependence, in remission: Secondary | ICD-10-CM

## 2021-09-17 MED ORDER — SUBOXONE 8-2 MG SL FILM
1.0000 | ORAL_FILM | Freq: Two times a day (BID) | SUBLINGUAL | 0 refills | Status: DC
  Filled 2021-09-17 – 2021-09-20 (×2): qty 60, 30d supply, fill #0
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
## 2021-09-17 NOTE — Assessment & Plan Note (Addendum)
Unfortunately patient was unable to come to office appointment today.  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 Anticipates he will be back 2 to 3 weeks.  Reports she is doing well on Suboxone 2 films daily.  He reports last use of intranasal heroin to be about 2 to 3 weeks ago..  Otherwise he is not having cravings, withdrawals, issues obtaining his Suboxone.  There is his heroin use is more associated with stressors rather than issues with his Suboxone dosing.  He reports home is stable and is currently living with his wife and kids in [place] when he is not working.  Appears heroin uses since last visit.  Will continue with Suboxone for harm reduction.We discussed that he needs to be seen in person for his Suboxone.  He believes he will be back in 4 weeks and will be able to have an in person visit. ? ?Suboxone 8-2 mg twice daily ?Follow-up in 4 weeks in person, repeat UDS at that time. ?

## 2021-09-18 ENCOUNTER — Other Ambulatory Visit (HOSPITAL_COMMUNITY): Payer: Self-pay

## 2021-09-18 NOTE — Progress Notes (Signed)
Internal Medicine Clinic Attending ? ?Case discussed with Dr. Lisabeth Devoid  At the time of the visit.  We reviewed the resident?s history and exam and pertinent patient test results.  I agree with the assessment, diagnosis, and plan of care documented in the resident?s note.  ? ?He had lots of stressors at last visit, this seems to be doing better. ?He is forthcoming with his struggles with intranasal heroin use, but is working hard to stop this. Last use 2-3 weeks ago. ?Will refill Suboxone 8-2 BID for 30 days, to fill 30d after his last fill ?He needs to come in-person for next appointment for Utox. This appointment was supposed to be in-person, so would like UDS at next one. ?

## 2021-09-20 ENCOUNTER — Other Ambulatory Visit (HOSPITAL_COMMUNITY): Payer: Self-pay

## 2021-10-09 ENCOUNTER — Other Ambulatory Visit (HOSPITAL_COMMUNITY): Payer: Self-pay

## 2021-10-16 ENCOUNTER — Other Ambulatory Visit: Payer: Self-pay | Admitting: Gastroenterology

## 2021-10-16 ENCOUNTER — Other Ambulatory Visit (HOSPITAL_COMMUNITY): Payer: Self-pay

## 2021-10-18 ENCOUNTER — Other Ambulatory Visit (HOSPITAL_COMMUNITY): Payer: Self-pay

## 2021-10-18 ENCOUNTER — Other Ambulatory Visit: Payer: Self-pay

## 2021-10-18 DIAGNOSIS — F1121 Opioid dependence, in remission: Secondary | ICD-10-CM

## 2021-10-18 MED ORDER — SUBOXONE 8-2 MG SL FILM
1.0000 | ORAL_FILM | Freq: Two times a day (BID) | SUBLINGUAL | 0 refills | Status: DC
Start: 2021-10-18 — End: 2021-11-05
  Filled 2021-10-18 – 2021-10-21 (×2): qty 60, 30d supply, fill #0

## 2021-10-18 MED ORDER — PANTOPRAZOLE SODIUM 40 MG PO TBEC
40.0000 mg | DELAYED_RELEASE_TABLET | Freq: Two times a day (BID) | ORAL | 1 refills | Status: DC
  Filled 2021-10-18: qty 60, 30d supply, fill #0
  Filled 2021-11-19: qty 60, 30d supply, fill #1

## 2021-10-18 NOTE — Telephone Encounter (Signed)
Next appt scheduled 6/27 in OUD Clinic.

## 2021-10-18 NOTE — Telephone Encounter (Signed)
SUBOXONE 8-2 MG FILM, REFILL REQUEST @ [facility]. 

## 2021-10-21 ENCOUNTER — Other Ambulatory Visit (HOSPITAL_COMMUNITY): Payer: Self-pay

## 2021-10-21 ENCOUNTER — Telehealth: Payer: Medicaid Other | Admitting: Internal Medicine

## 2021-10-22 ENCOUNTER — Other Ambulatory Visit (HOSPITAL_COMMUNITY): Payer: Self-pay

## 2021-11-05 ENCOUNTER — Other Ambulatory Visit: Payer: Self-pay

## 2021-11-05 ENCOUNTER — Ambulatory Visit (INDEPENDENT_AMBULATORY_CARE_PROVIDER_SITE_OTHER): Payer: Medicaid Other | Admitting: Student in an Organized Health Care Education/Training Program

## 2021-11-05 ENCOUNTER — Other Ambulatory Visit (HOSPITAL_COMMUNITY): Payer: Self-pay

## 2021-11-05 VITALS — BP 126/69 | HR 66 | Temp 98.2°F

## 2021-11-05 DIAGNOSIS — F1120 Opioid dependence, uncomplicated: Secondary | ICD-10-CM

## 2021-11-05 DIAGNOSIS — F17210 Nicotine dependence, cigarettes, uncomplicated: Secondary | ICD-10-CM

## 2021-11-05 DIAGNOSIS — F1121 Opioid dependence, in remission: Secondary | ICD-10-CM | POA: Diagnosis not present

## 2021-11-05 DIAGNOSIS — I10 Essential (primary) hypertension: Secondary | ICD-10-CM | POA: Diagnosis present

## 2021-11-05 DIAGNOSIS — F17200 Nicotine dependence, unspecified, uncomplicated: Secondary | ICD-10-CM

## 2021-11-05 DIAGNOSIS — R42 Dizziness and giddiness: Secondary | ICD-10-CM

## 2021-11-05 MED ORDER — SUBOXONE 8-2 MG SL FILM
1.0000 | ORAL_FILM | Freq: Two times a day (BID) | SUBLINGUAL | 1 refills | Status: DC
  Filled 2021-11-05 – 2021-11-20 (×2): qty 60, 30d supply, fill #0
  Filled 2021-12-20: qty 60, 30d supply, fill #1

## 2021-11-05 NOTE — Assessment & Plan Note (Signed)
Doing well on current dose of suboxone 8mg  BID. Will continue that medicine, I refilled 2 month supply. I reviewed the database which was appropriate. Last toxassure was fine for opioids.

## 2021-11-06 LAB — CMP14 + ANION GAP
ALT: 13 IU/L (ref 0–44)
AST: 17 IU/L (ref 0–40)
Albumin/Globulin Ratio: 1.6 (ref 1.2–2.2)
Albumin: 4.3 g/dL (ref 3.8–4.9)
Alkaline Phosphatase: 107 IU/L (ref 44–121)
Anion Gap: 15 mmol/L (ref 10.0–18.0)
BUN/Creatinine Ratio: 12 (ref 9–20)
BUN: 15 mg/dL (ref 6–24)
Bilirubin Total: 0.4 mg/dL (ref 0.0–1.2)
CO2: 26 mmol/L (ref 20–29)
Calcium: 9.6 mg/dL (ref 8.7–10.2)
Chloride: 96 mmol/L (ref 96–106)
Creatinine, Ser: 1.23 mg/dL (ref 0.76–1.27)
Globulin, Total: 2.7 g/dL (ref 1.5–4.5)
Glucose: 101 mg/dL — ABNORMAL HIGH (ref 70–99)
Potassium: 3.2 mmol/L — ABNORMAL LOW (ref 3.5–5.2)
Sodium: 137 mmol/L (ref 134–144)
Total Protein: 7 g/dL (ref 6.0–8.5)
eGFR: 68 mL/min/{1.73_m2} (ref >59)

## 2021-11-06 LAB — CBC
Hematocrit: 35.7 % — ABNORMAL LOW (ref 37.5–51.0)
Hemoglobin: 12.5 g/dL — ABNORMAL LOW (ref 13.0–17.7)
MCH: 31.3 pg (ref 26.6–33.0)
MCHC: 35 g/dL (ref 31.5–35.7)
MCV: 89 fL (ref 79–97)
Platelets: 388 10*3/uL (ref 150–450)
RBC: 4 x10E6/uL — ABNORMAL LOW (ref 4.14–5.80)
RDW: 12.4 % (ref 11.6–15.4)
WBC: 9 10*3/uL (ref 3.4–10.8)

## 2021-11-07 ENCOUNTER — Other Ambulatory Visit (HOSPITAL_COMMUNITY): Payer: Self-pay

## 2021-11-07 MED ORDER — LISINOPRIL-HYDROCHLOROTHIAZIDE 10-12.5 MG PO TABS
1.0000 | ORAL_TABLET | Freq: Every day | ORAL | 3 refills | Status: DC
  Filled 2021-11-07: qty 90, 90d supply, fill #0
  Filled 2022-01-30: qty 90, 90d supply, fill #1
  Filled 2022-05-06: qty 90, 90d supply, fill #2
  Filled 2022-07-29: qty 90, 90d supply, fill #3

## 2021-11-07 NOTE — Addendum Note (Signed)
Addended by: Erlinda Hong T on: 11/07/2021 08:57 AM   Modules accepted: Orders

## 2021-11-07 NOTE — Assessment & Plan Note (Signed)
Patient with hypertension on combination lisinopril and HCTZ 20-25mg  daily. His BP is in a good range in the office. He is having dizziness with standing on a near daily basis. Orthostatics in the office were ok. Blood work also ok with no anemia or electrolyte issues. I think BP med may be too much for him with recent hot weather and he works a physical job. Will decrease lisinopril-HCTZ to 10-12.5mg  daily and follow up BP and dizziness at next visit.

## 2021-11-19 ENCOUNTER — Other Ambulatory Visit (HOSPITAL_COMMUNITY): Payer: Self-pay

## 2021-11-20 ENCOUNTER — Telehealth: Payer: Self-pay | Admitting: Internal Medicine

## 2021-11-20 ENCOUNTER — Other Ambulatory Visit (HOSPITAL_COMMUNITY): Payer: Self-pay

## 2021-11-20 NOTE — Telephone Encounter (Signed)
Refill Request   SUBOXONE 8-2 MG FILM  [facility]

## 2021-11-20 NOTE — Telephone Encounter (Signed)
#  60 with 1 sent to [facility] on 6/27. Attempted to notify patient to contact pharmacy for refill. Call cannot be completed at this time. Spoke with Helmut Muster at [facility] who states refill is ready for p/u. She will remind patient to contact them first as he still has 2 refills left.

## 2021-11-21 ENCOUNTER — Other Ambulatory Visit (HOSPITAL_COMMUNITY): Payer: Self-pay

## 2021-12-12 ENCOUNTER — Other Ambulatory Visit (HOSPITAL_COMMUNITY): Payer: Self-pay

## 2021-12-12 ENCOUNTER — Other Ambulatory Visit: Payer: Self-pay | Admitting: Internal Medicine

## 2021-12-13 ENCOUNTER — Other Ambulatory Visit (HOSPITAL_COMMUNITY): Payer: Self-pay

## 2021-12-13 MED ORDER — PANTOPRAZOLE SODIUM 40 MG PO TBEC
40.0000 mg | DELAYED_RELEASE_TABLET | Freq: Two times a day (BID) | ORAL | 5 refills | Status: DC
  Filled 2021-12-13: qty 60, 30d supply, fill #0
  Filled 2022-01-09: qty 60, 30d supply, fill #1
  Filled 2022-02-06: qty 60, 30d supply, fill #2
  Filled 2022-03-06: qty 60, 30d supply, fill #3
  Filled 2022-04-04: qty 60, 30d supply, fill #4
  Filled 2022-05-06 (×2): qty 60, 30d supply, fill #5

## 2021-12-20 ENCOUNTER — Other Ambulatory Visit (HOSPITAL_COMMUNITY): Payer: Self-pay

## 2022-01-07 ENCOUNTER — Other Ambulatory Visit (HOSPITAL_COMMUNITY): Payer: Self-pay

## 2022-01-07 ENCOUNTER — Other Ambulatory Visit: Payer: Self-pay | Admitting: Internal Medicine

## 2022-01-07 DIAGNOSIS — F5105 Insomnia due to other mental disorder: Secondary | ICD-10-CM

## 2022-01-07 DIAGNOSIS — F419 Anxiety disorder, unspecified: Secondary | ICD-10-CM

## 2022-01-08 ENCOUNTER — Encounter: Payer: Self-pay | Admitting: Internal Medicine

## 2022-01-08 ENCOUNTER — Other Ambulatory Visit (HOSPITAL_COMMUNITY): Payer: Self-pay

## 2022-01-08 ENCOUNTER — Encounter: Payer: Medicaid Other | Admitting: Internal Medicine

## 2022-01-08 MED ORDER — TRAZODONE HCL 100 MG PO TABS
50.0000 mg | ORAL_TABLET | Freq: Every day | ORAL | 0 refills | Status: DC
  Filled 2022-01-08: qty 90, 90d supply, fill #0

## 2022-01-08 NOTE — Telephone Encounter (Signed)
Approved trazodone. Patient missed appointment with me today, please have him reschedule. Thanks.

## 2022-01-09 ENCOUNTER — Telehealth: Payer: Self-pay

## 2022-01-09 ENCOUNTER — Other Ambulatory Visit (HOSPITAL_COMMUNITY): Payer: Self-pay

## 2022-01-09 NOTE — Telephone Encounter (Signed)
Returned call to patient. Scheduled for tele appt tomorrow at 0915. Also, confirmed OV with PCP for 10/11 at 0945. He understands importance of keeping these visits.

## 2022-01-09 NOTE — Telephone Encounter (Signed)
Telehealth is fine, thank you!

## 2022-01-09 NOTE — Telephone Encounter (Signed)
Requesting to speak with a nurse about getting telehealth appt for OUD. Please call pt back.

## 2022-01-10 ENCOUNTER — Ambulatory Visit (INDEPENDENT_AMBULATORY_CARE_PROVIDER_SITE_OTHER): Payer: Medicaid Other | Admitting: Internal Medicine

## 2022-01-10 ENCOUNTER — Other Ambulatory Visit (HOSPITAL_COMMUNITY): Payer: Self-pay

## 2022-01-10 DIAGNOSIS — F1121 Opioid dependence, in remission: Secondary | ICD-10-CM | POA: Diagnosis not present

## 2022-01-10 DIAGNOSIS — F1120 Opioid dependence, uncomplicated: Secondary | ICD-10-CM

## 2022-01-10 MED ORDER — SUBOXONE 8-2 MG SL FILM
1.0000 | ORAL_FILM | Freq: Two times a day (BID) | SUBLINGUAL | 0 refills | Status: DC
  Filled 2022-01-20: qty 60, 30d supply, fill #0
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
## 2022-01-10 NOTE — Assessment & Plan Note (Signed)
Mr. Kittelson presents for follow up of opioid use disorder I have reviewed the prior induction visit, follow up visits, and telephone encounters relevant to opiate use disorder (OUD) treatment. He was using nasal heroin, but has not used in several months. He is staying busy working and was out of town this week for work leading to missing his appointment in person 8/31. He denies cravings and constipation. Toxassure 3/23 showed buprenorphine   Current daily dose: Suboxone 8-2 mg 1 tablet every morning and 1 tablet in the evening, 60 tablets   Date of Induction: 2020 with our clinic   Current follow up interval, in weeks: 4 weeks with PCP 02/19/22   The patient has been adherent with the buprenorphine for OUD contract. P: Suboxone 60 tabs refill sent to [facility]. F/u 10/11, will need toxassure at that time.

## 2022-01-14 NOTE — Progress Notes (Signed)
Internal Medicine Clinic Attending ? ?Case discussed with Dr. Masters  At the time of the visit.  We reviewed the resident?s history and pertinent patient test results.  I agree with the assessment, diagnosis, and plan of care documented in the resident?s note.  ?

## 2022-01-20 ENCOUNTER — Other Ambulatory Visit (HOSPITAL_COMMUNITY): Payer: Self-pay

## 2022-01-30 ENCOUNTER — Other Ambulatory Visit (HOSPITAL_COMMUNITY): Payer: Self-pay

## 2022-02-06 ENCOUNTER — Other Ambulatory Visit (HOSPITAL_COMMUNITY): Payer: Self-pay

## 2022-02-19 ENCOUNTER — Other Ambulatory Visit: Payer: Self-pay

## 2022-02-19 ENCOUNTER — Ambulatory Visit (INDEPENDENT_AMBULATORY_CARE_PROVIDER_SITE_OTHER): Payer: Medicaid Other | Admitting: Internal Medicine

## 2022-02-19 ENCOUNTER — Encounter: Payer: Self-pay | Admitting: Internal Medicine

## 2022-02-19 ENCOUNTER — Other Ambulatory Visit (HOSPITAL_COMMUNITY): Payer: Self-pay

## 2022-02-19 VITALS — BP 109/58 | HR 71 | Temp 98.1°F | Ht 65.0 in | Wt 160.7 lb

## 2022-02-19 DIAGNOSIS — F1121 Opioid dependence, in remission: Secondary | ICD-10-CM | POA: Diagnosis not present

## 2022-02-19 DIAGNOSIS — I10 Essential (primary) hypertension: Secondary | ICD-10-CM

## 2022-02-19 DIAGNOSIS — F1120 Opioid dependence, uncomplicated: Secondary | ICD-10-CM

## 2022-02-19 DIAGNOSIS — F419 Anxiety disorder, unspecified: Secondary | ICD-10-CM

## 2022-02-19 DIAGNOSIS — N529 Male erectile dysfunction, unspecified: Secondary | ICD-10-CM | POA: Diagnosis not present

## 2022-02-19 DIAGNOSIS — F5105 Insomnia due to other mental disorder: Secondary | ICD-10-CM

## 2022-02-19 DIAGNOSIS — F17210 Nicotine dependence, cigarettes, uncomplicated: Secondary | ICD-10-CM

## 2022-02-19 MED ORDER — SUBOXONE 8-2 MG SL FILM
1.0000 | ORAL_FILM | Freq: Two times a day (BID) | SUBLINGUAL | 1 refills | Status: DC
  Filled 2022-02-19: qty 60, 30d supply, fill #0
  Filled 2022-03-21: qty 60, 30d supply, fill #1

## 2022-02-19 MED ORDER — SILDENAFIL CITRATE 100 MG PO TABS
100.0000 mg | ORAL_TABLET | ORAL | 1 refills | Status: DC | PRN
  Filled 2022-02-19: qty 10, 30d supply, fill #0
  Filled 2022-03-05: qty 30, 30d supply, fill #0
  Filled 2022-03-05: qty 10, 30d supply, fill #0
  Filled 2022-07-24: qty 10, 30d supply, fill #1
  Filled 2022-12-09: qty 10, 30d supply, fill #2
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
## 2022-02-19 NOTE — Patient Instructions (Addendum)
Mr. Beedle,  It was a pleasure to see you today. Please continue to take all your medicaitons as previously prescribed. If your blood work is abnormal, I will give you a call. Otherwise please follow up with me again in 3 months.   If you have any questions or concerns, call our clinic at [phone] or after hours call [phone] and ask for the internal medicine resident on call.   Thank you!  Dr. Darnell Level

## 2022-02-19 NOTE — Assessment & Plan Note (Signed)
In remission, continues to do well on Suboxone 8-2 mg films BID.  Denies recent relapse.  Cravings are relatively well controlled.  He keeps himself busy and travels frequently with his job.  I reviewed PDMP and refill history is appropriate.  Checking U tox today.  Sent new Rx of his Suboxone with 1 refill, plan for 24-month follow-up.

## 2022-02-19 NOTE — Assessment & Plan Note (Signed)
Takes trazodone 100 mg nightly as needed.  Doing well on this, does not need refills currently.

## 2022-02-19 NOTE — Assessment & Plan Note (Signed)
Well-controlled on lisinopril-hydrochlorothiazide 10-12.5 mg daily.  Last BMP checked with mild hypokalemia.  We are rechecking BMP today.  We will follow this up.

## 2022-02-19 NOTE — Assessment & Plan Note (Signed)
Refilled sildenafil 100 mg PRN.

## 2022-02-20 ENCOUNTER — Other Ambulatory Visit (HOSPITAL_COMMUNITY): Payer: Self-pay

## 2022-02-20 LAB — BMP8+ANION GAP
Anion Gap: 19 mmol/L — ABNORMAL HIGH (ref 10.0–18.0)
BUN/Creatinine Ratio: 12 (ref 9–20)
BUN: 16 mg/dL (ref 6–24)
CO2: 19 mmol/L — ABNORMAL LOW (ref 20–29)
Calcium: 9.5 mg/dL (ref 8.7–10.2)
Chloride: 103 mmol/L (ref 96–106)
Creatinine, Ser: 1.35 mg/dL — ABNORMAL HIGH (ref 0.76–1.27)
Glucose: 122 mg/dL — ABNORMAL HIGH (ref 70–99)
Potassium: 4.3 mmol/L (ref 3.5–5.2)
Sodium: 141 mmol/L (ref 134–144)
eGFR: 61 mL/min/{1.73_m2} (ref >59)

## 2022-02-22 LAB — TOXASSURE SELECT,+ANTIDEPR,UR

## 2022-02-25 ENCOUNTER — Other Ambulatory Visit (HOSPITAL_COMMUNITY): Payer: Self-pay

## 2022-02-27 ENCOUNTER — Other Ambulatory Visit (HOSPITAL_COMMUNITY): Payer: Self-pay

## 2022-03-03 ENCOUNTER — Other Ambulatory Visit (HOSPITAL_COMMUNITY): Payer: Self-pay

## 2022-03-05 ENCOUNTER — Other Ambulatory Visit (HOSPITAL_COMMUNITY): Payer: Self-pay

## 2022-03-06 ENCOUNTER — Other Ambulatory Visit (HOSPITAL_COMMUNITY): Payer: Self-pay

## 2022-03-17 ENCOUNTER — Other Ambulatory Visit: Payer: Self-pay | Admitting: Internal Medicine

## 2022-03-17 ENCOUNTER — Other Ambulatory Visit (HOSPITAL_COMMUNITY): Payer: Self-pay

## 2022-03-17 DIAGNOSIS — F419 Anxiety disorder, unspecified: Secondary | ICD-10-CM

## 2022-03-18 ENCOUNTER — Other Ambulatory Visit (HOSPITAL_COMMUNITY): Payer: Self-pay

## 2022-03-18 MED ORDER — TRAZODONE HCL 100 MG PO TABS
100.0000 mg | ORAL_TABLET | Freq: Every day | ORAL | 3 refills | Status: DC
  Filled 2022-03-18 – 2022-07-24 (×2): qty 90, 90d supply, fill #0
  Filled 2022-09-25 – 2022-09-30 (×2): qty 90, 90d supply, fill #1
  Filled 2022-11-19 – 2022-12-22 (×2): qty 90, 90d supply, fill #2

## 2022-03-21 ENCOUNTER — Other Ambulatory Visit (HOSPITAL_COMMUNITY): Payer: Self-pay

## 2022-04-04 ENCOUNTER — Other Ambulatory Visit (HOSPITAL_COMMUNITY): Payer: Self-pay

## 2022-04-21 ENCOUNTER — Other Ambulatory Visit: Payer: Self-pay | Admitting: Internal Medicine

## 2022-04-21 ENCOUNTER — Other Ambulatory Visit (HOSPITAL_COMMUNITY): Payer: Self-pay

## 2022-04-21 DIAGNOSIS — F1121 Opioid dependence, in remission: Secondary | ICD-10-CM

## 2022-04-22 ENCOUNTER — Other Ambulatory Visit (HOSPITAL_COMMUNITY): Payer: Self-pay

## 2022-04-22 MED ORDER — SUBOXONE 8-2 MG SL FILM
1.0000 | ORAL_FILM | Freq: Two times a day (BID) | SUBLINGUAL | 0 refills | Status: DC
  Filled 2022-04-22: qty 60, 30d supply, fill #0

## 2022-04-22 NOTE — Telephone Encounter (Signed)
Refill request on SUBOXONE 8-2 MG FILM @ [place] [facility]

## 2022-05-06 ENCOUNTER — Other Ambulatory Visit (HOSPITAL_COMMUNITY): Payer: Self-pay

## 2022-05-06 ENCOUNTER — Other Ambulatory Visit: Payer: Self-pay

## 2022-05-21 ENCOUNTER — Ambulatory Visit (INDEPENDENT_AMBULATORY_CARE_PROVIDER_SITE_OTHER): Payer: Medicaid Other | Admitting: Internal Medicine

## 2022-05-21 ENCOUNTER — Other Ambulatory Visit (HOSPITAL_COMMUNITY): Payer: Self-pay

## 2022-05-21 DIAGNOSIS — F1121 Opioid dependence, in remission: Secondary | ICD-10-CM

## 2022-05-21 DIAGNOSIS — F1120 Opioid dependence, uncomplicated: Secondary | ICD-10-CM

## 2022-05-21 MED ORDER — SUBOXONE 8-2 MG SL FILM
1.0000 | ORAL_FILM | Freq: Two times a day (BID) | SUBLINGUAL | 1 refills | Status: DC
  Filled 2022-05-21: qty 60, 30d supply, fill #0
  Filled 2022-06-23: qty 60, 30d supply, fill #1

## 2022-05-21 NOTE — Assessment & Plan Note (Signed)
Continues to have regular relapses, however has abstained from IV injection use.  He still feels that the Suboxone is helping significantly and would like to continue it.  I have sent a refill of his Suboxone and asked that he reschedule his in person follow-up sometime in the next 6-8 weeks.

## 2022-05-21 NOTE — Progress Notes (Signed)
    This is a telephone encounter between [employer] and Vincent Clarke on 05/21/2022 for OUD follow up. The visit was conducted with the patient located at home and Vincent Clarke at [facility]. The patient's identity was confirmed using their DOB and current address. The patient has consented to being evaluated through a telephone encounter and understands the associated risks (an examination cannot be done and the patient may need to come in for an appointment) / benefits (allows the patient to remain at home, decreasing exposure to coronavirus). I personally spent 12 minutes on medical discussion.   CC: OUD follow up  HPI:  [name] is a 57 y.o. male with past medical history outlined below. Patient was contacted for a telehealth appointment for OUD follow up.   This was initially schedule to be an in person appointment unfortunately his car broke down and he was unable to make it. We reviewed his last Utox together, which was appropriate for buprenorphine and metabolite however was also positive for morphine and fentanyl.  He reports multiple stressors, mainly financial.  Lately has been having difficulty finding work.  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  He is still relapsing regularly, reports snorting heroin but does not inject needles.  At this point we have continued Suboxone as a risk reduction strategy, he has been unable to achieve remission.   Assessment & Plan:   Severe opioid use disorder ([place]) Continues to have regular relapses, however has abstained from IV injection use.  He still feels that the Suboxone is helping significantly and would like to continue it.  I have sent a refill of his Suboxone and asked that he reschedule his in person follow-up sometime in the next 6-8 weeks.

## 2022-06-05 ENCOUNTER — Other Ambulatory Visit (HOSPITAL_COMMUNITY): Payer: Self-pay

## 2022-06-05 ENCOUNTER — Other Ambulatory Visit: Payer: Self-pay | Admitting: Internal Medicine

## 2022-06-05 DIAGNOSIS — K222 Esophageal obstruction: Secondary | ICD-10-CM

## 2022-06-06 ENCOUNTER — Other Ambulatory Visit (HOSPITAL_COMMUNITY): Payer: Self-pay

## 2022-06-06 MED ORDER — PANTOPRAZOLE SODIUM 40 MG PO TBEC
40.0000 mg | DELAYED_RELEASE_TABLET | Freq: Two times a day (BID) | ORAL | 5 refills | Status: DC
  Filled 2022-06-06: qty 60, 30d supply, fill #0
  Filled 2022-07-02: qty 60, 30d supply, fill #1
  Filled 2022-07-29: qty 60, 30d supply, fill #2
  Filled 2022-08-26: qty 60, 30d supply, fill #3
  Filled 2022-09-25: qty 60, 30d supply, fill #4
  Filled 2022-10-23 (×2): qty 60, 30d supply, fill #5

## 2022-06-23 ENCOUNTER — Other Ambulatory Visit (HOSPITAL_COMMUNITY): Payer: Self-pay

## 2022-07-02 ENCOUNTER — Other Ambulatory Visit (HOSPITAL_COMMUNITY): Payer: Self-pay

## 2022-07-03 ENCOUNTER — Other Ambulatory Visit (HOSPITAL_COMMUNITY): Payer: Self-pay

## 2022-07-18 ENCOUNTER — Other Ambulatory Visit (HOSPITAL_COMMUNITY): Payer: Self-pay

## 2022-07-18 ENCOUNTER — Other Ambulatory Visit: Payer: Self-pay | Admitting: Internal Medicine

## 2022-07-18 DIAGNOSIS — F1121 Opioid dependence, in remission: Secondary | ICD-10-CM

## 2022-07-21 ENCOUNTER — Other Ambulatory Visit (HOSPITAL_COMMUNITY): Payer: Self-pay

## 2022-07-21 MED ORDER — SUBOXONE 8-2 MG SL FILM
1.0000 | ORAL_FILM | Freq: Two times a day (BID) | SUBLINGUAL | 0 refills | Status: DC
  Filled 2022-07-21: qty 60, 30d supply, fill #0

## 2022-07-21 NOTE — Telephone Encounter (Signed)
Patient needs in person follow up for future refills. Thanks.

## 2022-07-24 ENCOUNTER — Other Ambulatory Visit (HOSPITAL_COMMUNITY): Payer: Self-pay

## 2022-07-29 ENCOUNTER — Other Ambulatory Visit (HOSPITAL_COMMUNITY): Payer: Self-pay

## 2022-08-21 ENCOUNTER — Other Ambulatory Visit (HOSPITAL_COMMUNITY): Payer: Self-pay

## 2022-08-21 ENCOUNTER — Other Ambulatory Visit: Payer: Self-pay | Admitting: Internal Medicine

## 2022-08-21 DIAGNOSIS — F1121 Opioid dependence, in remission: Secondary | ICD-10-CM

## 2022-08-22 ENCOUNTER — Other Ambulatory Visit (HOSPITAL_COMMUNITY): Payer: Self-pay

## 2022-08-22 MED ORDER — SUBOXONE 8-2 MG SL FILM
1.0000 | ORAL_FILM | Freq: Two times a day (BID) | SUBLINGUAL | 0 refills | Status: DC
  Filled 2022-08-22: qty 60, 30d supply, fill #0

## 2022-08-22 NOTE — Telephone Encounter (Signed)
Patient needs to keep his next follow up appointment for further refills. Thanks.

## 2022-08-22 NOTE — Telephone Encounter (Signed)
Pt called - informed rx was just refilled and to keep his appt on 4/22;stated he will.

## 2022-08-26 ENCOUNTER — Other Ambulatory Visit (HOSPITAL_COMMUNITY): Payer: Self-pay

## 2022-09-01 ENCOUNTER — Encounter: Payer: Self-pay | Admitting: Student

## 2022-09-01 ENCOUNTER — Ambulatory Visit (INDEPENDENT_AMBULATORY_CARE_PROVIDER_SITE_OTHER): Payer: Medicaid Other | Admitting: Student

## 2022-09-01 VITALS — BP 123/60 | HR 74 | Temp 98.1°F | Ht 65.0 in | Wt 163.6 lb

## 2022-09-01 DIAGNOSIS — F1120 Opioid dependence, uncomplicated: Secondary | ICD-10-CM

## 2022-09-01 DIAGNOSIS — Z79891 Long term (current) use of opiate analgesic: Secondary | ICD-10-CM

## 2022-09-01 DIAGNOSIS — I10 Essential (primary) hypertension: Secondary | ICD-10-CM

## 2022-09-01 NOTE — Patient Instructions (Addendum)
Today we discussed the following:  1) Opioid Use Disorder We have increased your Suboxone from two pills daily to two and half pills daily. Please continue this for the next 2 weeks and we will follow up at your next visit.   2) Hypertension Your blood pressure was excellent today at 123/60. Great work! Please continue your lisinopril-HCTZ tablets daily.  Follow up in 2 weeks

## 2022-09-01 NOTE — Assessment & Plan Note (Signed)
BP 123/60 today. Patient is compliant on his lisinopril-HCTZ 10-12.5. He denies any tachycardia, dizziness, lightheadedness, or swelling. Last BMP was in October and revealed elevated BMP at 1.35. Planned to recheck BMP today, however patient had to return to work, so will recheck at follow up visit in 2 weeks. Will continue with current med regimen.  > lisinopril-HCTZ 10-12.5 > Recheck BMP in 2 weeks

## 2022-09-01 NOTE — Assessment & Plan Note (Addendum)
Pt is here for follow up regarding his opioid use disorder. He is currently on Suboxone is 8-2 BID which he is tolerating well. He has struggled with remission over the last several years. Over the past month, he has snorted heroine approximately 3 times. States he usually uses once a week. He denies any IV drug use. 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 He has noticed decreased carving since he started the Suboxone. He reports there was a period of time when he was on Suboxone TID and was able to achieve remission, however there are no records to reflect this.Given that he continues to relapse, will trial him on a two week course of Suboxone 2.5. Will follow up with patient in 2 weeks and decreased back to BID if he does not have improvement.   > Increase Suboxone 8-2 mg to 2.5 times daily and follow up in 2 weeks > Urine tox screen  Addendum 09/05/2022: Patient's tox screen shows multiple unexpected results. Patient informed us that he is only snorting heroin however his urine drug screen shows metabolites of morphine and fentanyl. He has a significant amount of buprenorphine in his urine without the metabolites which likely means he dipped it in his urine. Review of his last urine drug screens also shows minimal amount of buprenorphine metabolites. Patient is likely diverting his buprenorphine. I attempted to discuss results with patient however his mailbox is not set up. He is scheduled for an appointment on May 6th so we will have to discuss this further as this is a breach of his OUD contract.

## 2022-09-01 NOTE — Progress Notes (Signed)
Subjective:   Patient ID: CHANEY MACLAREN male   DOB: 30-Sep-1965 56 y.o.   MRN: [id number]  HPI: Mr.Aaden B Marsala is a 57 y.o. male with a PMHx as presented below who presents for follow up of OUD and HTN. See problem based assessment and plan for further work up and management.   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 (Patient taking differently: Take 1,000 mg by mouth every 6 (six) hours as needed for moderate pain or headache.) 30 tablet 0   lisinopril-hydrochlorothiazide (ZESTORETIC) 10-12.5 MG tablet Take 1 tablet by mouth daily. 90 tablet 3   Multiple Vitamin (MULTIVITAMIN WITH MINERALS) TABS tablet Take 1 tablet by mouth daily. 30 tablet 0   pantoprazole (PROTONIX) 40 MG tablet Take 1 tablet (40 mg total) by mouth 2 (two) times daily before a meal. 60 tablet 5   sildenafil (VIAGRA) 100 MG tablet Take 1 tablet (100 mg total) by mouth as needed for erectile dysfunction. 30 tablet 1   SUBOXONE 8-2 MG FILM Place 1 Film under the tongue 2 (two) times daily. 60 each 0   traZODone (DESYREL) 100 MG tablet Take 1 tablet (100  mg total) by mouth at bedtime. 90 tablet 3   No current facility-administered medications for this visit.     Review of Systems: Pertinent ROS present in the A&P. Otherwise negative.   Objective:   Physical Exam: Vitals:   09/01/22 1511  BP: 123/60  Pulse: 74  Temp: 98.1 F (36.7 C)  TempSrc: Oral  SpO2: 97%  Weight: 163 lb 9.6 oz (74.2 kg)  Height:  (1.651 m)   Physical Exam Constitutional:      Appearance: Normal appearance.  HENT:     Head: Normocephalic and atraumatic.     Mouth/Throat:     Mouth: Mucous membranes are moist.  Cardiovascular:     Rate and Rhythm: Normal rate and regular rhythm.  Pulmonary:     Effort: Pulmonary effort is normal.     Breath sounds: Normal breath sounds.  Skin:    General: Skin is warm and dry.  Neurological:     General: No focal deficit present.     Mental Status: He is alert and oriented to person, place, and time.  Psychiatric:        Mood and Affect: Mood normal.        Behavior: Behavior normal.      Assessment & Plan:   Severe opioid use disorder (HCC) Pt is here for follow up regarding his opioid use disorder. He is currently on Suboxone is 8-2 BID which he is tolerating well. He has struggled  with remission over the last several years. Over the past month, he has snorted heroine approximately 3 times. States he usually uses once a week. He denies any IV drug use. 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 He has noticed decreased carving since he started the Suboxone. He reports there was a period of time when he was on Suboxone TID and was able to achieve remission, however there are no records to reflect this.Given that he continues to relapse, will trial him on a two week course of Suboxone 2.5. Will follow up with patient in 2 weeks and decreased back to BID if he does not have improvement.   > Increase Suboxone 8-2 mg to 2.5 times daily and follow up in  2 weeks > Urine tox screen  HTN (hypertension) BP 123/60 today. Patient is compliant on his lisinopril-HCTZ 10-12.5. He denies any tachycardia, dizziness, lightheadedness, or swelling. Last BMP was in October and revealed elevated BMP at 1.35. Planned to recheck BMP today, however patient had to return to work, so will recheck at follow up visit in 2 weeks. Will continue with current med regimen.  > lisinopril-HCTZ 10-12.5 > Recheck BMP in 2 weeks

## 2022-09-02 NOTE — Progress Notes (Signed)
Internal Medicine Clinic Attending  Case discussed with Dr. Amponsah  At the time of the visit.  We reviewed the resident's history and exam and pertinent patient test results.  I agree with the assessment, diagnosis, and plan of care documented in the resident's note.  

## 2022-09-04 LAB — TOXASSURE SELECT,+ANTIDEPR,UR

## 2022-09-05 NOTE — Progress Notes (Signed)
Patient's tox screen shows multiple unexpected results. Patient informed us that he is only snorting heroin however his urine drug screen shows metabolites of morphine and fentanyl. He has a significant amount of buprenorphine in his urine without the metabolites which likely means he dipped it in his urine. Review of his last urine drug screens also shows minimal amount of buprenorphine metabolites.  Patient is likely diverting his buprenorphine. I attempted to discuss results with patient however his mailbox is not set up. He is scheduled for an appointment on May 6th so we will have to discuss this further as this is a breach of his OUD contract.

## 2022-09-15 ENCOUNTER — Other Ambulatory Visit (HOSPITAL_COMMUNITY): Payer: Self-pay

## 2022-09-15 ENCOUNTER — Ambulatory Visit (INDEPENDENT_AMBULATORY_CARE_PROVIDER_SITE_OTHER): Payer: Medicaid Other | Admitting: Student

## 2022-09-15 DIAGNOSIS — F411 Generalized anxiety disorder: Secondary | ICD-10-CM

## 2022-09-15 DIAGNOSIS — F1121 Opioid dependence, in remission: Secondary | ICD-10-CM

## 2022-09-15 DIAGNOSIS — F1120 Opioid dependence, uncomplicated: Secondary | ICD-10-CM

## 2022-09-15 MED ORDER — ESCITALOPRAM OXALATE 5 MG PO TABS
5.0000 mg | ORAL_TABLET | Freq: Every day | ORAL | 0 refills | Status: AC
  Filled 2022-09-15: qty 30, 30d supply, fill #0

## 2022-09-15 MED ORDER — SUBOXONE 8-2 MG SL FILM
1.0000 | ORAL_FILM | Freq: Two times a day (BID) | SUBLINGUAL | 0 refills | Status: DC
  Filled 2022-09-22: qty 60, 30d supply, fill #0

## 2022-09-15 NOTE — Assessment & Plan Note (Addendum)
Telehealth call with patient regarding his OUD. He is on Suboxone 8-2 mg BID and did not increase dose as discussed at last visit due to concern of running out before next refill. ToxAssure from last visit concerning for diverting buprenorphine given no metabolites of buprenorphine noted on results. Also noted to have metabolites for morphine and fentanyl. He endorses heroin use 2-3 times a month due to stressors. Discussed with patient about breach of contract given findings on his urine drug screen. Patient is agreeable to come for in-person visit in 4 weeks to see his PCP and repeating ToxAssure for further refills of Suboxone.   Plan -will refill Suboxone for now but will need to have in-person f/u and repeat ToxAssure for further refills -will have f/u in-person visit with Dr. Antony Contras in 4 weeks
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
## 2022-09-15 NOTE — Assessment & Plan Note (Signed)
Patient states ongoing anxiety from life and work stressors that makes him more inclined to use heroin. Discussed that addressing his anxiety could help him which he is agreeable to starting a SSRI.   Plan  -Rx sent for escitalopram 5 mg daily, can titrate up at f/u visit in 4 weeks with Dr. Antony Contras

## 2022-09-16 ENCOUNTER — Other Ambulatory Visit (HOSPITAL_COMMUNITY): Payer: Self-pay

## 2022-09-16 NOTE — Progress Notes (Signed)
Internal Medicine Clinic Attending  Case discussed with Dr. Sherrilee Gilles  At the time of the visit.  We reviewed the resident's history and exam and pertinent patient test results.  I agree with the assessment, diagnosis, and plan of care documented in the resident's note.   I spoke with patient over the pone with resident Dr. Sherrilee Gilles. I am patient's PCP and he is well known to me. Unfortunately, he has been unable to achieve remission for OUD despite years of buprenorphine treatment. We have continued to prescribe suboxone in hopes that this would help with risk reduction, unfortunately he has continued to use on a regular weekly basis. His last Utox was concerning for diversion as there was no buprenorphine metabolite present. I discussed this directly with patient. He was tearful over the phone. I explained that we don't seem to be achieving our goals on suboxone and I recommended he consider a high level of care with a methadone clinic. He expressed some safety concerns with methadone however I reassured him that it was safer that what he is currently using off the street. I encouraged him to consider it. I have agreed to refill his suboxone for now, however he must schedule an in person visit with me in 4-6 weeks. I explained that I would like to see a clean UDS with buprenorphine present in his urine if I am going to continue to prescribe. Otherwise, we will discuss getting him established with a methadone clinic at that visit. We are also starting treatment for anxiety as he cites this as the reason for his continued substance use.

## 2022-09-22 ENCOUNTER — Other Ambulatory Visit (HOSPITAL_COMMUNITY): Payer: Self-pay

## 2022-09-25 ENCOUNTER — Other Ambulatory Visit (HOSPITAL_COMMUNITY): Payer: Self-pay

## 2022-09-29 ENCOUNTER — Encounter: Payer: Self-pay | Admitting: Gastroenterology

## 2022-09-30 ENCOUNTER — Encounter: Payer: Self-pay | Admitting: Gastroenterology

## 2022-09-30 ENCOUNTER — Other Ambulatory Visit: Payer: Self-pay

## 2022-10-15 ENCOUNTER — Ambulatory Visit (INDEPENDENT_AMBULATORY_CARE_PROVIDER_SITE_OTHER): Payer: Medicaid Other | Admitting: Internal Medicine

## 2022-10-15 ENCOUNTER — Encounter: Payer: Self-pay | Admitting: Internal Medicine

## 2022-10-15 ENCOUNTER — Telehealth: Payer: Self-pay | Admitting: Internal Medicine

## 2022-10-15 DIAGNOSIS — F1120 Opioid dependence, uncomplicated: Secondary | ICD-10-CM

## 2022-10-15 NOTE — Progress Notes (Signed)
    This is a telephone encounter between [employer] and [employer] on 10/15/2022 for follow up of OUD. The visit was conducted with the patient located at home and Reymundo Poll at [facility]. The patient's identity was confirmed using their DOB and current address. The patient has consented to being evaluated through a telephone encounter and understands the associated risks (an examination cannot be done and the patient may need to come in for an appointment) / benefits (allows the patient to remain at home, decreasing exposure to coronavirus). I personally spent 12 minutes on medical discussion.   CC: OUD  HPI:  Mr.Orlan B Quander is a 57 y.o. male with past medical history outlined below. Patient was contacted for a telehealth appointment for follow up of OUD.     Assessment & Plan:   Severe opioid use disorder [facility]) Please see my visit attestation from his last telehealth encounter on 09/15/22. Unfortunately we have been unable to achieve remission despite a few years of treatment with suboxone. He continues to use fentanyl on a regular basis. We have continued to prescribe in hopes of risk reduction however his last U tox was concerning for urine "dipping" or diversion; showing high levels of buprenorphine without active metabolite present. I confronted him with my concerns during his last telehealth visit. I laid out expectations that he needs to be seen again in person to continue receiving OUD treatment and reviewed our controlled substance contract. He expressed understanding. This appointment was scheduled specifically to follow up on this issue unfortunately he was unable to come in due to a work conflict. I declined to refill his suboxone today as he last filled on 5/13 and should have enough to last him until he can be seen next week. If he is unable to make this next appointment and provide a urine sample, [facility] will no longer be able to continue prescribing his suboxone.

## 2022-10-15 NOTE — Telephone Encounter (Signed)
Patient contacted at the request of Dr. Antony Contras to schedule an appointment for next Thursday when she will be attending.  Spoke to the patient and made him aware.  He was agreeable to an appointment on 10/23/22 at 3:45 pm with Dr. Rudene Christians.

## 2022-10-15 NOTE — Telephone Encounter (Signed)
Thank you :)

## 2022-10-15 NOTE — Assessment & Plan Note (Signed)
Please see my visit attestation from his last telehealth encounter on 09/15/22. Unfortunately we have been unable to achieve remission despite a few years of treatment with suboxone. He continues to use fentanyl on a regular basis. We have continued to prescribe in hopes of risk reduction however his last U tox was concerning for urine "dipping" or diversion; showing high levels of buprenorphine without active metabolite present. I confronted him with my concerns during his last telehealth visit. I laid out expectations that he needs to be seen again in person to continue receiving OUD treatment and reviewed our controlled substance contract. He expressed understanding. This appointment was scheduled specifically to follow up on this issue unfortunately he was unable to come in due to a work conflict. I declined to refill his suboxone today as he last filled on 5/13 and should have enough to last him until he can be seen next week. If he is unable to make this next appointment and provide a urine sample, [facility] will no longer be able to continue prescribing his suboxone.

## 2022-10-23 ENCOUNTER — Ambulatory Visit: Payer: Medicaid Other | Admitting: Internal Medicine

## 2022-10-23 ENCOUNTER — Other Ambulatory Visit (HOSPITAL_COMMUNITY): Payer: Self-pay

## 2022-10-23 ENCOUNTER — Other Ambulatory Visit: Payer: Self-pay | Admitting: Student in an Organized Health Care Education/Training Program

## 2022-10-23 ENCOUNTER — Other Ambulatory Visit: Payer: Self-pay | Admitting: Student

## 2022-10-23 ENCOUNTER — Encounter: Payer: Self-pay | Admitting: Internal Medicine

## 2022-10-23 ENCOUNTER — Other Ambulatory Visit: Payer: Self-pay

## 2022-10-23 VITALS — BP 129/71 | HR 64 | Temp 98.2°F | Ht 66.0 in | Wt 165.5 lb

## 2022-10-23 DIAGNOSIS — I10 Essential (primary) hypertension: Secondary | ICD-10-CM

## 2022-10-23 DIAGNOSIS — F1121 Opioid dependence, in remission: Secondary | ICD-10-CM

## 2022-10-23 DIAGNOSIS — F1120 Opioid dependence, uncomplicated: Secondary | ICD-10-CM

## 2022-10-23 MED ORDER — SUBOXONE 8-2 MG SL FILM
1.0000 | ORAL_FILM | Freq: Two times a day (BID) | SUBLINGUAL | 0 refills | Status: DC
Start: 2022-10-23 — End: 2023-06-11
  Filled 2022-10-23: qty 30, 15d supply, fill #0

## 2022-10-23 MED ORDER — LISINOPRIL-HYDROCHLOROTHIAZIDE 10-12.5 MG PO TABS
1.0000 | ORAL_TABLET | Freq: Every day | ORAL | 3 refills | Status: DC
Start: 2022-10-23 — End: 2023-06-11
  Filled 2022-10-23: qty 90, 90d supply, fill #0
  Filled 2023-01-26: qty 90, 90d supply, fill #1
  Filled 2023-05-12: qty 90, 90d supply, fill #2
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
## 2022-10-23 NOTE — Telephone Encounter (Signed)
Needs to be seen before refill. Thanks.

## 2022-10-23 NOTE — Patient Instructions (Signed)
Thank you, Mr.Sumeet B Bolender for allowing [place] to provide your care today.   Suboxone I sent 15 tablets to [place] out patient pharmacy. Please make appointment in 1-2 weeks for follow-up.  I have ordered the following labs for you:   Lab Orders         ToxAssure Select,+Antidepr,UR       I have ordered the following medication/changed the following medications:   Stop the following medications: Medications Discontinued During This Encounter  Medication Reason   lisinopril-hydrochlorothiazide (ZESTORETIC) 10-12.5 MG tablet Reorder   SUBOXONE 8-2 MG FILM Reorder     Start the following medications: Meds ordered this encounter  Medications   lisinopril-hydrochlorothiazide (ZESTORETIC) 10-12.5 MG tablet    Sig: Take 1 tablet by mouth daily.    Dispense:  90 tablet    Refill:  3    Dose change   SUBOXONE 8-2 MG FILM    Sig: Place 1 Film under the tongue 2 (two) times daily.    Dispense:  30 each    Refill:  0    Fill on 09/21/2022.     Follow up: 1-2 weeks   We look forward to seeing you next time. Please call our clinic at [phone] if you have any questions or concerns. The best time to call is Monday-Friday from 9am-4pm, but there is someone available 24/7. If after hours or the weekend, call the main hospital number and ask for the Internal Medicine Resident On-Call. If you need medication refills, please notify your pharmacy one week in advance and they will send [place] a request.   Thank you for trusting me with your care. Wishing you the best!   Rudene Christians, DO [facility]

## 2022-10-24 NOTE — Assessment & Plan Note (Addendum)
Patient presents for OUD visit. He has had several inappropriate UDS recently with high suspicion for diversion. Today he is feeling anxious and fidgety as last dose of suboxone was 6/12. He missed in person follow-up to have medication refilled. He was able to provide a urine sample today. He snorted heroin about 3 weeks ago but has not used since then. P: Toxassure completed today Refill suboxone 8-2 mg 30 tablets sent for 15 days Follow-up in person within next 1-2 weeks to repeat Toxassure  Addendum: Toxassure inappropriate with heroin, fentanyl and no metabolite for suboxone. I called and let patient know that [facility] will no longer be able to prescribe suboxone. A letter was sent with result and resources for methadone clinic.

## 2022-10-27 NOTE — Progress Notes (Signed)
Internal Medicine Clinic Attending  I saw and evaluated the patient.  I personally confirmed the key portions of the history and exam documented by Dr. Masters and I reviewed pertinent patient test results.  The assessment, diagnosis, and plan were formulated together and I agree with the documentation in the resident's note.  

## 2022-10-29 LAB — TOXASSURE SELECT,+ANTIDEPR,UR

## 2022-10-30 ENCOUNTER — Encounter: Payer: Self-pay | Admitting: Internal Medicine

## 2022-11-19 ENCOUNTER — Other Ambulatory Visit: Payer: Self-pay | Admitting: Internal Medicine

## 2022-11-19 ENCOUNTER — Other Ambulatory Visit (HOSPITAL_COMMUNITY): Payer: Self-pay

## 2022-11-19 DIAGNOSIS — K222 Esophageal obstruction: Secondary | ICD-10-CM

## 2022-11-20 ENCOUNTER — Other Ambulatory Visit (HOSPITAL_COMMUNITY): Payer: Self-pay

## 2022-11-20 MED ORDER — PANTOPRAZOLE SODIUM 40 MG PO TBEC
40.0000 mg | DELAYED_RELEASE_TABLET | Freq: Two times a day (BID) | ORAL | 5 refills | Status: DC
Start: 2022-11-20 — End: 2023-05-15
  Filled 2022-11-20: qty 60, 30d supply, fill #0
  Filled 2022-12-22: qty 60, 30d supply, fill #1
  Filled 2023-01-16: qty 60, 30d supply, fill #2
  Filled 2023-02-17 (×2): qty 60, 30d supply, fill #3
  Filled 2023-03-17: qty 60, 30d supply, fill #4
  Filled 2023-04-14: qty 60, 30d supply, fill #5

## 2022-11-21 ENCOUNTER — Other Ambulatory Visit (HOSPITAL_COMMUNITY): Payer: Self-pay

## 2022-12-09 ENCOUNTER — Other Ambulatory Visit (HOSPITAL_COMMUNITY): Payer: Self-pay

## 2022-12-22 ENCOUNTER — Other Ambulatory Visit (HOSPITAL_COMMUNITY): Payer: Self-pay

## 2023-01-16 ENCOUNTER — Other Ambulatory Visit (HOSPITAL_COMMUNITY): Payer: Self-pay

## 2023-01-26 ENCOUNTER — Other Ambulatory Visit (HOSPITAL_COMMUNITY): Payer: Self-pay

## 2023-02-17 ENCOUNTER — Other Ambulatory Visit (HOSPITAL_COMMUNITY): Payer: Self-pay

## 2023-03-17 ENCOUNTER — Other Ambulatory Visit (HOSPITAL_COMMUNITY): Payer: Self-pay

## 2023-03-24 ENCOUNTER — Other Ambulatory Visit: Payer: Self-pay | Admitting: Internal Medicine

## 2023-03-24 ENCOUNTER — Other Ambulatory Visit (HOSPITAL_COMMUNITY): Payer: Self-pay

## 2023-03-24 DIAGNOSIS — F5105 Insomnia due to other mental disorder: Secondary | ICD-10-CM

## 2023-03-24 DIAGNOSIS — N529 Male erectile dysfunction, unspecified: Secondary | ICD-10-CM

## 2023-03-25 ENCOUNTER — Other Ambulatory Visit (HOSPITAL_COMMUNITY): Payer: Self-pay

## 2023-03-25 NOTE — Telephone Encounter (Signed)
LOV 6/ 13./2024. I called pt to schedule an appt; call transferred to the front office. Appt has been scheduled w/Dr Ned Card 04/15/23.

## 2023-03-26 ENCOUNTER — Other Ambulatory Visit (HOSPITAL_COMMUNITY): Payer: Self-pay

## 2023-03-26 MED ORDER — TRAZODONE HCL 100 MG PO TABS
100.0000 mg | ORAL_TABLET | Freq: Every day | ORAL | 0 refills | Status: DC
Start: 2023-03-26 — End: 2023-05-12
  Filled 2023-03-26: qty 30, 30d supply, fill #0

## 2023-03-26 MED ORDER — SILDENAFIL CITRATE 100 MG PO TABS
100.0000 mg | ORAL_TABLET | ORAL | 1 refills | Status: AC | PRN
Start: 2023-03-26 — End: 2024-05-19
  Filled 2023-03-26: qty 30, 90d supply, fill #0
  Filled 2023-12-29 – 2024-02-09 (×3): qty 30, 90d supply, fill #1

## 2023-04-14 ENCOUNTER — Other Ambulatory Visit (HOSPITAL_COMMUNITY): Payer: Self-pay

## 2023-04-15 ENCOUNTER — Encounter: Payer: Medicaid Other | Admitting: Student

## 2023-04-29 ENCOUNTER — Ambulatory Visit: Payer: Medicaid Other | Admitting: Student

## 2023-04-29 VITALS — BP 101/62 | HR 66 | Temp 98.2°F | Ht 66.0 in | Wt 162.8 lb

## 2023-04-29 DIAGNOSIS — J45909 Unspecified asthma, uncomplicated: Secondary | ICD-10-CM

## 2023-04-29 NOTE — Patient Instructions (Signed)
 SABRA
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
## 2023-05-12 ENCOUNTER — Other Ambulatory Visit: Payer: Self-pay | Admitting: Internal Medicine

## 2023-05-12 ENCOUNTER — Other Ambulatory Visit (HOSPITAL_COMMUNITY): Payer: Self-pay

## 2023-05-12 DIAGNOSIS — K222 Esophageal obstruction: Secondary | ICD-10-CM

## 2023-05-12 DIAGNOSIS — F419 Anxiety disorder, unspecified: Secondary | ICD-10-CM

## 2023-05-14 ENCOUNTER — Other Ambulatory Visit (HOSPITAL_COMMUNITY): Payer: Self-pay

## 2023-05-15 ENCOUNTER — Other Ambulatory Visit (HOSPITAL_COMMUNITY): Payer: Self-pay

## 2023-05-15 MED ORDER — PANTOPRAZOLE SODIUM 40 MG PO TBEC
40.0000 mg | DELAYED_RELEASE_TABLET | Freq: Two times a day (BID) | ORAL | 5 refills | Status: DC
Start: 2023-05-15 — End: 2023-06-11
  Filled 2023-05-15: qty 60, 30d supply, fill #0

## 2023-05-15 MED ORDER — TRAZODONE HCL 100 MG PO TABS
100.0000 mg | ORAL_TABLET | Freq: Every day | ORAL | 0 refills | Status: DC
Start: 2023-05-15 — End: 2023-06-11
  Filled 2023-05-15: qty 30, 30d supply, fill #0

## 2023-05-22 ENCOUNTER — Encounter: Payer: Medicaid Other | Admitting: Internal Medicine

## 2023-06-11 ENCOUNTER — Other Ambulatory Visit: Payer: Self-pay

## 2023-06-11 ENCOUNTER — Other Ambulatory Visit (HOSPITAL_COMMUNITY): Payer: Self-pay

## 2023-06-11 ENCOUNTER — Ambulatory Visit (INDEPENDENT_AMBULATORY_CARE_PROVIDER_SITE_OTHER): Payer: Medicaid Other | Admitting: Student

## 2023-06-11 VITALS — BP 114/82 | HR 76 | Temp 98.2°F | Ht 65.0 in | Wt 165.9 lb

## 2023-06-11 DIAGNOSIS — F419 Anxiety disorder, unspecified: Secondary | ICD-10-CM | POA: Diagnosis not present

## 2023-06-11 DIAGNOSIS — F5105 Insomnia due to other mental disorder: Secondary | ICD-10-CM

## 2023-06-11 DIAGNOSIS — F1190 Opioid use, unspecified, uncomplicated: Secondary | ICD-10-CM | POA: Diagnosis not present

## 2023-06-11 DIAGNOSIS — R5383 Other fatigue: Secondary | ICD-10-CM

## 2023-06-11 DIAGNOSIS — G9331 Postviral fatigue syndrome: Secondary | ICD-10-CM

## 2023-06-11 DIAGNOSIS — F1120 Opioid dependence, uncomplicated: Secondary | ICD-10-CM

## 2023-06-11 DIAGNOSIS — J069 Acute upper respiratory infection, unspecified: Secondary | ICD-10-CM

## 2023-06-11 DIAGNOSIS — K219 Gastro-esophageal reflux disease without esophagitis: Secondary | ICD-10-CM

## 2023-06-11 DIAGNOSIS — K222 Esophageal obstruction: Secondary | ICD-10-CM

## 2023-06-11 DIAGNOSIS — I10 Essential (primary) hypertension: Secondary | ICD-10-CM | POA: Diagnosis present

## 2023-06-11 LAB — POCT GLYCOSYLATED HEMOGLOBIN (HGB A1C): Hemoglobin A1C: 5.7 % — AB (ref 4.0–5.6)

## 2023-06-11 LAB — GLUCOSE, CAPILLARY: Glucose-Capillary: 111 mg/dL — ABNORMAL HIGH (ref 70–99)

## 2023-06-11 MED ORDER — PANTOPRAZOLE SODIUM 40 MG PO TBEC
40.0000 mg | DELAYED_RELEASE_TABLET | Freq: Two times a day (BID) | ORAL | 5 refills | Status: DC
Start: 2023-06-11 — End: 2023-06-13
  Filled 2023-06-11 (×2): qty 60, 30d supply, fill #0

## 2023-06-11 MED ORDER — LISINOPRIL-HYDROCHLOROTHIAZIDE 10-12.5 MG PO TABS
1.0000 | ORAL_TABLET | Freq: Every day | ORAL | 3 refills | Status: DC
Start: 2023-06-11 — End: 2024-02-09
  Filled 2023-06-11 – 2023-08-10 (×2): qty 90, 90d supply, fill #0
  Filled 2023-11-11: qty 90, 90d supply, fill #1
  Filled 2024-02-09 (×3): qty 90, 90d supply, fill #2

## 2023-06-11 MED ORDER — FLUTICASONE PROPIONATE 50 MCG/ACT NA SUSP
1.0000 | Freq: Every day | NASAL | 1 refills | Status: AC
  Filled 2023-06-11: qty 16, 30d supply, fill #0

## 2023-06-11 MED ORDER — TRAZODONE HCL 100 MG PO TABS
100.0000 mg | ORAL_TABLET | Freq: Every day | ORAL | 0 refills | Status: DC
  Filled 2023-06-11: qty 30, 30d supply, fill #0

## 2023-06-11 MED ORDER — CETIRIZINE HCL 10 MG PO TABS
10.0000 mg | ORAL_TABLET | Freq: Every day | ORAL | 2 refills | Status: AC
  Filled 2023-06-11: qty 30, 30d supply, fill #0

## 2023-06-11 NOTE — Patient Instructions (Addendum)
Thank you, [name] for allowing [place] to provide your care today. Today we discussed your fatigue, abdominal symptoms, and healthcare maintenance.    I have ordered the following labs for you:   Lab Orders         CBC with Diff         CMP14 + Anion Gap         Iron, TIBC and Ferritin Panel         POC Hbg A1C      Tests ordered today:  None  Referrals ordered today:   Referral Orders  No referral(s) requested today     I have ordered the following medication/changed the following medications:   Stop the following medications: Medications Discontinued During This Encounter  Medication Reason   SUBOXONE 8-2 MG FILM    lisinopril-hydrochlorothiazide (ZESTORETIC) 10-12.5 MG tablet Reorder   traZODone (DESYREL) 100 MG tablet Reorder   pantoprazole (PROTONIX) 40 MG tablet Reorder     Start the following medications: Meds ordered this encounter  Medications   lisinopril-hydrochlorothiazide (ZESTORETIC) 10-12.5 MG tablet    Sig: Take 1 tablet by mouth daily.    Dispense:  90 tablet    Refill:  3    Dose change   pantoprazole (PROTONIX) 40 MG tablet    Sig: Take 1 tablet (40 mg total) by mouth 2 (two) times daily before a meal.    Dispense:  60 tablet    Refill:  5   traZODone (DESYREL) 100 MG tablet    Sig: Take 1 tablet (100 mg total) by mouth at bedtime.    Dispense:  30 tablet    Refill:  0     Follow up: 6-8 weeks for GERD, OUD, healthcare maintenance follow up    Remember:   - Gastroenterology follow up: Please call [facility] for colonoscopy and follow up on abdominal symptoms.   Dr. Corliss Parish  [facility] [address] [place] [address] [place],  [place]  [address]  Get Driving Directions Main: [phone]  Fax: [phone]  Sunday:Closed Monday:8:00 AM - 5:00 PM Tuesday:8:00 AM - 5:00 PM Wednesday:8:00 AM - 5:00 PM Thursday:8:00 AM - 5:00 PM Friday:8:00 AM - 5:00 PM Saturday:Closed   - For your abdominal  symptoms: I have refilled your pantoprazole, you can also pick up famotidine 20 mg which you can take nightly ($5.98).  - For your upper respiratory symptoms: I sent in a prescription for flonase- you can use one spray in each nare daily, you can also take 1 Zyrtec at night. Please be aware that Zyrtec can make you sleepy.   - For substance use: Please call [facility]. They have a methadone clinic that may be able to provide additional support. Please be aware that their hours are 5 am to 10 am.   [facility] Address: [address] [place], [place], [place] [address] Phone: [phone]  - I will call you with the results of your labs today and any next steps as needed.   Should you have any questions or concerns please call the internal medicine clinic at [phone].     Vincent Gilardi Colbert Coyer, MD PGY-1 Internal Medicine Teaching Progam [facility]
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
## 2023-06-12 LAB — CBC WITH DIFFERENTIAL/PLATELET
Basophils Absolute: 0.1 10*3/uL (ref 0.0–0.2)
Basos: 1 %
EOS (ABSOLUTE): 0.3 10*3/uL (ref 0.0–0.4)
Eos: 2 %
Hematocrit: 43.4 % (ref 37.5–51.0)
Hemoglobin: 14.9 g/dL (ref 13.0–17.7)
Immature Grans (Abs): 0 10*3/uL (ref 0.0–0.1)
Immature Granulocytes: 0 %
Lymphocytes Absolute: 2.4 10*3/uL (ref 0.7–3.1)
Lymphs: 23 %
MCH: 30 pg (ref 26.6–33.0)
MCHC: 34.3 g/dL (ref 31.5–35.7)
MCV: 87 fL (ref 79–97)
Monocytes Absolute: 0.9 10*3/uL (ref 0.1–0.9)
Monocytes: 9 %
Neutrophils Absolute: 6.8 10*3/uL (ref 1.4–7.0)
Neutrophils: 65 %
Platelets: 362 10*3/uL (ref 150–450)
RBC: 4.97 x10E6/uL (ref 4.14–5.80)
RDW: 13.2 % (ref 11.6–15.4)
WBC: 10.4 10*3/uL (ref 3.4–10.8)

## 2023-06-12 LAB — CMP14 + ANION GAP
ALT: 18 [IU]/L (ref 0–44)
AST: 14 [IU]/L (ref 0–40)
Albumin: 4.2 g/dL (ref 3.8–4.9)
Alkaline Phosphatase: 103 [IU]/L (ref 44–121)
Anion Gap: 15 mmol/L (ref 10.0–18.0)
BUN/Creatinine Ratio: 12 (ref 9–20)
BUN: 9 mg/dL (ref 6–24)
Bilirubin Total: <0.2 mg/dL (ref 0.0–1.2)
CO2: 23 mmol/L (ref 20–29)
Calcium: 9.8 mg/dL (ref 8.7–10.2)
Chloride: 100 mmol/L (ref 96–106)
Creatinine, Ser: 0.76 mg/dL (ref 0.76–1.27)
Globulin, Total: 2.8 g/dL (ref 1.5–4.5)
Glucose: 99 mg/dL (ref 70–99)
Potassium: 4.3 mmol/L (ref 3.5–5.2)
Sodium: 138 mmol/L (ref 134–144)
Total Protein: 7 g/dL (ref 6.0–8.5)
eGFR: 104 mL/min/{1.73_m2} (ref >59)

## 2023-06-12 LAB — IRON,TIBC AND FERRITIN PANEL
Ferritin: 61 ng/mL (ref 30–400)
Iron Saturation: 15 % (ref 15–55)
Iron: 57 ug/dL (ref 38–169)
Total Iron Binding Capacity: 369 ug/dL (ref 250–450)
UIBC: 312 ug/dL (ref 111–343)

## 2023-06-13 DIAGNOSIS — J069 Acute upper respiratory infection, unspecified: Secondary | ICD-10-CM | POA: Insufficient documentation

## 2023-06-13 MED ORDER — PANTOPRAZOLE SODIUM 40 MG PO TBEC
40.0000 mg | DELAYED_RELEASE_TABLET | Freq: Two times a day (BID) | ORAL | 5 refills | Status: DC
  Filled 2023-06-13 – 2023-07-08 (×2): qty 60, 30d supply, fill #0
  Filled 2023-08-06: qty 60, 30d supply, fill #1
  Filled 2023-09-08: qty 60, 30d supply, fill #2
  Filled 2023-10-07: qty 60, 30d supply, fill #3
  Filled 2023-11-09: qty 60, 30d supply, fill #4
  Filled 2023-12-10: qty 60, 30d supply, fill #5

## 2023-06-13 NOTE — Assessment & Plan Note (Signed)
Patient previously followed with [facility] for suboxone refills. Due to Toxassure positive for heroin, fentanyl, and no metabolite for suboxone, patient was asked to establish at outside methadone clinic in June 2024. Per patient, he did not reach out to any clinics. Has been snorting heroin. Denied any present or former IV injection use. States he is interested in establishing with methadone clinic, provided information for [facility] today.  Plan - Patient to reach out to [facility] to re-establish care at methadone clinic

## 2023-06-13 NOTE — Assessment & Plan Note (Addendum)
Patient with history of esophageal stricture s/p dilation and stenting, Barret's esophagus, and hiatal hernia. Patient has not followed up with [facility] in some time. PPI BID still helpful to help control symptoms of reflux.  Plan - Continue pantoprazole 40 mg BID, refilled today  - OTC famotidine 20 mg daily at bedtime  - Patient to reach out to [facility] for regular follow up and colonoscopy

## 2023-06-13 NOTE — Assessment & Plan Note (Addendum)
BP 114/82. Patient reports taking lisinopril-hydrochlorothiazide 10-12 mg daily, tolerating well. Denies chest pain, heart palpitations, or lower extremity swelling. CV exam unremarkable. Will get labs today.  Plan - Continue  lisinopril-hydrochlorothiazide 10-12 mg daily, refilled today - CMP today, follow up as needed

## 2023-06-13 NOTE — Assessment & Plan Note (Addendum)
Patient presents with a few weeks of congestion, rhinorrhea, sporadic cough, and fatigue. Has been urinating more frequently but denies dysuria, hematuria, or weight changes. States his wife had a URI a few weeks ago and was recently hospitalized for pneumonia. Denies fever, shortness of breath, pain with swallowing, ear ache, headache, or diarrhea. Did not get prior testing for URI. Endorses fatigue as most bothersome symptom. 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 Vitals stable, physical exam notable for rhinorrhea and upper airway congestion. Pulmonary exam normal, no rashes, wheezing, or lymphadenopathy observed. Based on timeline, patient likely presenting with resolving URI and post-viral fatigue. Low suspicion for serious infection such as strep throat or pneumonia. Discussed getting labs and treating symptomatically, patient agreeable.  Plan - For symptom relief: cetirizine 10 mg daily at bedtime, flonase nasal spray daily - CBC, iron panel, Hgb A1c

## 2023-06-13 NOTE — Assessment & Plan Note (Signed)
Patient with history of insomnia. Taking trazodone 100 mg daily at bedtime. Denies any issues with medication.  Plan - Continue trazodone 100 mg daily at bedtime, refilled today.

## 2023-06-15 ENCOUNTER — Telehealth: Payer: Self-pay | Admitting: Internal Medicine

## 2023-06-15 ENCOUNTER — Other Ambulatory Visit (HOSPITAL_COMMUNITY): Payer: Self-pay

## 2023-06-15 NOTE — Progress Notes (Signed)
 Internal Medicine Clinic Attending  Case discussed with the resident at the time of the visit.  We reviewed the resident's history and exam and pertinent patient test results.  I agree with the assessment, diagnosis, and plan of care documented in the resident's note.

## 2023-06-15 NOTE — Telephone Encounter (Signed)
Pt requesting a call back about his Lab Results for Visit 06/11/2023.

## 2023-06-15 NOTE — Addendum Note (Signed)
Addended by: Dickie La on: 06/15/2023 01:32 PM   Modules accepted: Level of Service

## 2023-06-18 ENCOUNTER — Encounter: Payer: Medicaid Other | Admitting: Student

## 2023-06-29 ENCOUNTER — Ambulatory Visit: Payer: Medicaid Other | Admitting: Student

## 2023-06-29 ENCOUNTER — Other Ambulatory Visit (HOSPITAL_COMMUNITY): Payer: Self-pay

## 2023-06-29 DIAGNOSIS — F17200 Nicotine dependence, unspecified, uncomplicated: Secondary | ICD-10-CM

## 2023-06-29 DIAGNOSIS — F17210 Nicotine dependence, cigarettes, uncomplicated: Secondary | ICD-10-CM

## 2023-06-29 MED ORDER — NICOTINE 21 MG/24HR TD PT24
21.0000 mg | MEDICATED_PATCH | Freq: Every day | TRANSDERMAL | 2 refills | Status: AC
  Filled 2023-06-29 – 2023-07-09 (×2): qty 28, 28d supply, fill #0

## 2023-06-29 NOTE — Assessment & Plan Note (Signed)
Patient continues to smoke about 1 pack/day but during recent hospitalization was put on nicotine patch and had no desire to smoke.  He is interested in smoking cessation and would like to do the nicotine patch again.  Will start 21 mg nicotine patch daily for 4-6 weeks with plans to titrate down to 14 mg and then 7 mg for at least 2 weeks at a time.  He has a follow-up in about 6 weeks and at that time we will discuss decreasing the dose. - Start 21 mg nicotine patch daily and he can call to get nicotine gum if needed
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
## 2023-07-02 ENCOUNTER — Other Ambulatory Visit (HOSPITAL_COMMUNITY): Payer: Self-pay

## 2023-07-06 ENCOUNTER — Other Ambulatory Visit (HOSPITAL_COMMUNITY): Payer: Self-pay

## 2023-07-08 ENCOUNTER — Other Ambulatory Visit (HOSPITAL_COMMUNITY): Payer: Self-pay

## 2023-07-08 NOTE — Progress Notes (Signed)
 Internal Medicine Clinic Attending  Case discussed with the resident at the time of the visit.  We reviewed the resident's history and exam and pertinent patient test results.  I agree with the assessment, diagnosis, and plan of care documented in the resident's note.

## 2023-07-09 ENCOUNTER — Other Ambulatory Visit (HOSPITAL_COMMUNITY): Payer: Self-pay

## 2023-07-09 ENCOUNTER — Other Ambulatory Visit: Payer: Self-pay

## 2023-08-03 ENCOUNTER — Encounter: Payer: Medicaid Other | Admitting: Internal Medicine

## 2023-08-06 ENCOUNTER — Other Ambulatory Visit: Payer: Self-pay

## 2023-08-06 ENCOUNTER — Other Ambulatory Visit (HOSPITAL_COMMUNITY): Payer: Self-pay

## 2023-08-06 ENCOUNTER — Other Ambulatory Visit: Payer: Self-pay | Admitting: Student

## 2023-08-06 DIAGNOSIS — F419 Anxiety disorder, unspecified: Secondary | ICD-10-CM

## 2023-08-07 ENCOUNTER — Other Ambulatory Visit (HOSPITAL_COMMUNITY): Payer: Self-pay

## 2023-08-08 MED ORDER — TRAZODONE HCL 100 MG PO TABS
100.0000 mg | ORAL_TABLET | Freq: Every day | ORAL | 0 refills | Status: DC
  Filled 2023-08-08: qty 30, 30d supply, fill #0

## 2023-08-10 ENCOUNTER — Other Ambulatory Visit (HOSPITAL_COMMUNITY): Payer: Self-pay

## 2023-08-14 ENCOUNTER — Other Ambulatory Visit (HOSPITAL_COMMUNITY): Payer: Self-pay

## 2023-09-08 ENCOUNTER — Other Ambulatory Visit (HOSPITAL_COMMUNITY): Payer: Self-pay

## 2023-10-07 ENCOUNTER — Other Ambulatory Visit (HOSPITAL_COMMUNITY): Payer: Self-pay

## 2023-10-07 ENCOUNTER — Other Ambulatory Visit: Payer: Self-pay | Admitting: Internal Medicine

## 2023-10-07 DIAGNOSIS — F419 Anxiety disorder, unspecified: Secondary | ICD-10-CM

## 2023-10-07 MED ORDER — TRAZODONE HCL 100 MG PO TABS
100.0000 mg | ORAL_TABLET | Freq: Every day | ORAL | 0 refills | Status: DC
  Filled 2023-10-07: qty 30, 30d supply, fill #0

## 2023-11-09 ENCOUNTER — Other Ambulatory Visit: Payer: Self-pay

## 2023-11-09 ENCOUNTER — Other Ambulatory Visit (HOSPITAL_COMMUNITY): Payer: Self-pay

## 2023-11-11 ENCOUNTER — Other Ambulatory Visit (HOSPITAL_COMMUNITY): Payer: Self-pay

## 2023-11-16 ENCOUNTER — Other Ambulatory Visit (HOSPITAL_COMMUNITY): Payer: Self-pay

## 2023-12-10 ENCOUNTER — Other Ambulatory Visit (HOSPITAL_COMMUNITY): Payer: Self-pay

## 2023-12-10 ENCOUNTER — Other Ambulatory Visit: Payer: Self-pay

## 2023-12-14 ENCOUNTER — Other Ambulatory Visit (HOSPITAL_COMMUNITY): Payer: Self-pay

## 2023-12-14 ENCOUNTER — Other Ambulatory Visit: Payer: Self-pay

## 2023-12-14 DIAGNOSIS — F419 Anxiety disorder, unspecified: Secondary | ICD-10-CM

## 2023-12-14 MED ORDER — TRAZODONE HCL 100 MG PO TABS
100.0000 mg | ORAL_TABLET | Freq: Every day | ORAL | 0 refills | Status: DC
  Filled 2023-12-14: qty 30, 30d supply, fill #0

## 2023-12-15 ENCOUNTER — Other Ambulatory Visit (HOSPITAL_COMMUNITY): Payer: Self-pay

## 2023-12-29 ENCOUNTER — Other Ambulatory Visit (HOSPITAL_COMMUNITY): Payer: Self-pay

## 2023-12-31 ENCOUNTER — Other Ambulatory Visit (HOSPITAL_COMMUNITY): Payer: Self-pay

## 2024-01-08 ENCOUNTER — Other Ambulatory Visit (HOSPITAL_COMMUNITY): Payer: Self-pay

## 2024-01-13 ENCOUNTER — Other Ambulatory Visit: Payer: Self-pay | Admitting: Student

## 2024-01-13 ENCOUNTER — Other Ambulatory Visit (HOSPITAL_COMMUNITY): Payer: Self-pay

## 2024-01-13 DIAGNOSIS — K219 Gastro-esophageal reflux disease without esophagitis: Secondary | ICD-10-CM

## 2024-01-13 MED ORDER — PANTOPRAZOLE SODIUM 40 MG PO TBEC
40.0000 mg | DELAYED_RELEASE_TABLET | Freq: Two times a day (BID) | ORAL | 5 refills | Status: AC
  Filled 2024-01-13: qty 60, 30d supply, fill #0
  Filled 2024-02-17: qty 60, 30d supply, fill #1
  Filled 2024-03-25: qty 60, 30d supply, fill #2

## 2024-01-13 NOTE — Telephone Encounter (Signed)
 Medication sent to pharmacy

## 2024-01-15 ENCOUNTER — Other Ambulatory Visit (HOSPITAL_COMMUNITY): Payer: Self-pay

## 2024-02-09 ENCOUNTER — Other Ambulatory Visit (HOSPITAL_BASED_OUTPATIENT_CLINIC_OR_DEPARTMENT_OTHER): Payer: Self-pay

## 2024-02-09 ENCOUNTER — Other Ambulatory Visit (HOSPITAL_COMMUNITY): Payer: Self-pay

## 2024-02-09 ENCOUNTER — Other Ambulatory Visit: Payer: Self-pay | Admitting: Student

## 2024-02-09 DIAGNOSIS — I10 Essential (primary) hypertension: Secondary | ICD-10-CM

## 2024-02-09 MED ORDER — LISINOPRIL-HYDROCHLOROTHIAZIDE 10-12.5 MG PO TABS
1.0000 | ORAL_TABLET | Freq: Every day | ORAL | 0 refills | Status: AC
  Filled 2024-02-09: qty 60, 60d supply, fill #0

## 2024-02-10 ENCOUNTER — Other Ambulatory Visit (HOSPITAL_BASED_OUTPATIENT_CLINIC_OR_DEPARTMENT_OTHER): Payer: Self-pay

## 2024-02-10 ENCOUNTER — Other Ambulatory Visit (HOSPITAL_COMMUNITY): Payer: Self-pay

## 2024-02-15 ENCOUNTER — Other Ambulatory Visit (HOSPITAL_COMMUNITY): Payer: Self-pay

## 2024-02-16 ENCOUNTER — Other Ambulatory Visit: Payer: Self-pay

## 2024-02-17 ENCOUNTER — Other Ambulatory Visit (HOSPITAL_COMMUNITY): Payer: Self-pay

## 2024-02-18 ENCOUNTER — Other Ambulatory Visit (HOSPITAL_COMMUNITY): Payer: Self-pay

## 2024-03-21 ENCOUNTER — Ambulatory Visit: Admitting: Student
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
## 2024-03-24 ENCOUNTER — Other Ambulatory Visit: Payer: Self-pay

## 2024-03-24 ENCOUNTER — Other Ambulatory Visit (HOSPITAL_COMMUNITY): Payer: Self-pay

## 2024-03-24 ENCOUNTER — Other Ambulatory Visit: Payer: Self-pay | Admitting: Student

## 2024-03-24 DIAGNOSIS — F419 Anxiety disorder, unspecified: Secondary | ICD-10-CM

## 2024-03-24 MED ORDER — TRAZODONE HCL 100 MG PO TABS
100.0000 mg | ORAL_TABLET | Freq: Every day | ORAL | 0 refills | Status: AC
  Filled 2024-03-24: qty 30, 30d supply, fill #0

## 2024-03-25 ENCOUNTER — Other Ambulatory Visit (HOSPITAL_COMMUNITY): Payer: Self-pay

## 2024-03-25 ENCOUNTER — Telehealth: Payer: Self-pay

## 2024-03-25 DIAGNOSIS — K219 Gastro-esophageal reflux disease without esophagitis: Secondary | ICD-10-CM

## 2024-03-25 DIAGNOSIS — F419 Anxiety disorder, unspecified: Secondary | ICD-10-CM

## 2024-03-25 NOTE — Telephone Encounter (Signed)
 Copied from CRM [id number]. Topic: Clinical - Medication Refill >> Mar 25, 2024  9:04 AM Diannia H wrote: Medication: traZODone  (DESYREL ) 100 MG tablet, pantoprazole  (PROTONIX ) 40 MG tablet  Has the patient contacted their pharmacy? Yes (Agent: If no, request that the patient contact the pharmacy for the refill. If patient does not wish to contact the pharmacy document the reason why and proceed with request.) (Agent: If yes, when and what did the pharmacy advise?)  This is the patient's preferred pharmacy:  [facility] [address], [address] [place] [address] Phone: [phone] Fax: [phone]  Is this the correct pharmacy for this prescription? Yes If no, delete pharmacy and type the correct one.   Has the prescription been filled recently? No  Is the patient out of the medication? Yes  Has the patient been seen for an appointment in the last year OR does the patient have an upcoming appointment? Yes  Can we respond through MyChart? Yes  Agent: Please be advised that Rx refills may take up to 3 business days. We ask that you follow-up with your pharmacy.

## 2024-03-25 NOTE — Telephone Encounter (Signed)
 Called and informed pt per the call center, his refills are ready for pick-up.

## 2024-03-25 NOTE — Telephone Encounter (Signed)
 [facility] called and spoke to [place], [facility] about the refill(s)  requested. Stated that refills are ready for pickup.

## 2024-03-29 ENCOUNTER — Ambulatory Visit: Payer: Self-pay

## 2024-04-26 ENCOUNTER — Telehealth: Payer: Self-pay | Admitting: *Deleted

## 2024-04-26 NOTE — Telephone Encounter (Signed)
 Called pt again no answer

## 2024-04-26 NOTE — Telephone Encounter (Signed)
 Called pt again; no answer, call cannot be completed.

## 2024-04-26 NOTE — Telephone Encounter (Signed)
 Copied from CRM #[id number]. Topic: Clinical - Prescription Issue >> Apr 26, 2024 11:38 AM Cherylann RAMAN wrote: Reason for CRM: Patient requesting a medication refill. As he missed his last appt due to accident. Reached out to [facility] and was informed patient is not able to get a refill until after he has been seen by a provider. As patient has No Showed to last 2 appts and was last seen in 02/25. Informed patient and stated that there was an appt for today but patient declined stating he did not have any transportation. Offered to schedule him for a different day patient stated that he didn't want to schedule first before he has transportation. Patient stated he would callback after he figured out transportation. In addition, patient stated he has been out of his medication for 2 days. Advised he still needed an appt in order to obtain refill. He still declined appt

## 2024-04-26 NOTE — Telephone Encounter (Signed)
 Called pt to find out which med he needs a refill - no answer. Call cannot be complete, unable to leave a message.

## 2024-06-15 NOTE — Progress Notes (Signed)
 No show
